# Patient Record
Sex: Male | Born: 1970 | Race: Black or African American | Hispanic: No | Marital: Married | Smoking: Former smoker
Health system: Southern US, Community
[De-identification: ages and names within clinical notes are randomized; demographics above are authoritative.]

## PROBLEM LIST (undated history)

## (undated) DIAGNOSIS — B182 Chronic viral hepatitis C: Secondary | ICD-10-CM

## (undated) DIAGNOSIS — I639 Cerebral infarction, unspecified: Secondary | ICD-10-CM

## (undated) DIAGNOSIS — K219 Gastro-esophageal reflux disease without esophagitis: Secondary | ICD-10-CM

## (undated) DIAGNOSIS — F1920 Other psychoactive substance dependence, uncomplicated: Secondary | ICD-10-CM

## (undated) DIAGNOSIS — E7800 Pure hypercholesterolemia, unspecified: Secondary | ICD-10-CM

## (undated) DIAGNOSIS — F319 Bipolar disorder, unspecified: Secondary | ICD-10-CM

## (undated) DIAGNOSIS — I10 Essential (primary) hypertension: Secondary | ICD-10-CM

## (undated) DIAGNOSIS — F419 Anxiety disorder, unspecified: Secondary | ICD-10-CM

## (undated) DIAGNOSIS — F1020 Alcohol dependence, uncomplicated: Secondary | ICD-10-CM

## (undated) HISTORY — DX: Bipolar disorder, unspecified: F31.9

## (undated) HISTORY — PX: ANEURYSM COILING: SHX5349

## (undated) HISTORY — DX: Alcohol dependence, uncomplicated: F10.20

## (undated) HISTORY — PX: APPENDECTOMY: SHX54

## (undated) HISTORY — DX: Other psychoactive substance dependence, uncomplicated: F19.20

## (undated) HISTORY — PX: LAPAROSCOPIC APPENDECTOMY: SUR753

## (undated) HISTORY — DX: Chronic viral hepatitis C: B18.2

## (undated) HISTORY — DX: Gastro-esophageal reflux disease without esophagitis: K21.9

## (undated) MED FILL — Fosaprepitant Dimeglumine For IV Infusion 150 MG (Base Eq): INTRAVENOUS | Qty: 5 | Status: AC

---

## 2002-11-23 ENCOUNTER — Emergency Department (HOSPITAL_COMMUNITY): Admission: EM | Admit: 2002-11-23 | Discharge: 2002-11-23 | Payer: Self-pay | Admitting: Emergency Medicine

## 2003-11-07 ENCOUNTER — Emergency Department (HOSPITAL_COMMUNITY): Admission: EM | Admit: 2003-11-07 | Discharge: 2003-11-07 | Payer: Self-pay | Admitting: Emergency Medicine

## 2006-03-24 ENCOUNTER — Inpatient Hospital Stay (HOSPITAL_COMMUNITY): Admission: AD | Admit: 2006-03-24 | Discharge: 2006-03-27 | Payer: Self-pay | Admitting: Neurosurgery

## 2006-03-29 ENCOUNTER — Inpatient Hospital Stay (HOSPITAL_COMMUNITY): Admission: EM | Admit: 2006-03-29 | Discharge: 2006-04-01 | Payer: Self-pay | Admitting: Emergency Medicine

## 2010-04-20 ENCOUNTER — Encounter: Payer: Self-pay | Admitting: Neurosurgery

## 2010-08-15 NOTE — Discharge Summary (Signed)
NAMETIMONTHY, HOVATER                ACCOUNT NO.:  [id number]   MEDICAL RECORD NO.:  [id number]          PATIENT TYPE:  INP   LOCATION:  3110                         FACILITY:  [facility]   PHYSICIAN:  Danae Orleans. Venetia Maxon, M.D.  DATE OF BIRTH:  November 25, 1970   DATE OF ADMISSION:  03/24/2006  DATE OF DISCHARGE:  03/27/2006                               DISCHARGE SUMMARY   REASON FOR ADMISSION:  Subarachnoid hemorrhage.   FINAL DIAGNOSIS:  Subarachnoid hemorrhage with angiographically negative  subarachnoid hemorrhage.   HISTORY OF ILLNESS:  [name] is a 40 year old man who has had  trouble with his blood pressure in the past.  He was seen at [facility] a couple of months ago secondary to increase in blood pressure  and headache.  He was seen by his medical doctor, Dr. Clelia Croft, and treated  with medications including clonidine and Diovan.  On the morning of  December 26, he awoke at around 11 a.m., was preparing to take his blood  pressure medicine when he had an acute onset of a headache with several  episodes of nausea and vomiting.  The patient called the emergency  services and was taken to [facility].  A CT scan demonstrated a  subarachnoid hemorrhage and a neurosurgical consultation was requested.  The patient was transferred to [facility] for  further neurosurgical management.  The CT scan revealed that he has  nontraumatic subarachnoid hemorrhage.  The CT scan demonstrated  subarachnoid blood mainly in the right sylvian fissure and some over the  convexity.  He underwent an angiogram, which was negative.  On December,  26, 2007 without evidence of aneurysm, AVM, dural AV fistula,  dissections or occlusions.  The patient was then observed in the neuro  ICU where he was doing well without significant headache complaints and  his blood pressure was well controlled.  He was discharged home on the  morning on March 27, 2006 after a head CT showed no  evidence of any  new hemorrhage and again, he was doing well.  He was on a Decadron taper  and was discharged with a prescription for Percocet #40 with  instructions to follow up for repeat angiogram on April 09, 2006.  He  is to be out of work for the next two weeks.      Danae Orleans. Venetia Maxon, M.D.  Electronically Signed     JDS/MEDQ  D:  03/27/2006  T:  03/28/2006  Job:  [id number]   cc:   Dr. Clelia Croft

## 2010-08-15 NOTE — H&P (Signed)
[name], COLL NO.:  [id number]   MEDICAL RECORD NO.:  [id number]          PATIENT TYPE:  INP   LOCATION:  3112                         FACILITY:  [facility]   PHYSICIAN:  Hewitt Shorts, M.D.DATE OF BIRTH:  06-19-1970   DATE OF ADMISSION:  03/29/2006  DATE OF DISCHARGE:                              HISTORY & PHYSICAL   HISTORY OF PRESENT ILLNESS:  The patient is a 40 year old black male who  returned to the [facility] emergency room by EMS this evening.  He had been admitted by Dr. Delma Officer in transfer from [facility] on March 24, 2006.  He is diagnosed with a  subretinal hemorrhage.  He presented with severe headache, and Dr.  Lovell Sheehan obtained an arteriogram that was performed by Dr. Corliss Skains.  Dr. Corliss Skains described that there is angiographically no evidence of  aneurysm, AVM, dural AV fistula dissection, or occlusions.  He felt the  venous outflow was within normal limits, and he felt there was mild  scattered atherosclerotic changes.  The patient remained neurologically  stable with only mild headache.  A repeat CT of the brain was performed,  and the patient was discharged on March 27, 2006, by Dr. Venetia Maxon with  plans for a repeat arteriogram on April 09, 2006.   However, earlier this evening, the patient was awakened from sleep.  He  felt a pop in his head and then the onset of pain, including  bifrontotemporal as well as vertex headache as well as nuchal pain.  This was associated with photophobia and occasional blurred vision;  however, the patient denies any focal weakness, diplopia, nausea, or  vomiting.   The patient was evaluated by Dr. Maryanna Shape, the emergency room  physician.  CT of the brain without contrast was obtained, and this  showed increased subretinal hemorrhage over the right frontal convexity,  essentially within the sulci of the right frontal lobe, without blood in  the area of the  basal cisterns.  The blood in the right frontal  convexity region is increased in appearance as compared to the CT done  on the 29th, indicating probable re-hemorrhage, although one could not  absolutely exclude differences in the CT technique.   The original CT scan from March 24, 2006, was performed at [facility].  It is unavailable to [place].   Neurosurgical consultation was requested by Dr. Weldon Inches.   PAST MEDICAL HISTORY:  Notable for history of hypertension for the past  10 to 15 years, treated by his primary physician, Dr. Sherryll Burger, in [place].  He  has been using Diovan/hydrochlorothiazide 160/12.5.  Dr. Venetia Maxon had given  him a new prescription for this, which the patient did fill.  He also  has a remote history of peptic ulcer disease 10 years ago.  It has not  been a recent problem.  He does not describe any history of myocardial  infarction, cancer, diabetes, or lung disease, and he does not report a  history of stroke prior to the difficulties this past week.   Previous surgeries include an appendectomy about  20 years ago.  He  denies allergies to medications.   MEDICATIONS:  At this time include:  1. Diovan/hydrochlorothiazide 160/12.5 one tablet q.a.m.  2. He was also given prescriptions for Percocet and Decadron by Dr.      Venetia Maxon at the time of discharge.   FAMILY HISTORY:  His father is aged 24 with Alzheimer's and has some  other medical conditions that he is unsure of.  Mother died at age 40  with lung cancer.  She also had diabetes and had a history of myocardial  infarction.   SOCIAL HISTORY:  The patient is married.  He has 6 children.  He lives  in [place].  He is employed in a Restaurant manager, [employer].  He smokes and  has smoked for 20 years; he has not smoked though, since his  hospitalization on the 26th.  He drinks beer about 3 times a week.  He  has not used any street drugs recently, but he used marijuana last about  4 months ago.   REVIEW  OF SYSTEMS:  Notable for symptoms described.   PAST MEDICAL HISTORY:  Otherwise unremarkable.   PHYSICAL EXAMINATION:  The patient is a well-developed, well-nourished  black male in no acute distress.  Temperature 97.5, pulse 56, blood pressure of 158/83.  LUNGS:  Clear to auscultation. Asymmetrical respiratory excursion.  HEART:  Sinus bradycardia but a normal S1 and S2.  There is no murmur.  ABDOMEN:  Soft, nondistended, bowel sounds present.  EXTREMITIES:  Show no clubbing, cyanosis, or edema.  NECK EXAMINATION:  1 to 2+ meningismus.  NEUROLOGICAL EXAMINATION:  Mental status:  The patient is awake, alert,  fully oriented.  His speech is fluent.  He has good comprehension.  Cranial nerves show pupils are equal, round, and reactive to light.  Extraocular movements are intact.  Facial movements symmetrical.  Hearing is present bilaterally.  Palate movement is symmetrical.  Shoulder shrug is symmetrical and tongue is midline.  Motor examination  shows 5/5 strength of the upper and lower extremities.  He has no drift  of the upper extremities.  Sensation is intact to pinprick throughout  the extremities.  Reflexes are trace to 1 in the biceps,  brachioradialis, triceps, quadriceps and gastrocnemiuou symmetrical.  Toes are downgoing bilaterally.  Gait and stance are not tested due to  his medical condition at this time.   IMPRESSION:  Subretinal hemorrhage, etiology uncertain.  Angio on  December 26 was negative.   PLAN:  The patient will be admitted to the neurosurgical intensive care  unit to the service of Dr. Lovell Sheehan.  Will continue neuro checks.  Laboratories have been requested, including a CBC with differential, PT,  PTT, CMET, urinalysis, urine drug screen, and typing screen.  Will  obtain MRI of the brain with and without contrast and an MRA of the  brain.  The patient will be restarted on Nimotop, which had been initiated by Dr. Lovell Sheehan at the time of admission on the 26th  but not  continued at the time of discharge on the 29th.  Decadron and Pepcid  will be reinitiated, and the patient will be continued on his  Diovan/hydrochlorothiazide.   I spoke with the patient and his wife about his condition and the  uncertainty of the cause of his hemorrhage and plans for ongoing workup  and evaluation.      Hewitt Shorts, M.D.  Electronically Signed     RWN/MEDQ  D:  03/29/2006  T:  03/29/2006  Job:  [id number]

## 2010-08-15 NOTE — Consult Note (Signed)
[name], [name]                ACCOUNT NO.:  [id number]   MEDICAL RECORD NO.:  [id number]          PATIENT TYPE:  INP   LOCATION:  3112                         FACILITY:  [facility]   PHYSICIAN:  Casimiro Needle L. Reynolds, M.D.DATE OF BIRTH:  1970-11-28   DATE OF CONSULTATION:  DATE OF DISCHARGE:                                 CONSULTATION   DATE OF EVALUATION:  March 29, 2006   REQUESTING PHYSICIAN:  Dr. Newell Coral   REASON FOR EVALUATION:  Possible vasculitis.   HISTORY OF PRESENT ILLNESS:  This is the initial inpatient consultation  evaluation of this 40 year old man with past medical history as outlined  below.  Patient was initially admitted to [facility] on  December 26 after presenting to [facility] with a headache and  blurry vision.  CT at that time demonstrated a subarachnoid hemorrhage  with blood in the right frontal area.  He was transferred to [facility] and underwent angiography which did not demonstrate aneurysm,  was basically otherwise negative.  He was observed for a couple of days,  remained stable and was discharged on December 29.  Unfortunately, I am  not able to recover any records from that hospitalization.  He was  readmitted in the early hours this morning.  He says that on the evening  of December 30, he awakened from a dream having a pop in his head and  having worsening of his headache and double vision just as he had a few  days prior.  In the emergency room, he underwent CT of the head which  showed an increase in the size of the blood over the right frontal  convexity as compared to the CT which was done on December 29.  Later  today, he had an MRI of the brain which showed the same finding and also  demonstrated some new subarachnoid blood over the occipital areas.  MRA  performed at that time was read out as showing some narrowings in the  MCA and PCA branches raising the possibility of atherosclerotic disease  versus  vasculitis.  There were no abnormalities in the brain parenchyma.  Neurologic consultation was requested for consideration of possible  vasculitis.  Patient says that his symptoms which were severe this  morning are gradually improving again, he still has a little bit of a  headache and double vision.  He says that he was generally weak, but  denied having any focal weakness at any point.  He knows that his speech  was slurred, but that seems to be getting better.  On specific  questioning, he does report that he has had intermittent rashes which he  had  described previously to working around chemicals, but over the past  couple of weeks he had a specific rash across the bridge of his nose.  He also endorses having other symptoms at various times including  swelling and redness of various joints including most recently the knees  a couple of weeks ago as well as bouts of hemoptysis and hematochezia.  He states that he has seen his primary care  doctor in [facility]  about this, but he has never been referred to a rheumatologist.   PAST MEDICAL HISTORY:  1. He denies chronic medical problems.  2. He says at one point there was concern he might have meningitis.  3. He was treated for pneumonia in the past as well.  4. He has known hypertension for which he is on medications.  5. He had an appendectomy 22 years ago.   FAMILY/SOCIAL/REVIEW OF SYSTEMS:  As outlined in the admission H&P of  March 29, 2006 by Dr. Newell Coral which is reviewed.   PHYSICAL EXAMINATION:  VITAL SIGNS:  Temperature 97.5, blood pressure  158/83, pulse 56, respirations 16.  GENERAL EXAMINATION:  This is a  healthy-appearing man seated in no evident distress.  HEAD:  Cranium is normocephalic, atraumatic.  OROPHARYNX:  Benign.  NECK:  Supple without carotid or supraclavicular bruits.  HEART:  Regular rate without murmurs.  EXTREMITIES:  No rashes are noted, no swelling or redness of the joints  are noted.   He does have a couple of small subcutaneous nodules over the  extensor surface of the right forearm.  NEUROLOGIC EXAM:  Mental status:  He is awake and alert, he is fully  oriented to time and place.  Recent and remote memory are intact per  history given attention span, concentration and fund of knowledge are  all adequate.  He has no defects to naming or repeating phrases.  Cranial nerves:  Funduscopic exam is benign.  Pupils are equal, brisk  and reactive.  Extraocular movements full without nystagmus.  Visual  fields full to confrontation.  Hearing is intact to conversational  speech.  Face, tongue and palate move normally and symmetrical.  Motor:  Normal bulk and tone.  Normal strength in all tested extremity muscles.  Sensation:  Intact to light touch throughout.  Coordination and rapid  movements performed adequately.  Finger-to-nose performed adequate.  Gait:  He failed to ambulate normally.  Reflexes 2+ and symmetric.  Toes  were downgoing bilaterally.   LABORATORY REVIEW:  MRI of the brain performed earlier today is  reviewed, study demonstrates normal brain parenchyma with some suspected  narrowing of branches of MCA and PCA territories.  I also briefly  reviewed the angiogram performed on March 24, 2006 which is negative  for aneurysms or major vessel occlusions and to my eyes not highly  suggestive of vasculitis.   IMPRESSION:  Multiple subarachnoid hemorrhages without aneurysmal  source.  He had a recent catheter angiogram which was not highly  suggestive of  vasculitis.  His MRI does not show multiple strokes or  ischemia which would be expected in a patient with vasculitis and the MR  angiogram which he had to take can overcall these findings.  On the  other hand, he does endorse several symptoms including intermittent  rashes, arthritis and hemoptysis which suggest that he might, in fact,  have a vasculitic disorder, particularly one affecting the adventitia of the  vessels could present with multiple subarachnoid hemorrhages of this  nature.   RECOMMENDATIONS:  Would suggest laboratory workup including  sedimentation rate, C-reactive protein, ANA, rheumatoid factor and C and  P-ANCA antibodies.  Would also suggest a rheumatology evaluation, as  given his multiple systemic symptoms it is likely that if he does have a  CNS vasculitis, it is secondary to a more diffuse vasculitic disorder.  Pending the outcome of the above, other considerations could be  undertaken including a repeat angiogram which if  it confirms the MRA  findings would be very suggestive of vasculitis, possibly LP or biopsy  of appropriate tissue if other considerations are undertaken.  I do not  think that a brain biopsy is indicated at this time and that would  likely be the only way to absolute confirm the diagnosis of CNS  vasculitis.  I will follow with you.   Thank you for the consultation.      Michael L. Thad Ranger, M.D.  Electronically Signed     MLR/MEDQ  D:  03/29/2006  T:  03/30/2006  Job:  [id number]

## 2010-08-15 NOTE — H&P (Signed)
[name], [name]                ACCOUNT NO.:  [id number]   MEDICAL RECORD NO.:  [id number]          PATIENT TYPE:  INP   LOCATION:  3110                         FACILITY:  [facility]   PHYSICIAN:  Cristi Loron, M.D.DATE OF BIRTH:  [birth year]   DATE OF ADMISSION:  03/24/2006  DATE OF DISCHARGE:                              HISTORY & PHYSICAL   CHIEF COMPLAINT:  Headache.   HISTORY OF PRESENT ILLNESS:  The patient is a 40 year old black male,  who had some trouble with his blood pressure in the past.  He was seen  at the [facility] Emergency Department a couple of months ago  secondary to a increase in blood pressure and headache.  He has been  seen by his medical doctor, Dr. Clelia Croft, and treated with medications  including clonidine and Diovan.   This morning, the patient woke up around 11:00.  He was preparing to  take his blood pressure medicine when he had an acute onset of a  headache with several episodes of nausea and vomiting.  The patient had  called EMS.  He was taken to [facility] where he was  evaluated by the emergency room staff there.  Evaluation included a  cranial CT scan which demonstrated a subarachnoid hemorrhage and a  neurosurgical was requested.  The patient was subsequently transported  to [facility] for further neurosurgical  management.   Presently, the patient complains of headache, some neck pain.  He denies  any recent trauma.   PAST MEDICAL HISTORY:  Positive for hypertension as above,  hypercholesterolemia, remote history of appendicitis.   PAST SURGICAL HISTORY:  Appendectomy.   MEDICATIONS PRIOR TO ADMISSION:  1. Diovan 160/12.5, 1 p.o. daily.  2. The patient states he was instructed to use clonidine 0.1 mg p.o.      b.i.d. if he ran out of his Diovan.  He does admit that he took the      clonidine just prior to developing this headache.   FAMILY HISTORY:  The patient's mother died in 39 with  lung cancer.  The patient's father is 51, he has Alzheimer's disease.   SOCIAL HISTORY:  The patient is married.  He has 6 children.  He lives  in [place].  He is employed [employer].  He admits to smoking  one half pack per day of cigarettes x20 years, I advised him to quit.  He occasionally drinks alcohol, denies drug use.   REVIEW OF SYSTEMS:  Negative except as above.   PHYSICAL EXAM:  GENERAL:  A pleasant 40 year old black male in no  apparent distress.  HEENT:  Normocephalic, atraumatic.  His pupils are equal, round, and  reactive to light.  Extraocular muscles intact.  Oropharynx:  Benign.  NECK:  Supple, there is no masses, deformities, trachea deviation.  He  has a mildly limited cervical range of motion.  Meningismus is present,  Spurling test is negative, Lhermitte sign was present.  THORAX:  Symmetric.  LUNGS:  Clear.  HEART:  Regular rate and rhythm.  ABDOMEN:  Soft, nontender.  EXTREMITIES:  No obvious deformities.  BACK EXAM:  Normal.  NEUROLOGIC EXAM:  The patient is alert and oriented x3.  Cranial nerves  II-XII are examined and bilaterally grossly normal.  Vision and hearing  are grossly normal bilaterally.  Motor strength is 5/5 and his bilateral  biceps, triceps, deltoids, hand grip, psoas, quadriceps, gastrocnemius,  expansion of his lungs, deep tendon reflexes are 2/4 as well as biceps,  triceps, quadriceps, and gastrocnemius.  There is no ankle clonus.  Cerebellar function is intact to rapid alternating movements in the  upper extremities bilaterally.  Sensory function is in to light touch  and sensation to all dermatomes bilaterally.   I reviewed the patient's head CT performed without contrast at [facility] on March 24, 2006, which demonstrates he has  subarachnoid blood mainly in the right sylvian fissure; however, some  overt convexities and even some on the left side.  He does not have much  blood in the nasal cistern's,  no hydrocephalus.   ASSESSMENT AND PLAN:  Nontraumatic subarachnoid hemorrhage.  I have  discussed these issues with the patient and his wife.  I have explained  that there has been some bleeding inside his skull and that we need to  figure out where this came from.  One of the most common causes, in the  absence of trauma, is an aneurysm.  I recommend he get an cerebral  arteriogram.  I have answered all the patient's questions.  He wants to  proceed with that study.   In the meantime, we will start him on Decadron, lamotrigine and control  his blood pressure.      Cristi Loron, M.D.  Electronically Signed     JDJ/MEDQ  D:  03/24/2006  T:  03/25/2006  Job:  [id number]

## 2010-08-15 NOTE — Discharge Summary (Signed)
NAMETIMOTH, SCHARA NO.:  [id number]   MEDICAL RECORD NO.:  [id number]          PATIENT TYPE:  INP   LOCATION:  3112                         FACILITY:  [facility]   PHYSICIAN:  Cristi Loron, M.D.DATE OF BIRTH:  06-Jul-1970   DATE OF ADMISSION:  03/29/2006  DATE OF DISCHARGE:  04/01/2006                               DISCHARGE SUMMARY   BRIEF HISTORY:  The patient is a 40 year old black male who had acute  onset of headache on March 24, 2006.  He presented to [facility]  Emergency Department where he was evaluated and a cranial CT scan  demonstrated a subarachnoid hemorrhage.  The patient was transferred to  [facility] for further neurosurgical management.   I admitted the patient on March 24, 2006.  At that time his diagnosis  was a non-traumatic subarachnoid hemorrhage.  I recommended that he have  a stat cerebral arteriogram.  This was done and was negative for another  source of bleeding, such and aneurysm, ABM, etc.   The patient was observed a couple of days and then subsequently  discharged with arrangements made to get a repeat cerebral arteriogram  on April 09, 2006.   While home on March 29, 2006, the patient awoke from sleep with a pop  in his head, had increasing headache and the patient came back to the  Emergency Department and was evaluated by the Emergency physician and a  CT demonstrated there may be some increasing subarachnoid hemorrhage and  the patient was readmitted by Dr. Newell Coral in my absence.   For further details of this admission, please refer to the typed history  and physical.   HOSPITAL COURSE:  The patient was observed in the ICU.  A brain MRI was  obtained.  It demonstrated he has a subarachnoid hemorrhage, as well as  some focal areas of stenoses seen on his MRI, possibly consistent with  vasculitis.  We had the neurologist see the patient and they ordered his  blood work, including SED rate, rheumatoid  factor, C-reactive protein,  ANA, all of which were normal.   During this hospitalization, the patient was neurologically stable.  He  was alert and oriented.  His strength was normal.  The patient and his  family requested a transfer to [facility]  yesterday, i.e. on March 31, 2005.  At that time, I told them that if  it is necessary, that we should continue to work up here.  By today  they were adamant about the transfer to [facility] and demanded  this transfer be done.  I spoke to Dr. Christell Constant.  I called the PAL line to  speak with Dr. Mikel Cella, but he was unavailable, so I spoke to Dr.  Vira Browns, who is the medical ICU attending who generally admits the  intracranial bleeds that do not need immediate surgery.  I explained the  situation to her and that the request for transfer was being  initiated/demanded by the patient and the family, and she has kindly  accepted the patient to transfer.   I have  answered all the patient's questions.  He understands the risks  of transfer and he will have further care and follow-up at [facility] as above.   FINAL DIAGNOSIS:  Subarachnoid hemorrhage.   PROCEDURE PERFORMED:  None.   DISCHARGE INSTRUCTIONS:  As above.  The patient was instructed to  receive his follow-up care at [facility] as per  their directions.      Cristi Loron, M.D.  Electronically Signed     JDJ/MEDQ  D:  04/01/2006  T:  04/01/2006  Job:  [id number]

## 2016-11-04 ENCOUNTER — Inpatient Hospital Stay (HOSPITAL_COMMUNITY)
Admission: RE | Admit: 2016-11-04 | Discharge: 2016-11-10 | DRG: 881 | Disposition: A | Discharge status: Home / Self Care | Payer: BLUE CROSS/BLUE SHIELD | Attending: Psychiatry | Admitting: Psychiatry

## 2016-11-04 DIAGNOSIS — F329 Major depressive disorder, single episode, unspecified: Principal | ICD-10-CM | POA: Diagnosis present

## 2016-11-04 DIAGNOSIS — F17210 Nicotine dependence, cigarettes, uncomplicated: Secondary | ICD-10-CM | POA: Diagnosis present

## 2016-11-04 DIAGNOSIS — I10 Essential (primary) hypertension: Secondary | ICD-10-CM | POA: Diagnosis present

## 2016-11-04 DIAGNOSIS — R45851 Suicidal ideations: Secondary | ICD-10-CM | POA: Diagnosis present

## 2016-11-04 DIAGNOSIS — F332 Major depressive disorder, recurrent severe without psychotic features: Secondary | ICD-10-CM | POA: Diagnosis not present

## 2016-11-04 DIAGNOSIS — F1490 Cocaine use, unspecified, uncomplicated: Secondary | ICD-10-CM | POA: Diagnosis present

## 2016-11-04 DIAGNOSIS — F1410 Cocaine abuse, uncomplicated: Secondary | ICD-10-CM | POA: Diagnosis not present

## 2016-11-04 DIAGNOSIS — F1024 Alcohol dependence with alcohol-induced mood disorder: Secondary | ICD-10-CM | POA: Diagnosis present

## 2016-11-04 DIAGNOSIS — F419 Anxiety disorder, unspecified: Secondary | ICD-10-CM | POA: Diagnosis present

## 2016-11-04 DIAGNOSIS — F1020 Alcohol dependence, uncomplicated: Secondary | ICD-10-CM

## 2016-11-04 DIAGNOSIS — G4700 Insomnia, unspecified: Secondary | ICD-10-CM | POA: Diagnosis present

## 2016-11-04 DIAGNOSIS — K219 Gastro-esophageal reflux disease without esophagitis: Secondary | ICD-10-CM | POA: Diagnosis present

## 2016-11-04 DIAGNOSIS — F1994 Other psychoactive substance use, unspecified with psychoactive substance-induced mood disorder: Secondary | ICD-10-CM | POA: Diagnosis not present

## 2016-11-05 ENCOUNTER — Encounter (HOSPITAL_COMMUNITY): Payer: Self-pay | Admitting: *Deleted

## 2016-11-05 DIAGNOSIS — F419 Anxiety disorder, unspecified: Secondary | ICD-10-CM

## 2016-11-05 DIAGNOSIS — F17210 Nicotine dependence, cigarettes, uncomplicated: Secondary | ICD-10-CM

## 2016-11-05 DIAGNOSIS — F1994 Other psychoactive substance use, unspecified with psychoactive substance-induced mood disorder: Secondary | ICD-10-CM | POA: Diagnosis present

## 2016-11-05 DIAGNOSIS — R45851 Suicidal ideations: Secondary | ICD-10-CM

## 2016-11-05 DIAGNOSIS — F1024 Alcohol dependence with alcohol-induced mood disorder: Secondary | ICD-10-CM

## 2016-11-05 DIAGNOSIS — F1410 Cocaine abuse, uncomplicated: Secondary | ICD-10-CM

## 2016-11-05 DIAGNOSIS — G4700 Insomnia, unspecified: Secondary | ICD-10-CM

## 2016-11-05 DIAGNOSIS — F1020 Alcohol dependence, uncomplicated: Secondary | ICD-10-CM

## 2016-11-05 HISTORY — DX: Other psychoactive substance use, unspecified with psychoactive substance-induced mood disorder: F19.94

## 2016-11-05 LAB — CBC WITH DIFFERENTIAL/PLATELET
BASOS ABS: 0 10*3/uL (ref 0.0–0.1)
Basophils Relative: 0 %
Eosinophils Absolute: 0.1 10*3/uL (ref 0.0–0.7)
Eosinophils Relative: 1 %
HEMATOCRIT: 41.7 % (ref 39.0–52.0)
HEMOGLOBIN: 14.3 g/dL (ref 13.0–17.0)
LYMPHS PCT: 37 %
Lymphs Abs: 2.5 10*3/uL (ref 0.7–4.0)
MCH: 30 pg (ref 26.0–34.0)
MCHC: 34.3 g/dL (ref 30.0–36.0)
MCV: 87.4 fL (ref 78.0–100.0)
MONO ABS: 0.9 10*3/uL (ref 0.1–1.0)
Monocytes Relative: 13 %
NEUTROS ABS: 3.2 10*3/uL (ref 1.7–7.7)
Neutrophils Relative %: 49 %
Platelets: 144 10*3/uL — ABNORMAL LOW (ref 150–400)
RBC: 4.77 MIL/uL (ref 4.22–5.81)
RDW: 13.3 % (ref 11.5–15.5)
WBC: 6.6 10*3/uL (ref 4.0–10.5)

## 2016-11-05 LAB — LIPID PANEL
Cholesterol: 123 mg/dL (ref 0–200)
HDL: 46 mg/dL (ref >40)
LDL Cholesterol: 37 mg/dL (ref 0–99)
Total CHOL/HDL Ratio: 2.7 RATIO
Triglycerides: 200 mg/dL — ABNORMAL HIGH (ref <150)
VLDL: 40 mg/dL (ref 0–40)

## 2016-11-05 LAB — COMPREHENSIVE METABOLIC PANEL
ALBUMIN: 4.1 g/dL (ref 3.5–5.0)
ALT: 144 U/L — ABNORMAL HIGH (ref 17–63)
AST: 120 U/L — AB (ref 15–41)
Alkaline Phosphatase: 69 U/L (ref 38–126)
Anion gap: 9 (ref 5–15)
BUN: 10 mg/dL (ref 6–20)
CHLORIDE: 99 mmol/L — AB (ref 101–111)
CO2: 28 mmol/L (ref 22–32)
Calcium: 9.1 mg/dL (ref 8.9–10.3)
Creatinine, Ser: 1 mg/dL (ref 0.61–1.24)
GFR calc Af Amer: >60 mL/min (ref >60)
GFR calc non Af Amer: >60 mL/min (ref >60)
GLUCOSE: 127 mg/dL — AB (ref 65–99)
Potassium: 3.7 mmol/L (ref 3.5–5.1)
Sodium: 136 mmol/L (ref 135–145)
Total Bilirubin: 1.3 mg/dL — ABNORMAL HIGH (ref 0.3–1.2)
Total Protein: 8.7 g/dL — ABNORMAL HIGH (ref 6.5–8.1)

## 2016-11-05 LAB — CBC
HCT: 40.4 % (ref 39.0–52.0)
Hemoglobin: 14 g/dL (ref 13.0–17.0)
MCH: 30 pg (ref 26.0–34.0)
MCHC: 34.7 g/dL (ref 30.0–36.0)
MCV: 86.7 fL (ref 78.0–100.0)
PLATELETS: 146 10*3/uL — AB (ref 150–400)
RBC: 4.66 MIL/uL (ref 4.22–5.81)
RDW: 13.4 % (ref 11.5–15.5)
WBC: 9.1 10*3/uL (ref 4.0–10.5)

## 2016-11-05 LAB — RAPID URINE DRUG SCREEN, HOSP PERFORMED
AMPHETAMINES: NOT DETECTED
BENZODIAZEPINES: NOT DETECTED
Barbiturates: NOT DETECTED
Cocaine: POSITIVE — AB
OPIATES: NOT DETECTED
Tetrahydrocannabinol: NOT DETECTED

## 2016-11-05 LAB — HEPATIC FUNCTION PANEL
ALBUMIN: 4.1 g/dL (ref 3.5–5.0)
ALT: 143 U/L — ABNORMAL HIGH (ref 17–63)
AST: 120 U/L — ABNORMAL HIGH (ref 15–41)
Alkaline Phosphatase: 67 U/L (ref 38–126)
BILIRUBIN TOTAL: 1.1 mg/dL (ref 0.3–1.2)
Bilirubin, Direct: 0.2 mg/dL (ref 0.1–0.5)
Indirect Bilirubin: 0.9 mg/dL (ref 0.3–0.9)
TOTAL PROTEIN: 8.7 g/dL — AB (ref 6.5–8.1)

## 2016-11-05 LAB — ETHANOL: Alcohol, Ethyl (B): <5 mg/dL (ref <5)

## 2016-11-05 LAB — TSH: TSH: 1.707 u[IU]/mL (ref 0.350–4.500)

## 2016-11-05 LAB — HEMOGLOBIN A1C
Hgb A1c MFr Bld: 5.6 % (ref 4.8–5.6)
MEAN PLASMA GLUCOSE: 114.02 mg/dL

## 2016-11-05 MED ORDER — LORAZEPAM 1 MG PO TABS
1.0000 mg | ORAL_TABLET | Freq: Two times a day (BID) | ORAL | Status: AC
  Administered 2016-11-07 (×2): 1 mg via ORAL
  Filled 2016-11-05 (×2): qty 1

## 2016-11-05 MED ORDER — LORAZEPAM 1 MG PO TABS
1.0000 mg | ORAL_TABLET | Freq: Every day | ORAL | Status: AC
  Administered 2016-11-08: 1 mg via ORAL
  Filled 2016-11-05: qty 1

## 2016-11-05 MED ORDER — ONDANSETRON 4 MG PO TBDP: 4.0000 mg | ORAL_TABLET | Freq: Four times a day (QID) | ORAL | Status: AC | PRN

## 2016-11-05 MED ORDER — MAGNESIUM HYDROXIDE 400 MG/5ML PO SUSP: 30.0000 mL | Freq: Every day | ORAL | Status: DC | PRN

## 2016-11-05 MED ORDER — TRAZODONE HCL 50 MG PO TABS
50.0000 mg | ORAL_TABLET | Freq: Every evening | ORAL | Status: DC | PRN
  Administered 2016-11-05 – 2016-11-08 (×4): 50 mg via ORAL
  Filled 2016-11-05 (×4): qty 1

## 2016-11-05 MED ORDER — HYDROXYZINE HCL 25 MG PO TABS
25.0000 mg | ORAL_TABLET | Freq: Four times a day (QID) | ORAL | Status: AC | PRN
  Administered 2016-11-05 – 2016-11-07 (×2): 25 mg via ORAL
  Filled 2016-11-05 (×2): qty 1

## 2016-11-05 MED ORDER — IBUPROFEN 600 MG PO TABS
600.0000 mg | ORAL_TABLET | Freq: Once | ORAL | Status: AC
  Administered 2016-11-05: 600 mg via ORAL
  Filled 2016-11-05 (×2): qty 1

## 2016-11-05 MED ORDER — NICOTINE POLACRILEX 2 MG MT GUM
2.0000 mg | CHEWING_GUM | OROMUCOSAL | Status: DC | PRN
  Administered 2016-11-06 (×2): 2 mg via ORAL
  Filled 2016-11-05: qty 1
  Filled 2016-11-05: qty 5

## 2016-11-05 MED ORDER — THIAMINE HCL 100 MG/ML IJ SOLN: 100.0000 mg | Freq: Once | INTRAMUSCULAR | Status: DC

## 2016-11-05 MED ORDER — LORAZEPAM 1 MG PO TABS: 1.0000 mg | ORAL_TABLET | Freq: Four times a day (QID) | ORAL | Status: AC | PRN

## 2016-11-05 MED ORDER — VITAMIN B-1 100 MG PO TABS
100.0000 mg | ORAL_TABLET | Freq: Every day | ORAL | Status: DC
  Administered 2016-11-06 – 2016-11-10 (×5): 100 mg via ORAL
  Filled 2016-11-05 (×7): qty 1

## 2016-11-05 MED ORDER — TRAZODONE HCL 50 MG PO TABS
50.0000 mg | ORAL_TABLET | Freq: Every evening | ORAL | Status: DC | PRN
  Filled 2016-11-05 (×4): qty 1

## 2016-11-05 MED ORDER — LORAZEPAM 1 MG PO TABS
1.0000 mg | ORAL_TABLET | Freq: Four times a day (QID) | ORAL | Status: AC
Start: 2016-11-05 — End: 2016-11-05
  Administered 2016-11-05 (×4): 1 mg via ORAL
  Filled 2016-11-05 (×3): qty 1

## 2016-11-05 MED ORDER — ALUM & MAG HYDROXIDE-SIMETH 200-200-20 MG/5ML PO SUSP: 30.0000 mL | ORAL | Status: DC | PRN

## 2016-11-05 MED ORDER — OLANZAPINE 5 MG PO TABS
5.0000 mg | ORAL_TABLET | Freq: Every day | ORAL | Status: DC
Start: 2016-11-05 — End: 2016-11-05
  Filled 2016-11-05 (×3): qty 1

## 2016-11-05 MED ORDER — ADULT MULTIVITAMIN W/MINERALS CH
1.0000 | ORAL_TABLET | Freq: Every day | ORAL | Status: DC
  Administered 2016-11-05 – 2016-11-10 (×6): 1 via ORAL
  Filled 2016-11-05 (×9): qty 1

## 2016-11-05 MED ORDER — LOPERAMIDE HCL 2 MG PO CAPS
2.0000 mg | ORAL_CAPSULE | ORAL | Status: AC | PRN
  Administered 2016-11-05: 2 mg via ORAL
  Administered 2016-11-05: 4 mg via ORAL
  Administered 2016-11-05: 2 mg via ORAL
  Administered 2016-11-06: 4 mg via ORAL
  Administered 2016-11-06 – 2016-11-07 (×2): 2 mg via ORAL
  Administered 2016-11-07: 4 mg via ORAL
  Filled 2016-11-05: qty 2
  Filled 2016-11-05 (×3): qty 1
  Filled 2016-11-05: qty 2
  Filled 2016-11-05: qty 1
  Filled 2016-11-05: qty 2

## 2016-11-05 MED ORDER — LORAZEPAM 1 MG PO TABS
1.0000 mg | ORAL_TABLET | Freq: Three times a day (TID) | ORAL | Status: AC
  Administered 2016-11-06 (×3): 1 mg via ORAL
  Filled 2016-11-05 (×3): qty 1

## 2016-11-05 NOTE — Plan of Care (Signed)
Problem: Safety: Goal: Periods of time without injury will increase Outcome: Progressing Pt remains a moderate fall risk, denies SI, Q 15 checks in effect.

## 2016-11-05 NOTE — BHH Suicide Risk Assessment (Addendum)
[facility] Admission Suicide Risk Assessment   Nursing information obtained from:  Patient Demographic factors:  Male, Access to firearms Current Mental Status:  Suicidal ideation indicated by patient, Suicide plan, Plan includes specific time, place, or method, Self-harm thoughts, Self-harm behaviors, Intention to act on suicide plan Loss Factors:  Loss of significant relationship, Decline in physical health Historical Factors:  Family history of mental illness or substance abuse, Impulsivity Risk Reduction Factors:  Employed, Living with another person, especially a relative, Positive social support  Total Time spent with patient: 45 minutes Principal Problem: Depression, Substance Abuse  Diagnosis:   Patient Active Problem List   Diagnosis Date Noted  . Substance induced mood disorder ([place]) [F19.94] 11/05/2016     Continued Clinical Symptoms:  Alcohol Use Disorder Identification Test Final Score (AUDIT): 22 The "Alcohol Use Disorders Identification Test", Guidelines for Use in Primary Care, Second Edition.  World Pharmacologist [facility]). Score between 0-7:  no or low risk or alcohol related problems. Score between 8-15:  moderate risk of alcohol related problems. Score between 16-19:  high risk of alcohol related problems. Score 20 or above:  warrants further diagnostic evaluation for alcohol dependence and treatment.   CLINICAL FACTORS:  46 year old married male, has 6 children, all adults. Lives with wife. Currently unemployed.   Presented with worsening depression, sadness, suicidal ideations with thoughts of shooting self . States " I have been feeling bad for months now". Reports neuro-vegetative symptoms of depression, including poor sleep, poor appetite, poor energy level, suicidal ideations,anhedonia. Denies hallucinations. He reports history of alcohol and drug abuse , and states he has been drinking daily, heavily, up to 18 per day. He has also been using crack cocaine  regularly.   Medical history is remarkable for HTN- no history of seizures .  Was not taking any psychiatric medications prior to admission  Dx- Alcohol Dependence, Cocaine Dependence, Alcohol Induced Mood Disorder   Plan- inpatient admission, started on Ativan detox protocol to minimize risk of alcohol WDL.  *Patient presents with low grade fever which may be related to ETOH withdrawal.  WBC 9.1 Will monitor and repeat CBC   Musculoskeletal: Strength & Muscle Tone: within normal limits no diaphoresis, mild distal tremors, no acute distress , has low grade fever  Gait & Station: normal Patient leans: N/A  Psychiatric Specialty Exam: Physical Exam  ROS reports headaches, no chest pain, no shortness of breath, no coughing , no vomiting, (+) diarrhea, reports diffuse aches   Blood pressure (!) 153/98, pulse 71, temperature 100.1 F (37.8 C), temperature source Oral, resp. rate 20, height 5\' 3"  (1.6 m), weight 57.2 kg (126 lb).Body mass index is 22.32 kg/m.  General Appearance: Fairly Groomed  Eye Contact:  Fair  Speech:  Normal Rate  Volume:  Decreased  Mood:  Depressed and Dysphoric  Affect:  Constricted  Thought Process:  Linear and Descriptions of Associations: Intact  Orientation:  Full (Time, Place, and Person)  Thought Content:  denies hallucinations,no delusions , not internally preoccupied   Suicidal Thoughts:  No denies current suicidal or self injurious ideations, contracts for safety on unit   Homicidal Thoughts:  No denies homicidal or violent ideations  Memory:  recent and remote grossly intact   Judgement:  Fair  Insight:  Fair  Psychomotor Activity:  Normal no psychomotor agitation or restlessness, mild distal tremors   Concentration:  Concentration: Fair and Attention Span: Fair  Recall:  [employer] of Knowledge:  Fair  LanguageKermit Balo  Akathisia:  Negative  Handed:  Right  AIMS (if indicated):     Assets:  Communication Skills Desire for  Improvement Resilience  ADL's:  Fair   Cognition:  WNL  Sleep:  Number of Hours: 3.5 (late night admission)      COGNITIVE FEATURES THAT CONTRIBUTE TO RISK:  Closed-mindedness and Loss of executive function    SUICIDE RISK:   Moderate:  Frequent suicidal ideation with limited intensity, and duration, some specificity in terms of plans, no associated intent, good self-control, limited dysphoria/symptomatology, some risk factors present, and identifiable protective factors, including available and accessible social support.  PLAN OF CARE: Patient will be admitted to inpatient psychiatric unit for stabilization and safety. Will provide and encourage milieu participation. Provide medication management and maked adjustments as needed.  Will also provide medication to minimize risk of alcohol withdrawal. Will follow daily.    I certify that inpatient services furnished can reasonably be expected to improve the patient's condition.   Jenne Campus, MD 11/05/2016, 3:51 PM

## 2016-11-05 NOTE — Tx Team (Signed)
Initial Treatment Plan 11/05/2016 1:55 AM [name] CHY:[id number]    PATIENT STRESSORS: Marital or family conflict Substance abuse   PATIENT STRENGTHS: Ability for insight Average or above average intelligence Communication skills Motivation for treatment/growth Supportive family/friends   PATIENT IDENTIFIED PROBLEMS: At risk for suicide  Substance Abuse  "I don't want to have this addiction anymore"  "I just want to live"               DISCHARGE CRITERIA:  Ability to meet basic life and health needs Improved stabilization in mood, thinking, and/or behavior Motivation to continue treatment in a less acute level of care Need for constant or close observation no longer present Verbal commitment to aftercare and medication compliance  PRELIMINARY DISCHARGE PLAN: Attend 12-step recovery group Outpatient therapy Return to previous living arrangement Return to previous work or school arrangements  PATIENT/FAMILY INVOLVEMENT: This treatment plan has been presented to and reviewed with the patient, [name].  The patient and family have been given the opportunity to ask questions and make suggestions.  Dustin Flock, RN 11/05/2016, 1:55 AM

## 2016-11-05 NOTE — BHH Counselor (Signed)
CSW made 2 attempts to meet with pt today in order to complete psychosocial assessment. Pt remains in bed; sleeping with covers over his head. Attempt will be made in the morning.  Maxie Better, MSW, LCSW Clinical Social Worker 11/05/2016 2:59 PM

## 2016-11-05 NOTE — BHH Suicide Risk Assessment (Signed)
[place] INPATIENT:  Family/Significant Other Suicide Prevention Education  Suicide Prevention Education:  Education Completed; Aldahir Litaker (pt's wife) [phone] has been identified by the patient as the family member/significant other with whom the patient will be residing, and identified as the person(s) who will aid the patient in the event of a mental health crisis (suicidal ideations/suicide attempt).  With written consent from the patient, the family member/significant other has been provided the following suicide prevention education, prior to the and/or following the discharge of the patient.  The suicide prevention education provided includes the following:  Suicide risk factors  Suicide prevention and interventions  National Suicide Hotline telephone number  [facility] assessment telephone number  [facility] Emergency Assistance [place] and/or Residential Mobile Crisis Unit telephone number  Request made of family/significant other to:  Remove weapons (e.g., guns, rifles, knives), all items previously/currently identified as safety concern.    Remove drugs/medications (over-the-counter, prescriptions, illicit drugs), all items previously/currently identified as a safety concern.  The family member/significant other verbalizes understanding of the suicide prevention education information provided.  The family member/significant other agrees to remove the items of safety concern listed above.  Takoda Siedlecki N Smart LCSW 11/05/2016, 11:05 AM

## 2016-11-05 NOTE — Progress Notes (Signed)
Admission Note:  46 year old male who presents with a walk-in, in no acute distress, for the treatment of SI and Substance abuse.  Patient reports "I had a gun in my mouth getting ready to end it all". Patient reports that his daughter walked in on patient and prevented him from pulling the trigger.  Patient reports "rock cocaine" use for 30 years and reports drinking "at least a case of beer" daily.  Patient appears flat, sad, depressed, and tearful on admission. Patient was calm and cooperative with admission process. Patient presents with passive SI and contracts for safety upon admission. Patient denies AVH. Patient identifies stressors as "my kids are not where we expect them to be" and multiple recent family deaths to include his 3 Siblings and his mother from September 2017-March 2018.  Patient currently lives with his wife and three children and identify them as his support system.  While at [facility], patient would like to work on "I don't want to have this addiction anymore" and "I just want to live".  Skin was assessed and found to be clear of any abnormal marks apart from a scar on right Calf. Patient searched and no contraband found, POC and unit policies explained and understanding verbalized. Consents obtained. Food and fluids offered and accepted. Patient had no additional questions or concerns.

## 2016-11-05 NOTE — Progress Notes (Signed)
D) Pt. Has c/o general malaise, body aches, and diarrhea today.  Affect and mood appear blunted and depressed.  HTN noted.  A) Continues on Ativan protocol and has been compliant with medication.  Pt. Appetite reported limited.  Pt. Encouraged to eat bland food and to avoid high fat which may irritate stomach.  Pt. Offered supportive measures and hesitated to request any additional medication for body aches. Encouraged to report detailed symptoms to staff in order for staff to address issues. Spoke with pt's wife this evening and offered her support and information while pt. Was present.  R) Pt. Receptive and cooperative with care. Remains safe at this time.

## 2016-11-05 NOTE — Progress Notes (Signed)
  DATA ACTION RESPONSE  Objective- Pt. is visible in the room, seen resting in bed with eyes open. Pt appears restless. Presents with a depressed/flat     affect and mood. C/o of anxiety, generalized pain, insomnia, and diarrhea this evening. Provider on call notified to obtain 1x order for Ibuprofen per Pt request.  Subjective- Denies having any SI/HI/AVH at this time. Rates pain 8/10; generalized. Pt. states "I am finally feeling it". Is cooperative and remain safe on the unit.  1:1 interaction in private to establish rapport. Encouragement, education, & support given from staff.  PRN Imodium,Vistaril, and Trazodone requested and will re-eval accordingly.   Safety maintained with Q 15 checks. Continue with POC.

## 2016-11-05 NOTE — BHH Group Notes (Signed)
[place] LCSW Group Therapy  11/05/2016 2:58 PM  Type of Therapy:  Group Therapy  Participation Level:  Did Not Attend-pt invited. Chose to remain in bed.   Summary of Progress/Problems:  Finding Balance in Life. Today's group focused on defining balance in one's own words, identifying things that can knock one off balance, and exploring healthy ways to maintain balance in life. Group members were asked to provide an example of a time when they felt off balance, describe how they handled that situation,and process healthier ways to regain balance in the future. Group members were asked to share the most important tool for maintaining balance that they learned while at [facility] and how they plan to apply this method after discharge.   [place] LCSW 11/05/2016, 2:58 PM

## 2016-11-05 NOTE — BH Assessment (Signed)
Tele Assessment Note   [name] is an 46 y.o. male presenting to [facility] with his wife, endorsing SI with intent and plan to shoot self.  Long history of substance use. Reports a 30 yr history of cocaine abuse, last binging episode was today, $500 dollars worth. Also, abusing alcohol. Today drank 3 24 oz beers. Denies HI and A/V. Denies criminal charges or probation. One prior inpatient psychiatric admission at age 39 yrs old. Reports family discord, health and job issues as current stressors. Patient had normal speech, depressed mood, fair eye contact, oriented x3, impaired judgement, and impaired insight.  Patriciaann Clan NP recommends psychiatric inpatient   Diagnosis: MDD, recurrent episode severe; Cocaine use disorder; Alcohol use disorder   Past Medical History: History reviewed. No pertinent past medical history.  History reviewed. No pertinent surgical history.  Family History: History reviewed. No pertinent family history.  Social History:  reports that he has been smoking.  He has never used smokeless tobacco. His alcohol and drug histories are not on file.  Additional Social History:  Alcohol / Drug Use Pain Medications: see MAR Prescriptions: see MAR Over the Counter: see MAR History of alcohol / drug use?: Yes Substance #1 Name of Substance 1: alcohol 1 - Age of First Use: UTA 1 - Amount (size/oz): UTA 1 - Frequency: frequent use 1 - Duration: years 1 - Last Use / Amount: 3 24 oz beers today Substance #2 Name of Substance 2: cocaine 2 - Age of First Use: UTA 2 - Amount (size/oz): $500 today 2 - Frequency: binging  2 - Duration: 30 yrs  2 - Last Use / Amount: today  CIWA: CIWA-Ar BP: (!) 152/92 Pulse Rate: 75 COWS:    PATIENT STRENGTHS: (choose at least two) Average or above average intelligence General fund of knowledge  Allergies: Not on File  Home Medications:  No prescriptions prior to admission.    OB/GYN Status:  No LMP for male  patient.  General Assessment Data Location of Assessment: [facility] Assessment Services TTS Assessment: In system Is this a Tele or Face-to-Face Assessment?: Face-to-Face Is this an Initial Assessment or a Re-assessment for this encounter?: Initial Assessment Marital status: Married Is patient pregnant?: No Pregnancy Status: No Living Arrangements: Spouse/significant other Can pt return to current living arrangement?: Yes Admission Status: Voluntary Is patient capable of signing voluntary admission?: Yes Referral Source: Self/Family/Friend Insurance type: Insurance risk surveyor Exam ([place]) Medical Exam completed: Yes  Crisis Care Plan Living Arrangements: Spouse/significant other Name of Psychiatrist: n/a Name of Therapist: n/a  Education Status Is patient currently in school?: No  Risk to self with the past 6 months Suicidal Ideation: Yes-Currently Present Has patient been a risk to self within the past 6 months prior to admission? : Yes Suicidal Intent: Yes-Currently Present Has patient had any suicidal intent within the past 6 months prior to admission? : Yes Is patient at risk for suicide?: Yes Suicidal Plan?: Yes-Currently Present Has patient had any suicidal plan within the past 6 months prior to admission? : Yes Specify Current Suicidal Plan: to shoot self Access to Means: No (unknown ) What has been your use of drugs/alcohol within the last 12 months?: cocaine, alcohol Intentional Self Injurious Behavior: None Family Suicide History: Unknown Recent stressful life event(s): Conflict (Comment) Persecutory voices/beliefs?: No Depression: Yes Depression Symptoms: Loss of interest in usual pleasures, Feeling worthless/self pity Substance abuse history and/or treatment for substance abuse?: Yes Suicide prevention information given to non-admitted patients: Not applicable  Risk to Others within the past 6 months Homicidal Ideation: No Does patient have any  lifetime risk of violence toward others beyond the six months prior to admission? : No Thoughts of Harm to Others: No Current Homicidal Intent: No Current Homicidal Plan: No Access to Homicidal Means: No History of harm to others?: No Assessment of Violence: None Noted Does patient have access to weapons?: No Criminal Charges Pending?: No Does patient have a court date: No Is patient on probation?: No  Psychosis Hallucinations: None noted Delusions: None noted  Mental Status Report Appearance/Hygiene: Unremarkable Eye Contact: Fair Motor Activity: Freedom of movement Speech: Logical/coherent Level of Consciousness: Alert Mood: Depressed Affect: Appropriate to circumstance Anxiety Level: None Thought Processes: Coherent, Relevant Judgement: Impaired Orientation: Person, Place, Time, Situation Obsessive Compulsive Thoughts/Behaviors: None  Cognitive Functioning Concentration: Fair Memory: Recent Intact, Remote Intact IQ: Average Insight: Poor Impulse Control: Good Appetite:  (UTA) Weight Loss: 0 Weight Gain: 0 Sleep:  (UTA) Vegetative Symptoms: None  ADLScreening [facility] Assessment Services) Patient's cognitive ability adequate to safely complete daily activities?: Yes Patient able to express need for assistance with ADLs?: Yes Independently performs ADLs?: Yes (appropriate for developmental age)  Prior Inpatient Therapy Prior Inpatient Therapy: Yes Prior Therapy Dates: at 46 yrs old Prior Therapy Facilty/Provider(s): Unknown Reason for Treatment: depression  Prior Outpatient Therapy Prior Outpatient Therapy: No Does patient have an ACCT team?: No Does patient have Intensive In-House Services?  : No Does patient have [facility] services? : No Does patient have P4CC services?: No  ADL Screening (condition at time of admission) Patient's cognitive ability adequate to safely complete daily activities?: Yes Is the patient deaf or have difficulty hearing?: No Does  the patient have difficulty seeing, even when wearing glasses/contacts?: No Does the patient have difficulty concentrating, remembering, or making decisions?: No Patient able to express need for assistance with ADLs?: Yes Does the patient have difficulty dressing or bathing?: No Independently performs ADLs?: Yes (appropriate for developmental age) Does the patient have difficulty walking or climbing stairs?: No Weakness of Legs: None Weakness of Arms/Hands: None  Home Assistive Devices/Equipment Home Assistive Devices/Equipment: None  Therapy Consults (therapy consults require a physician order) PT Evaluation Needed: No OT Evalulation Needed: No SLP Evaluation Needed: No Abuse/Neglect Assessment (Assessment to be complete while patient is alone) Physical Abuse: Denies Verbal Abuse: Denies Sexual Abuse: Denies Exploitation of patient/patient's resources: Denies Self-Neglect: Denies Values / Beliefs Cultural Requests During Hospitalization: None Spiritual Requests During Hospitalization: None Consults Spiritual Care Consult Needed: No Social Work Consult Needed: No Regulatory affairs officer (For Healthcare) Does Patient Have a Medical Advance Directive?: No Would patient like information on creating a medical advance directive?: No - Patient declined Nutrition Screen- [facility] Adult/[facility]/AP Patient's home diet: Regular Has the patient recently lost weight without trying?: No Has the patient been eating poorly because of a decreased appetite?: No Malnutrition Screening Tool Score: 0  Additional Information 1:1 In Past 12 Months?: No CIRT Risk: No Elopement Risk: No Does patient have medical clearance?: No     Disposition:  Disposition Initial Assessment Completed for this Encounter: Yes Disposition of Patient: Inpatient treatment program Type of inpatient treatment program: Adult  [place] 11/05/2016 2:04 AM

## 2016-11-05 NOTE — H&P (Signed)
Psychiatric Admission Assessment Adult  Patient Identification: [name] MRN:  [id number] Date of Evaluation:  11/05/2016 Chief Complaint:  Mdd cocaine use dep Principal Diagnosis: Alcohol dependence with alcohol-induced mood disorder ([place]) Diagnosis:   Patient Active Problem List   Diagnosis Date Noted  . Substance induced mood disorder ([place]) [F19.94] 11/05/2016  . Alcohol dependence with alcohol-induced mood disorder ([place]) [F10.24]    History of Present Illness:  46 y.o. male who lives with his wife and 3 kids. Patient presented  to [facility] for suicidal ideation with intent/plan substance abuse. Patient acknowledges his reason for admission. He admits to having SI with a plan and reports he had a gun in his mouth and was planning on committing SA until his daughter told him not to do it. He endorses a 30 year history  of cocaine abuse, last binging episode was yesterday. Per admission assessment, patient spent $500 dollars on cocaine. Patient also endorses  A history of alcohol abuse. He admits to using both alcohol and cocaine several times per week. He denies any legal consequences behind his substance use and denies any attendance of substance abuse programs in the past.   Patient reports one prior SA that occurred 5-6 years ago. He endorses daily SI and states, " I always thought it was normal to have these thoughts." He denies feeling depressed or history thereof. He states, " I just feel this way because of my substance use."  Denies any history of cutting behaviors.  Denies HI and visual  Hallucinations. Endorses at times, he does here a loud whistles.   Reports one prior inpatient psychiatric admission at age 64 yrs and states at that time, he was [facility]. Denies any current outpatient care for mental health illness. Reports he has never been on any psychotropic or behavioral  medications.   Endorses current withdrawal symptoms and describes them as generalized body  aches, tremors and sweats. Reports a family history of mental health illness as his brother and uncle yet reports diagnosis as unknown. Reports a medical history remarkable for HTN with no medication used for management.    As per admission note; Reports family discord, health and job issues as current stressors.   During thi evaluation, patient is alert and oriented x4, calm and cooperative.  He denies SI, HI or AVH. He presents with good eye contact. His insight is good. He endorses that he would like long term placement for his substance use once discharged.    Associated Signs/Symptoms: Depression Symptoms:  denies current depressive symtpoms  (Hypo) Manic Symptoms:  none  Anxiety Symptoms:  none  Psychotic Symptoms:  none  PTSD Symptoms: NA Total Time spent with patient: 1 hour  Past Psychiatric History: Substance abuse. One prior SA 5-6 years ago. age 45 yrs and states at that time, he was admitted to [facility]  Is the patient at risk to self? Yes.    Has the patient been a risk to self in the past 6 months? Yes.    Has the patient been a risk to self within the distant past? Yes.    Is the patient a risk to others? No.  Has the patient been a risk to others in the past 6 months? No.  Has the patient been a risk to others within the distant past? No.   Prior Inpatient Therapy: Prior Inpatient Therapy: Yes Prior Therapy Dates: at 46 yrs old Prior Therapy Facilty/Provider(s): Unknown Reason for Treatment: depression Prior Outpatient Therapy:  Prior Outpatient Therapy: No Does patient have an ACCT team?: No Does patient have Intensive In-House Services?  : No Does patient have [facility] services? : No Does patient have P4CC services?: No  Alcohol Screening: 1. How often do you have a drink containing alcohol?: 4 or more times a week 2. How many drinks containing alcohol do you have on a typical day when you are drinking?: 10 or more 3. How often do you have six or more drinks  on one occasion?: Daily or almost daily Preliminary Score: 8 4. How often during the last year have you found that you were not able to stop drinking once you had started?: Never 5. How often during the last year have you failed to do what was normally expected from you becasue of drinking?: Never 6. How often during the last year have you needed a first drink in the morning to get yourself going after a heavy drinking session?: Never 7. How often during the last year have you had a feeling of guilt of remorse after drinking?: Daily or almost daily 8. How often during the last year have you been unable to remember what happened the night before because you had been drinking?: Never 9. Have you or someone else been injured as a result of your drinking?: Yes, but not in the last year 10. Has a relative or friend or a doctor or another health worker been concerned about your drinking or suggested you cut down?: Yes, during the last year Alcohol Use Disorder Identification Test Final Score (AUDIT): 22 Brief Intervention: Yes Substance Abuse History in the last 12 months:  No. Consequences of Substance Abuse: Withdrawal Symptoms:   Diaphoresis Tremors body aches Previous Psychotropic Medications: No  Psychological Evaluations: No  Past Medical History: History reviewed. No pertinent past medical history. History reviewed. No pertinent surgical history. Family History: History reviewed. No pertinent family history. Family Psychiatric  History: brother and uncle yet reports diagnosis as unknown Tobacco Screening: Have you used any form of tobacco in the last 30 days? (Cigarettes, Smokeless Tobacco, Cigars, and/or Pipes): Yes Tobacco use, Select all that apply: 5 or more cigarettes per day Are you interested in Tobacco Cessation Medications?: Yes, will notify MD for an order Counseled patient on smoking cessation including recognizing danger situations, developing coping skills and basic information  about quitting provided: Yes Social History:  History  Alcohol use Not on file     History  Drug use: Unknown    Additional Social History: Marital status: Married    Pain Medications: see MAR Prescriptions: see MAR Over the Counter: see MAR History of alcohol / drug use?: Yes Name of Substance 1: alcohol 1 - Age of First Use: UTA 1 - Amount (size/oz): UTA 1 - Frequency: frequent use 1 - Duration: years 1 - Last Use / Amount: 3 24 oz beers today Name of Substance 2: cocaine 2 - Age of First Use: UTA 2 - Amount (size/oz): $500 today 2 - Frequency: binging  2 - Duration: 30 yrs  2 - Last Use / Amount: today                Allergies:  No Known Allergies Lab Results:  Results for orders placed or performed during the hospital encounter of 11/04/16 (from the past 48 hour(s))  Urine rapid drug screen (hosp performed)not at [facility]     Status: Abnormal   Collection Time: 11/05/16  6:14 AM  Result Value Ref Range   Opiates NONE  DETECTED NONE DETECTED   Cocaine POSITIVE (A) NONE DETECTED   Benzodiazepines NONE DETECTED NONE DETECTED   Amphetamines NONE DETECTED NONE DETECTED   Tetrahydrocannabinol NONE DETECTED NONE DETECTED   Barbiturates NONE DETECTED NONE DETECTED    Comment:        DRUG SCREEN FOR MEDICAL PURPOSES ONLY.  IF CONFIRMATION IS NEEDED FOR ANY PURPOSE, NOTIFY LAB WITHIN 5 DAYS.        LOWEST DETECTABLE LIMITS FOR URINE DRUG SCREEN Drug Class       Cutoff (ng/mL) Amphetamine      1000 Barbiturate      200 Benzodiazepine   102 Tricyclics       585 Opiates          300 Cocaine          300 THC              50 Performed at [facility], [place] [address] [place], [place] [address]   CBC     Status: Abnormal   Collection Time: 11/05/16  6:31 AM  Result Value Ref Range   WBC 9.1 4.0 - 10.5 K/uL   RBC 4.66 4.22 - 5.81 MIL/uL   Hemoglobin 14.0 13.0 - 17.0 g/dL   HCT 40.4 39.0 - 52.0 %   MCV 86.7 78.0 - 100.0 fL   MCH 30.0 26.0 -  34.0 pg   MCHC 34.7 30.0 - 36.0 g/dL   RDW 13.4 11.5 - 15.5 %   Platelets 146 (L) 150 - 400 K/uL    Comment: Performed at [facility], [place] [address] [place], [place] [address]  Comprehensive metabolic panel     Status: Abnormal   Collection Time: 11/05/16  6:31 AM  Result Value Ref Range   Sodium 136 135 - 145 mmol/L   Potassium 3.7 3.5 - 5.1 mmol/L   Chloride 99 (L) 101 - 111 mmol/L   CO2 28 22 - 32 mmol/L   Glucose, Bld 127 (H) 65 - 99 mg/dL   BUN 10 6 - 20 mg/dL   Creatinine, Ser 1.00 0.61 - 1.24 mg/dL   Calcium 9.1 8.9 - 10.3 mg/dL   Total Protein 8.7 (H) 6.5 - 8.1 g/dL   Albumin 4.1 3.5 - 5.0 g/dL   AST 120 (H) 15 - 41 U/L   ALT 144 (H) 17 - 63 U/L   Alkaline Phosphatase 69 38 - 126 U/L   Total Bilirubin 1.3 (H) 0.3 - 1.2 mg/dL   GFR calc non Af Amer >60 >60 mL/min   GFR calc Af Amer >60 >60 mL/min    Comment: (NOTE) The eGFR has been calculated using the CKD EPI equation. This calculation has not been validated in all clinical situations. eGFR's persistently <60 mL/min signify possible Chronic Kidney Disease.    Anion gap 9 5 - 15    Comment: Performed at [facility], [place] [address], [place], [place] [address]  Lipid panel     Status: Abnormal   Collection Time: 11/05/16  6:31 AM  Result Value Ref Range   Cholesterol 123 0 - 200 mg/dL   Triglycerides 200 (H) <150 mg/dL   HDL 46 >40 mg/dL   Total CHOL/HDL Ratio 2.7 RATIO   VLDL 40 0 - 40 mg/dL   LDL Cholesterol 37 0 - 99 mg/dL    Comment:        Total Cholesterol/HDL:CHD Risk Coronary Heart Disease Risk Table  Men   Women  1/2 Average Risk   3.4   3.3  Average Risk       5.0   4.4  2 X Average Risk   9.6   7.1  3 X Average Risk  23.4   11.0        Use the calculated Patient Ratio above and the CHD Risk Table to determine the patient's CHD Risk.        ATP III CLASSIFICATION (LDL):  <100     mg/dL   Optimal  100-129  mg/dL   Near or Above                     Optimal  130-159  mg/dL   Borderline  160-189  mg/dL   High  >190     mg/dL   Very High Performed at [place] [address] [place], [place] [address]   Hepatic function panel     Status: Abnormal   Collection Time: 11/05/16  6:31 AM  Result Value Ref Range   Total Protein 8.7 (H) 6.5 - 8.1 g/dL   Albumin 4.1 3.5 - 5.0 g/dL   AST 120 (H) 15 - 41 U/L   ALT 143 (H) 17 - 63 U/L   Alkaline Phosphatase 67 38 - 126 U/L   Total Bilirubin 1.1 0.3 - 1.2 mg/dL   Bilirubin, Direct 0.2 0.1 - 0.5 mg/dL   Indirect Bilirubin 0.9 0.3 - 0.9 mg/dL    Comment: Performed at [facility], [place] [address], [place], [place] [address]  TSH     Status: None   Collection Time: 11/05/16  6:31 AM  Result Value Ref Range   TSH 1.707 0.350 - 4.500 uIU/mL    Comment: Performed by a 3rd Generation assay with a functional sensitivity of <=0.01 uIU/mL. Performed at [facility], [place] [address], [place],  [address]     Blood Alcohol level:  No results found for: [facility]  Metabolic Disorder Labs:  No results found for: HGBA1C, MPG No results found for: PROLACTIN Lab Results  Component Value Date   CHOL 123 11/05/2016   TRIG 200 (H) 11/05/2016   HDL 46 11/05/2016   CHOLHDL 2.7 11/05/2016   VLDL 40 11/05/2016   LDLCALC 37 11/05/2016    Current Medications: Current Facility-Administered Medications  Medication Dose Route Frequency Provider Last Rate Last Dose  . alum & mag hydroxide-simeth (MAALOX/MYLANTA) 200-200-20 MG/5ML suspension 30 mL  30 mL Oral Q4H PRN Patriciaann Clan E, PA-C      . hydrOXYzine (ATARAX/VISTARIL) tablet 25 mg  25 mg Oral Q6H PRN Patriciaann Clan E, PA-C      . loperamide (IMODIUM) capsule 2-4 mg  2-4 mg Oral PRN Patriciaann Clan E, PA-C   4 mg at 11/05/16 0837  . LORazepam (ATIVAN) tablet 1 mg  1 mg Oral Q6H PRN Laverle Hobby, PA-C      . LORazepam (ATIVAN) tablet 1 mg  1 mg Oral QID Patriciaann Clan E, PA-C   1 mg at  11/05/16 1153   Followed by  . [START ON 11/06/2016] LORazepam (ATIVAN) tablet 1 mg  1 mg Oral TID Laverle Hobby, PA-C       Followed by  . [START ON 11/07/2016] LORazepam (ATIVAN) tablet 1 mg  1 mg Oral BID Patriciaann Clan E, PA-C       Followed by  . [START ON 11/08/2016] LORazepam (ATIVAN) tablet 1 mg  1 mg Oral Daily Laverle Hobby, PA-C      . magnesium hydroxide (MILK OF MAGNESIA) suspension 30 mL  30 mL Oral Daily PRN Laverle Hobby, PA-C      . multivitamin with minerals tablet 1 tablet  1 tablet Oral Daily Laverle Hobby, PA-C   1 tablet at 11/05/16 0834  . nicotine polacrilex (NICORETTE) gum 2 mg  2 mg Oral PRN Laverle Hobby, PA-C      . ondansetron (ZOFRAN-ODT) disintegrating tablet 4 mg  4 mg Oral Q6H PRN Patriciaann Clan E, PA-C      . thiamine (B-1) injection 100 mg  100 mg Intramuscular Once Laverle Hobby, PA-C   Stopped at 11/05/16 0030  . [START ON 11/06/2016] thiamine (VITAMIN B-1) tablet 100 mg  100 mg Oral Daily Simon, Spencer E, PA-C      . traZODone (DESYREL) tablet 50 mg  50 mg Oral QHS PRN Dayln Tugwell, Myer Peer, MD       PTA Medications: No prescriptions prior to admission.    Musculoskeletal: Strength & Muscle Tone: within normal limits Gait & Station: normal Patient leans: N/A  Psychiatric Specialty Exam: Physical Exam  Nursing note and vitals reviewed. Constitutional: He is oriented to person, place, and time.  Neurological: He is alert and oriented to person, place, and time.    Review of Systems  Psychiatric/Behavioral: Positive for depression, substance abuse and suicidal ideas. Negative for hallucinations and memory loss. The patient is nervous/anxious and has insomnia.   All other systems reviewed and are negative.   Blood pressure (!) 153/98, pulse 71, temperature 100.1 F (37.8 C), temperature source Oral, resp. rate 20, height '5\' 3"'$  (1.6 m), weight 126 lb (57.2 kg).Body mass index is 22.32 kg/m.  General Appearance: Well Groomed  Eye  Contact:  Good  Speech:  Clear and Coherent and Normal Rate  Volume:  Normal  Mood:  Depressed, Hopeless and Worthless  Affect:  Depressed  Thought Process:  Coherent, Linear and Descriptions of Associations: Intact  Orientation:  Full (Time, Place, and Person)  Thought Content:  Logical denies AVH. No preoccupations or ruminations    Suicidal Thoughts:  Yes.  with intent/plan  Homicidal Thoughts:  No  Memory:  Immediate;   Fair Recent;   Fair  Judgement:  Impaired  Insight:  Fair  Psychomotor Activity:  Normal  Concentration:  Concentration: Fair and Attention Span: Fair  Recall:  [employer] of Knowledge:  Fair  Language:  Good  Akathisia:  Negative  Handed:  Right  AIMS (if indicated):     Assets:  Communication Skills Desire for Improvement Resilience  ADL's:  Intact  Cognition:  WNL  Sleep:  Number of Hours: 3.5 (late night admission)    Treatment Plan Summary: Daily contact with patient to assess and evaluate symptoms and progress in treatment   Treatment Plan/Recommendations: 1. Admit for crisis management and stabilization, estimated length of stay 3-5 days.  2. Medication management to reduce current symptoms to base line and improve the patient's overall level of functioning: See Md's SRATreatment plan.? 3. Treat health problems as indicated.  4. Develop treatment plan to decrease risk of relapse upon discharge and the need for readmission.  5. Psycho-social education regarding relapse prevention and self care.  6. Health care follow up as needed for medical problems.  7. Review, reconcile, and reinstate any pertinent home medications for other health issues where appropriate. 8. Call for consults with hospitalist for any additional specialty  patient care services as needed. 9. Begin Clonidine detox protocol for withdrawal symptoms.   Observation Level/Precautions:  15 minute checks  Laboratory:  Per, UDS (+) for Cocaine. TSH, lipid panel normal. Ordered HgbA1c.  AST 120, ALT 144. Prolactin in process. Ordered Ethanol   Psychotherapy:  Group milieu   Medications:  See MAR  Consultations:  As needed.  Discharge Concerns:  Mood stability, maintaining sobriety & safety  Estimated LOS:2-4 days.  Other:  Admit to the 300-hall.     Physician Treatment Plan for Primary Diagnosis: Alcohol dependence with alcohol-induced mood disorder ([place]) Long Term Goal(s): Improvement in symptoms so as ready for discharge  Short Term Goals: Ability to identify changes in lifestyle to reduce recurrence of condition will improve, Ability to verbalize feelings will improve, Compliance with prescribed medications will improve and Ability to identify triggers associated with substance abuse/mental health issues will improve  Physician Treatment Plan for Secondary Diagnosis: Principal Problem:   Alcohol dependence with alcohol-induced mood disorder ([place]) Active Problems:   Substance induced mood disorder ([place])  Long Term Goal(s): Improvement in symptoms so as ready for discharge  Short Term Goals: Ability to disclose and discuss suicidal ideas, Ability to demonstrate self-control will improve and Ability to identify and develop effective coping behaviors will improve  I certify that inpatient services furnished can reasonably be expected to improve the patient's condition.    Mordecai Maes, NP 8/9/20184:12 PM  I have discussed case with NP and have met with patient Agree with NP assessment  46 year old married male, has 6 children, all adults. Lives with wife. Currently unemployed.   Presented with worsening depression, sadness, suicidal ideations with thoughts of shooting self . States " I have been feeling bad for months now". Reports neuro-vegetative symptoms of depression, including poor sleep, poor appetite, poor energy level, suicidal ideations,anhedonia. Denies hallucinations. He reports history of alcohol and drug abuse , and states he has been drinking daily,  heavily, up to 18 per day. He has also been using crack cocaine regularly.   Medical history is remarkable for HTN- no history of seizures .  Was not taking any psychiatric medications prior to admission  Dx- Alcohol Dependence, Cocaine Dependence, Alcohol Induced Mood Disorder   Plan- inpatient admission, started on Ativan detox protocol to minimize risk of alcohol WDL.  *Patient presents with low grade fever which may be related to ETOH withdrawal.  WBC 9.1 Will monitor and repeat CBC

## 2016-11-05 NOTE — H&P (Signed)
Behavioral Health Medical Screening Exam  [name] is an 46 y.o. male presenting with his wife, endorsing depression symptoms to include hopelessness, despair, worthlessness and guilt due to ongoing illicit drug use.He is endorsing SI with intent, plan and means to shoot himself today. The patient has been addicted to cocaine with off and on binges with cocaine for 30 years. Last use today of 500.00 dollars and alcohol intake of three 24 oz beers/daily. He is denying any pending legals issus, DUI or being on probation. Identified stressors include family discord, health and job issues. He has a hx of HTN, HLD and TIA's.  Total Time spent with patient: 20 minutes  Psychiatric Specialty Exam: Physical Exam  Constitutional: He is oriented to person, place, and time. He appears well-developed and well-nourished. No distress.  HENT:  Head: Normocephalic.  Eyes: Pupils are equal, round, and reactive to light.  Respiratory: Effort normal and breath sounds normal. No respiratory distress.  Neurological: He is alert and oriented to person, place, and time. No cranial nerve deficit.  Skin: Skin is warm and dry. He is not diaphoretic.  Psychiatric: His speech is normal. His mood appears anxious. His affect is angry. He is withdrawn. He expresses impulsivity. He exhibits a depressed mood. He expresses suicidal ideation. He expresses suicidal plans.    Review of Systems  Psychiatric/Behavioral: Positive for depression, substance abuse and suicidal ideas. The patient is nervous/anxious and has insomnia.     There were no vitals taken for this visit.There is no height or weight on file to calculate BMI.  General Appearance: Casual  Eye Contact:  Fair  Speech:  Clear and Coherent  Volume:  Normal  Mood:  Depressed  Affect:  Congruent  Thought Process:  Goal Directed  Orientation:  Full (Time, Place, and Person)  Thought Content:  Negative  Suicidal Thoughts:  Yes.  with intent/plan  Homicidal  Thoughts:  No  Memory:  Immediate;   Fair  Judgement:  Impaired  Insight:  Lacking  Psychomotor Activity:  Negative  Concentration: Concentration: Fair  Recall:  [employer] of Knowledge:Fair  Language: Negative  Akathisia:  Negative  Handed:  Right  AIMS (if indicated):     Assets:  Desire for Improvement  Sleep:       Musculoskeletal: Strength & Muscle Tone: within normal limits Gait & Station: normal Patient leans: N/A  There were no vitals taken for this visit.  Recommendations:  Based on my evaluation the patient does not appear to have an emergency medical condition.  Laverle Hobby, PA-C 11/05/2016, 12:24 AM

## 2016-11-05 NOTE — Tx Team (Signed)
Interdisciplinary Treatment and Diagnostic Plan Update  11/05/2016 Time of Session: 0830AM [name] MRN: [id number]  Principal Diagnosis: MDD Secondary Diagnoses: Active Problems:   Substance induced mood disorder (HCC)   Current Medications:  Current Facility-Administered Medications  Medication Dose Route Frequency Provider Last Rate Last Dose  . alum & mag hydroxide-simeth (MAALOX/MYLANTA) 200-200-20 MG/5ML suspension 30 mL  30 mL Oral Q4H PRN Patriciaann Clan E, PA-C      . hydrOXYzine (ATARAX/VISTARIL) tablet 25 mg  25 mg Oral Q6H PRN Patriciaann Clan E, PA-C      . loperamide (IMODIUM) capsule 2-4 mg  2-4 mg Oral PRN Patriciaann Clan E, PA-C   4 mg at 11/05/16 0837  . LORazepam (ATIVAN) tablet 1 mg  1 mg Oral Q6H PRN Laverle Hobby, PA-C      . LORazepam (ATIVAN) tablet 1 mg  1 mg Oral QID Patriciaann Clan E, PA-C   1 mg at 11/05/16 1153   Followed by  . [START ON 11/06/2016] LORazepam (ATIVAN) tablet 1 mg  1 mg Oral TID Laverle Hobby, PA-C       Followed by  . [START ON 11/07/2016] LORazepam (ATIVAN) tablet 1 mg  1 mg Oral BID Patriciaann Clan E, PA-C       Followed by  . [START ON 11/08/2016] LORazepam (ATIVAN) tablet 1 mg  1 mg Oral Daily Simon, Spencer E, PA-C      . magnesium hydroxide (MILK OF MAGNESIA) suspension 30 mL  30 mL Oral Daily PRN Laverle Hobby, PA-C      . multivitamin with minerals tablet 1 tablet  1 tablet Oral Daily Laverle Hobby, PA-C   1 tablet at 11/05/16 0834  . nicotine polacrilex (NICORETTE) gum 2 mg  2 mg Oral PRN Laverle Hobby, PA-C      . OLANZapine (ZYPREXA) tablet 5 mg  5 mg Oral QHS Laverle Hobby, PA-C   Stopped at 11/05/16 0030  . ondansetron (ZOFRAN-ODT) disintegrating tablet 4 mg  4 mg Oral Q6H PRN Patriciaann Clan E, PA-C      . thiamine (B-1) injection 100 mg  100 mg Intramuscular Once Laverle Hobby, PA-C   Stopped at 11/05/16 0030  . [START ON 11/06/2016] thiamine (VITAMIN B-1) tablet 100 mg  100 mg Oral Daily Simon, Spencer E, PA-C       . traZODone (DESYREL) tablet 50 mg  50 mg Oral QHS,MR X 1 Simon, Spencer E, PA-C       PTA Medications: No prescriptions prior to admission.    Patient Stressors: Marital or family conflict Substance abuse  Patient Strengths: Average or above average intelligence General fund of knowledge  Treatment Modalities: Medication Management, Group therapy, Case management,  1 to 1 session with clinician, Psychoeducation, Recreational therapy.   Physician Treatment Plan for Primary Diagnosis: MDD Long Term Goal(s): Improvement in symptoms so as ready for discharge Improvement in symptoms so as ready for discharge   Short Term Goals: Ability to identify changes in lifestyle to reduce recurrence of condition will improve Ability to verbalize feelings will improve Compliance with prescribed medications will improve Ability to identify triggers associated with substance abuse/mental health issues will improve Ability to disclose and discuss suicidal ideas Ability to demonstrate self-control will improve Ability to identify and develop effective coping behaviors will improve  Medication Management: Evaluate patient's response, side effects, and tolerance of medication regimen.  Therapeutic Interventions: 1 to 1 sessions, Unit Group sessions and Medication administration.  Evaluation of  Outcomes: Not Met  Physician Treatment Plan for Secondary Diagnosis: Active Problems:   Substance induced mood disorder ([place])  Long Term Goal(s): Improvement in symptoms so as ready for discharge Improvement in symptoms so as ready for discharge   Short Term Goals: Ability to identify changes in lifestyle to reduce recurrence of condition will improve Ability to verbalize feelings will improve Compliance with prescribed medications will improve Ability to identify triggers associated with substance abuse/mental health issues will improve Ability to disclose and discuss suicidal ideas Ability to  demonstrate self-control will improve Ability to identify and develop effective coping behaviors will improve     Medication Management: Evaluate patient's response, side effects, and tolerance of medication regimen.  Therapeutic Interventions: 1 to 1 sessions, Unit Group sessions and Medication administration.  Evaluation of Outcomes: Not Met   RN Treatment Plan for Primary Diagnosis: MDD Long Term Goal(s): Knowledge of disease and therapeutic regimen to maintain health will improve  Short Term Goals: Ability to remain free from injury will improve, Ability to participate in decision making will improve and Ability to verbalize feelings will improve  Medication Management: RN will administer medications as ordered by provider, will assess and evaluate patient's response and provide education to patient for prescribed medication. RN will report any adverse and/or side effects to prescribing provider.  Therapeutic Interventions: 1 on 1 counseling sessions, Psychoeducation, Medication administration, Evaluate responses to treatment, Monitor vital signs and CBGs as ordered, Perform/monitor CIWA, COWS, AIMS and Fall Risk screenings as ordered, Perform wound care treatments as ordered.  Evaluation of Outcomes: Not Met   LCSW Treatment Plan for Primary Diagnosis: MDD Long Term Goal(s): Safe transition to appropriate next level of care at discharge, Engage patient in therapeutic group addressing interpersonal concerns.  Short Term Goals: Engage patient in aftercare planning with referrals and resources, Facilitate patient progression through stages of change regarding substance use diagnoses and concerns and Identify triggers associated with mental health/substance abuse issues  Therapeutic Interventions: Assess for all discharge needs, 1 to 1 time with Social worker, Explore available resources and support systems, Assess for adequacy in community support network, Educate family and significant  other(s) on suicide prevention, Complete Psychosocial Assessment, Interpersonal group therapy.  Evaluation of Outcomes: Not Met   Progress in Treatment: Attending groups: No. New to unit. Continuing to assess.  Participating in groups: No. Taking medication as prescribed: Yes. Toleration medication: Yes. Family/Significant other contact made: No, will contact:  family member/wife if patient consents Patient understands diagnosis: Yes. Discussing patient identified problems/goals with staff: Yes. Medical problems stabilized or resolved: Yes. Denies suicidal/homicidal ideation: No. Passive SI/Able to contract for safety on the unit.  Issues/concerns per patient self-inventory: No. Other: n/a   New problem(s) identified: No, Describe:  n/a   Patient Goal: "to get help with detox and finding a long term place to go for residential treatment. I need help to stay sober."   New Short Term/Long Term Goal(s): alcohol detox/cocaine abuse, medication management for mood stabilization; elimination of SI thoughts; development of comprehensive mental wellness/sobriety plan.   Discharge Plan or Barriers: CSW assessing. Pt lives with his wife in [place], [place]. No current providers listed. Pt interested in long term treatment per NP.   Reason for Continuation of Hospitalization: Anxiety Depression Medication stabilization Suicidal ideation Withdrawal symptoms  Estimated Length of Stay: Monday, 11/09/16  Attendees: Patient: 11/05/2016 3:00 PM  Physician: Dr. Parke Poisson MD 11/05/2016 3:00 PM  Nursing: Lianne Cure RN 11/05/2016 3:00 PM  RN Care Manager: Lars Pinks  CM 11/05/2016 3:00 PM  Social Worker: [place], LCSW 11/05/2016 3:00 PM  Recreational Therapist: x 11/05/2016 3:00 PM  Other: Lindell Spar NP; Mordecai Maes NP; Mordecai Maes NP 11/05/2016 3:00 PM  Other:  11/05/2016 3:00 PM  Other: 11/05/2016 3:00 PM    Scribe for Treatment Team: Kimber Relic Smart, LCSW 11/05/2016 3:00 PM

## 2016-11-05 NOTE — Progress Notes (Signed)
Per pt and pt's wife's request, hospital note with admission date faxed to wife: [phone]. Pt's wife informed that MD could complete FMLA papework only; STD paperwork must be completed by pt's outpatient provider. Letter and fax confirmation provided to pt for his records.  Maxie Better, MSW, LCSW Clinical Social Worker 11/05/2016 11:10 AM

## 2016-11-06 LAB — PROLACTIN: Prolactin: 9.2 ng/mL (ref 4.0–15.2)

## 2016-11-06 MED ORDER — PANTOPRAZOLE SODIUM 40 MG PO TBEC
40.0000 mg | DELAYED_RELEASE_TABLET | Freq: Every day | ORAL | Status: DC
  Administered 2016-11-06 – 2016-11-10 (×5): 40 mg via ORAL
  Filled 2016-11-06 (×8): qty 1

## 2016-11-06 MED ORDER — LISINOPRIL 10 MG PO TABS
10.0000 mg | ORAL_TABLET | Freq: Every day | ORAL | Status: DC
  Administered 2016-11-06 – 2016-11-10 (×5): 10 mg via ORAL
  Filled 2016-11-06: qty 1
  Filled 2016-11-06: qty 7
  Filled 2016-11-06 (×5): qty 1

## 2016-11-06 MED ORDER — IBUPROFEN 400 MG PO TABS
400.0000 mg | ORAL_TABLET | Freq: Four times a day (QID) | ORAL | Status: DC | PRN
  Administered 2016-11-06 – 2016-11-08 (×3): 400 mg via ORAL
  Filled 2016-11-06 (×2): qty 1

## 2016-11-06 MED ORDER — IBUPROFEN 400 MG PO TABS
ORAL_TABLET | ORAL | Status: AC
  Filled 2016-11-06: qty 1

## 2016-11-06 NOTE — Progress Notes (Signed)
[facility] referral made.  Maxie Better, MSW, LCSW Clinical Social Worker 11/06/2016 11:28 AM

## 2016-11-06 NOTE — Progress Notes (Signed)
[facility] MD Progress Note  11/06/2016 3:24 PM [name]  MRN:  [id number]   Subjective:  Patient is stating he feels fine. He denies any SI/HI/AVH since admission. He reports feeling tired and wanting to sleep and apologizes for it. He reports that his withdrawal symptoms have been minimal  Objective: Patient is pleasant and cooperative. He appears depressed but has been sleeping a lot.    Principal Problem: Alcohol dependence with alcohol-induced mood disorder ([place]) Diagnosis:   Patient Active Problem List   Diagnosis Date Noted  . Substance induced mood disorder ([place]) [F19.94] 11/05/2016  . Alcohol dependence with alcohol-induced mood disorder ([place]) [F10.24]    Total Time spent with patient: 15 minutes  Past Psychiatric History: See H&P  Past Medical History: History reviewed. No pertinent past medical history. History reviewed. No pertinent surgical history. Family History: History reviewed. No pertinent family history. Family Psychiatric  History: See H&P Social History:  History  Alcohol use Not on file     History  Drug use: Unknown    Social History   Social History  . Marital status: Married    Spouse name: N/A  . Number of children: N/A  . Years of education: N/A   Social History Main Topics  . Smoking status: Current Every Day Smoker  . Smokeless tobacco: Never Used  . Alcohol use None  . Drug use: Unknown  . Sexual activity: Not Asked   Other Topics Concern  . None   Social History Narrative  . None   Additional Social History:    Pain Medications: see MAR Prescriptions: see MAR Over the Counter: see MAR History of alcohol / drug use?: Yes Name of Substance 1: alcohol 1 - Age of First Use: UTA 1 - Amount (size/oz): UTA 1 - Frequency: frequent use 1 - Duration: years 1 - Last Use / Amount: 3 24 oz beers today Name of Substance 2: cocaine 2 - Age of First Use: UTA 2 - Amount (size/oz): $500 today 2 - Frequency: binging  2 - Duration: 30 yrs   2 - Last Use / Amount: today                Sleep: Good  Appetite:  Good  Current Medications: Current Facility-Administered Medications  Medication Dose Route Frequency Provider Last Rate Last Dose  . alum & mag hydroxide-simeth (MAALOX/MYLANTA) 200-200-20 MG/5ML suspension 30 mL  30 mL Oral Q4H PRN Patriciaann Clan E, PA-C      . hydrOXYzine (ATARAX/VISTARIL) tablet 25 mg  25 mg Oral Q6H PRN Patriciaann Clan E, PA-C   25 mg at 11/05/16 2149  . lisinopril (PRINIVIL,ZESTRIL) tablet 10 mg  10 mg Oral Daily Money, Lowry Ram, FNP   10 mg at 11/06/16 1156  . loperamide (IMODIUM) capsule 2-4 mg  2-4 mg Oral PRN Patriciaann Clan E, PA-C   2 mg at 11/06/16 0846  . LORazepam (ATIVAN) tablet 1 mg  1 mg Oral Q6H PRN Laverle Hobby, PA-C      . LORazepam (ATIVAN) tablet 1 mg  1 mg Oral TID Patriciaann Clan E, PA-C   1 mg at 11/06/16 1156   Followed by  . [START ON 11/07/2016] LORazepam (ATIVAN) tablet 1 mg  1 mg Oral BID Patriciaann Clan E, PA-C       Followed by  . [START ON 11/08/2016] LORazepam (ATIVAN) tablet 1 mg  1 mg Oral Daily Simon, Spencer E, PA-C      . magnesium hydroxide (MILK OF MAGNESIA)  suspension 30 mL  30 mL Oral Daily PRN Laverle Hobby, PA-C      . multivitamin with minerals tablet 1 tablet  1 tablet Oral Daily Laverle Hobby, PA-C   1 tablet at 11/06/16 0844  . nicotine polacrilex (NICORETTE) gum 2 mg  2 mg Oral PRN Patriciaann Clan E, PA-C   2 mg at 11/06/16 0846  . ondansetron (ZOFRAN-ODT) disintegrating tablet 4 mg  4 mg Oral Q6H PRN Patriciaann Clan E, PA-C      . thiamine (B-1) injection 100 mg  100 mg Intramuscular Once Laverle Hobby, PA-C   Stopped at 11/05/16 0030  . thiamine (VITAMIN B-1) tablet 100 mg  100 mg Oral Daily Patriciaann Clan E, PA-C   100 mg at 11/06/16 0844  . traZODone (DESYREL) tablet 50 mg  50 mg Oral QHS PRN Al Bracewell, Myer Peer, MD   50 mg at 11/05/16 2149    Lab Results:  Results for orders placed or performed during the hospital encounter of 11/04/16  (from the past 48 hour(s))  Urine rapid drug screen (hosp performed)not at [facility]     Status: Abnormal   Collection Time: 11/05/16  6:14 AM  Result Value Ref Range   Opiates NONE DETECTED NONE DETECTED   Cocaine POSITIVE (A) NONE DETECTED   Benzodiazepines NONE DETECTED NONE DETECTED   Amphetamines NONE DETECTED NONE DETECTED   Tetrahydrocannabinol NONE DETECTED NONE DETECTED   Barbiturates NONE DETECTED NONE DETECTED    Comment:        DRUG SCREEN FOR MEDICAL PURPOSES ONLY.  IF CONFIRMATION IS NEEDED FOR ANY PURPOSE, NOTIFY LAB WITHIN 5 DAYS.        LOWEST DETECTABLE LIMITS FOR URINE DRUG SCREEN Drug Class       Cutoff (ng/mL) Amphetamine      1000 Barbiturate      200 Benzodiazepine   742 Tricyclics       595 Opiates          300 Cocaine          300 THC              50 Performed at [facility], [place] [address] [place], [place] [address]   CBC     Status: Abnormal   Collection Time: 11/05/16  6:31 AM  Result Value Ref Range   WBC 9.1 4.0 - 10.5 K/uL   RBC 4.66 4.22 - 5.81 MIL/uL   Hemoglobin 14.0 13.0 - 17.0 g/dL   HCT 40.4 39.0 - 52.0 %   MCV 86.7 78.0 - 100.0 fL   MCH 30.0 26.0 - 34.0 pg   MCHC 34.7 30.0 - 36.0 g/dL   RDW 13.4 11.5 - 15.5 %   Platelets 146 (L) 150 - 400 K/uL    Comment: Performed at [facility], [place] [address] [place], [place] [address]  Comprehensive metabolic panel     Status: Abnormal   Collection Time: 11/05/16  6:31 AM  Result Value Ref Range   Sodium 136 135 - 145 mmol/L   Potassium 3.7 3.5 - 5.1 mmol/L   Chloride 99 (L) 101 - 111 mmol/L   CO2 28 22 - 32 mmol/L   Glucose, Bld 127 (H) 65 - 99 mg/dL   BUN 10 6 - 20 mg/dL   Creatinine, Ser 1.00 0.61 - 1.24 mg/dL   Calcium 9.1 8.9 - 10.3 mg/dL   Total Protein 8.7 (H) 6.5 - 8.1 g/dL   Albumin 4.1 3.5 -  5.0 g/dL   AST 120 (H) 15 - 41 U/L   ALT 144 (H) 17 - 63 U/L   Alkaline Phosphatase 69 38 - 126 U/L   Total Bilirubin 1.3 (H) 0.3 - 1.2 mg/dL   GFR  calc non Af Amer >60 >60 mL/min   GFR calc Af Amer >60 >60 mL/min    Comment: (NOTE) The eGFR has been calculated using the CKD EPI equation. This calculation has not been validated in all clinical situations. eGFR's persistently <60 mL/min signify possible Chronic Kidney Disease.    Anion gap 9 5 - 15    Comment: Performed at [facility], [place] [address] [place], [place] [address]  Lipid panel     Status: Abnormal   Collection Time: 11/05/16  6:31 AM  Result Value Ref Range   Cholesterol 123 0 - 200 mg/dL   Triglycerides 200 (H) <150 mg/dL   HDL 46 >40 mg/dL   Total CHOL/HDL Ratio 2.7 RATIO   VLDL 40 0 - 40 mg/dL   LDL Cholesterol 37 0 - 99 mg/dL    Comment:        Total Cholesterol/HDL:CHD Risk Coronary Heart Disease Risk Table                     Men   Women  1/2 Average Risk   3.4   3.3  Average Risk       5.0   4.4  2 X Average Risk   9.6   7.1  3 X Average Risk  23.4   11.0        Use the calculated Patient Ratio above and the CHD Risk Table to determine the patient's CHD Risk.        ATP III CLASSIFICATION (LDL):  <100     mg/dL   Optimal  100-129  mg/dL   Near or Above                    Optimal  130-159  mg/dL   Borderline  160-189  mg/dL   High  >190     mg/dL   Very High Performed at [facility] [address] [place], [place] [address]   Hepatic function panel     Status: Abnormal   Collection Time: 11/05/16  6:31 AM  Result Value Ref Range   Total Protein 8.7 (H) 6.5 - 8.1 g/dL   Albumin 4.1 3.5 - 5.0 g/dL   AST 120 (H) 15 - 41 U/L   ALT 143 (H) 17 - 63 U/L   Alkaline Phosphatase 67 38 - 126 U/L   Total Bilirubin 1.1 0.3 - 1.2 mg/dL   Bilirubin, Direct 0.2 0.1 - 0.5 mg/dL   Indirect Bilirubin 0.9 0.3 - 0.9 mg/dL    Comment: Performed at [facility], [place] [address], [place], [place] [address]  TSH     Status: None   Collection Time: 11/05/16  6:31 AM  Result Value Ref Range   TSH 1.707 0.350 - 4.500  uIU/mL    Comment: Performed by a 3rd Generation assay with a functional sensitivity of <=0.01 uIU/mL. Performed at [facility], [place] [address] [place], [place] [address]   Prolactin     Status: None   Collection Time: 11/05/16  6:31 AM  Result Value Ref Range   Prolactin 9.2 4.0 - 15.2 ng/mL    Comment: (NOTE) Performed At: [facility] [address]  [address] [place], [place] [id number] Lindon Romp MD [id number] Performed at [facility] [address], [place], [place] [address]   Hemoglobin A1c     Status: None   Collection Time: 11/05/16  6:40 PM  Result Value Ref Range   Hgb A1c MFr Bld 5.6 4.8 - 5.6 %    Comment: (NOTE) Pre diabetes:          5.7%-6.4% Diabetes:              >6.4% Glycemic control for   <7.0% adults with diabetes    Mean Plasma Glucose 114.02 mg/dL    Comment: Performed at [facility] [address] [place], [place] [address]  CBC with Differential/Platelet     Status: Abnormal   Collection Time: 11/05/16  6:40 PM  Result Value Ref Range   WBC 6.6 4.0 - 10.5 K/uL   RBC 4.77 4.22 - 5.81 MIL/uL   Hemoglobin 14.3 13.0 - 17.0 g/dL   HCT 41.7 39.0 - 52.0 %   MCV 87.4 78.0 - 100.0 fL   MCH 30.0 26.0 - 34.0 pg   MCHC 34.3 30.0 - 36.0 g/dL   RDW 13.3 11.5 - 15.5 %   Platelets 144 (L) 150 - 400 K/uL   Neutrophils Relative % 49 %   Neutro Abs 3.2 1.7 - 7.7 K/uL   Lymphocytes Relative 37 %   Lymphs Abs 2.5 0.7 - 4.0 K/uL   Monocytes Relative 13 %   Monocytes Absolute 0.9 0.1 - 1.0 K/uL   Eosinophils Relative 1 %   Eosinophils Absolute 0.1 0.0 - 0.7 K/uL   Basophils Relative 0 %   Basophils Absolute 0.0 0.0 - 0.1 K/uL    Comment: Performed at [facility], [place] [address], [place], [place] [address]  Ethanol     Status: None   Collection Time: 11/05/16  6:40 PM  Result Value Ref Range   Alcohol, Ethyl (B) <5 <5 mg/dL    Comment:        LOWEST DETECTABLE LIMIT FOR SERUM  ALCOHOL IS 5 mg/dL FOR MEDICAL PURPOSES ONLY Performed at [facility] [address] [place], [place] [address]     Blood Alcohol level:  Lab Results  Component Value Date   ETH <5 16/38/4665    Metabolic Disorder Labs: Lab Results  Component Value Date   HGBA1C 5.6 11/05/2016   MPG 114.02 11/05/2016   Lab Results  Component Value Date   PROLACTIN 9.2 11/05/2016   Lab Results  Component Value Date   CHOL 123 11/05/2016   TRIG 200 (H) 11/05/2016   HDL 46 11/05/2016   CHOLHDL 2.7 11/05/2016   VLDL 40 11/05/2016   LDLCALC 37 11/05/2016    Physical Findings: AIMS: Facial and Oral Movements Muscles of Facial Expression: None, normal Lips and Perioral Area: None, normal Jaw: None, normal Tongue: None, normal,Extremity Movements Upper (arms, wrists, hands, fingers): None, normal Lower (legs, knees, ankles, toes): None, normal, Trunk Movements Neck, shoulders, hips: None, normal, Overall Severity Severity of abnormal movements (highest score from questions above): None, normal Incapacitation due to abnormal movements: None, normal Patient's awareness of abnormal movements (rate only patient's report): No Awareness, Dental Status Current problems with teeth and/or dentures?: No Does patient usually wear dentures?: No  CIWA:  CIWA-Ar Total: 2 COWS:     Musculoskeletal: Strength & Muscle Tone: within normal limits Gait & Station: normal Patient leans: N/A  Psychiatric Specialty Exam: Physical Exam  Nursing note and vitals reviewed. Constitutional: He is oriented to person, place, and time. He appears well-developed and well-nourished.  Cardiovascular: Normal rate.   Respiratory: Effort normal.  Musculoskeletal: Normal range of motion.  Neurological: He is alert and oriented to person, place, and time.    Review of Systems  Constitutional: Negative.   HENT: Negative.   Eyes: Negative.   Respiratory: Negative.   Gastrointestinal: Negative.    Genitourinary: Negative.   Musculoskeletal: Negative.   Skin: Negative.   Neurological: Negative.   Endo/Heme/Allergies: Negative.     Blood pressure (!) 147/101, pulse 96, temperature 98 F (36.7 C), temperature source Oral, resp. rate 18, height '5\' 3"'$  (1.6 m), weight 57.2 kg (126 lb).Body mass index is 22.32 kg/m.  General Appearance: Casual  Eye Contact:  Good  Speech:  Clear and Coherent and Normal Rate  Volume:  Normal  Mood:  Depressed  Affect:  Flat  Thought Process:  Coherent and Descriptions of Associations: Intact  Orientation:  Full (Time, Place, and Person)  Thought Content:  WDL  Suicidal Thoughts:  No  Homicidal Thoughts:  No  Memory:  Immediate;   Good Recent;   Good  Judgement:  Good  Insight:  Good  Psychomotor Activity:  Normal  Concentration:  Concentration: Good and Attention Span: Good  Recall:  Good  Fund of Knowledge:  Good  Language:  Good  Akathisia:  No  Handed:  Right  AIMS (if indicated):     Assets:  Social Support  ADL's:  Intact  Cognition:  WNL  Sleep:  Number of Hours: 6.75     Treatment Plan Summary: Daily contact with patient to assess and evaluate symptoms and progress in treatment, Medication management and Plan is to:  -Continue CIWA protocol -Encourage group therapy participation -Continue Trazodone 50 mg PO QHS PRN for insomnia  Lewis Shock, FNP 11/06/2016, 3:24 PM   Agree with NP Progress Note

## 2016-11-06 NOTE — Progress Notes (Signed)
Patient ID: [name], male   DOB: [birth year], 46 y.o.   MRN: [id number]  DAR: Pt. Denies SI/HI and A/V Hallucinations. He reports sleep is fair, appetite is poor, energy level is low, and concentration is poor. He rates depression 3/10, hopelessness 3/10, and anxiety 5/10. Patient does report headache, dizziness, and lightheadedness which is possibly related to his elevated BP. Gaynelle Cage NP was notified of patient's elevated BP. Patient reports withdrawal symptoms including tremors, diarrhea, cravings, and chilling. PRN Imodium administered to patient to provide some relief. Support and encouragement provided to the patient. Scheduled medications administered to patient per physician's orders. Patient is seen in the milieu intermittently but patient keeps to himself. He is cooperative but minimal with staff at this time. Q15 minute checks are maintained for safety.

## 2016-11-06 NOTE — Progress Notes (Signed)
Recreation Therapy Notes  Date: 11/06/2016 Time: 9:30am Location: [address] Dayroom  Group Topic: Stress Management  Goal Area(s) Addresses:  Patient will verbalize importance of using healthy stress management.  Patient will identify positive emotions associated with healthy stress management.   Intervention: Stress Management  Activity :  Guided Meditation. Recreation Therapy Intern introduced the stress management technique of guided meditation. Recreation Therapy Intern read a script that allowed patients to reach into their inner "chakra." Recreation Therapy Intern played calming meditation music. Patients were to follow along as script was read to engage in the activity.  Education: Stress Management, Discharge Planning.   Education Outcome: Needs additional education  Clinical Observations/Feedback: Pt did not attend group.  Deone Omahoney, Recreation Therapy Intern  

## 2016-11-06 NOTE — BHH Counselor (Signed)
Adult Comprehensive Assessment  Patient ID: [name], male   DOB: 1971-03-20, 46 y.o.   MRN: [id number]  Information Source: Information source: Patient  Current Stressors:  Educational / Learning stressors: 11th grade. 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) Family Relationships: close to wife and 3 children who are adults Museum/gallery curator / Lack of resources (include bankruptcy): income from employment; wife's employment; private insurance Housing / Lack of housing: lives in house in [place], [place] with wife and 3 children Physical health (include injuries & life threatening diseases): "I've lost 40lbs in the past few months from drug use."  Social relationships: fair-some work friends; friends in community; strong family support; wife is primary support  Substance abuse: alcohol abuse-ongoing use for over 20 years; 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 "I've been functioning this whole time but I'm tired of using drugs and drinking. I want help now."  Bereavement / Loss: none identified.   Living/Environment/Situation:  Living Arrangements: Spouse/significant other Living conditions (as described by patient or guardian): living with wife and 3 kids How long has patient lived in current situation?: 23 years  What is atmosphere in current home: Comfortable  Family History:  Marital status: Married Number of Years Married: 57 What types of issues is patient dealing with in the relationship?: "my drinking and drug use."  Additional relationship information: "she is a great support for me."  Are you sexually active?: Yes What is your sexual orientation?: heterosexual Has your sexual activity been affected by drugs, alcohol, medication, or emotional stress?: no  Does patient have children?: Yes How many children?: 3 How is patient's relationship with their children?: 22yo boys and 24yo daughter.   Childhood History:  By whom was/is the patient  raised?: Both parents Additional childhood history information: "I had the best childhood I could ever ask for." Mother and father were married and are still married today Description of patient's relationship with caregiver when they were a child: close to both parents Patient's description of current relationship with people who raised him/her: close to both parents How were you disciplined when you got in trouble as a child/adolescent?: n/a  Does patient have siblings?: Yes Number of Siblings: 68 Description of patient's current relationship with siblings: youngest of 71 siblings: 36 boys and 3 girls. "we are all very close." no substance abuse issues.  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 but my job performance is not the same." What is the longest time patient has a held a job?: see above  Where was the patient employed at that time?: see above.  Has patient ever been in the [employer]?: No Has patient ever served in combat?: No Did You Receive Any Psychiatric Treatment/Services While in the [place]?: No Are There Guns or Other Weapons in Your  Home?: Yes Types of Guns/Weapons: gun (pt had gun in mouth prior to admission).   Are These Weapons Safely Secured?: Yes (daughter and wife took gun which is now secured and pt does not have access. )  Financial Resources:   Financial resources: Income from employment, Income from spouse, Private insurance Does patient have a representative payee or guardian?: No  Alcohol/Substance Abuse:   What has been your use of drugs/alcohol within the last 12 months?: crack cocaine for past 30 years; alcohol use-increased recently over past several months; "I'm averaging a 12pk a day."  If attempted suicide, did drugs/alcohol play a role in this?: Yes (pt was intoxicated and high on crack cocaine when he put gun in mouth and contemplated suicide prior to admission.) Alcohol/Substance Abuse Treatment Hx: Denies past history, Past Tx, Inpatient If yes, describe treatment: "[facility] I was 40 for detox and substance abuse treatment."  Has alcohol/substance abuse ever caused legal problems?: No  Social Support System:   Pensions consultant Support System: Fair Astronomer System: some coworker that are friends; few friends in community. biggest support is wife and kids Type of faith/religion: christian How does patient's faith help to cope with current illness?: prayer;   Leisure/Recreation:   Leisure and Hobbies: hunting; fishing; doing anything outdoors  Strengths/Needs:   What things does the patient do well?: motivated to stop using drugs and drinking; goal oriented and future focused; motivated to attend residential treatment "I want to do this right."  In what areas does patient struggle / problems for patient: coping with depression and SI thoughts; chronic crack cocaine and alcohol abuse-cravings.   Discharge Plan:   Does patient have access to transportation?: Yes Will patient be returning to same living situation after discharge?: No Plan for living situation after discharge: Pt is requesting residential treatment. [facility].  Currently receiving community mental health services: No If no, would patient like referral for services when discharged?: Yes (What county?) [facility] likely) Does patient have financial barriers related to discharge medications?: No  Summary/Recommendations:   Summary and Recommendations (to be completed by the evaluator): Patient is 46 yo male living in [place], [place] ([place]). Patient lives with his wife and 3 children. He presents to the hospital seeking treatment for SI with plan to shoot himself, alcohol and cocaine abuse, and for medication stabilization. patient currently denies SI/HI/AVH. He would like referral for residential treatment and will be referred to [facility]. Patient is married, employed, and has 3 adult children as primary support network. He has a diagnosis of MDD, recurrent severe, cocaine use disorder, and alcohol use disorder. Recommendations for patient include: crisis stabilization, therapeutic milieu, encourage group attendance and participation, and medication management for mood stabilization/detox., and development of comprehensive mental wellness/sobriety plan.   Kimber Relic Smart LCSW 11/06/2016 10:37 AM

## 2016-11-06 NOTE — BHH Group Notes (Signed)
[facility] LCSW Group Therapy  11/06/2016 1:13 PM  Type of Therapy:  Group Therapy  Participation Level:  Active  Participation Quality:  Attentive  Affect:  Appropriate  Cognitive:  Alert and Oriented  Insight:  Improving  Engagement in Therapy:  Improving  Modes of Intervention:  Confrontation, Discussion, Education, Problem-solving, Socialization and Support  Summary of Progress/Problems: Feelings around Relapse. Group members discussed the meaning of relapse and shared personal stories of relapse, how it affected them and others, and how they perceived themselves during this time. Group members were encouraged to identify triggers, warning signs and coping skills used when facing the possibility of relapse. Social supports were discussed and explored in detail. Post Acute Withdrawal Syndrome (handout provided) was introduced and examined. Pt's were encouraged to ask questions, talk about key points associated with PAWS, and process this information in terms of relapse prevention.   [name] Sergio Smart LCSW 11/06/2016, 1:13 PM

## 2016-11-07 ENCOUNTER — Encounter (HOSPITAL_COMMUNITY): Payer: Self-pay | Admitting: Behavioral Health

## 2016-11-07 NOTE — Progress Notes (Signed)
NSG 7a-7p shift:   D:  Pt. Has been polite and cooperative this shift.  He continues to complain of diarrhea at times with improvement after immodium.  He talked about the many losses he's endured. "I lost my parents, two brothers, and a sister all  in less than 8 months".  He said that he had been using to cope with his grief.  Pt became tearful, but was also able to express hope regarding his recovery:  "I'm going to get through this, especially with the help of my family."    A: Support, education, and encouragement provided as needed.  Level 3 checks continued for safety.  R: Pt. very receptive to intervention/s.  Safety maintained.  Prudencio Pair, RN

## 2016-11-07 NOTE — BHH Group Notes (Signed)
[facility] LCSW Group Therapy Note  10/31/2016 10:15 to 11:15 AM  Type of Therapy and Topic:  Group Therapy: Avoiding Self-Sabotaging and Enabling Behaviors  Participation Level:  Active   Description of Group The main focus of today's process group to discuss what "self-sabotage" means and use motivational iInterviewing to discuss what benefits, negative or positive, were involved in a self-identified self-sabotaging behavior. We then talked about reasons the patient may want to change the behavior and their current desire to change.   Summary of Patient Progress: Patient was alert and attentive to others and shared his story. Patient processed difficulty he has in asking for help and the reasons behind that. Offered encouragement to others.    Therapeutic molalities: Cognitive Behavioral Therapy Person-Centered Therapy Motivational Interviewing  Therapeutic Goals: 1. Patients will demonstrate understanding of the concept of self sabotage 2. Patients will be able to identify pros and cons of their behaviors 3. Patients will be able to identify at least two motivating factors for l of their desire for change   Sheilah Pigeon, LCSW

## 2016-11-07 NOTE — Progress Notes (Signed)
Pt attend wrap up group. His day was a 8. He feel great. He have not slept this good in a very long time days.

## 2016-11-07 NOTE — Progress Notes (Signed)
[facility] MD Progress Note  11/07/2016 11:08 AM [name]  MRN:  [id number]   Subjective: " I am better."  Objective: Face to face evaluation completed and chart reviewed. During this evaluation patient is alert and oriented x4, calm and cooperative. He is very pleasant on approach. He continues to endorses some mild depression,. Anxiety and hopelessness. Denies SI, HI or AVH. There are no indicators of bizarre behaviors, delusions, paranoia or other psychotic process. Continues to endorse the desire for residential treatment for his substance abuse once discharge.Conitues to endorse sweats and tremors as withdrawal symptoms with little improvement. Remains complaint with therapeutic milieu including medications and at this time he reports medications are well tolerated and without side effects. At this time, he is able to contract for safety on the unit. .      Principal Problem: Alcohol dependence with alcohol-induced mood disorder ([place]) Diagnosis:   Patient Active Problem List   Diagnosis Date Noted  . Substance induced mood disorder ([place]) [F19.94] 11/05/2016  . Alcohol dependence with alcohol-induced mood disorder ([place]) [F10.24]    Total Time spent with patient: 15 minutes  Past Psychiatric History: See H&P  Past Medical History: History reviewed. No pertinent past medical history. History reviewed. No pertinent surgical history. Family History: History reviewed. No pertinent family history. Family Psychiatric  History: See H&P Social History:  History  Alcohol use Not on file     History  Drug use: Unknown    Social History   Social History  . Marital status: Married    Spouse name: N/A  . Number of children: N/A  . Years of education: N/A   Social History Main Topics  . Smoking status: Current Every Day Smoker  . Smokeless tobacco: Never Used  . Alcohol use None  . Drug use: Unknown  . Sexual activity: Not Asked   Other Topics Concern  . None   Social History  Narrative  . None   Additional Social History:    Pain Medications: see MAR Prescriptions: see MAR Over the Counter: see MAR History of alcohol / drug use?: Yes Name of Substance 1: alcohol 1 - Age of First Use: UTA 1 - Amount (size/oz): UTA 1 - Frequency: frequent use 1 - Duration: years 1 - Last Use / Amount: 3 24 oz beers today Name of Substance 2: cocaine 2 - Age of First Use: UTA 2 - Amount (size/oz): $500 today 2 - Frequency: binging  2 - Duration: 30 yrs  2 - Last Use / Amount: today                Sleep: Good  Appetite:  Good  Current Medications: Current Facility-Administered Medications  Medication Dose Route Frequency Provider Last Rate Last Dose  . alum & mag hydroxide-simeth (MAALOX/MYLANTA) 200-200-20 MG/5ML suspension 30 mL  30 mL Oral Q4H PRN Patriciaann Clan E, PA-C      . hydrOXYzine (ATARAX/VISTARIL) tablet 25 mg  25 mg Oral Q6H PRN Patriciaann Clan E, PA-C   25 mg at 11/05/16 2149  . ibuprofen (ADVIL,MOTRIN) tablet 400 mg  400 mg Oral Q6H PRN Lindon Romp A, NP   400 mg at 11/06/16 2151  . lisinopril (PRINIVIL,ZESTRIL) tablet 10 mg  10 mg Oral Daily Money, Lowry Ram, FNP   10 mg at 11/07/16 0854  . loperamide (IMODIUM) capsule 2-4 mg  2-4 mg Oral PRN Patriciaann Clan E, PA-C   4 mg at 11/07/16 0854  . LORazepam (ATIVAN) tablet 1 mg  1  mg Oral Q6H PRN Laverle Hobby, PA-C      . LORazepam (ATIVAN) tablet 1 mg  1 mg Oral BID Laverle Hobby, PA-C   1 mg at 11/07/16 7353   Followed by  . [START ON 11/08/2016] LORazepam (ATIVAN) tablet 1 mg  1 mg Oral Daily Simon, Spencer E, PA-C      . magnesium hydroxide (MILK OF MAGNESIA) suspension 30 mL  30 mL Oral Daily PRN Laverle Hobby, PA-C      . multivitamin with minerals tablet 1 tablet  1 tablet Oral Daily Laverle Hobby, PA-C   1 tablet at 11/07/16 0854  . nicotine polacrilex (NICORETTE) gum 2 mg  2 mg Oral PRN Patriciaann Clan E, PA-C   2 mg at 11/06/16 1819  . ondansetron (ZOFRAN-ODT) disintegrating tablet  4 mg  4 mg Oral Q6H PRN Patriciaann Clan E, PA-C      . pantoprazole (PROTONIX) EC tablet 40 mg  40 mg Oral Daily Lindon Romp A, NP   40 mg at 11/07/16 0854  . thiamine (B-1) injection 100 mg  100 mg Intramuscular Once Laverle Hobby, PA-C   Stopped at 11/05/16 0030  . thiamine (VITAMIN B-1) tablet 100 mg  100 mg Oral Daily Patriciaann Clan E, PA-C   100 mg at 11/07/16 0854  . traZODone (DESYREL) tablet 50 mg  50 mg Oral QHS PRN Cobos, Myer Peer, MD   50 mg at 11/06/16 2151    Lab Results:  Results for orders placed or performed during the hospital encounter of 11/04/16 (from the past 48 hour(s))  Hemoglobin A1c     Status: None   Collection Time: 11/05/16  6:40 PM  Result Value Ref Range   Hgb A1c MFr Bld 5.6 4.8 - 5.6 %    Comment: (NOTE) Pre diabetes:          5.7%-6.4% Diabetes:              >6.4% Glycemic control for   <7.0% adults with diabetes    Mean Plasma Glucose 114.02 mg/dL    Comment: Performed at [facility], [place] [address] [place], [place] [address]  CBC with Differential/Platelet     Status: Abnormal   Collection Time: 11/05/16  6:40 PM  Result Value Ref Range   WBC 6.6 4.0 - 10.5 K/uL   RBC 4.77 4.22 - 5.81 MIL/uL   Hemoglobin 14.3 13.0 - 17.0 g/dL   HCT 41.7 39.0 - 52.0 %   MCV 87.4 78.0 - 100.0 fL   MCH 30.0 26.0 - 34.0 pg   MCHC 34.3 30.0 - 36.0 g/dL   RDW 13.3 11.5 - 15.5 %   Platelets 144 (L) 150 - 400 K/uL   Neutrophils Relative % 49 %   Neutro Abs 3.2 1.7 - 7.7 K/uL   Lymphocytes Relative 37 %   Lymphs Abs 2.5 0.7 - 4.0 K/uL   Monocytes Relative 13 %   Monocytes Absolute 0.9 0.1 - 1.0 K/uL   Eosinophils Relative 1 %   Eosinophils Absolute 0.1 0.0 - 0.7 K/uL   Basophils Relative 0 %   Basophils Absolute 0.0 0.0 - 0.1 K/uL    Comment: Performed at [facility], [place] [address] [place], [place] [address]  Ethanol     Status: None   Collection Time: 11/05/16  6:40 PM  Result Value Ref Range   Alcohol, Ethyl (B) <5 <5 mg/dL     Comment:  LOWEST DETECTABLE LIMIT FOR SERUM ALCOHOL IS 5 mg/dL FOR MEDICAL PURPOSES ONLY Performed at [facility] [address] [place], [place] [address]     Blood Alcohol level:  Lab Results  Component Value Date   ETH <5 56/21/3086    Metabolic Disorder Labs: Lab Results  Component Value Date   HGBA1C 5.6 11/05/2016   MPG 114.02 11/05/2016   Lab Results  Component Value Date   PROLACTIN 9.2 11/05/2016   Lab Results  Component Value Date   CHOL 123 11/05/2016   TRIG 200 (H) 11/05/2016   HDL 46 11/05/2016   CHOLHDL 2.7 11/05/2016   VLDL 40 11/05/2016   LDLCALC 37 11/05/2016    Physical Findings: AIMS: Facial and Oral Movements Muscles of Facial Expression: None, normal Lips and Perioral Area: None, normal Jaw: None, normal Tongue: None, normal,Extremity Movements Upper (arms, wrists, hands, fingers): None, normal Lower (legs, knees, ankles, toes): None, normal, Trunk Movements Neck, shoulders, hips: None, normal, Overall Severity Severity of abnormal movements (highest score from questions above): None, normal Incapacitation due to abnormal movements: None, normal Patient's awareness of abnormal movements (rate only patient's report): No Awareness, Dental Status Current problems with teeth and/or dentures?: No Does patient usually wear dentures?: No  CIWA:  CIWA-Ar Total: 0 COWS:     Musculoskeletal: Strength & Muscle Tone: within normal limits Gait & Station: normal Patient leans: N/A  Psychiatric Specialty Exam: Physical Exam  Nursing note and vitals reviewed. Constitutional: He is oriented to person, place, and time. He appears well-developed and well-nourished.  Cardiovascular: Normal rate.   Respiratory: Effort normal.  Musculoskeletal: Normal range of motion.  Neurological: He is alert and oriented to person, place, and time.    Review of Systems  Constitutional: Negative.   HENT: Negative.   Eyes: Negative.    Respiratory: Negative.   Gastrointestinal: Negative.   Genitourinary: Negative.   Musculoskeletal: Negative.   Skin: Negative.   Neurological: Negative.   Endo/Heme/Allergies: Negative.   Psychiatric/Behavioral: Positive for depression and substance abuse. Negative for hallucinations, memory loss and suicidal ideas. The patient is nervous/anxious. The patient does not have insomnia.   All other systems reviewed and are negative.   Blood pressure (!) 129/91, pulse 85, temperature 98.5 F (36.9 C), temperature source Oral, resp. rate 16, height 5\' 3"  (1.6 m), weight 126 lb (57.2 kg).Body mass index is 22.32 kg/m.  General Appearance: Casual  Eye Contact:  Good  Speech:  Clear and Coherent and Normal Rate  Volume:  Normal  Mood:  Depressed  Affect:  Flat  Thought Process:  Coherent and Descriptions of Associations: Intact  Orientation:  Full (Time, Place, and Person)  Thought Content:  Logical denies AVH. No preoccupations or ruminations   Suicidal Thoughts:  No  Homicidal Thoughts:  No  Memory:  Immediate;   Good Recent;   Good  Judgement:  Good  Insight:  Good  Psychomotor Activity:  Normal  Concentration:  Concentration: Good and Attention Span: Good  Recall:  Good  Fund of Knowledge:  Good  Language:  Good  Akathisia:  No  Handed:  Right  AIMS (if indicated):     Assets:  Social Support  ADL's:  Intact  Cognition:  WNL  Sleep:  Number of Hours: 6.75     Treatment Plan Summary: Reviewed current treatment plan. Will continue the follow in without adjustments;    Daily contact with patient to assess and evaluate symptoms and progress in treatment, Medication management and Plan  is to:  -Continue CIWA and Ativan protocol for withdrawal symptoms -Continue lisinopril 10 mg po daily for HTN -Continue Nicorette gum 2mg  as needed for smoking cessation -Continue to encoruiage group therapy participation -Continue Trazodone 50 mg PO QHS PRN for insomnia -Continue Vistaril  25 mg po Q6hrs as needed for anxiety, agitation or CIWA< or =10  Mordecai Maes, NP 11/07/2016, 11:08 AM   Patient ID: [name], male   DOB: [birth year], 46 y.o.   MRN: [id number]

## 2016-11-07 NOTE — Progress Notes (Signed)
Writer spoke with patient 1:1 after he had visited with his wife and attended group. He reports that he has planned on going for treatment in [place]. He reports that he did not realize that he got out of control. He was informed of his medications and c/o having withdrawal symptoms which he received medications for. Support given and safety maintained on unit with 15 min checks.

## 2016-11-08 NOTE — Progress Notes (Signed)
Patient did not attend the evening speaker [place] meeting. Pt remained in bed during group time.

## 2016-11-08 NOTE — Progress Notes (Signed)
Writer spoke with patient 1:1 after his visitors left. He reports that he feels better and is hopeful that the facility in [place] will be a good fit for him being that it will be close for his family to visit. He reports that his daughter is leaving for the [employer] and she and his wife came to visit before going to the airport. He reports that he is determined to do this for himself and his family. Support given and safety maintained on unit with 15 min checks.

## 2016-11-08 NOTE — Progress Notes (Signed)
[facility] MD Progress Note  11/08/2016 11:46 AM [name]  MRN:  [id number]   Subjective: " I'm feeling better but I don't think I'm ready to go home."  Objective: Face to face evaluation completed and chart reviewed. During this evaluation patient is alert and oriented x4, calm and cooperative. He is very pleasant on approach. He continues to endorse some mild depression. He endorses thoughts in the past of harming himself. States "I thought everyone feels like that from time to time so I have just been dealing with it." He currently denies SI, HI or AVH. There are no indicators of bizarre behaviors, delusions, paranoia or other psychotic process. Continues to endorse the desire for residential treatment for his substance abuse once discharged.  Remains complaint with therapeutic milieu including medications and at this time he reports medications are well tolerated and without side effects. At this time, he is able to contract for safety on the unit. .    Principal Problem: Alcohol dependence with alcohol-induced mood disorder ([place]) Diagnosis:   Patient Active Problem List   Diagnosis Date Noted  . Substance induced mood disorder ([place]) [F19.94] 11/05/2016  . Alcohol dependence with alcohol-induced mood disorder ([place]) [F10.24]    Total Time spent with patient: 15 minutes  Past Psychiatric History: See H&P  Past Medical History: History reviewed. No pertinent past medical history. History reviewed. No pertinent surgical history. Family History: History reviewed. No pertinent family history. Family Psychiatric  History: See H&P Social History:  History  Alcohol use Not on file     History  Drug use: Unknown    Social History   Social History  . Marital status: Married    Spouse name: N/A  . Number of children: N/A  . Years of education: N/A   Social History Main Topics  . Smoking status: Current Every Day Smoker  . Smokeless tobacco: Never Used  . Alcohol use None  . Drug use:  Unknown  . Sexual activity: Not Asked   Other Topics Concern  . None   Social History Narrative  . None   Additional Social History:    Pain Medications: see MAR Prescriptions: see MAR Over the Counter: see MAR History of alcohol / drug use?: Yes Name of Substance 1: alcohol 1 - Age of First Use: UTA 1 - Amount (size/oz): UTA 1 - Frequency: frequent use 1 - Duration: years 1 - Last Use / Amount: 3 24 oz beers today Name of Substance 2: cocaine 2 - Age of First Use: UTA 2 - Amount (size/oz): $500 today 2 - Frequency: binging  2 - Duration: 30 yrs  2 - Last Use / Amount: today                Sleep: Good  Appetite:  Good  Current Medications: Current Facility-Administered Medications  Medication Dose Route Frequency Provider Last Rate Last Dose  . alum & mag hydroxide-simeth (MAALOX/MYLANTA) 200-200-20 MG/5ML suspension 30 mL  30 mL Oral Q4H PRN Patriciaann Clan E, PA-C      . ibuprofen (ADVIL,MOTRIN) tablet 400 mg  400 mg Oral Q6H PRN Lindon Romp A, NP   400 mg at 11/07/16 2054  . lisinopril (PRINIVIL,ZESTRIL) tablet 10 mg  10 mg Oral Daily Money, Lowry Ram, FNP   10 mg at 11/08/16 0826  . magnesium hydroxide (MILK OF MAGNESIA) suspension 30 mL  30 mL Oral Daily PRN Patriciaann Clan E, PA-C      . multivitamin with minerals tablet 1 tablet  1 tablet Oral Daily Laverle Hobby, PA-C   1 tablet at 11/08/16 0825  . nicotine polacrilex (NICORETTE) gum 2 mg  2 mg Oral PRN Laverle Hobby, PA-C   2 mg at 11/06/16 1819  . pantoprazole (PROTONIX) EC tablet 40 mg  40 mg Oral Daily Lindon Romp A, NP   40 mg at 11/08/16 0825  . thiamine (B-1) injection 100 mg  100 mg Intramuscular Once Laverle Hobby, PA-C   Stopped at 11/05/16 0030  . thiamine (VITAMIN B-1) tablet 100 mg  100 mg Oral Daily Laverle Hobby, PA-C   100 mg at 11/08/16 0347  . traZODone (DESYREL) tablet 50 mg  50 mg Oral QHS PRN Cobos, Myer Peer, MD   50 mg at 11/07/16 2053    Lab Results:  No results found  for this or any previous visit (from the past 48 hour(s)).  Blood Alcohol level:  Lab Results  Component Value Date   ETH <5 42/59/5638    Metabolic Disorder Labs: Lab Results  Component Value Date   HGBA1C 5.6 11/05/2016   MPG 114.02 11/05/2016   Lab Results  Component Value Date   PROLACTIN 9.2 11/05/2016   Lab Results  Component Value Date   CHOL 123 11/05/2016   TRIG 200 (H) 11/05/2016   HDL 46 11/05/2016   CHOLHDL 2.7 11/05/2016   VLDL 40 11/05/2016   LDLCALC 37 11/05/2016    Physical Findings: AIMS: Facial and Oral Movements Muscles of Facial Expression: None, normal Lips and Perioral Area: None, normal Jaw: None, normal Tongue: None, normal,Extremity Movements Upper (arms, wrists, hands, fingers): None, normal Lower (legs, knees, ankles, toes): None, normal, Trunk Movements Neck, shoulders, hips: None, normal, Overall Severity Severity of abnormal movements (highest score from questions above): None, normal Incapacitation due to abnormal movements: None, normal Patient's awareness of abnormal movements (rate only patient's report): No Awareness, Dental Status Current problems with teeth and/or dentures?: No Does patient usually wear dentures?: No  CIWA:  CIWA-Ar Total: 0 COWS:     Musculoskeletal: Strength & Muscle Tone: within normal limits Gait & Station: normal Patient leans: N/A  Psychiatric Specialty Exam: Physical Exam  Nursing note and vitals reviewed. Constitutional: He is oriented to person, place, and time. He appears well-developed and well-nourished.  Cardiovascular: Normal rate.   Respiratory: Effort normal.  Musculoskeletal: Normal range of motion.  Neurological: He is alert and oriented to person, place, and time.    Review of Systems  Constitutional: Negative.   HENT: Negative.   Eyes: Negative.   Respiratory: Negative.   Gastrointestinal: Negative.   Genitourinary: Negative.   Musculoskeletal: Negative.   Skin: Negative.    Neurological: Negative.   Endo/Heme/Allergies: Negative.   Psychiatric/Behavioral: Positive for depression and substance abuse. Negative for hallucinations, memory loss and suicidal ideas. The patient is not nervous/anxious and does not have insomnia.   All other systems reviewed and are negative.   Blood pressure 129/89, pulse 85, temperature 97.6 F (36.4 C), temperature source Oral, resp. rate 20, height 5\' 3"  (1.6 m), weight 57.2 kg (126 lb).Body mass index is 22.32 kg/m.  General Appearance: Casual  Eye Contact:  Good  Speech:  Clear and Coherent and Normal Rate  Volume:  Normal  Mood:  Depressed  Affect:  Flat  Thought Process:  Coherent and Descriptions of Associations: Intact  Orientation:  Full (Time, Place, and Person)  Thought Content:  Logical denies AVH. No preoccupations or ruminations   Suicidal Thoughts:  No  Homicidal Thoughts:  No  Memory:  Immediate;   Good Recent;   Good  Judgement:  Good  Insight:  Good  Psychomotor Activity:  Normal  Concentration:  Concentration: Good and Attention Span: Good  Recall:  Good  Fund of Knowledge:  Good  Language:  Good  Akathisia:  No  Handed:  Right  AIMS (if indicated):     Assets:  Social Support  ADL's:  Intact  Cognition:  WNL  Sleep:  Number of Hours: 6     Treatment Plan Summary: Reviewed current treatment plan. Will continue the follow in without adjustments;  Daily contact with patient to assess and evaluate symptoms and progress in treatment.  Medication management and Plan is to:  -Continue CIWA and Ativan protocol for withdrawal symptoms -Continue lisinopril 10 mg po daily for HTN -Continue Nicorette gum 2mg  as needed for smoking cessation -Continue to encourage group therapy participation -Continue Trazodone 50 mg PO QHS PRN for insomnia -Continue Vistaril 25 mg po Q6hrs as needed for anxiety, agitation or CIWA< or =10  Discharge Plan Patient is interested in discharging to residential treatment  facility [facility] in [place] for substance abuse. Social worker to assist with disposition.   Gildardo Pounds, NP 11/08/2016, 11:46 AM

## 2016-11-08 NOTE — Progress Notes (Signed)
  D: Pt has been attending the groups and interacting with his peers. Affect is flat and mood depressed. Has participated in the groups. States that he really wants to feel better and would like to go somewhere so that he could go to a 28 day program. Pt rates his depression at a 3, hopelessness at a 5 and his anxiety at a 0. A) Pt provided with a 1:1. Given support, reassurance and praise along with encouragement. R) Denies SI and HI.

## 2016-11-08 NOTE — B[facility] Group Notes (Signed)
[facility] LCSW Group Therapy Note   11/08/2016  At 10:15 to 11:10 AM   Type of Therapy and Topic: Group Therapy: Feelings Around Returning Home & Establishing a Supportive Framework and Developing a Healthier Routine  Participation Level:  Active   Description of Group:  Patients first processed thoughts and feelings about up coming discharge. These included fears of upcoming changes, lack of change, new living environments, judgements and expectations from others and overall stigma of MH issues. We then discussed what is a supportive framework? What does it look like feel like and how do I discern it from and unhealthy non-supportive network? Learn how to cope when supports are not helpful and don't support you. Discuss what to do when your family/friends are not supportive.   Therapeutic Goals Addressed in Processing Group:  1. Patient will identify one healthy supportive network that they can use at discharge. 2. Patient will identify one factor of a supportive framework and how to tell it from an unhealthy network. 3. Patient able to identify one coping skill to use when they do not have positive supports from others. 4. Patient will demonstrate ability to communicate their needs through discussion and/or role plays.  Summary of Patient Progress:  Pt engaged easily and was supportive and encouraging of others. Patient shared his process of telling his adult children about his admit prior to the event. Patient processed need for new supports.     Sheilah Pigeon, LCSW

## 2016-11-08 NOTE — Progress Notes (Signed)
Patient has been up in the dayroom watching  tv and interacting appropriately with peers. He attended group this evening and when writer met with him 1:1 he complained of body aches and feeling anxious. He reports that he feels worst now then when he came in. He was medicated according to his symptoms and writer will monitor the effectiveness of meds given.  Patient currently denies having pain, -si/hi/a/v hall. Support and encouragement offered, safety maintained on unit, will continue to monitor.

## 2016-11-09 DIAGNOSIS — I10 Essential (primary) hypertension: Secondary | ICD-10-CM

## 2016-11-09 MED ORDER — MIRTAZAPINE 7.5 MG PO TABS
7.5000 mg | ORAL_TABLET | Freq: Every day | ORAL | Status: DC
  Administered 2016-11-09: 7.5 mg via ORAL
  Filled 2016-11-09: qty 7
  Filled 2016-11-09 (×3): qty 1

## 2016-11-09 NOTE — Progress Notes (Signed)
[facility] MD Progress Note  11/09/2016 5:53 PM [name]  MRN:  [id number]   Subjective: patient reports he continues to feel depressed, although he does acknowledge some improvement compared to admission.  Denies medication side effects.  Objective:  I have discussed case with treatment team and have met with patient . As reviewed with staff, patient continues to present with a flat, depressed affect. Denies any suicidal ideations. No current significant alcohol withdrawal- no tremors, no diaphoresis, no psychomotor agitation, denies visual disturbances Limited group and milieu participation, tends to remain in room, little interaction with peers. Polite on approach. He is future oriented, and is interested in going to a rehab .    Principal Problem: Alcohol dependence with alcohol-induced mood disorder ([place]) Diagnosis:   Patient Active Problem List   Diagnosis Date Noted  . Substance induced mood disorder ([place]) [F19.94] 11/05/2016  . Alcohol dependence with alcohol-induced mood disorder ([place]) [F10.24]    Total Time spent with patient: 20 minutes  Past Psychiatric History: See H&P  Past Medical History: History reviewed. No pertinent past medical history. History reviewed. No pertinent surgical history. Family History: History reviewed. No pertinent family history. Family Psychiatric  History: See H&P Social History:  History  Alcohol use Not on file     History  Drug use: Unknown    Social History   Social History  . Marital status: Married    Spouse name: N/A  . Number of children: N/A  . Years of education: N/A   Social History Main Topics  . Smoking status: Current Every Day Smoker  . Smokeless tobacco: Never Used  . Alcohol use None  . Drug use: Unknown  . Sexual activity: Not Asked   Other Topics Concern  . None   Social History Narrative  . None   Additional Social History:    Pain Medications: see MAR Prescriptions: see MAR Over the Counter: see  MAR History of alcohol / drug use?: Yes Name of Substance 1: alcohol 1 - Age of First Use: UTA 1 - Amount (size/oz): UTA 1 - Frequency: frequent use 1 - Duration: years 1 - Last Use / Amount: 3 24 oz beers today Name of Substance 2: cocaine 2 - Age of First Use: UTA 2 - Amount (size/oz): $500 today 2 - Frequency: binging  2 - Duration: 30 yrs  2 - Last Use / Amount: today  Sleep: Fair  Appetite:  Fair  Current Medications: Current Facility-Administered Medications  Medication Dose Route Frequency Provider Last Rate Last Dose  . alum & mag hydroxide-simeth (MAALOX/MYLANTA) 200-200-20 MG/5ML suspension 30 mL  30 mL Oral Q4H PRN Patriciaann Clan E, PA-C      . ibuprofen (ADVIL,MOTRIN) tablet 400 mg  400 mg Oral Q6H PRN Lindon Romp A, NP   400 mg at 11/08/16 2102  . lisinopril (PRINIVIL,ZESTRIL) tablet 10 mg  10 mg Oral Daily Money, Lowry Ram, FNP   10 mg at 11/09/16 0939  . magnesium hydroxide (MILK OF MAGNESIA) suspension 30 mL  30 mL Oral Daily PRN Laverle Hobby, PA-C      . multivitamin with minerals tablet 1 tablet  1 tablet Oral Daily Laverle Hobby, PA-C   1 tablet at 11/09/16 7371  . nicotine polacrilex (NICORETTE) gum 2 mg  2 mg Oral PRN Patriciaann Clan E, PA-C   2 mg at 11/06/16 1819  . pantoprazole (PROTONIX) EC tablet 40 mg  40 mg Oral Daily Lindon Romp A, NP   40 mg  at 11/09/16 0939  . thiamine (B-1) injection 100 mg  100 mg Intramuscular Once Laverle Hobby, PA-C   Stopped at 11/05/16 0030  . thiamine (VITAMIN B-1) tablet 100 mg  100 mg Oral Daily Laverle Hobby, PA-C   100 mg at 11/09/16 0263  . traZODone (DESYREL) tablet 50 mg  50 mg Oral QHS PRN Cobos, Myer Peer, MD   50 mg at 11/08/16 2102    Lab Results:  No results found for this or any previous visit (from the past 3 hour(s)).  Blood Alcohol level:  Lab Results  Component Value Date   ETH <5 78/58/8502    Metabolic Disorder Labs: Lab Results  Component Value Date   HGBA1C 5.6 11/05/2016   MPG  114.02 11/05/2016   Lab Results  Component Value Date   PROLACTIN 9.2 11/05/2016   Lab Results  Component Value Date   CHOL 123 11/05/2016   TRIG 200 (H) 11/05/2016   HDL 46 11/05/2016   CHOLHDL 2.7 11/05/2016   VLDL 40 11/05/2016   LDLCALC 37 11/05/2016    Physical Findings: AIMS: Facial and Oral Movements Muscles of Facial Expression: None, normal Lips and Perioral Area: None, normal Jaw: None, normal Tongue: None, normal,Extremity Movements Upper (arms, wrists, hands, fingers): None, normal Lower (legs, knees, ankles, toes): None, normal, Trunk Movements Neck, shoulders, hips: None, normal, Overall Severity Severity of abnormal movements (highest score from questions above): None, normal Incapacitation due to abnormal movements: None, normal Patient's awareness of abnormal movements (rate only patient's report): No Awareness, Dental Status Current problems with teeth and/or dentures?: No Does patient usually wear dentures?: No  CIWA:  CIWA-Ar Total: 0 COWS:  COWS Total Score: 3  Musculoskeletal: Strength & Muscle Tone: within normal limits Gait & Station: normal Patient leans: N/A  Psychiatric Specialty Exam: Physical Exam  Nursing note and vitals reviewed. Constitutional: He is oriented to person, place, and time. He appears well-developed and well-nourished.  Cardiovascular: Normal rate.   Respiratory: Effort normal.  Musculoskeletal: Normal range of motion.  Neurological: He is alert and oriented to person, place, and time.    Review of Systems  Constitutional: Negative.   HENT: Negative.   Eyes: Negative.   Respiratory: Negative.   Gastrointestinal: Negative.   Genitourinary: Negative.   Musculoskeletal: Negative.   Skin: Negative.   Neurological: Negative.   Endo/Heme/Allergies: Negative.   Denies chest pain, no shortness of breath, no vomiting   Blood pressure 120/77, pulse 81, temperature 98.6 F (37 C), temperature source Oral, resp. rate 16,  height _0  (1.6 m), weight 57.2 kg (126 lb).Body mass index is 22.32 kg/m.  General Appearance: Fairly Groomed  Eye Contact:  Good  Speech:  Normal Rate  Volume:  Decreased  Mood:  remains depressed , vaguely constricted in affect  Affect:  constricted  Thought Process:  Linear, associations intact   Orientation:  Other:  fully alert and attentive   Thought Content:  no hallucinations, no delusions, not internally preoccupied   Suicidal Thoughts:  No denies suicidal or self injurious ideations, denies homicidal or violent ideations   Homicidal Thoughts:  No  Memory:  recent and remote grossly intact   Judgement:  Other:  fair - improving   Insight:  Fair- improving   Psychomotor Activity:  Decreased  Concentration:  Concentration: Good and Attention Span: Good  Recall:  Good  Fund of Knowledge:  Good  Language:  Good  Akathisia:  No  Handed:  Right  AIMS (if indicated):  Assets:  Social Support  ADL's:  Intact  Cognition:  WNL  Sleep:  Number of Hours: 4.75   Assessment - patient continues to present depressed, sad, constricted in affect , but denies suicidal ideations and is future oriented, interested in going to a rehab setting at discharge. No significant alcohol WDL symptoms at this time.  Sleep fair.   Treatment Plan Summary: Treatment Plan reviewed as below today 8/13  Encourage group and milieu participation to work on coping skills and symptom reduction Encourage efforts to work on sobriety and relapse prevention  Start Remeron 7.5 mgrs QHS for depression, insomnia  Continue Lisinopril for HTN D/C Trazodone, as starting Remeron Treatment plan working on disposition planning- patient wanting to go to a rehab at discharge  Jenne Campus, MD 11/09/2016, 5:53 PM   Patient ID: [name], male   DOB: [birth year], 46 y.o.   MRN: [id number]

## 2016-11-09 NOTE — Plan of Care (Signed)
Problem: Medication: Goal: Compliance with prescribed medication regimen will improve Outcome: Progressing Patient is compliant with medications

## 2016-11-09 NOTE — Progress Notes (Signed)
Pt did not attend wrap-up group   

## 2016-11-09 NOTE — Progress Notes (Addendum)
Patient has been accepted to [facility] for Tuesday at 3:00PM.  Maxie Better, MSW, LCSW Clinical Social Worker 11/09/2016 2:35 PM   Family session completed with pt, his wife, and Engineer, mining. Pt accepted bed at [facility] of [place] for tomorrow at 3pm and must discharge by 1pm. His wife will be transporting him to the facility.  Maxie Better, MSW, LCSW Clinical Social Worker 11/09/2016 3:34 PM

## 2016-11-09 NOTE — Plan of Care (Signed)
Problem: Safety: Goal: Periods of time without injury will increase Outcome: Completed/Met Date Met: 11/09/16 Patient has not engaged in self harm, denies thoughts to do so. Denies SI.  Problem: Medication: Goal: Compliance with prescribed medication regimen will improve Outcome: Progressing Patient has continued to be med compliant.

## 2016-11-09 NOTE — Progress Notes (Signed)
D: Patient observed to be isolative, rarely up in the milieu. Frequent contacts made 1:1 throughout the morning. Patient verbalizes to this Probation officer today is a "bad day. I'm just down." Patient's affect flat, sad with depressed mood. Per self inventory and discussions with writer, rates depression at a 6/10, hopelessness and anxiety at a 5/10. Rates sleep as fair, appetite as poor, energy as low and concentration as poor.  States goal for today is to . Denies pain, physical problems.   A: Medicated per orders, no prns requested or required. Level III obs in place for safety. Emotional support offered and self inventory reviewed. Encouraged completion of Suicide Safety Plan and programming participation. Discussed POC with MD, SW.    R: Patient verbalizes understanding of POC. Patient denies SI/HI/AVH and remains safe on level III obs. Will continue to monitor closely and make verbal contact frequently.

## 2016-11-10 DIAGNOSIS — F1490 Cocaine use, unspecified, uncomplicated: Secondary | ICD-10-CM

## 2016-11-10 DIAGNOSIS — F332 Major depressive disorder, recurrent severe without psychotic features: Secondary | ICD-10-CM

## 2016-11-10 MED ORDER — MIRTAZAPINE 7.5 MG PO TABS: 7.5000 mg | ORAL_TABLET | Freq: Every day | ORAL | 0 refills | Status: DC

## 2016-11-10 MED ORDER — PANTOPRAZOLE SODIUM 40 MG PO TBEC: 40.0000 mg | DELAYED_RELEASE_TABLET | Freq: Every day | ORAL | 0 refills | Status: DC

## 2016-11-10 MED ORDER — LISINOPRIL 10 MG PO TABS: 10.0000 mg | ORAL_TABLET | Freq: Every day | ORAL | 0 refills | Status: DC

## 2016-11-10 NOTE — [facility] Group Notes (Signed)
Pt attended spiritual care group on grief and loss facilitated by chaplain Constantin Hillery   Group opened with brief discussion and psycho-social ed around grief and loss in relationships and in relation to self - identifying life patterns, circumstances, changes that cause losses. Established group norm of speaking from own life experience. Group goal of establishing open and affirming space for members to share loss and experience with grief, normalize grief experience and provide psycho social education and grief support.     

## 2016-11-10 NOTE — Tx Team (Signed)
Interdisciplinary Treatment and Diagnostic Plan Update  11/10/2016 Time of Session: 0830AM [name] MRN: [id number]  Principal Diagnosis: MDD Secondary Diagnoses: Principal Problem:   Alcohol dependence with alcohol-induced mood disorder ([place]) Active Problems:   Substance induced mood disorder ([place])   Current Medications:  Current Facility-Administered Medications  Medication Dose Route Frequency Provider Last Rate Last Dose  . alum & mag hydroxide-simeth (MAALOX/MYLANTA) 200-200-20 MG/5ML suspension 30 mL  30 mL Oral Q4H PRN Patriciaann Clan E, PA-C      . ibuprofen (ADVIL,MOTRIN) tablet 400 mg  400 mg Oral Q6H PRN Lindon Romp A, NP   400 mg at 11/08/16 2102  . lisinopril (PRINIVIL,ZESTRIL) tablet 10 mg  10 mg Oral Daily Money, Lowry Ram, FNP   10 mg at 11/10/16 0820  . magnesium hydroxide (MILK OF MAGNESIA) suspension 30 mL  30 mL Oral Daily PRN Laverle Hobby, PA-C      . mirtazapine (REMERON) tablet 7.5 mg  7.5 mg Oral QHS Cobos, Myer Peer, MD   7.5 mg at 11/09/16 2129  . multivitamin with minerals tablet 1 tablet  1 tablet Oral Daily La[place]le Hobby, PA-C   1 tablet at 11/10/16 0820  . nicotine polacrilex (NICORETTE) gum 2 mg  2 mg Oral PRN Patriciaann Clan E, PA-C   2 mg at 11/06/16 1819  . pantoprazole (PROTONIX) EC tablet 40 mg  40 mg Oral Daily Lindon Romp A, NP   40 mg at 11/10/16 0820  . thiamine (B-1) injection 100 mg  100 mg Intramuscular Once La[place]le Hobby, PA-C   Stopped at 11/05/16 0030  . thiamine (VITAMIN B-1) tablet 100 mg  100 mg Oral Daily Laverle Hobby, PA-C   100 mg at 11/10/16 0820   PTA Medications: No prescriptions prior to admission.    Patient Stressors: Marital or family conflict Substance abuse  Patient Strengths: Average or above average intelligence General fund of knowledge  Treatment Modalities: Medication Management, Group therapy, Case management,  1 to 1 session with clinician, Psychoeducation, Recreational therapy.   Physician  Treatment Plan for Primary Diagnosis: MDD Long Term Goal(s): Improvement in symptoms so as ready for discharge Improvement in symptoms so as ready for discharge   Short Term Goals: Ability to identify changes in lifestyle to reduce recurrence of condition will improve Ability to verbalize feelings will improve Compliance with prescribed medications will improve Ability to identify triggers associated with substance abuse/mental health issues will improve Ability to disclose and discuss suicidal ideas Ability to demonstrate self-control will improve Ability to identify and develop effective coping behaviors will improve  Medication Management: Evaluate patient's response, side effects, and tolerance of medication regimen.  Therapeutic Interventions: 1 to 1 sessions, Unit Group sessions and Medication administration.  Evaluation of Outcomes: Met  Physician Treatment Plan for Secondary Diagnosis: Principal Problem:   Alcohol dependence with alcohol-induced mood disorder ([place]) Active Problems:   Substance induced mood disorder ([place])  Long Term Goal(s): Improvement in symptoms so as ready for discharge Improvement in symptoms so as ready for discharge   Short Term Goals: Ability to identify changes in lifestyle to reduce recurrence of condition will improve Ability to verbalize feelings will improve Compliance with prescribed medications will improve Ability to identify triggers associated with substance abuse/mental health issues will improve Ability to disclose and discuss suicidal ideas Ability to demonstrate self-control will improve Ability to identify and develop effective coping behaviors will improve     Medication Management: Evaluate patient's response, side effects, and tolerance  of medication regimen.  Therapeutic Interventions: 1 to 1 sessions, Unit Group sessions and Medication administration.  Evaluation of Outcomes: Met  RN Treatment Plan for Primary Diagnosis:  MDD Long Term Goal(s): Knowledge of disease and therapeutic regimen to maintain health will improve  Short Term Goals: Ability to remain free from injury will improve, Ability to participate in decision making will improve and Ability to verbalize feelings will improve  Medication Management: RN will administer medications as ordered by provider, will assess and evaluate patient's response and provide education to patient for prescribed medication. RN will report any adverse and/or side effects to prescribing provider.  Therapeutic Interventions: 1 on 1 counseling sessions, Psychoeducation, Medication administration, Evaluate responses to treatment, Monitor vital signs and CBGs as ordered, Perform/monitor CIWA, COWS, AIMS and Fall Risk screenings as ordered, Perform wound care treatments as ordered.  Evaluation of Outcomes: Met  LCSW Treatment Plan for Primary Diagnosis: MDD Long Term Goal(s): Safe transition to appropriate next level of care at discharge, Engage patient in therapeutic group addressing interpersonal concerns.  Short Term Goals: Engage patient in aftercare planning with referrals and resources, Facilitate patient progression through stages of change regarding substance use diagnoses and concerns and Identify triggers associated with mental health/substance abuse issues  Therapeutic Interventions: Assess for all discharge needs, 1 to 1 time with Social worker, Explore available resources and support systems, Assess for adequacy in community support network, Educate family and significant other(s) on suicide prevention, Complete Psychosocial Assessment, Interpersonal group therapy.  Evaluation of Outcomes: Met  Progress in Treatment: Attending groups: No. New to unit. Continuing to assess.  Participating in groups: No. Taking medication as prescribed: Yes. Toleration medication: Yes. Family/Significant other contact made: No, will contact:  family member/wife if patient  consents Patient understands diagnosis: Yes. Discussing patient identified problems/goals with staff: Yes. Medical problems stabilized or resolved: Yes. Denies suicidal/homicidal ideation: No. Passive SI/Able to contract for safety on the unit.  Issues/concerns per patient self-inventory: No. Other: n/a   New problem(s) identified: No, Describe:  n/a   Patient Goal: "to get help with detox and finding a long term place to go for residential treatment. I need help to stay sober."   New Short Term/Long Term Goal(s): alcohol detox/cocaine abuse, medication management for mood stabilization; elimination of SI thoughts; development of comprehensive mental wellness/sobriety plan.   Discharge Plan or Barriers: [facility] 3[facility] per [facility] in admissions. His wife will be transporting him.   Reason for Continuation of Hospitalization: none  Estimated Length of Stay: Tuesday, 11/11/16  Attendees: Patient: 11/10/2016 8:59 AM  Physician: Dr. Parke Poisson MD; Dr. Sanjuana Letters MD 11/10/2016 8:59 AM  Nursing: Tamela Oddi; Rise Paganini RN 11/10/2016 8:59 AM  RN Care Manager: Lars Pinks CM 11/10/2016 8:59 AM  Social Worker: Maxie Better, LCSW 11/10/2016 8:59 AM  Recreational Therapist: x 11/10/2016 8:59 AM  Other: Lindell Spar NP; Darnelle Maffucci Money NP 11/10/2016 8:59 AM  Other:  11/10/2016 8:59 AM  Other: 11/10/2016 8:59 AM    Scribe for Treatment Team: [place], LCSW 11/10/2016 8:59 AM

## 2016-11-10 NOTE — Progress Notes (Signed)
D: Patient denies SI/HI or AVH and has a depressed mood with flat affect.  Pt. States he has been in bed all day, "its just been one of those days".  Pt. Spoke of his discharge plan to [place] treatment center with some enthusiasm.  His demeanor overall is pleasant and cooperative.  His interaction on the unit was minimal and he did not attend the evening wrap up group.    A: Patient given emotional support from RN. Patient encouraged to come to staff with concerns and/or questions. Patient's medication routine continued. Patient's orders and plan of care reviewed.   R: Patient remains appropriate and cooperative. Will continue to monitor patient q15 minutes for safety.

## 2016-11-10 NOTE — Discharge Summary (Signed)
Physician Discharge Summary Note  Patient:  [name] is an 46 y.o., male MRN:  [id number] DOB:  04/19/1970 Patient phone:  [phone] (home)  Patient address:   [address] [place] [address],  Total Time spent with patient: Greater than 30 minutes  Date of Admission:  11/04/2016 Date of Discharge: 11-10-16  Reason for Admission: Suicidal ideations with plans to shoot himself after a drug binge.  Principal Problem: Alcohol dependence with alcohol-induced mood disorder [facility])  Discharge Diagnoses: Patient Active Problem List   Diagnosis Date Noted  . Substance induced mood disorder ([place]) [F19.94] 11/05/2016  . Alcohol dependence with alcohol-induced mood disorder ([place]) [F10.24]    Past Psychiatric History: Alcohol use disorder, dependence  Past Medical History: History reviewed. No pertinent past medical history. History reviewed. No pertinent surgical history.  Family History: History reviewed. No pertinent family history.  Family Psychiatric  History: See H&P  Social History:  History  Alcohol use Not on file     History  Drug use: Unknown    Social History   Social History  . Marital status: Married    Spouse name: N/A  . Number of children: N/A  . Years of education: N/A   Social History Main Topics  . Smoking status: Current Every Day Smoker  . Smokeless tobacco: Never Used  . Alcohol use None  . Drug use: Unknown  . Sexual activity: Not Asked   Other Topics Concern  . None   Social History Narrative  . None   Hospital Course: (Per Md's SRA): Yardley is 46 y.o.AAM, married, lives with is family, unemployed. Background history of SUD. Presented  voluntarily in the company of his wife. Expressed thoughts of shooting himself. Just had a drug binge during which he spent $500 worth of cocaine. Reports family discord, health and job issues as current stressors. No legal issues. Has a past history of psychiatry admission over two decades ago. UDS  was positive for cocaine. BAL was negative.   After evaluation of his presenting symptoms, the medication regimen for his presenting symptoms were discussed & initiated. His UDS was positive for Cocaine. He did not receive any detoxification treatment because cocaine as of date has no established detox protocols. Nazir however, received & was discharged on; Mirtazapine 7.5 mg for depression/insomnia. He was enrolled & participated in the group counseling sessions being offered & held on this unit. He learned coping skills. He presented other significant health issues that required treatment & or monitoring. Fritz Pickerel received treatment for acid reflux & Hypertension. He tolerated his treatment regimen without any adverse effects or reactions reported.  Fahed's symptoms responded well to his treatment regimen. This is evidenced by his reports of improved mood & absence of of substance withdrawal symptoms & or suicidal ideations. He is currently mentally & medically stable to continue to continue substance abuse treatment center at the [facility] in [place] care on an outpatient basis as noted below.  He is provided with all the necessary information needed to make this appointment without problems.  The attending psychiatrist met with patient for the first time today. When assessed, Abhi says he is glad he came in here to calm down. He is motivated to get into a treatment center. Says he cannot allow his addiction ruin to him. He has been staying away from people as he sometimes feel they are critical of his choices. No hallucination in any modality. No lingering suicidal thoughts. Says his  brother has taken away his weapons. His wife is picking him up and she is driving him straight to the treatment center in [place]. No thoughts of violence. No thoughts of homicide. No craving for substances. No escalation of his stressors as reported above. No overwhelming anxiety.    The nursing staff reports that patient has been appropriate on the unit. Patient has been interacting well with peers. No behavioral issues. Patient has not voiced any suicidal thoughts. Patient has not been observed to be internally stimulated. Patient has been adherent with treatment recommendations. Patient has been tolerating his medication well.   Patient was discussed at the treatment team meeting this morning. The team members feel that patient is back to his baseline level of function. The team agrees with plan to discharge patient today to continue further substance abuse treatment at the [facility] in [place]. Aarush left [facility] with all personal belongings in no apparent distress. Transportation per wife.    Physical Findings: AIMS: Facial and Oral Movements Muscles of Facial Expression: None, normal Lips and Perioral Area: None, normal Jaw: None, normal Tongue: None, normal,Extremity Movements Upper (arms, wrists, hands, fingers): None, normal Lower (legs, knees, ankles, toes): None, normal, Trunk Movements Neck, shoulders, hips: None, normal, Overall Severity Severity of abnormal movements (highest score from questions above): None, normal Incapacitation due to abnormal movements: None, normal Patient's awareness of abnormal movements (rate only patient's report): No Awareness, Dental Status Current problems with teeth and/or dentures?: No Does patient usually wear dentures?: No  CIWA:  CIWA-Ar Total: 0 COWS:  COWS Total Score: 3  Musculoskeletal: Strength & Muscle Tone: within normal limits Gait & Station: normal Patient leans: N/A  Psychiatric Specialty Exam: Physical Exam  Constitutional: He is oriented to person, place, and time. He appears well-developed and well-nourished.  HENT:  Head: Normocephalic.  Eyes: Pupils are equal, round, and reactive to light.  Neck: Normal range of motion.  Cardiovascular:  Hx. Cardiac issues.  Respiratory: Effort  normal.  GI: Soft.  Genitourinary:  Genitourinary Comments: Deferred  Musculoskeletal: Normal range of motion.  Neurological: He is alert and oriented to person, place, and time.  Skin: Skin is warm and dry.    Review of Systems  Constitutional: Negative.   HENT: Negative.   Eyes: Negative.   Respiratory: Negative.   Cardiovascular: Negative.   Gastrointestinal: Negative.   Genitourinary: Negative.   Musculoskeletal: Negative.   Skin: Negative.   Neurological: Negative.   Endo/Heme/Allergies: Negative.   Psychiatric/Behavioral: Positive for depression (Stable) and substance abuse (Hx. Cocaine use disorder, Alcohol use disorder, dependence). Negative for hallucinations, memory loss and suicidal ideas. The patient has insomnia (Stable). The patient is not nervous/anxious.     Blood pressure 134/89, pulse 66, temperature 98.8 F (37.1 C), resp. rate 16, height '5\' 3"'$  (1.6 m), weight 57.2 kg (126 lb).Body mass index is 22.32 kg/m.  See Md's SRA   Have you used any form of tobacco in the last 30 days? (Cigarettes, Smokeless Tobacco, Cigars, and/or Pipes): Yes  Has this patient used any form of tobacco in the last 30 days? (Cigarettes, Smokeless Tobacco, Cigars, and/or Pipes): Yes, A prescription for an FDA-approved tobacco cessation medication was offered at discharge and the patient refused  Blood Alcohol level:  Lab Results  Component Value Date   [facility] <5 33/82/5053   Metabolic Disorder Labs:  Lab Results  Component Value Date   HGBA1C 5.6 11/05/2016   MPG 114.02 11/05/2016   Lab Results  Component Value Date   PROLACTIN 9.2 11/05/2016   Lab Results  Component Value Date   CHOL 123 11/05/2016   TRIG 200 (H) 11/05/2016   HDL 46 11/05/2016   CHOLHDL 2.7 11/05/2016   VLDL 40 11/05/2016   LDLCALC 37 11/05/2016   See Psychiatric Specialty Exam and Suicide Risk Assessment completed by Attending Physician prior to discharge.  Discharge destination:  Other:  [place].  Is patient on multiple antipsychotic therapies at discharge:  No   Has Patient had three or more failed trials of antipsychotic monotherapy by history:  No  Recommended Plan for Multiple Antipsychotic Therapies: NA  Allergies as of 11/10/2016   No Known Allergies     Medication List    TAKE these medications     Indication  lisinopril 10 MG tablet Commonly known as:  PRINIVIL,ZESTRIL Take 1 tablet (10 mg total) by mouth daily. For high blood pressure  Indication:  High Blood Pressure Disorder   mirtazapine 7.5 MG tablet Commonly known as:  REMERON Take 1 tablet (7.5 mg total) by mouth at bedtime. For sleep  Indication:  Major Depressive Disorder, Insomnia   pantoprazole 40 MG tablet Commonly known as:  PROTONIX Take 1 tablet (40 mg total) by mouth daily. For acid reflux  Indication:  Gastroesophageal Reflux Disease      Follow-up [facility] Follow up on 11/10/2016.   Why:  You have been accepted for admission on Tuesday at 3:00PM. Your wife will pick you up from the hospital at 1:00PM and transport you to the facility. Please make sure to have 30 days supply of medications. Thank you.  Contact information: [place] [address] Phone: [phone] Fax: [phone]         Follow-up recommendations: Activity:  As tolerated Diet: As recommended by your primary care doctor. Keep all scheduled follow-up appointments as recommended.    Comments: Patient is instructed prior to discharge to: Take all medications as prescribed by his/her mental healthcare provider. Report any adverse effects and or reactions from the medicines to his/her outpatient provider promptly. Patient has been instructed & cautioned: To not engage in alcohol and or illegal drug use while on prescription medicines. In the event of worsening symptoms, patient is instructed to call the crisis hotline, 911 and or go to the nearest ED for appropriate  evaluation and treatment of symptoms. To follow-up with his/her primary care provider for your other medical issues, concerns and or health care needs.   Signed: Encarnacion Slates, NP, PMHNP, FNP-BC 11/10/2016, 10:29 AM

## 2016-11-10 NOTE — Progress Notes (Signed)
  [facility] Adult Case Management Discharge Plan :  Will you be returning to the same living situation after discharge:  No. Pt accepted to [facility] [place].  At discharge, do you have transportation home?: Yes,  wife will pick him up at 1pm Do you have the ability to pay for your medications: Yes,  private insurance/bcbs  Release of information consent forms completed and submitted to medical records.   Patient to Follow up at: [facility] Follow up on 11/10/2016.   Why:  You have been accepted for admission on Tuesday at 3:00PM. Your wife will pick you up from the hospital at 1:00PM and transport you to the facility. Please make sure to have 30 days supply of medications. Thank you.  Contact information: [place] [address] Phone: [phone] Fax: [phone]          Next level of care provider has access to [place] and Suicide Prevention discussed: Yes,  SPE completed with pt's wife; family session also completed.   Have you used any form of tobacco in the last 30 days? (Cigarettes, Smokeless Tobacco, Cigars, and/or Pipes): Yes  Has patient been referred to the Quitline?: Patient refused referral  Patient has been referred for addiction treatment: Yes  [employer], LCSW 11/10/2016, 8:49 AM

## 2016-11-10 NOTE — BHH Suicide Risk Assessment (Signed)
[facility] Discharge Suicide Risk Assessment   Principal Problem: Substance Induced Mood Disorder Discharge Diagnoses: Substance Induced Mood Disorder Patient Active Problem List   Diagnosis Date Noted  . Substance induced mood disorder ([place]) [F19.94] 11/05/2016  . Alcohol dependence with alcohol-induced mood disorder ([place]) [F10.24]     Total Time spent with patient: 45 minutes  Musculoskeletal: Strength & Muscle Tone: within normal limits Gait & Station: normal Patient leans: N/A  Psychiatric Specialty Exam: Review of Systems  Constitutional: Negative.   HENT: Negative.   Eyes: Negative.   Respiratory: Negative.   Cardiovascular: Negative.   Gastrointestinal: Negative.   Genitourinary: Negative.   Musculoskeletal: Negative.   Skin: Negative.   Neurological: Negative.   Endo/Heme/Allergies: Negative.   Psychiatric/Behavioral: Negative for depression, hallucinations, memory loss and suicidal ideas. The patient is not nervous/anxious and does not have insomnia.     Blood pressure 134/89, pulse 66, temperature 98.8 F (37.1 C), resp. rate 16, height _0  (1.6 m), weight 57.2 kg (126 lb).Body mass index is 22.32 kg/m.  General Appearance: Neatly dressed, pleasant, engaging well and cooperative. Appropriate behavior. Not in any distress. Good relatedness. Not internally stimulated  Eye Contact::  Good  Speech:  Spontaneous, normal prosody. Normal tone and rate.   Volume:  Normal  Mood:  Euthymic  Affect:  Restricted but appropriate   Thought Process:  Linear  Orientation:  Full (Time, Place, and Person)  Thought Content:  No delusional theme. No preoccupation with violent thoughts. No negative ruminations. No obsession.  No hallucination in any modality.   Suicidal Thoughts:  No  Homicidal Thoughts:  No  Memory:  Immediate;   Good Recent;   Good Remote;   Good  Judgement:  Good  Insight:  Good  Psychomotor Activity:  Normal  Concentration:  Good  Recall:  Good  Fund of  Knowledge:Good  Language: Good  Akathisia:  Negative  Handed:    AIMS (if indicated):     Assets:  Desire for Improvement Housing Intimacy  Sleep:  Number of Hours: 4.75  Cognition: WNL  ADL's:  Intact   Clinical  Assessment::   46 y.o. AAM, married, lives with is family, unemployed. Background history of SUD. Presented  voluntarily in company of his wife. Expressed thoughts of shooting himself. Just had a drug binge during which he spent $500 worth of cocaine. Reports family discord, health and job issues as current stressors. No legal issues. Has a past history of psychiatry admission over two decades ago. UDS was positive for cocaine. BAL was negative.   Chart reviewed today. Patient discussed at team today. I met with him for the first time today. Seen today. Says he is glad he came in here to calm down. He is motivated to get into a treatment center. Says he cannot allow his addiction ruin him. He has been staying away from people as he sometimes feel they are critical of his choices. No hallucination in any modality. No lingering suicidal thoughts. Says his brother has taken away his weapons. His wife is picking him up and she is driving him straight to the treatment center. No thoughts of violence. No thoughts of homicide. No craving for substances. No escalation of his stressors as reported above. No overwhelming anxiety.   Nursing staff reports that patient has been appropriate on the unit. Patient has been interacting well with peers. No behavioral issues. Patient has not voiced any suicidal thoughts. Patient has not been observed to be internally stimulated. Patient has been  adherent with treatment recommendations. Patient has been tolerating their medication well.   Patient was discussed at team. Team members feels that patient is back to his baseline level of function. Team agrees with plan to discharge patient today.   . Demographic Factors:  Male  Loss  Factors: NA  Historical Factors: Impulsivity  Risk Reduction Factors:   Responsible for children under 48 years of age, Sense of responsibility to family, Religious beliefs about death, Living with another person, especially a relative, Positive social support, Positive therapeutic relationship and Positive coping skills or problem solving skills  Continued Clinical Symptoms:  As above  Cognitive Features That Contribute To Risk:  None    Suicide Risk:  Minimal: No identifiable suicidal ideation.  Patient is not having any thoughts of suicide at this time. Modifiable risk factors targeted during this admission includes depression and substance use. Demographical and historical risk factors cannot be modified. Patient is now engaging well. Patient is reliable and is future oriented. We have buffered patient's support structures. At this point, patient is at low risk of suicide. Patient is aware of the effects of psychoactive substances on decision making process. Patient has been provided with emergency contacts. Patient acknowledges to use resources provided if unforseen circumstances changes their current risk stratification.    [facility] Follow up on 11/10/2016.   Why:  You have been accepted for admission on Tuesday at 3:00PM. Your wife will pick you up from the hospital at 1:00PM and transport you to the facility. Please make sure to have 30 days supply of medications. Thank you.  Contact information: [place] [address] Phone: [phone] Fax: [phone]          Plan Of Care/Follow-up recommendations:  1. Continue current psychotropic medications 2. Mental health and addiction follow up as arranged.  3. Discharge in care of his family 4. Provided limited quantity of prescriptions   [place], MD 11/10/2016, 11:58 AM

## 2016-12-10 ENCOUNTER — Encounter: Payer: Self-pay | Admitting: Gastroenterology

## 2016-12-10 ENCOUNTER — Ambulatory Visit (INDEPENDENT_AMBULATORY_CARE_PROVIDER_SITE_OTHER): Payer: BLUE CROSS/BLUE SHIELD | Admitting: Gastroenterology

## 2016-12-10 ENCOUNTER — Other Ambulatory Visit: Payer: Self-pay

## 2016-12-10 DIAGNOSIS — Z1211 Encounter for screening for malignant neoplasm of colon: Secondary | ICD-10-CM | POA: Diagnosis not present

## 2016-12-10 DIAGNOSIS — R740 Nonspecific elevation of levels of transaminase and lactic acid dehydrogenase [LDH]: Secondary | ICD-10-CM

## 2016-12-10 DIAGNOSIS — R7401 Elevation of levels of liver transaminase levels: Secondary | ICD-10-CM | POA: Insufficient documentation

## 2016-12-10 DIAGNOSIS — K219 Gastro-esophageal reflux disease without esophagitis: Secondary | ICD-10-CM | POA: Diagnosis not present

## 2016-12-10 DIAGNOSIS — B1920 Unspecified viral hepatitis C without hepatic coma: Secondary | ICD-10-CM

## 2016-12-10 DIAGNOSIS — Z5111 Encounter for antineoplastic chemotherapy: Secondary | ICD-10-CM | POA: Insufficient documentation

## 2016-12-10 NOTE — Assessment & Plan Note (Addendum)
HIGH RISK.  TCS WITH POSSIBLE EGD IN NEAR FUTURE WITH PROPOFOL DUE TO POLYSUBSTANCE ABUSE/ETOH USE/PROBLEMS WITH SEDATION IN THE PAST.

## 2016-12-10 NOTE — Progress Notes (Signed)
   Subjective:    Patient ID: [name], male    DOB: Jun 11, 1970, 46 y.o.   MRN: [id number]  Lucia Gaskins, MD   HPI MAY FEEL SOB IF HE WALKS. GAINING WEIGHT ON SEROQUEL. HAS HEART APPT NEXT WEEK. HAD HEARTBURN BUT RESOLVED WITH PROTONIX. HARD TO SEDATE-WOKE UP DURING APPENDECTOMY AND ANEURYSM COILING. LAST ETOH/COCAINE AUG 2018. HAS HISTORY OF ELEVATED LIVER ENZYMES.  PT DENIES FEVER, CHILLS, HEMATOCHEZIA, , nausea, vomiting, melena, diarrhea, CHEST PAIN, CHANGE IN BOWEL IN HABITS, constipation, abdominal pain, problems swallowing, OR  heartburn or indigestion.  Past Medical History:  Diagnosis Date  . EtOH dependence ([place])    QUIT AUG 8.  Marland Kitchen GERD without esophagitis   . Hepatitis C carrier ([place])   . Polysubstance dependence ([place])    COCAINE-LAST DOSE AUG 8   Past Surgical History:  Procedure Laterality Date  . ANEURYSM COILING     AGE 13 x2  . LAPAROSCOPIC APPENDECTOMY     No Known Allergies  Current Outpatient Prescriptions  Medication Sig Dispense Refill  . lisinopril (PRINIVIL,ZESTRIL) 10 MG tablet Take 1 tablet (10 mg total) by mouth daily. For high blood pressure 30 tablet 0  . mirtazapine (REMERON) 7.5 MG tablet Take 1 tablet (7.5 mg total) by mouth at bedtime. For sleep 30 tablet 0  . pantoprazole (PROTONIX) 40 MG tablet Take 1 tablet (40 mg total) by mouth daily. For acid reflux 30 tablet 0  . QUEtiapine (SEROQUEL) 100 MG tablet Take 100 mg by mouth at bedtime.           Review of Systems     Objective:   Physical Exam  Constitutional: He is oriented to person, place, and time. He appears well-developed and well-nourished. No distress.  HENT:  Head: Normocephalic and atraumatic.  Mouth/Throat: Oropharynx is clear and moist. No oropharyngeal exudate.  Eyes: Pupils are equal, round, and reactive to light. No scleral icterus.  Neck: Normal range of motion. Neck supple.  Cardiovascular: Normal rate, regular rhythm and normal heart sounds.     Pulmonary/Chest: Effort normal and breath sounds normal. No respiratory distress.  Abdominal: Soft. Bowel sounds are normal. He exhibits no distension. There is no tenderness.  Musculoskeletal: He exhibits no edema.  Lymphadenopathy:    He has no cervical adenopathy.  Neurological: He is alert and oriented to person, place, and time.  NO FOCAL DEFICITS  Psychiatric:  SLIGHTLY ANXIOUS MOOD, NL AFFECT  Vitals reviewed.         Assessment & Plan:

## 2016-12-10 NOTE — Assessment & Plan Note (Signed)
SYMPTOMS CONTROLLED/RESOLVED.  CONTINUE PROTONIX. TAKE 30 MINUTES PRIOR TO BREAKFAST. CONTINUE TO MONITOR SYMPTOMS. 

## 2016-12-10 NOTE — Assessment & Plan Note (Addendum)
Elevated in AUG 2018. NO EVIDENCE FOR CIRRHOSIS. Most likely DUE TO ETOH/COCAINE, HEP C, NASH, LESS LIKELY HEPATITIS B. OUTSIDE LABS SHOW NL HFP Dec 02 2016.  COMPLETE LABS AND ULTRASOUND TO INCLUDE FIBROSCAN. YOUR RESULTS WILL BE AVAILABLE IN 10-14 DAYS.  FOLLOW UP IN 4 MOS.

## 2016-12-10 NOTE — Patient Instructions (Addendum)
COMPLETE LABS AND ULTRASOUND.  YOUR RESULTS WILL BE AVAILABLE IN 10-14 DAYS.  YOU WILL NEED A COLONOSCOPY BUT WE WILL MANAGE HEPATITIS C FIRST.    AVOID REFLUX TRIGGERS. SEE INFO BELOW.  CONTINUE PROTONIX. TAKE 30 MINUTES PRIOR TO BREAKFAST.  FOLLOW UP IN 4 MOS.      Lifestyle and home remedies TO MANAGE REFLUX/CHEST PAIN  You may eliminate or reduce the frequency of heartburn by making the following lifestyle changes:  . Control your weight. Being overweight is a major risk factor for heartburn and GERD. Excess pounds put pressure on your abdomen, pushing up your stomach and causing acid to back up into your esophagus.   . Eat smaller meals. 4 TO 6 MEALS A DAY. This reduces pressure on the lower esophageal sphincter, helping to prevent the valve from opening and acid from washing back into your esophagus.   D[place]us Jenny your belt. Clothes that fit tightly around your waist put pressure on your abdomen and the lower esophageal sphincter.   . Eliminate heartburn triggers. Everyone has specific triggers. Common triggers such as fatty or fried foods, spicy food, tomato sauce, carbonated beverages, alcohol, chocolate, mint, garlic, onion, caffeine and nicotine may make heartburn worse.   Marland Kitchen Avoid stooping or bending. Tying your shoes is OK. Bending over for longer periods to weed your garden isn't, especially soon after eating.   . Don't lie down after a meal. Wait at least three to four hours after eating before going to bed, and don't lie down right after eating.   Marland Kitchen PUT THE HEAD OF YOUR BED ON 6 INCH BLOCKS.   Alternative medicine . Several home remedies exist for treating GERD, but they provide only temporary relief. They include drinking baking soda (sodium bicarbonate) added to water or drinking other fluids such as baking soda mixed with cream of tartar and water.  . Although these liquids create temporary relief by neutralizing, washing away or buffering acids, eventually they  aggravate the situation by adding gas and fluid to your stomach, increasing pressure and causing more acid reflux. Further, adding more sodium to your diet may increase your blood pressure and add stress to your heart, and excessive bicarbonate ingestion can alter the acid-base balance in your body.      Low-Fat Diet BREADS, CEREALS, PASTA, RICE, DRIED PEAS, AND BEANS These products are high in carbohydrates and most are low in fat. Therefore, they can be increased in the diet as substitutes for fatty foods. They too, however, contain calories and should not be eaten in excess. Cereals can be eaten for snacks as well as for breakfast.   FRUITS AND VEGETABLES It is good to eat fruits and vegetables. Besides being sources of fiber, both are rich in vitamins and some minerals. They help you get the daily allowances of these nutrients. Fruits and vegetables can be used for snacks and desserts.  MEATS Limit lean meat, chicken, [place], and fish to no more than 6 ounces per day. Beef, Pork, and Lamb Use lean cuts of beef, pork, and lamb. Lean cuts include:  Extra-lean ground beef.  Arm roast.  Sirloin tip.  Center-cut ham.  Round steak.  Loin chops.  Rump roast.  Tenderloin.  Trim all fat off the outside of meats before cooking. It is not necessary to severely decrease the intake of red meat, but lean choices should be made. Lean meat is rich in protein and contains a highly absorbable form of iron. Premenopausal women, in particular, should avoid  reducing lean red meat because this could increase the risk for low red blood cells (iron-deficiency anemia).  Chicken and [place] These are good sources of protein. The fat of poultry can be reduced by removing the skin and underlying fat layers before cooking. Chicken and [place] can be substituted for lean red meat in the diet. Poultry should not be fried or covered with high-fat sauces. Fish and Shellfish Fish is a good source of protein. Shellfish  contain cholesterol, but they usually are low in saturated fatty acids. The preparation of fish is important. Like chicken and [place], they should not be fried or covered with high-fat sauces. EGGS Egg whites contain no fat or cholesterol. They can be eaten often. Try 1 to 2 egg whites instead of whole eggs in recipes or use egg substitutes that do not contain yolk. MILK AND DAIRY PRODUCTS Use skim or 1% milk instead of 2% or whole milk. Decrease whole milk, natural, and processed cheeses. Use nonfat or low-fat (2%) cottage cheese or low-fat cheeses made from vegetable oils. Choose nonfat or low-fat (1 to 2%) yogurt. Experiment with evaporated skim milk in recipes that call for heavy cream. Substitute low-fat yogurt or low-fat cottage cheese for sour cream in dips and salad dressings. Have at least 2 servings of low-fat dairy products, such as 2 glasses of skim (or 1%) milk each day to help get your daily calcium intake. FATS AND OILS Reduce the total intake of fats, especially saturated fat. Butterfat, lard, and beef fats are high in saturated fat and cholesterol. These should be avoided as much as possible. Vegetable fats do not contain cholesterol, but certain vegetable fats, such as coconut oil, palm oil, and palm kernel oil are very high in saturated fats. These should be limited. These fats are often used in bakery goods, processed foods, popcorn, oils, and nondairy creamers. Vegetable shortenings and some peanut butters contain hydrogenated oils, which are also saturated fats. Read the labels on these foods and check for saturated vegetable oils. Unsaturated vegetable oils and fats do not raise blood cholesterol. However, they should be limited because they are fats and are high in calories. Total fat should still be limited to 30% of your daily caloric intake. Desirable liquid vegetable oils are corn oil, cottonseed oil, olive oil, canola oil, safflower oil, soybean oil, and sunflower oil. Peanut oil  is not as good, but small amounts are acceptable. Buy a heart-healthy tub margarine that has no partially hydrogenated oils in the ingredients. Mayonnaise and salad dressings often are made from unsaturated fats, but they should also be limited because of their high calorie and fat content. Seeds, nuts, peanut butter, olives, and avocados are high in fat, but the fat is mainly the unsaturated type. These foods should be limited mainly to avoid excess calories and fat. OTHER EATING TIPS Snacks  Most sweets should be limited as snacks. They tend to be rich in calories and fats, and their caloric content outweighs their nutritional value. Some good choices in snacks are graham crackers, melba toast, soda crackers, bagels (no egg), English muffins, fruits, and vegetables. These snacks are preferable to snack crackers, [place] fries, TORTILLA CHIPS, and POTATO chips. Popcorn should be air-popped or cooked in small amounts of liquid vegetable oil. Desserts Eat fruit, low-fat yogurt, and fruit ices instead of pastries, cake, and cookies. Sherbet, angel food cake, gelatin dessert, frozen low-fat yogurt, or other frozen products that do not contain saturated fat (pure fruit juice bars, frozen ice pops) are  also acceptable.  COOKING METHODS Choose those methods that use little or no fat. They include: Poaching.  Braising.  Steaming.  Grilling.  Baking.  Stir-frying.  Broiling.  Microwaving.  Foods can be cooked in a nonstick pan without added fat, or use a nonfat cooking spray in regular cookware. Limit fried foods and avoid frying in saturated fat. Add moisture to lean meats by using water, broth, cooking wines, and other nonfat or low-fat sauces along with the cooking methods mentioned above. Soups and stews should be chilled after cooking. The fat that forms on top after a few hours in the refrigerator should be skimmed off. When preparing meals, avoid using excess salt. Salt can contribute to raising  blood pressure in some people.  EATING AWAY FROM HOME Order entres, potatoes, and vegetables without sauces or butter. When meat exceeds the size of a deck of cards (3 to 4 ounces), the rest can be taken home for another meal. Choose vegetable or fruit salads and ask for low-calorie salad dressings to be served on the side. Use dressings sparingly. Limit high-fat toppings, such as bacon, crumbled eggs, cheese, sunflower seeds, and olives. Ask for heart-healthy tub margarine instead of butter.

## 2016-12-13 ENCOUNTER — Telehealth: Payer: Self-pay | Admitting: Gastroenterology

## 2016-12-13 NOTE — Telephone Encounter (Addendum)
PLEASE CALL PT. HIS LABS SHOW HE HAS NOT BEEN EXPOSED TO HEPATITIS A OR B. HIS LIVER ENZYMES SHOWS A MILDLY ELEVATED ALT, BUT THE OTHER VALUES ARE NORMAL. HE SHOULD GET THE HEP A/B VACCINE. HIS BLOOD COUNT IS SLIGHTLY LOW AND WE SHOULD SCHEDULE HIM FOR A COLONOSCOPY/POSSIBLE UPPER ENDOSCOPY FOR ANEMIA. PT NEEDS TCS/EGD W/ MAC DUE TO PRIOR ETOH AND COCAINE USE. THE BLOOD TEST TO INDICATE IF HE HAS CIRRHOSIS OR NOT IS STILL PENDING. COMPLETE THE ULTRASOUND.

## 2016-12-14 ENCOUNTER — Other Ambulatory Visit: Payer: Self-pay

## 2016-12-14 DIAGNOSIS — D649 Anemia, unspecified: Secondary | ICD-10-CM

## 2016-12-14 MED ORDER — PEG 3350-KCL-NA BICARB-NACL 420 G PO SOLR: 4000.0000 mL | ORAL | 0 refills | Status: DC

## 2016-12-14 NOTE — Telephone Encounter (Signed)
Left message for a return call

## 2016-12-14 NOTE — Telephone Encounter (Signed)
TCS/possible EGD w/Propofol scheduled for 01/05/17 at 10:00am. Rx for prep sent to pharmacy. Will mail instructions after pre-op is scheduled.

## 2016-12-14 NOTE — Telephone Encounter (Signed)
Pt is aware of results. He is scheduled for his [place] on Wednesday. He said to go ahead with the TCS/EGD/PAC.

## 2016-12-15 NOTE — Telephone Encounter (Signed)
Tried to call pt to inform of pre-op appt 12/31/16 at 8:00am, no answer, LMOVM. Letter mailed with procedure instructions.

## 2016-12-16 ENCOUNTER — Ambulatory Visit (HOSPITAL_COMMUNITY): Payer: BLUE CROSS/BLUE SHIELD

## 2016-12-16 ENCOUNTER — Ambulatory Visit (HOSPITAL_COMMUNITY)
Admission: RE | Admit: 2016-12-16 | Discharge: 2016-12-16 | Disposition: A | Discharge status: Home / Self Care | Payer: BLUE CROSS/BLUE SHIELD | Source: Ambulatory Visit | Attending: Gastroenterology | Admitting: Gastroenterology

## 2016-12-16 DIAGNOSIS — R740 Nonspecific elevation of levels of transaminase and lactic acid dehydrogenase [LDH]: Secondary | ICD-10-CM | POA: Diagnosis not present

## 2016-12-16 DIAGNOSIS — B1920 Unspecified viral hepatitis C without hepatic coma: Secondary | ICD-10-CM | POA: Insufficient documentation

## 2016-12-16 DIAGNOSIS — R7401 Elevation of levels of liver transaminase levels: Secondary | ICD-10-CM

## 2016-12-17 NOTE — Telephone Encounter (Signed)
L/M to call.

## 2016-12-17 NOTE — Telephone Encounter (Signed)
PLEASE CALL PT. HISULTRASOUND IS NORMAL. THE BLOOD TEST TO INDICATE IF HE HAS CIRRHOSIS OR NOT IS STILL PENDING.

## 2016-12-18 NOTE — Telephone Encounter (Signed)
PT is aware.

## 2016-12-21 ENCOUNTER — Encounter: Payer: Self-pay | Admitting: *Deleted

## 2016-12-22 ENCOUNTER — Encounter: Payer: Self-pay | Admitting: Cardiovascular Disease

## 2016-12-22 ENCOUNTER — Encounter: Payer: Self-pay | Admitting: *Deleted

## 2016-12-22 ENCOUNTER — Telehealth: Payer: Self-pay | Admitting: Cardiovascular Disease

## 2016-12-22 ENCOUNTER — Ambulatory Visit (INDEPENDENT_AMBULATORY_CARE_PROVIDER_SITE_OTHER): Payer: BLUE CROSS/BLUE SHIELD | Admitting: Cardiovascular Disease

## 2016-12-22 VITALS — BP 118/78 | HR 80 | Ht 64.5 in | Wt 158.0 lb

## 2016-12-22 DIAGNOSIS — Z23 Encounter for immunization: Secondary | ICD-10-CM

## 2016-12-22 DIAGNOSIS — F1910 Other psychoactive substance abuse, uncomplicated: Secondary | ICD-10-CM

## 2016-12-22 DIAGNOSIS — I10 Essential (primary) hypertension: Secondary | ICD-10-CM

## 2016-12-22 DIAGNOSIS — R0609 Other forms of dyspnea: Secondary | ICD-10-CM

## 2016-12-22 DIAGNOSIS — R0789 Other chest pain: Secondary | ICD-10-CM | POA: Diagnosis not present

## 2016-12-22 NOTE — Telephone Encounter (Signed)
ECHO in [facility] Sept 27, 2018  [facility] at [facility] Jan 01, 2017  Arrive at 8:45

## 2016-12-22 NOTE — Progress Notes (Signed)
CARDIOLOGY CONSULT NOTE  Patient ID: [name] MRN: [id number] DOB/AGE: May 02, 1970 46 y.o.  Admit date: (Not on file) Primary Physician: Lucia Gaskins, MD Referring Physician: Cindie Laroche  Reason for Consultation: chest pain  HPI: [name] is a 46 y.o. male who is being seen today for the evaluation of chest pain at the request of Dr. Cindie Laroche.  He has a history of hypertension and tobacco use.  He was recently hospitalized for suicidal ideations with plans tissue himself after a drug binge as per records reviewed on 11/10/16. He apparently has an alcohol abuse disorder. He underwent brain aneurysm coiling several years ago at [facility]  He had reportedly been Jenny Reichmann $500 of cocaine prior to this episode.  He has apparently been experiencing some exertional dyspnea.  He said for the past year and a half, he has been experiencing progressive exertional dyspnea. He said he was rushed to the ED at [facility] about a year and a half ago and was given nitroglycerin with relief. He has shortness of breath and chest tightness when walking 20 yards. If he stops and takes some rest, he feels better. 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 He has chest tightness and diaphoresis when doing so.  He has smoked a half pack of cigarettes daily for at least 30 years.  He has had a cough but denies hemoptysis.  His mother died of lung cancer at the age of 28.  ECG performed on 12/01/16 she personally interpreted demonstrated normal sinus rhythm, heart rate 96 bpm, with very mild nonspecific T wave abnormalities.   No Known Allergies  Current Outpatient Prescriptions  Medication Sig Dispense Refill  . aspirin EC 81 MG tablet Take by mouth.    Marland Kitchen lisinopril (PRINIVIL,ZESTRIL) 10 MG tablet Take 1 tablet (10 mg total) by mouth daily. For high blood pressure 30 tablet 0  . mirtazapine (REMERON) 7.5 MG tablet Take 1 tablet (7.5 mg  total) by mouth at bedtime. For sleep 30 tablet 0  . pantoprazole (PROTONIX) 40 MG tablet Take 1 tablet (40 mg total) by mouth daily. For acid reflux 30 tablet 0  . polyethylene glycol-electrolytes (TRILYTE) 420 g solution Take 4,000 mLs by mouth as directed. 4000 mL 0  . QUEtiapine (SEROQUEL) 100 MG tablet Take 100 mg by mouth at bedtime.     No current facility-administered medications for this visit.     Past Medical History:  Diagnosis Date  . EtOH dependence ([place])    QUIT AUG 8.  Marland Kitchen GERD without esophagitis   . Hepatitis C carrier ([place])   . Polysubstance dependence ([place])    COCAINE-LAST DOSE AUG 8    Past Surgical History:  Procedure Laterality Date  . ANEURYSM COILING     AGE 35 x2  . LAPAROSCOPIC APPENDECTOMY      Social History   Social History  . Marital status: Married    Spouse name: N/A  . Number of children: N/A  . Years of education: N/A   Occupational History  . Not on file.   Social History Main Topics  . Smoking status: Current Every Day Smoker    Packs/day: 0.50    Types: Cigarettes  . Smokeless tobacco: Never Used  . Alcohol use No     Comment: quitting-none since 11/04/16  . Drug use: No  . Sexual activity: Not on file   Other Topics Concern  . Not on file  Social History Narrative   NOW ON SHORT TERM DISABILITY. 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   MARRIED-3 WITH WIFE AND 3 PREVIOUS. YOUNGEST 17, STILL AT HOME, OLDEST IS 31.     No family history of premature CAD in 1st degree relatives.  Current Meds  Medication Sig  . aspirin EC 81 MG tablet Take by mouth.  Marland Kitchen lisinopril (PRINIVIL,ZESTRIL) 10 MG tablet Take 1 tablet (10 mg total) by mouth daily. For high blood pressure  . mirtazapine (REMERON) 7.5 MG tablet Take 1 tablet (7.5 mg total) by mouth at bedtime. For sleep  . pantoprazole (PROTONIX) 40 MG tablet Take 1 tablet (40 mg total) by mouth daily. For acid reflux  . polyethylene glycol-electrolytes (TRILYTE) 420 g  solution Take 4,000 mLs by mouth as directed.  Marland Kitchen QUEtiapine (SEROQUEL) 100 MG tablet Take 100 mg by mouth at bedtime.      Review of systems complete and found to be negative unless listed above in HPI    Physical exam Blood pressure 118/78, pulse 80, height 5' 4.5" (1.638 m), weight 158 lb (71.7 kg), SpO2 97 %. General: NAD Neck: No JVD, no thyromegaly or thyroid nodule.  Lungs: Clear to auscultation bilaterally with normal respiratory effort. CV: Nondisplaced PMI. Regular rate and rhythm, normal S1/S2, no S3/S4, no murmur.  No peripheral edema.  No carotid bruit.    Abdomen: Soft, nontender, no distention.  Skin: Intact without lesions or rashes.  Neurologic: Alert and oriented x 3.  Psych: Normal affect. Extremities: No clubbing or cyanosis.  HEENT: Normal.   ECG: Most recent ECG reviewed.   Labs: Lab Results  Component Value Date/Time   K 4.1 12/10/2016 01:36 PM   BUN 14 12/10/2016 01:36 PM   CREATININE 0.94 12/10/2016 01:36 PM   ALT 54 (H) 12/10/2016 01:36 PM   TSH 1.707 11/05/2016 06:31 AM   HGB 12.7 (L) 12/10/2016 01:36 PM     Lipids: Lab Results  Component Value Date/Time   LDLCALC 37 11/05/2016 06:31 AM   CHOL 123 11/05/2016 06:31 AM   TRIG 200 (H) 11/05/2016 06:31 AM   HDL 46 11/05/2016 06:31 AM        ASSESSMENT AND PLAN:  1. Chest tightness and progressive exertional dyspnea: Given his many years of tobacco and cocaine abuse, he is at risk for ischemic heart disease. He also has hypertension. I will proceed with a nuclear myocardial perfusion imaging study to evaluate for ischemic heart disease (Lexiscan Myoview). I will order a 2-D echocardiogram with Doppler to evaluate cardiac structure, function, and regional wall motion.  2. Hypertension: Controlled on present therapy which includes lisinopril 10 mg daily. No changes to therapy.     Disposition: Follow up in 1 month  Signed: Kate Sable, M.D., F.A.C.C.  12/22/2016, 8:42 AM

## 2016-12-22 NOTE — Patient Instructions (Addendum)
Medication Instructions:  Continue all current medications.  Labwork: none  Testing/Procedures:  Your physician has requested that you have a lexiscan myoview. For further information please visit [email or web address]. Please follow instruction sheet, as given. Your physician has requested that you have an echocardiogram. Echocardiography is a painless test that uses sound waves to create images of your heart. It provides your doctor with information about the size and shape of your heart and how well your heart's chambers and valves are working. This procedure takes approximately one hour. There are no restrictions for this procedure.  Office will contact with results via phone or letter.    Follow-Up: 3 weeks   Any Other Special Instructions Will Be Listed Below (If Applicable).  If you need a refill on your cardiac medications before your next appointment, please call your pharmacy.

## 2016-12-24 ENCOUNTER — Ambulatory Visit (INDEPENDENT_AMBULATORY_CARE_PROVIDER_SITE_OTHER): Payer: BLUE CROSS/BLUE SHIELD

## 2016-12-24 ENCOUNTER — Other Ambulatory Visit: Payer: Self-pay

## 2016-12-24 DIAGNOSIS — R0789 Other chest pain: Secondary | ICD-10-CM

## 2016-12-24 DIAGNOSIS — R0609 Other forms of dyspnea: Secondary | ICD-10-CM

## 2016-12-28 ENCOUNTER — Telehealth: Payer: Self-pay | Admitting: Gastroenterology

## 2016-12-28 ENCOUNTER — Encounter: Payer: Self-pay | Admitting: Gastroenterology

## 2016-12-28 ENCOUNTER — Telehealth: Payer: Self-pay | Admitting: *Deleted

## 2016-12-28 DIAGNOSIS — B182 Chronic viral hepatitis C: Secondary | ICD-10-CM | POA: Insufficient documentation

## 2016-12-28 NOTE — Telephone Encounter (Addendum)
PT HAS HEP C G1A W/O CIRRHOSIS. DISCUSSED WITH PT IN ENDO. WILL SUBMIT [place] 10.

## 2016-12-28 NOTE — Telephone Encounter (Signed)
Notes recorded by Laurine Blazer, LPN on 17/06/9447 at 6:75 PM EDT Patient notified. Copy to pmd. Follow up scheduled for 01-13-2017 with Dr. Bronson Ing. ------  Notes recorded by Herminio Commons, MD on 12/24/2016 at 8:30 PM EDT Normal cardiac function.

## 2016-12-29 ENCOUNTER — Ambulatory Visit (INDEPENDENT_AMBULATORY_CARE_PROVIDER_SITE_OTHER): Payer: BLUE CROSS/BLUE SHIELD | Admitting: General Surgery

## 2016-12-29 ENCOUNTER — Other Ambulatory Visit: Payer: Self-pay | Admitting: General Surgery

## 2016-12-29 ENCOUNTER — Encounter: Payer: Self-pay | Admitting: General Surgery

## 2016-12-29 DIAGNOSIS — D1721 Benign lipomatous neoplasm of skin and subcutaneous tissue of right arm: Secondary | ICD-10-CM

## 2016-12-29 LAB — CBC WITH DIFFERENTIAL/PLATELET
BASOS ABS: 23 {cells}/uL (ref 0–200)
Basophils Relative: 0.3 %
EOS ABS: 289 {cells}/uL (ref 15–500)
Eosinophils Relative: 3.8 %
HEMATOCRIT: 37.8 % — AB (ref 38.5–50.0)
HEMOGLOBIN: 12.7 g/dL — AB (ref 13.2–17.1)
Lymphs Abs: 3648 cells/uL (ref 850–3900)
MCH: 28.9 pg (ref 27.0–33.0)
MCHC: 33.6 g/dL (ref 32.0–36.0)
MCV: 86.1 fL (ref 80.0–100.0)
MPV: 10.3 fL (ref 7.5–12.5)
Monocytes Relative: 12.4 %
NEUTROS ABS: 2698 {cells}/uL (ref 1500–7800)
Neutrophils Relative %: 35.5 %
Platelets: 197 10*3/uL (ref 140–400)
RBC: 4.39 10*6/uL (ref 4.20–5.80)
RDW: 13.6 % (ref 11.0–15.0)
Total Lymphocyte: 48 %
WBC: 7.6 10*3/uL (ref 3.8–10.8)
WBCMIX: 942 {cells}/uL (ref 200–950)

## 2016-12-29 LAB — COMPREHENSIVE METABOLIC PANEL
AG RATIO: 1.1 (calc) (ref 1.0–2.5)
ALBUMIN MSPROF: 4 g/dL (ref 3.6–5.1)
ALT: 54 U/L — AB (ref 9–46)
AST: 39 U/L (ref 10–40)
Alkaline phosphatase (APISO): 44 U/L (ref 40–115)
BILIRUBIN TOTAL: 0.5 mg/dL (ref 0.2–1.2)
BUN: 14 mg/dL (ref 7–25)
CALCIUM: 8.9 mg/dL (ref 8.6–10.3)
CHLORIDE: 106 mmol/L (ref 98–110)
CO2: 27 mmol/L (ref 20–32)
Creat: 0.94 mg/dL (ref 0.60–1.35)
GLOBULIN: 3.5 g/dL (ref 1.9–3.7)
Glucose, Bld: 92 mg/dL (ref 65–139)
POTASSIUM: 4.1 mmol/L (ref 3.5–5.3)
SODIUM: 140 mmol/L (ref 135–146)
TOTAL PROTEIN: 7.5 g/dL (ref 6.1–8.1)

## 2016-12-29 LAB — HCV RNA NS5A DRUG RESISTANCE

## 2016-12-29 LAB — HEPATITIS B SURFACE ANTIBODY,QUALITATIVE: HEP B S AB: NONREACTIVE

## 2016-12-29 LAB — HCV RNA,LIPA RFLX NS5A DRUG RESIST

## 2016-12-29 LAB — HEPATITIS C RNA QUANTITATIVE
HCV Quantitative Log: 5.26 Log IU/mL — ABNORMAL HIGH
HCV RNA, PCR, QN: 181000 [IU]/mL — AB

## 2016-12-29 LAB — HEPATITIS A ANTIBODY, TOTAL: HEPATITIS A AB,TOTAL: NONREACTIVE

## 2016-12-29 LAB — PROTIME-INR
INR: 1
PROTHROMBIN TIME: 10.9 s (ref 9.0–11.5)

## 2016-12-29 LAB — HEPATITIS B CORE ANTIBODY, IGM: HEP B C IGM: NONREACTIVE

## 2016-12-29 LAB — HEPATITIS B SURFACE ANTIGEN: Hepatitis B Surface Ag: NONREACTIVE

## 2016-12-29 NOTE — Patient Instructions (Signed)
Lipoma A lipoma is a noncancerous (benign) tumor that is made up of fat cells. This is a very common type of soft-tissue growth. Lipomas are usually found under the skin (subcutaneous). They may occur in any tissue of the body that contains fat. Common areas for lipomas to appear include the back, shoulders, buttocks, and thighs. Lipomas grow slowly, and they are usually painless. Most lipomas do not cause problems and do not require treatment. What are the causes? The cause of this condition is not known. What increases the risk? This condition is more likely to develop in:  People who are 40-60 years old.  People who have a family history of lipomas.  What are the signs or symptoms? A lipoma usually appears as a small, round bump under the skin. It may feel soft or rubbery, but the firmness can vary. Most lipomas are not painful. However, a lipoma may become painful if it is located in an area where it pushes on nerves. How is this diagnosed? A lipoma can usually be diagnosed with a physical exam. You may also have tests to confirm the diagnosis and to rule out other conditions. Tests may include:  Imaging tests, such as a CT scan or MRI.  Removal of a tissue sample to be looked at under a microscope (biopsy).  How is this treated? Treatment is not needed for small lipomas that are not causing problems. If a lipoma continues to get bigger or it causes problems, removal is often the best option. Lipomas can also be removed to improve appearance. Removal of a lipoma is usually done with a surgery in which the fatty cells and the surrounding capsule are removed. Most often, a medicine that numbs the area (local anesthetic) is used for this procedure. Follow these instructions at home:  Keep all follow-up visits as directed by your health care provider. This is important. Contact a health care provider if:  Your lipoma becomes larger or hard.  Your lipoma becomes painful, red, or  increasingly swollen. These could be signs of infection or a more serious condition. This information is not intended to replace advice given to you by your health care provider. Make sure you discuss any questions you have with your health care provider. Document Released: 03/06/2002 Document Revised: 08/22/2015 Document Reviewed: 03/12/2014 Elsevier Interactive Patient Education  2018 Elsevier Inc.  

## 2016-12-29 NOTE — Progress Notes (Addendum)
[facility] History and Physical  Reason for Referral: Right arm lipoma Referring Physician:  Dr. Frances Furbish, MD   Chief Complaint    Lipoma      [name] is a 46 y.o. male.  HPI: [name] is a very pleasant gentleman with history of Hep C, substance abuse, and GERD who comes in today for evaluation of a right arm mass that he reports has been there for 10-12 years.  Over that time it has grown from about < 1cm to 4cm+ and does not cause him any pain and does not limit movement. The mass does get in the way when he is working, and he occasionally hits it when working.  He denies any other personal history of cancer or skin lesion in himself. His daughter does have a cyst/ mass on her neck.    He is schedule for a colonoscopy with Dr. Oneida Alar Thursday per his report. He is doing this to potentially get treatment for the Hep C.   Past Medical History:  Diagnosis Date  . EtOH dependence ([place])    QUIT AUG 8.  Marland Kitchen GERD without esophagitis   . Hepatitis C carrier ([place])   . Polysubstance dependence ([place])    COCAINE-LAST DOSE AUG 8    Past Surgical History:  Procedure Laterality Date  . ANEURYSM COILING     AGE 70 x2  . LAPAROSCOPIC APPENDECTOMY      Family History  Problem Relation Age of Onset  . Stomach cancer Mother   . Bone cancer Sister 70  . Colon cancer Brother 41  . Colon polyps Neg Hx     Social History  Substance Use Topics  . Smoking status: Current Every Day Smoker    Packs/day: 0.50    Types: Cigarettes  . Smokeless tobacco: Never Used  . Alcohol use No     Comment: quitting-none since 11/04/16    Medications: I have reviewed the patient's current medications. Allergies as of 12/29/2016   No Known Allergies     Medication List       Accurate as of 12/29/16 10:38 AM. Always use your most recent med list.          amLODipine 10 MG tablet Commonly known as:  NORVASC Take 10 mg by mouth daily.   aspirin EC 81 MG tablet Take 81 mg  by mouth daily.   lisinopril 10 MG tablet Commonly known as:  PRINIVIL,ZESTRIL Take 1 tablet (10 mg total) by mouth daily. For high blood pressure   metoprolol tartrate 25 MG tablet Commonly known as:  LOPRESSOR Take 12.5 mg by mouth 2 (two) times daily.   mirtazapine 7.5 MG tablet Commonly known as:  REMERON Take 1 tablet (7.5 mg total) by mouth at bedtime. For sleep   naproxen sodium 220 MG tablet Commonly known as:  ANAPROX Take 220 mg by mouth 2 (two) times daily as needed (for pain.).   pantoprazole 40 MG tablet Commonly known as:  PROTONIX Take 1 tablet (40 mg total) by mouth daily. For acid reflux   polyethylene glycol-electrolytes 420 g solution Commonly known as:  TRILYTE Take 4,000 mLs by mouth as directed.   QUEtiapine 300 MG tablet Commonly known as:  SEROQUEL Take 300 mg by mouth daily.        ROS:  A comprehensive review of systems was negative except for: right arm lesion  Blood pressure (!) 142/92, pulse 90, temperature 98.6 F (37 C), resp. rate 18, height 5\' 4"  (  1.626 m), weight 158 lb (71.7 kg). Physical Exam  Constitutional: He is oriented to person, place, and time and well-developed, well-nourished, and in no distress.  Cardiovascular: Normal rate and regular rhythm.   Pulmonary/Chest: Effort normal and breath sounds normal.  Abdominal: Soft. Bowel sounds are normal.  Musculoskeletal: Normal range of motion.  Neurological: He is alert and oriented to person, place, and time. GCS score is 15.  Skin: Skin is warm and dry.  Right dorsal forearm 4cm superficial lesion, does not move with flexion or extension of wrist, nontender, mobile  Psychiatric: Mood, memory, affect and judgment normal.    Results: None    Assessment & Plan:  IRISH PIECH is a 46 y.o. 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  Given the location I am concerned that if this goes deep that it could involve  tendons or muscles and might need referral to a hand specialist.  Based on the exam, the lesion feels superficial and has no signs of attachment to tendons/ nerves, etc.  To confirm will get an [place] to assess depth.  -[place] of lipoma on right forearm  -Plan for excision of the lipoma pending the [place] results  -[place] scheduled for 10/8 with preop evaluation, and surgery 10/12  -Preop orders completed    The risk and benefits of the procedure were discussed with the patient including but not limited to the risk of bleeding, the risk of infection, and the risk of this being deep and requiring a more extensive excision.  Also discussed reason for the [place] to assess depth.  We discussed that this is likely a lipoma based on the description and location, but that it could be a lesion like a liposarcoma, and that this could require further testing/ surgery.    All questions were answered to the satisfaction of the patient. After careful consideration, [name] has decided to proceed with excision after the [place]. I will call him with the [place] results.    Virl Cagey 12/29/2016, 10:38 AM

## 2016-12-29 NOTE — H&P (Signed)
[facility] History and Physical  Reason for Referral: Right arm lipoma Referring Physician:  Dr. Frances Furbish, MD   Chief Complaint    Lipoma      [name] is a 46 y.o. male.  HPI: [name] is a very pleasant gentleman with history of Hep C, substance abuse, and GERD who comes in today for evaluation of a right arm mass that he reports has been there for 10-12 years.  Over that time it has grown from about < 1cm to 4cm+ and does not cause him any pain and does not limit movement. The mass does get in the way when he is working, and he occasionally hits it when working.  He denies any other personal history of cancer or skin lesion in himself. His daughter does have a cyst/ mass on her neck.    He is schedule for a colonoscopy with Dr. Oneida Alar Thursday per his report. He is doing this to potentially get treatment for the Hep C.   Past Medical History:  Diagnosis Date  . EtOH dependence ([place])    QUIT AUG 8.  Marland Kitchen GERD without esophagitis   . Hepatitis C carrier ([place])   . Polysubstance dependence ([place])    COCAINE-LAST DOSE AUG 8    Past Surgical History:  Procedure Laterality Date  . ANEURYSM COILING     AGE 9 x2  . LAPAROSCOPIC APPENDECTOMY      Family History  Problem Relation Age of Onset  . Stomach cancer Mother   . Bone cancer Sister 1  . Colon cancer Brother 70  . Colon polyps Neg Hx     Social History  Substance Use Topics  . Smoking status: Current Every Day Smoker    Packs/day: 0.50    Types: Cigarettes  . Smokeless tobacco: Never Used  . Alcohol use No     Comment: quitting-none since 11/04/16    Medications: I have reviewed the patient's current medications. Allergies as of 12/29/2016   No Known Allergies     Medication List       Accurate as of 12/29/16 10:38 AM. Always use your most recent med list.          amLODipine 10 MG tablet Commonly known as:  NORVASC Take 10 mg by mouth daily.   aspirin EC 81 MG tablet Take 81 mg  by mouth daily.   lisinopril 10 MG tablet Commonly known as:  PRINIVIL,ZESTRIL Take 1 tablet (10 mg total) by mouth daily. For high blood pressure   metoprolol tartrate 25 MG tablet Commonly known as:  LOPRESSOR Take 12.5 mg by mouth 2 (two) times daily.   mirtazapine 7.5 MG tablet Commonly known as:  REMERON Take 1 tablet (7.5 mg total) by mouth at bedtime. For sleep   naproxen sodium 220 MG tablet Commonly known as:  ANAPROX Take 220 mg by mouth 2 (two) times daily as needed (for pain.).   pantoprazole 40 MG tablet Commonly known as:  PROTONIX Take 1 tablet (40 mg total) by mouth daily. For acid reflux   polyethylene glycol-electrolytes 420 g solution Commonly known as:  TRILYTE Take 4,000 mLs by mouth as directed.   QUEtiapine 300 MG tablet Commonly known as:  SEROQUEL Take 300 mg by mouth daily.        ROS:  A comprehensive review of systems was negative except for: right arm lesion  Blood pressure (!) 142/92, pulse 90, temperature 98.6 F (37 C), resp. rate 18, height 5\' 4"  (  1.626 m), weight 158 lb (71.7 kg). Physical Exam  Constitutional: He is oriented to person, place, and time and well-developed, well-nourished, and in no distress.  Cardiovascular: Normal rate and regular rhythm.   Pulmonary/Chest: Effort normal and breath sounds normal.  Abdominal: Soft. Bowel sounds are normal.  Musculoskeletal: Normal range of motion.  Neurological: He is alert and oriented to person, place, and time. GCS score is 15.  Skin: Skin is warm and dry.  Right dorsal forearm 4cm superficial lesion, does not move with flexion or extension of wrist, nontender, mobile  Psychiatric: Mood, memory, affect and judgment normal.    Results: None    Assessment & Plan:  [name] is a 47 y.o. 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  Given the location I am concerned that if this goes deep that it could involve  tendons or muscles and might need referral to a hand specialist.  Based on the exam, the lesion feels superficial and has no signs of attachment to tendons/ nerves, etc.  To confirm will get an [place] to assess depth.  -[place] of lipoma on right forearm  -Plan for excision of the lipoma pending the [place] results  -[place] scheduled for 10/8 with preop evaluation, and surgery 10/12    The risk and benefits of the procedure were discussed with the patient including but not limited to the risk of bleeding, the risk of infection, and the risk of this being deep and requiring a more extensive excision.  Also discussed reason for the [place] to assess depth.  We discussed that this is likely a lipoma based on the description and location, but that it could be a lesion like a liposarcoma, and that this could require further testing/ surgery.    All questions were answered to the satisfaction of the patient. After careful consideration, [name] has decided to proceed with excision after the [place]. I will call him with the [place] results.    Virl Cagey 12/29/2016, 10:38 AM

## 2016-12-29 NOTE — Patient Instructions (Signed)
[name]  12/29/2016     @PREFPERIOPPHARMACY @   Your procedure is scheduled on  01/05/2017   Report to [facility] at  2  A.M.  Call this number if you have problems the morning of surgery:  [phone]   Remember:  Do not eat food or drink liquids after midnight.  Take these medicines the morning of surgery with A SIP OF WATER  Amlodipine, metoprolol, protonix, seroquel.   Do not wear jewelry, make-up or nail polish.  Do not wear lotions, powders, or perfumes, or deoderant.  Do not shave 48 hours prior to surgery.  Men may shave face and neck.  Do not bring valuables to the hospital.  [facility] is not responsible for any belongings or valuables.  Contacts, dentures or bridgework may not be worn into surgery.  Leave your suitcase in the car.  After surgery it may be brought to your room.  For patients admitted to the hospital, discharge time will be determined by your treatment team.  Patients discharged the day of surgery will not be allowed to drive home.   Name and phone number of your driver:   family Special instructions:  Follow the diet and prep instructions given to you by Dr Nona Dell office.  Please read over the following fact sheets that you were given. Anesthesia Post-op Instructions and Care and Recovery After Surgery       Esophagogastroduodenoscopy Esophagogastroduodenoscopy (EGD) is a procedure to examine the lining of the esophagus, stomach, and first part of the small intestine (duodenum). This procedure is done to check for problems such as inflammation, bleeding, ulcers, or growths. During this procedure, a long, flexible, lighted tube with a camera attached (endoscope) is inserted down the throat. Tell a health care provider about:  Any allergies you have.  All medicines you are taking, including vitamins, herbs, eye drops, creams, and over-the-counter medicines.  Any problems you or family members have had with  anesthetic medicines.  Any blood disorders you have.  Any surgeries you have had.  Any medical conditions you have.  Whether you are pregnant or may be pregnant. What are the risks? Generally, this is a safe procedure. However, problems may occur, including:  Infection.  Bleeding.  A tear (perforation) in the esophagus, stomach, or duodenum.  Trouble breathing.  Excessive sweating.  Spasms of the larynx.  A slowed heartbeat.  Low blood pressure.  What happens before the procedure?  Follow instructions from your health care provider about eating or drinking restrictions.  Ask your health care provider about: ? Changing or stopping your regular medicines. This is especially important if you are taking diabetes medicines or blood thinners. ? Taking medicines such as aspirin and ibuprofen. These medicines can thin your blood. Do not take these medicines before your procedure if your health care provider instructs you not to.  Plan to have someone take you home after the procedure.  If you wear dentures, be ready to remove them before the procedure. What happens during the procedure?  To reduce your risk of infection, your health care team will wash or sanitize their hands.  An IV tube will be put in a vein in your hand or arm. You will get medicines and fluids through this tube.  You will be given one or more of the following: ? A medicine to help you relax (sedative). ? A medicine to numb the area (local  anesthetic). This medicine may be sprayed into your throat. It will make you feel more comfortable and keep you from gagging or coughing during the procedure. ? A medicine for pain.  A mouth guard may be placed in your mouth to protect your teeth and to keep you from biting on the endoscope.  You will be asked to lie on your left side.  The endoscope will be lowered down your throat into your esophagus, stomach, and duodenum.  Air will be put into the endoscope.  This will help your health care provider see better.  The lining of your esophagus, stomach, and duodenum will be examined.  Your health care provider may: ? Take a tissue sample so it can be looked at in a lab (biopsy). ? Remove growths. ? Remove objects (foreign bodies) that are stuck. ? Treat any bleeding with medicines or other devices that stop tissue from bleeding. ? Widen (dilate) or stretch narrowed areas of your esophagus and stomach.  The endoscope will be taken out. The procedure may vary among health care providers and hospitals. What happens after the procedure?  Your blood pressure, heart rate, breathing rate, and blood oxygen level will be monitored often until the medicines you were given have worn off.  Do not eat or drink anything until the numbing medicine has worn off and your gag reflex has returned. This information is not intended to replace advice given to you by your health care provider. Make sure you discuss any questions you have with your health care provider. Document Released: 07/17/2004 Document Revised: 08/22/2015 Document Reviewed: 02/07/2015 Elsevier Interactive Patient Education  2018 [employer]. Esophagogastroduodenoscopy, Care After Refer to this sheet in the next few weeks. These instructions provide you with information about caring for yourself after your procedure. Your health care provider may also give you more specific instructions. Your treatment has been planned according to current medical practices, but problems sometimes occur. Call your health care provider if you have any problems or questions after your procedure. What can I expect after the procedure? After the procedure, it is common to have:  A sore throat.  Nausea.  Bloating.  Dizziness.  Fatigue.  Follow these instructions at home:  Do not eat or drink anything until the numbing medicine (local anesthetic) has worn off and your gag reflex has returned. You will know  that the local anesthetic has worn off when you can swallow comfortably.  Do not drive for 24 hours if you received a medicine to help you relax (sedative).  If your health care provider took a tissue sample for testing during the procedure, make sure to get your test results. This is your responsibility. Ask your health care provider or the department performing the test when your results will be ready.  Keep all follow-up visits as told by your health care provider. This is important. Contact a health care provider if:  You cannot stop coughing.  You are not urinating.  You are urinating less than usual. Get help right away if:  You have trouble swallowing.  You cannot eat or drink.  You have throat or chest pain that gets worse.  You are dizzy or light-headed.  You faint.  You have nausea or vomiting.  You have chills.  You have a fever.  You have severe abdominal pain.  You have black, tarry, or bloody stools. This information is not intended to replace advice given to you by your health care provider. Make sure you discuss any  questions you have with your health care provider. Document Released: 03/02/2012 Document Revised: 08/22/2015 Document Reviewed: 02/07/2015 Elsevier Interactive Patient Education  2018 [employer].  Colonoscopy, Adult A colonoscopy is an exam to look at the entire large intestine. During the exam, a lubricated, bendable tube is inserted into the anus and then passed into the rectum, colon, and other parts of the large intestine. A colonoscopy is often done as a part of normal colorectal screening or in response to certain symptoms, such as anemia, persistent diarrhea, abdominal pain, and blood in the stool. The exam can help screen for and diagnose medical problems, including:  Tumors.  Polyps.  Inflammation.  Areas of bleeding.  Tell a health care provider about:  Any allergies you have.  All medicines you are taking, including  vitamins, herbs, eye drops, creams, and over-the-counter medicines.  Any problems you or family members have had with anesthetic medicines.  Any blood disorders you have.  Any surgeries you have had.  Any medical conditions you have.  Any problems you have had passing stool. What are the risks? Generally, this is a safe procedure. However, problems may occur, including:  Bleeding.  A tear in the intestine.  A reaction to medicines given during the exam.  Infection (rare).  What happens before the procedure? Eating and drinking restrictions Follow instructions from your health care provider about eating and drinking, which may include:  A few days before the procedure - follow a low-fiber diet. Avoid nuts, seeds, dried fruit, raw fruits, and vegetables.  1-3 days before the procedure - follow a clear liquid diet. Drink only clear liquids, such as clear broth or bouillon, black coffee or tea, clear juice, clear soft drinks or sports drinks, gelatin dessert, and popsicles. Avoid any liquids that contain red or purple dye.  On the day of the procedure - do not eat or drink anything during the 2 hours before the procedure, or within the time period that your health care provider recommends.  Bowel prep If you were prescribed an oral bowel prep to clean out your colon:  Take it as told by your health care provider. Starting the day before your procedure, you will need to drink a large amount of medicated liquid. The liquid will cause you to have multiple loose stools until your stool is almost clear or light green.  If your skin or anus gets irritated from diarrhea, you may use these to relieve the irritation: ? Medicated wipes, such as adult wet wipes with aloe and vitamin E. ? A skin soothing-product like petroleum jelly.  If you vomit while drinking the bowel prep, take a break for up to 60 minutes and then begin the bowel prep again. If vomiting continues and you cannot take  the bowel prep without vomiting, call your health care provider.  General instructions  Ask your health care provider about changing or stopping your regular medicines. This is especially important if you are taking diabetes medicines or blood thinners.  Plan to have someone take you home from the hospital or clinic. What happens during the procedure?  An IV tube may be inserted into one of your veins.  You will be given medicine to help you relax (sedative).  To reduce your risk of infection: ? Your health care team will wash or sanitize their hands. ? Your anal area will be washed with soap.  You will be asked to lie on your side with your knees bent.  Your health care provider  will lubricate a long, thin, flexible tube. The tube will have a camera and a light on the end.  The tube will be inserted into your anus.  The tube will be gently eased through your rectum and colon.  Air will be delivered into your colon to keep it open. You may feel some pressure or cramping.  The camera will be used to take images during the procedure.  A small tissue sample may be removed from your body to be examined under a microscope (biopsy). If any potential problems are found, the tissue will be sent to a lab for testing.  If small polyps are found, your health care provider may remove them and have them checked for cancer cells.  The tube that was inserted into your anus will be slowly removed. The procedure may vary among health care providers and hospitals. What happens after the procedure?  Your blood pressure, heart rate, breathing rate, and blood oxygen level will be monitored until the medicines you were given have worn off.  Do not drive for 24 hours after the exam.  You may have a small amount of blood in your stool.  You may pass gas and have mild abdominal cramping or bloating due to the air that was used to inflate your colon during the exam.  It is up to you to get the  results of your procedure. Ask your health care provider, or the department performing the procedure, when your results will be ready. This information is not intended to replace advice given to you by your health care provider. Make sure you discuss any questions you have with your health care provider. Document Released: 03/13/2000 Document Revised: 01/15/2016 Document Reviewed: 05/28/2015 Elsevier Interactive Patient Education  2018 [employer].  Colonoscopy, Adult, Care After This sheet gives you information about how to care for yourself after your procedure. Your health care provider may also give you more specific instructions. If you have problems or questions, contact your health care provider. What can I expect after the procedure? After the procedure, it is common to have:  A small amount of blood in your stool for 24 hours after the procedure.  Some gas.  Mild abdominal cramping or bloating.  Follow these instructions at home: General instructions   For the first 24 hours after the procedure: ? Do not drive or use machinery. ? Do not sign important documents. ? Do not drink alcohol. ? Do your regular daily activities at a slower pace than normal. ? Eat soft, easy-to-digest foods. ? Rest often.  Take over-the-counter or prescription medicines only as told by your health care provider.  It is up to you to get the results of your procedure. Ask your health care provider, or the department performing the procedure, when your results will be ready. Relieving cramping and bloating  Try walking around when you have cramps or feel bloated.  Apply heat to your abdomen as told by your health care provider. Use a heat source that your health care provider recommends, such as a moist heat pack or a heating pad. ? Place a towel between your skin and the heat source. ? Leave the heat on for 20-30 minutes. ? Remove the heat if your skin turns bright red. This is especially  important if you are unable to feel pain, heat, or cold. You may have a greater risk of getting burned. Eating and drinking  Drink enough fluid to keep your urine clear or pale yellow.  Resume  your normal diet as instructed by your health care provider. Avoid heavy or fried foods that are hard to digest.  Avoid drinking alcohol for as long as instructed by your health care provider. Contact a health care provider if:  You have blood in your stool 2-3 days after the procedure. Get help right away if:  You have more than a small spotting of blood in your stool.  You pass large blood clots in your stool.  Your abdomen is swollen.  You have nausea or vomiting.  You have a fever.  You have increasing abdominal pain that is not relieved with medicine. This information is not intended to replace advice given to you by your health care provider. Make sure you discuss any questions you have with your health care provider. Document Released: 10/29/2003 Document Revised: 12/09/2015 Document Reviewed: 05/28/2015 Elsevier Interactive Patient Education  2018 [place] Anesthesia is a term that refers to techniques, procedures, and medicines that help a person stay safe and comfortable during a medical procedure. Monitored anesthesia care, or sedation, is one type of anesthesia. Your anesthesia specialist may recommend sedation if you will be having a procedure that does not require you to be unconscious, such as:  Cataract surgery.  A dental procedure.  A biopsy.  A colonoscopy.  During the procedure, you may receive a medicine to help you relax (sedative). There are three levels of sedation:  Mild sedation. At this level, you may feel awake and relaxed. You will be able to follow directions.  Moderate sedation. At this level, you will be sleepy. You may not remember the procedure.  Deep sedation. At this level, you will be asleep. You will not remember  the procedure.  The more medicine you are given, the deeper your level of sedation will be. Depending on how you respond to the procedure, the anesthesia specialist may change your level of sedation or the type of anesthesia to fit your needs. An anesthesia specialist will monitor you closely during the procedure. Let your health care provider know about:  Any allergies you have.  All medicines you are taking, including vitamins, herbs, eye drops, creams, and over-the-counter medicines.  Any use of steroids (by mouth or as a cream).  Any problems you or family members have had with sedatives and anesthetic medicines.  Any blood disorders you have.  Any surgeries you have had.  Any medical conditions you have, such as sleep apnea.  Whether you are pregnant or may be pregnant.  Any use of cigarettes, alcohol, or street drugs. What are the risks? Generally, this is a safe procedure. However, problems may occur, including:  Getting too much medicine (oversedation).  Nausea.  Allergic reaction to medicines.  Trouble breathing. If this happens, a breathing tube may be used to help with breathing. It will be removed when you are awake and breathing on your own.  Heart trouble.  Lung trouble.  Before the procedure Staying hydrated Follow instructions from your health care provider about hydration, which may include:  Up to 2 hours before the procedure - you may continue to drink clear liquids, such as water, clear fruit juice, black coffee, and plain tea.  Eating and drinking restrictions Follow instructions from your health care provider about eating and drinking, which may include:  8 hours before the procedure - stop eating heavy meals or foods such as meat, fried foods, or fatty foods.  6 hours before the procedure - stop eating light meals  or foods, such as toast or cereal.  6 hours before the procedure - stop drinking milk or drinks that contain milk.  2 hours before  the procedure - stop drinking clear liquids.  Medicines Ask your health care provider about:  Changing or stopping your regular medicines. This is especially important if you are taking diabetes medicines or blood thinners.  Taking medicines such as aspirin and ibuprofen. These medicines can thin your blood. Do not take these medicines before your procedure if your health care provider instructs you not to.  Tests and exams  You will have a physical exam.  You may have blood tests done to show: ? How well your kidneys and liver are working. ? How well your blood can clot.  General instructions  Plan to have someone take you home from the hospital or clinic.  If you will be going home right after the procedure, plan to have someone with you for 24 hours.  What happens during the procedure?  Your blood pressure, heart rate, breathing, level of pain and overall condition will be monitored.  An IV tube will be inserted into one of your veins.  Your anesthesia specialist will give you medicines as needed to keep you comfortable during the procedure. This may mean changing the level of sedation.  The procedure will be performed. After the procedure  Your blood pressure, heart rate, breathing rate, and blood oxygen level will be monitored until the medicines you were given have worn off.  Do not drive for 24 hours if you received a sedative.  You may: ? Feel sleepy, clumsy, or nauseous. ? Feel forgetful about what happened after the procedure. ? Have a sore throat if you had a breathing tube during the procedure. ? Vomit. This information is not intended to replace advice given to you by your health care provider. Make sure you discuss any questions you have with your health care provider. Document Released: 12/10/2004 Document Revised: 08/23/2015 Document Reviewed: 07/07/2015 Elsevier Interactive Patient Education  2018 [place], Care  After These instructions provide you with information about caring for yourself after your procedure. Your health care provider may also give you more specific instructions. Your treatment has been planned according to current medical practices, but problems sometimes occur. Call your health care provider if you have any problems or questions after your procedure. What can I expect after the procedure? After your procedure, it is common to:  Feel sleepy for several hours.  Feel clumsy and have poor balance for several hours.  Feel forgetful about what happened after the procedure.  Have poor judgment for several hours.  Feel nauseous or vomit.  Have a sore throat if you had a breathing tube during the procedure.  Follow these instructions at home: For at least 24 hours after the procedure:   Do not: ? Participate in activities in which you could fall or become injured. ? Drive. ? Use heavy machinery. ? Drink alcohol. ? Take sleeping pills or medicines that cause drowsiness. ? Make important decisions or sign legal documents. ? Take care of children on your own.  Rest. Eating and drinking  Follow the diet that is recommended by your health care provider.  If you vomit, drink water, juice, or soup when you can drink without vomiting.  Make sure you have little or no nausea before eating solid foods. General instructions  Have a responsible adult stay with you until you are awake and alert.  Take  over-the-counter and prescription medicines only as told by your health care provider.  If you smoke, do not smoke without supervision.  Keep all follow-up visits as told by your health care provider. This is important. Contact a health care provider if:  You keep feeling nauseous or you keep vomiting.  You feel light-headed.  You develop a rash.  You have a fever. Get help right away if:  You have trouble breathing. This information is not intended to replace advice  given to you by your health care provider. Make sure you discuss any questions you have with your health care provider. Document Released: 07/07/2015 Document Revised: 11/06/2015 Document Reviewed: 07/07/2015 Elsevier Interactive Patient Education  [employer].

## 2016-12-30 NOTE — Patient Instructions (Signed)
[name]  12/30/2016     @PREFPERIOPPHARMACY @   Your procedure is scheduled on 01/08/2017.  Report to [facility] at 7:30 A.M.  Call this number if you have problems the morning of surgery:  [phone]   Remember:  Do not eat food or drink liquids after midnight.  Take these medicines the morning of surgery with A SIP OF WATER Amlodipine, Lisonopril, Metoprolol, Remeron, Protonix   Do not wear jewelry, make-up or nail polish.  Do not wear lotions, powders, or perfumes, or deoderant.  Do not shave 48 hours prior to surgery.  Men may shave face and neck.  Do not bring valuables to the hospital.  [facility] is not responsible for any belongings or valuables.  Contacts, dentures or bridgework may not be worn into surgery.  Leave your suitcase in the car.  After surgery it may be brought to your room.  For patients admitted to the hospital, discharge time will be determined by your treatment team.  Patients discharged the day of surgery will not be allowed to drive home.    Please read over the following fact sheets that you were given. Surgical Site Infection Prevention and Anesthesia Post-op Instructions     PATIENT INSTRUCTIONS POST-ANESTHESIA  IMMEDIATELY FOLLOWING SURGERY:  Do not drive or operate machinery for the first twenty four hours after surgery.  Do not make any important decisions for twenty four hours after surgery or while taking narcotic pain medications or sedatives.  If you develop intractable nausea and vomiting or a severe headache please notify your doctor immediately.  FOLLOW-UP:  Please make an appointment with your surgeon as instructed. You do not need to follow up with anesthesia unless specifically instructed to do so.  WOUND CARE INSTRUCTIONS (if applicable):  Keep a dry clean dressing on the anesthesia/puncture wound site if there is drainage.  Once the wound has quit draining you may leave it open to air.  Generally you should leave the  bandage intact for twenty four hours unless there is drainage.  If the epidural site drains for more than 36-48 hours please call the anesthesia department.  QUESTIONS?:  Please feel free to call your physician or the hospital operator if you have any questions, and they will be happy to assist you.      Lipoma Removal Lipoma removal is a surgical procedure to remove a noncancerous (benign) tumor that is made up of fat cells (lipoma). Most lipomas are small and painless and do not require treatment. They can form in many areas of the body but are most common under the skin of the back, shoulders, arms, and thighs. You may need lipoma removal if you have a lipoma that is large, growing, or causing discomfort. Lipoma removal may also be done for cosmetic reasons. Tell a health care provider about:  Any allergies you have.  All medicines you are taking, including vitamins, herbs, eye drops, creams, and over-the-counter medicines.  Any problems you or family members have had with anesthetic medicines.  Any blood disorders you have.  Any surgeries you have had.  Any medical conditions you have.  Whether you are pregnant or may be pregnant. What are the risks? Generally, this is a safe procedure. However, problems may occur, including:  Infection.  Bleeding.  Allergic reactions to medicines.  Damage to nerves or blood vessels near the lipoma.  Scarring.  What happens before the procedure? Staying hydrated Follow instructions from your health care provider about hydration, which  may include:  Up to 2 hours before the procedure - you may continue to drink clear liquids, such as water, clear fruit juice, black coffee, and plain tea.  Eating and drinking restrictions Follow instructions from your health care provider about eating and drinking, which may include:  8 hours before the procedure - stop eating heavy meals or foods such as meat, fried foods, or fatty foods.  6 hours  before the procedure - stop eating light meals or foods, such as toast or cereal.  6 hours before the procedure - stop drinking milk or drinks that contain milk.  2 hours before the procedure - stop drinking clear liquids.  Medicines  Ask your health care provider about: ? Changing or stopping your regular medicines. This is especially important if you are taking diabetes medicines or blood thinners. ? Taking medicines such as aspirin and ibuprofen. These medicines can thin your blood. Do not take these medicines before your procedure if your health care provider instructs you not to.  You may be given antibiotic medicine to help prevent infection. General instructions  Ask your health care provider how your surgical site will be marked or identified.  You will have a physical exam. Your health care provider will check the size of the lipoma and whether it can be moved easily.  You may have imaging tests, such as: ? X-rays. ? CT scan. ? MRI.  Plan to have someone take you home from the hospital or clinic. What happens during the procedure?  To reduce your risk of infection: ? Your health care team will wash or sanitize their hands. ? Your skin will be washed with soap.  You will be given one or more of the following: ? A medicine to help you relax (sedative). ? A medicine to numb the area (local anesthetic). ? A medicine to make you fall asleep (general anesthetic). ? A medicine that is injected into an area of your body to numb everything below the injection site (regional anesthetic).  An incision will be made over the lipoma or very near the lipoma. The incision may be made in a natural skin line or crease.  Tissues, nerves, and blood vessels near the lipoma will be moved out of the way.  The lipoma and the capsule that surrounds it will be separated from the surrounding tissues.  The lipoma will be removed.  The incision may be closed with stitches (sutures).  A  bandage (dressing) will be placed over the incision. What happens after the procedure?  Do not drive for 24 hours if you received a sedative.  Your blood pressure, heart rate, breathing rate, and blood oxygen level will be monitored until the medicines you were given have worn off. This information is not intended to replace advice given to you by your health care provider. Make sure you discuss any questions you have with your health care provider. Document Released: 05/30/2015 Document Revised: 08/22/2015 Document Reviewed: 05/30/2015 Elsevier Interactive Patient Education  [employer].

## 2016-12-31 ENCOUNTER — Other Ambulatory Visit: Payer: Self-pay

## 2016-12-31 ENCOUNTER — Encounter (HOSPITAL_COMMUNITY): Payer: Self-pay

## 2016-12-31 ENCOUNTER — Encounter (HOSPITAL_COMMUNITY)
Admission: RE | Admit: 2016-12-31 | Discharge: 2016-12-31 | Disposition: A | Discharge status: Home / Self Care | Payer: BLUE CROSS/BLUE SHIELD | Source: Ambulatory Visit | Attending: Gastroenterology | Admitting: Gastroenterology

## 2016-12-31 DIAGNOSIS — R9431 Abnormal electrocardiogram [ECG] [EKG]: Secondary | ICD-10-CM | POA: Insufficient documentation

## 2016-12-31 DIAGNOSIS — Z01818 Encounter for other preprocedural examination: Secondary | ICD-10-CM | POA: Insufficient documentation

## 2016-12-31 HISTORY — DX: Pure hypercholesterolemia, unspecified: E78.00

## 2016-12-31 HISTORY — DX: Cerebral infarction, unspecified: I63.9

## 2016-12-31 HISTORY — DX: Anxiety disorder, unspecified: F41.9

## 2016-12-31 HISTORY — DX: Essential (primary) hypertension: I10

## 2016-12-31 NOTE — Progress Notes (Signed)
Pt is aware.  

## 2017-01-01 ENCOUNTER — Encounter (HOSPITAL_BASED_OUTPATIENT_CLINIC_OR_DEPARTMENT_OTHER)
Admission: RE | Admit: 2017-01-01 | Discharge: 2017-01-01 | Disposition: A | Discharge status: Home / Self Care | Payer: BLUE CROSS/BLUE SHIELD | Source: Ambulatory Visit | Attending: Cardiovascular Disease | Admitting: Cardiovascular Disease

## 2017-01-01 ENCOUNTER — Encounter (HOSPITAL_COMMUNITY)
Admission: RE | Admit: 2017-01-01 | Discharge: 2017-01-01 | Disposition: A | Discharge status: Home / Self Care | Payer: BLUE CROSS/BLUE SHIELD | Source: Ambulatory Visit | Attending: Cardiovascular Disease | Admitting: Cardiovascular Disease

## 2017-01-01 ENCOUNTER — Encounter (HOSPITAL_COMMUNITY): Payer: Self-pay

## 2017-01-01 DIAGNOSIS — R0609 Other forms of dyspnea: Secondary | ICD-10-CM | POA: Diagnosis present

## 2017-01-01 DIAGNOSIS — R0789 Other chest pain: Secondary | ICD-10-CM

## 2017-01-01 MED ORDER — TECHNETIUM TC 99M TETROFOSMIN IV KIT
30.0000 | PACK | Freq: Once | INTRAVENOUS | Status: AC | PRN
  Administered 2017-01-01: 30 via INTRAVENOUS

## 2017-01-01 MED ORDER — REGADENOSON 0.4 MG/5ML IV SOLN
INTRAVENOUS | Status: AC
  Administered 2017-01-01: 0.4 mg via INTRAVENOUS
  Filled 2017-01-01: qty 5

## 2017-01-01 MED ORDER — TECHNETIUM TC 99M TETROFOSMIN IV KIT
10.0000 | PACK | Freq: Once | INTRAVENOUS | Status: AC | PRN
  Administered 2017-01-01: 11 via INTRAVENOUS

## 2017-01-01 MED ORDER — SODIUM CHLORIDE 0.9% FLUSH
INTRAVENOUS | Status: AC
  Administered 2017-01-01: 10 mL via INTRAVENOUS
  Filled 2017-01-01: qty 10

## 2017-01-04 ENCOUNTER — Encounter (HOSPITAL_COMMUNITY)
Admission: RE | Admit: 2017-01-04 | Discharge: 2017-01-04 | Disposition: A | Discharge status: Home / Self Care | Payer: BLUE CROSS/BLUE SHIELD | Source: Ambulatory Visit | Attending: General Surgery | Admitting: General Surgery

## 2017-01-04 ENCOUNTER — Ambulatory Visit (HOSPITAL_COMMUNITY)
Admission: RE | Admit: 2017-01-04 | Discharge: 2017-01-04 | Disposition: A | Discharge status: Home / Self Care | Payer: BLUE CROSS/BLUE SHIELD | Source: Ambulatory Visit | Attending: General Surgery | Admitting: General Surgery

## 2017-01-04 DIAGNOSIS — D1721 Benign lipomatous neoplasm of skin and subcutaneous tissue of right arm: Secondary | ICD-10-CM | POA: Diagnosis present

## 2017-01-04 LAB — NM MYOCAR MULTI W/SPECT W/WALL MOTION / EF
CHL CUP NUCLEAR SDS: 0
CHL CUP NUCLEAR SRS: 0
CHL CUP NUCLEAR SSS: 0
CHL CUP RESTING HR STRESS: 65 {beats}/min
LHR: 0.5
LV dias vol: 101 mL (ref 62–150)
LV sys vol: 35 mL
NUC STRESS TID: 1.11
Peak HR: 111 {beats}/min

## 2017-01-05 ENCOUNTER — Ambulatory Visit (HOSPITAL_COMMUNITY)
Admission: RE | Admit: 2017-01-05 | Discharge: 2017-01-05 | Disposition: A | Discharge status: Home / Self Care | Payer: BLUE CROSS/BLUE SHIELD | Source: Ambulatory Visit | Attending: Gastroenterology | Admitting: Gastroenterology

## 2017-01-05 ENCOUNTER — Ambulatory Visit (HOSPITAL_COMMUNITY): Payer: BLUE CROSS/BLUE SHIELD | Admitting: Anesthesiology

## 2017-01-05 ENCOUNTER — Telehealth: Payer: Self-pay | Admitting: *Deleted

## 2017-01-05 ENCOUNTER — Ambulatory Visit (HOSPITAL_COMMUNITY)
Admission: RE | Admit: 2017-01-05 | Payer: BLUE CROSS/BLUE SHIELD | Source: Ambulatory Visit | Admitting: Gastroenterology

## 2017-01-05 ENCOUNTER — Encounter (HOSPITAL_COMMUNITY): Payer: Self-pay | Admitting: *Deleted

## 2017-01-05 ENCOUNTER — Encounter (HOSPITAL_COMMUNITY)
Admission: RE | Disposition: A | Discharge status: Home / Self Care | Payer: Self-pay | Source: Ambulatory Visit | Attending: Gastroenterology

## 2017-01-05 DIAGNOSIS — K5730 Diverticulosis of large intestine without perforation or abscess without bleeding: Secondary | ICD-10-CM | POA: Diagnosis not present

## 2017-01-05 DIAGNOSIS — Z7982 Long term (current) use of aspirin: Secondary | ICD-10-CM | POA: Insufficient documentation

## 2017-01-05 DIAGNOSIS — K648 Other hemorrhoids: Secondary | ICD-10-CM | POA: Insufficient documentation

## 2017-01-05 DIAGNOSIS — F419 Anxiety disorder, unspecified: Secondary | ICD-10-CM | POA: Diagnosis not present

## 2017-01-05 DIAGNOSIS — Z79899 Other long term (current) drug therapy: Secondary | ICD-10-CM | POA: Insufficient documentation

## 2017-01-05 DIAGNOSIS — F1021 Alcohol dependence, in remission: Secondary | ICD-10-CM | POA: Insufficient documentation

## 2017-01-05 DIAGNOSIS — K2980 Duodenitis without bleeding: Secondary | ICD-10-CM | POA: Insufficient documentation

## 2017-01-05 DIAGNOSIS — Z8673 Personal history of transient ischemic attack (TIA), and cerebral infarction without residual deficits: Secondary | ICD-10-CM | POA: Diagnosis not present

## 2017-01-05 DIAGNOSIS — F17210 Nicotine dependence, cigarettes, uncomplicated: Secondary | ICD-10-CM | POA: Diagnosis not present

## 2017-01-05 DIAGNOSIS — K2970 Gastritis, unspecified, without bleeding: Secondary | ICD-10-CM | POA: Diagnosis not present

## 2017-01-05 DIAGNOSIS — K219 Gastro-esophageal reflux disease without esophagitis: Secondary | ICD-10-CM | POA: Diagnosis not present

## 2017-01-05 DIAGNOSIS — Q438 Other specified congenital malformations of intestine: Secondary | ICD-10-CM | POA: Diagnosis not present

## 2017-01-05 DIAGNOSIS — K644 Residual hemorrhoidal skin tags: Secondary | ICD-10-CM | POA: Insufficient documentation

## 2017-01-05 DIAGNOSIS — I10 Essential (primary) hypertension: Secondary | ICD-10-CM | POA: Diagnosis not present

## 2017-01-05 DIAGNOSIS — D649 Anemia, unspecified: Secondary | ICD-10-CM

## 2017-01-05 DIAGNOSIS — D1721 Benign lipomatous neoplasm of skin and subcutaneous tissue of right arm: Secondary | ICD-10-CM | POA: Diagnosis present

## 2017-01-05 DIAGNOSIS — K2950 Unspecified chronic gastritis without bleeding: Secondary | ICD-10-CM | POA: Insufficient documentation

## 2017-01-05 HISTORY — PX: ESOPHAGOGASTRODUODENOSCOPY (EGD) WITH PROPOFOL: SHX5813

## 2017-01-05 HISTORY — PX: COLONOSCOPY WITH PROPOFOL: SHX5780

## 2017-01-05 HISTORY — PX: BIOPSY: SHX5522

## 2017-01-05 LAB — RAPID URINE DRUG SCREEN, HOSP PERFORMED
Amphetamines: NOT DETECTED
BARBITURATES: NOT DETECTED
BENZODIAZEPINES: NOT DETECTED
COCAINE: NOT DETECTED
Opiates: NOT DETECTED
Tetrahydrocannabinol: NOT DETECTED

## 2017-01-05 LAB — FERRITIN: Ferritin: 385 ng/mL — ABNORMAL HIGH (ref 24–336)

## 2017-01-05 LAB — VITAMIN B12: Vitamin B-12: 298 pg/mL (ref 180–914)

## 2017-01-05 SURGERY — COLONOSCOPY WITH PROPOFOL
Anesthesia: Monitor Anesthesia Care

## 2017-01-05 MED ORDER — PROPOFOL 10 MG/ML IV BOLUS
INTRAVENOUS | Status: AC
Start: 2017-01-05 — End: 2017-01-05
  Filled 2017-01-05: qty 20

## 2017-01-05 MED ORDER — LACTATED RINGERS IV SOLN
INTRAVENOUS | Status: DC
  Administered 2017-01-05: 09:00:00 via INTRAVENOUS

## 2017-01-05 MED ORDER — MIDAZOLAM HCL 2 MG/2ML IJ SOLN
INTRAMUSCULAR | Status: AC
  Filled 2017-01-05: qty 2

## 2017-01-05 MED ORDER — MIDAZOLAM HCL 5 MG/5ML IJ SOLN
INTRAMUSCULAR | Status: DC | PRN
  Administered 2017-01-05: 2 mg via INTRAVENOUS

## 2017-01-05 MED ORDER — PROPOFOL 500 MG/50ML IV EMUL
INTRAVENOUS | Status: DC | PRN
  Administered 2017-01-05: 75 ug/kg/min via INTRAVENOUS
  Administered 2017-01-05: 10:00:00 via INTRAVENOUS

## 2017-01-05 MED ORDER — MIDAZOLAM HCL 2 MG/2ML IJ SOLN
1.0000 mg | INTRAMUSCULAR | Status: AC
  Administered 2017-01-05: 2 mg via INTRAVENOUS

## 2017-01-05 MED ORDER — LIDOCAINE VISCOUS 2 % MT SOLN
OROMUCOSAL | Status: AC
Start: 2017-01-05 — End: 2017-01-05
  Filled 2017-01-05: qty 15

## 2017-01-05 MED ORDER — FENTANYL CITRATE (PF) 100 MCG/2ML IJ SOLN
25.0000 ug | Freq: Once | INTRAMUSCULAR | Status: AC
  Administered 2017-01-05: 25 ug via INTRAVENOUS

## 2017-01-05 MED ORDER — FENTANYL CITRATE (PF) 100 MCG/2ML IJ SOLN
INTRAMUSCULAR | Status: AC
  Filled 2017-01-05: qty 2

## 2017-01-05 MED ORDER — LIDOCAINE VISCOUS 2 % MT SOLN
5.0000 mL | Freq: Once | OROMUCOSAL | Status: AC
  Administered 2017-01-05: 5 mL via OROMUCOSAL

## 2017-01-05 NOTE — Op Note (Signed)
[facility] Patient Name: [name] Procedure Date: 01/05/2017 9:42 AM MRN: [id number] Date of Birth: 1970/08/22 Attending MD: Barney Drain MD, MD CSN: [id number] Age: 46 Admit Type: Outpatient Procedure:                Colonoscopy, DIAGNOSTIC Indications:              Anemia-Hb 12.7 MCV 86.1 Providers:                Barney Drain MD, MD, Lurline Del, RN, Selena Lesser, Aram Candela Referring MD:             Ralene Bathe. Dondiego, MD Medicines:                Propofol per Anesthesia Complications:            No immediate complications. Estimated Blood Loss:     Estimated blood loss: none. Procedure:                Pre-Anesthesia Assessment:                           - Prior to the procedure, a History and Physical                            was performed, and patient medications and                            allergies were reviewed. The patient's tolerance of                            previous anesthesia was also reviewed. The risks                            and benefits of the procedure and the sedation                            options and risks were discussed with the patient.                            All questions were answered, and informed consent                            was obtained. Prior Anticoagulants: The patient has                            taken aspirin, last dose was day of procedure. ASA                            Grade Assessment: II - A patient with mild systemic                            disease. After reviewing the risks and benefits,  the patient was deemed in satisfactory condition to                            undergo the procedure. After obtaining informed                            consent, the colonoscope was passed under direct                            vision. Throughout the procedure, the patient's                            blood pressure, pulse, and oxygen saturations were                    monitored continuously. The EC-[id number] ([id number])                            scope was introduced through the anus and advanced                            to the the cecum, identified by appendiceal orifice                            and ileocecal valve. The ileocecal valve,                            appendiceal orifice, and rectum were photographed.                            The colonoscopy was somewhat difficult due to a                            tortuous colon. Successful completion of the                            procedure was aided by COLOWRAP. The patient                            tolerated the procedure fairly well. The quality of                            the bowel preparation was excellent. Scope In: 10:17:10 AM Scope Out: 10:32:04 AM Scope Withdrawal Time: 0 hours 12 minutes 22 seconds  Total Procedure Duration: 0 hours 14 minutes 54 seconds  Findings:      A few small-mouthed diverticula were found in the transverse colon,       ascending colon and cecum.      The recto-sigmoid colon and sigmoid colon were moderately redundant.      External and internal hemorrhoids were found during retroflexion. The       hemorrhoids were moderate. Impression:               - MILD Diverticulosis in the transverse colon, in  the ascending colon and in the cecum.                           - Redundant LEFT colon.                           - External and internal hemorrhoids.                           - NO SOURCE FOR ANEMIA IDENTIFIED Moderate Sedation:      Per Anesthesia Care Recommendation:           - Repeat colonoscopy in 10 years for surveillance.                           - High fiber diet.                           - Continue present medications.                           - Return to my office in 3 months.                           - Patient has a contact number available for                            emergencies. The signs and symptoms  of potential                            delayed complications were discussed with the                            patient. Return to normal activities tomorrow.                            Written discharge instructions were provided to the                            patient. Procedure Code(s):        --- Professional ---                           [phone], Colonoscopy, flexible; diagnostic, including                            collection of specimen(s) by brushing or washing,                            when performed (separate procedure) Diagnosis Code(s):        --- Professional ---                           A21.3, Other hemorrhoids                           D64.9, Anemia, unspecified  K57.30, Diverticulosis of large intestine without                            perforation or abscess without bleeding                           Q43.8, Other specified congenital malformations of                            intestine CPT copyright 2016 [employer]. All rights reserved. The codes documented in this report are preliminary and upon coder review may  be revised to meet current compliance requirements. Barney Drain, MD Barney Drain MD, MD 01/05/2017 10:50:48 AM This report has been signed electronically. Number of Addenda: 0

## 2017-01-05 NOTE — Discharge Instructions (Signed)
You have gastritis and duodenitis which is the most LIKELY CAUSE FOR YOUR LOW BLOOD COUNT.  You have internal hemorrhoids.  I BIOPSIED YOUR STOMACH, AND SMALL BOWEL.     DRINK WATER TO KEEP YOUR URINE LIGHT YELLOW.  FOLLOW A HIGH FIBER/LOW FAT DIET. AVOID ITEMS THAT CAUSE BLOATING. SEE INFO BELOW.  CONTINUE PROTONIX. TAKE 30 MINUTES PRIOR TO BREAKFAST.  YOUR BIOPSY RESULTS WILL BE AVAILABLE IN [facility] CHART AFTER  Oct 12 AND MY OFFICE WILL CONTACT YOU IN 10-14 DAYS WITH YOUR RESULTS.   FOLLOW UP IN 3 MOS.   Next colonoscopy in 10 years.   ENDOSCOPY Care After Read the instructions outlined below and refer to this sheet in the next week. These discharge instructions provide you with general information on caring for yourself after you leave the hospital. While your treatment has been planned according to the most current medical practices available, unavoidable complications occasionally occur. If you have any problems or questions after discharge, call DR. Velvet Moomaw, [phone].  ACTIVITY  You may resume your regular activity, but move at a slower pace for the next 24 hours.   Take frequent rest periods for the next 24 hours.   Walking will help get rid of the air and reduce the bloated feeling in your belly (abdomen).   No driving for 24 hours (because of the medicine (anesthesia) used during the test).   You may shower.   Do not sign any important legal documents or operate any machinery for 24 hours (because of the anesthesia used during the test).    NUTRITION  Drink plenty of fluids.   You may resume your normal diet as instructed by your doctor.   Begin with a light meal and progress to your normal diet. Heavy or fried foods are harder to digest and may make you feel sick to your stomach (nauseated).   Avoid alcoholic beverages for 24 hours or as instructed.    MEDICATIONS  You may resume your normal medications.   WHAT YOU CAN EXPECT TODAY  Some feelings of  bloating in the abdomen.   Passage of more gas than usual.   Spotting of blood in your stool or on the toilet paper  .  IF YOU HAD POLYPS REMOVED DURING THE ENDOSCOPY:  Eat a soft diet IF YOU HAVE NAUSEA, BLOATING, ABDOMINAL PAIN, OR VOMITING.    FINDING OUT THE RESULTS OF YOUR TEST Not all test results are available during your visit. DR. Oneida Alar WILL CALL YOU WITHIN 14 DAYS OF YOUR PROCEDUE WITH YOUR RESULTS. Do not assume everything is normal if you have not heard from DR. Ziyon Soltau, CALL HER OFFICE AT [phone].  SEEK IMMEDIATE MEDICAL ATTENTION AND CALL THE OFFICE: [phone] IF:  You have more than a spotting of blood in your stool.   Your belly is swollen (abdominal distention).   You are nauseated or vomiting.   You have a temperature over 101F.   You have abdominal pain or discomfort that is severe or gets worse throughout the day.   Gastritis/DUODENITIS  Gastritis is an inflammation (the body's way of reacting to injury and/or infection) of the stomach. DUODENITIS is an inflammation (the body's way of reacting to injury and/or infection) of the FIRST PART OF THE SMALL INTESTINES. It is often caused by bacterial (germ) infections. It can also be caused BY ASPIRIN, BC/GOODY POWDER'S, (IBUPROFEN) MOTRIN, OR ALEVE (NAPROXEN), chemicals (including alcohol), SPICY FOODS, and medications. This illness may be associated with generalized malaise (feeling tired, not  well), UPPER ABDOMINAL STOMACH cramps, and fever. One common bacterial cause of gastritis is an organism known as H. Pylori. This can be treated with antibiotics.     High-Fiber Diet A high-fiber diet changes your normal diet to include more whole grains, legumes, fruits, and vegetables. Changes in the diet involve replacing refined carbohydrates with unrefined foods. The calorie level of the diet is essentially unchanged. The Dietary Reference Intake (recommended amount) for adult males is 38 grams per day. For adult  females, it is 25 grams per day. Pregnant and lactating women should consume 28 grams of fiber per day. Fiber is the intact part of a plant that is not broken down during digestion. Functional fiber is fiber that has been isolated from the plant to provide a beneficial effect in the body. PURPOSE  Increase stool bulk.   Ease and regulate bowel movements.   Lower cholesterol.   REDUCE RISK OF COLON CANCER  INDICATIONS THAT YOU NEED MORE FIBER  Constipation and hemorrhoids.   Uncomplicated diverticulosis (intestine condition) and irritable bowel syndrome.   Weight management.   As a protective measure against hardening of the arteries (atherosclerosis), diabetes, and cancer.   GUIDELINES FOR INCREASING FIBER IN THE DIET  Start adding fiber to the diet slowly. A gradual increase of about 5 more grams (2 slices of whole-wheat bread, 2 servings of most fruits or vegetables, or 1 bowl of high-fiber cereal) per day is best. Too rapid an increase in fiber may result in constipation, flatulence, and bloating.   Drink enough water and fluids to keep your urine clear or pale yellow. Water, juice, or caffeine-free drinks are recommended. Not drinking enough fluid may cause constipation.   Eat a variety of high-fiber foods rather than one type of fiber.   Try to increase your intake of fiber through using high-fiber foods rather than fiber pills or supplements that contain small amounts of fiber.   The goal is to change the types of food eaten. Do not supplement your present diet with high-fiber foods, but replace foods in your present diet.   INCLUDE A VARIETY OF FIBER SOURCES  Replace refined and processed grains with whole grains, canned fruits with fresh fruits, and incorporate other fiber sources. White rice, white breads, and most bakery goods contain little or no fiber.   Brown whole-grain rice, buckwheat oats, and many fruits and vegetables are all good sources of fiber. These  include: broccoli, Brussels sprouts, cabbage, cauliflower, beets, sweet potatoes, white potatoes (skin on), carrots, tomatoes, eggplant, squash, berries, fresh fruits, and dried fruits.   Cereals appear to be the richest source of fiber. Cereal fiber is found in whole grains and bran. Bran is the fiber-rich outer coat of cereal grain, which is largely removed in refining. In whole-grain cereals, the bran remains. In breakfast cereals, the largest amount of fiber is found in those with "bran" in their names. The fiber content is sometimes indicated on the label.   You may need to include additional fruits and vegetables each day.   In baking, for 1 cup white flour, you may use the following substitutions:   1 cup whole-wheat flour minus 2 tablespoons.   1/2 cup white flour plus 1/2 cup whole-wheat flour.   Low-Fat Diet BREADS, CEREALS, PASTA, RICE, DRIED PEAS, AND BEANS These products are high in carbohydrates and most are low in fat. Therefore, they can be increased in the diet as substitutes for fatty foods. They too, however, contain calories and should not  be eaten in excess. Cereals can be eaten for snacks as well as for breakfast.  Include foods that contain fiber (fruits, vegetables, whole grains, and legumes). Research shows that fiber may lower blood cholesterol levels, especially the water-soluble fiber found in fruits, vegetables, oat products, and legumes. FRUITS AND VEGETABLES It is good to eat fruits and vegetables. Besides being sources of fiber, both are rich in vitamins and some minerals. They help you get the daily allowances of these nutrients. Fruits and vegetables can be used for snacks and desserts. MEATS Limit lean meat, chicken, [place], and fish to no more than 6 ounces per day. Beef, Pork, and Lamb Use lean cuts of beef, pork, and lamb. Lean cuts include:  Extra-lean ground beef.  Arm roast.  Sirloin tip.  Center-cut ham.  Round steak.  Loin chops.  Rump roast.    Tenderloin.  Trim all fat off the outside of meats before cooking. It is not necessary to severely decrease the intake of red meat, but lean choices should be made. Lean meat is rich in protein and contains a highly absorbable form of iron. Premenopausal women, in particular, should avoid reducing lean red meat because this could increase the risk for low red blood cells (iron-deficiency anemia). The organ meats, such as liver, sweetbreads, kidneys, and brain are very rich in cholesterol. They should be limited. Chicken and [place] These are good sources of protein. The fat of poultry can be reduced by removing the skin and underlying fat layers before cooking. Chicken and [place] can be substituted for lean red meat in the diet. Poultry should not be fried or covered with high-fat sauces. Fish and Shellfish Fish is a good source of protein. Shellfish contain cholesterol, but they usually are low in saturated fatty acids. The preparation of fish is important. Like chicken and [place], they should not be fried or covered with high-fat sauces. EGGS Egg whites contain no fat or cholesterol. They can be eaten often. Try 1 to 2 egg whites instead of whole eggs in recipes or use egg substitutes that do not contain yolk. MILK AND DAIRY PRODUCTS Use skim or 1% milk instead of 2% or whole milk. Decrease whole milk, natural, and processed cheeses. Use nonfat or low-fat (2%) cottage cheese or low-fat cheeses made from vegetable oils. Choose nonfat or low-fat (1 to 2%) yogurt. Experiment with evaporated skim milk in recipes that call for heavy cream. Substitute low-fat yogurt or low-fat cottage cheese for sour cream in dips and salad dressings. Have at least 2 servings of low-fat dairy products, such as 2 glasses of skim (or 1%) milk each day to help get your daily calcium intake.  FATS AND OILS Reduce the total intake of fats, especially saturated fat. Butterfat, lard, and beef fats are high in saturated fat and  cholesterol. These should be avoided as much as possible. Vegetable fats do not contain cholesterol, but certain vegetable fats, such as coconut oil, palm oil, and palm kernel oil are very high in saturated fats. These should be limited. These fats are often used in bakery goods, processed foods, popcorn, oils, and nondairy creamers. Vegetable shortenings and some peanut butters contain hydrogenated oils, which are also saturated fats. Read the labels on these foods and check for saturated vegetable oils. Unsaturated vegetable oils and fats do not raise blood cholesterol. However, they should be limited because they are fats and are high in calories. Total fat should still be limited to 30% of your daily caloric intake. Desirable  liquid vegetable oils are corn oil, cottonseed oil, olive oil, canola oil, safflower oil, soybean oil, and sunflower oil. Peanut oil is not as good, but small amounts are acceptable. Buy a heart-healthy tub margarine that has no partially hydrogenated oils in the ingredients. Mayonnaise and salad dressings often are made from unsaturated fats, but they should also be limited because of their high calorie and fat content. Seeds, nuts, peanut butter, olives, and avocados are high in fat, but the fat is mainly the unsaturated type. These foods should be limited mainly to avoid excess calories and fat. OTHER EATING TIPS Snacks  Most sweets should be limited as snacks. They tend to be rich in calories and fats, and their caloric content outweighs their nutritional value. Some good choices in snacks are graham crackers, melba toast, soda crackers, bagels (no egg), English muffins, fruits, and vegetables. These snacks are preferable to snack crackers, [place] fries, and chips. Popcorn should be air-popped or cooked in small amounts of liquid vegetable oil. Desserts Eat fruit, low-fat yogurt, and fruit ices. AVOID pastries, cake, and cookies. Sherbet, angel food cake, gelatin dessert, frozen  low-fat yogurt, or other frozen products that do not contain saturated fat (pure fruit juice bars, frozen ice pops) are also acceptable.  COOKING METHODS Choose those methods that use little or no fat. They include: Poaching.  Braising.  Steaming.  Grilling.  Baking.  Stir-frying.  Broiling.  Microwaving.  Foods can be cooked in a nonstick pan without added fat, or use a nonfat cooking spray in regular cookware. Limit fried foods and avoid frying in saturated fat. Add moisture to lean meats by using water, broth, cooking wines, and other nonfat or low-fat sauces along with the cooking methods mentioned above. Soups and stews should be chilled after cooking. The fat that forms on top after a few hours in the refrigerator should be skimmed off. When preparing meals, avoid using excess salt. Salt can contribute to raising blood pressure in some people. EATING AWAY FROM HOME Order entres, potatoes, and vegetables without sauces or butter. When meat exceeds the size of a deck of cards (3 to 4 ounces), the rest can be taken home for another meal. Choose vegetable or fruit salads and ask for low-calorie salad dressings to be served on the side. Use dressings sparingly. Limit high-fat toppings, such as bacon, crumbled eggs, cheese, sunflower seeds, and olives. Ask for heart-healthy tub margarine instead of butter.

## 2017-01-05 NOTE — Anesthesia Postprocedure Evaluation (Signed)
Anesthesia Post Note  Patient: [name]  Procedure(s) Performed: COLONOSCOPY WITH PROPOFOL (N/A ) ESOPHAGOGASTRODUODENOSCOPY (EGD) WITH PROPOFOL (N/A ) BIOPSY  Patient location during evaluation: PACU Anesthesia Type: MAC Level of consciousness: sedated Pain management: pain level controlled Vital Signs Assessment: post-procedure vital signs reviewed and stable Respiratory status: spontaneous breathing, nonlabored ventilation and respiratory function stable Cardiovascular status: blood pressure returned to baseline Postop Assessment: no apparent nausea or vomiting Anesthetic complications: no     Last Vitals:  Vitals:   01/05/17 0901  BP: 129/87  Pulse: 66  Temp: 36.5 C  SpO2: 95%    Last Pain:  Vitals:   01/05/17 0901  TempSrc: Oral                 Noeli Lavery J

## 2017-01-05 NOTE — Telephone Encounter (Signed)
Notes recorded by Laurine Blazer, LPN on 90/05/4095 at 3:53 AM EDT Patient notified via VM. Copy to pmd. Follow up already scheduled for 01/13/2017. ------  Notes recorded by Herminio Commons, MD on 01/04/2017 at 1:30 PM EDT Normal.

## 2017-01-05 NOTE — H&P (View-Only) (Signed)
   Subjective:    Patient ID: [name], male    DOB: 11/10/1970, 46 y.o.   MRN: [id number]  Lucia Gaskins, MD   HPI MAY FEEL SOB IF HE WALKS. GAINING WEIGHT ON SEROQUEL. HAS HEART APPT NEXT WEEK. HAD HEARTBURN BUT RESOLVED WITH PROTONIX. HARD TO SEDATE-WOKE UP DURING APPENDECTOMY AND ANEURYSM COILING. LAST ETOH/COCAINE AUG 2018. HAS HISTORY OF ELEVATED LIVER ENZYMES.  PT DENIES FEVER, CHILLS, HEMATOCHEZIA, , nausea, vomiting, melena, diarrhea, CHEST PAIN, CHANGE IN BOWEL IN HABITS, constipation, abdominal pain, problems swallowing, OR  heartburn or indigestion.  Past Medical History:  Diagnosis Date  . EtOH dependence ([place])    QUIT AUG 8.  Marland Kitchen GERD without esophagitis   . Hepatitis C carrier ([place])   . Polysubstance dependence ([place])    COCAINE-LAST DOSE AUG 8   Past Surgical History:  Procedure Laterality Date  . ANEURYSM COILING     AGE 78 x2  . LAPAROSCOPIC APPENDECTOMY     No Known Allergies  Current Outpatient Prescriptions  Medication Sig Dispense Refill  . lisinopril (PRINIVIL,ZESTRIL) 10 MG tablet Take 1 tablet (10 mg total) by mouth daily. For high blood pressure 30 tablet 0  . mirtazapine (REMERON) 7.5 MG tablet Take 1 tablet (7.5 mg total) by mouth at bedtime. For sleep 30 tablet 0  . pantoprazole (PROTONIX) 40 MG tablet Take 1 tablet (40 mg total) by mouth daily. For acid reflux 30 tablet 0  . QUEtiapine (SEROQUEL) 100 MG tablet Take 100 mg by mouth at bedtime.           Review of Systems     Objective:   Physical Exam  Constitutional: He is oriented to person, place, and time. He appears well-developed and well-nourished. No distress.  HENT:  Head: Normocephalic and atraumatic.  Mouth/Throat: Oropharynx is clear and moist. No oropharyngeal exudate.  Eyes: Pupils are equal, round, and reactive to light. No scleral icterus.  Neck: Normal range of motion. Neck supple.  Cardiovascular: Normal rate, regular rhythm and normal heart sounds.     Pulmonary/Chest: Effort normal and breath sounds normal. No respiratory distress.  Abdominal: Soft. Bowel sounds are normal. He exhibits no distension. There is no tenderness.  Musculoskeletal: He exhibits no edema.  Lymphadenopathy:    He has no cervical adenopathy.  Neurological: He is alert and oriented to person, place, and time.  NO FOCAL DEFICITS  Psychiatric:  SLIGHTLY ANXIOUS MOOD, NL AFFECT  Vitals reviewed.         Assessment & Plan:

## 2017-01-05 NOTE — Anesthesia Preprocedure Evaluation (Signed)
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  Reproductive/Obstetrics                             Anesthesia Physical Anesthesia Plan  ASA: III  Anesthesia Plan: MAC   Post-op Pain Management:    Induction: Intravenous  PONV Risk Score and Plan:   Airway Management Planned: Simple Face Mask  Additional Equipment:   Intra-op Plan:   Post-operative Plan:   Informed Consent: I have reviewed the patients History and Physical, chart, labs and discussed the procedure including the risks, benefits and alternatives for the proposed anesthesia with the patient or authorized representative who has indicated his/her understanding and acceptance.     Plan Discussed with:   Anesthesia Plan Comments:         Anesthesia Quick Evaluation

## 2017-01-05 NOTE — Interval H&P Note (Signed)
History and Physical Interval Note:  01/05/2017 9:54 AM  [name]  has presented today for surgery, with the diagnosis of anemia  The various methods of treatment have been discussed with the patient and family. After consideration of risks, benefits and other options for treatment, the patient has consented to  Procedure(s) with comments: COLONOSCOPY WITH PROPOFOL (N/A) - 10:00am ESOPHAGOGASTRODUODENOSCOPY (EGD) WITH PROPOFOL (N/A) as a surgical intervention .  The patient's history has been reviewed, patient examined, no change in status, stable for surgery.  I have reviewed the patient's chart and labs.  Questions were answered to the patient's satisfaction.     [employer]

## 2017-01-05 NOTE — Op Note (Addendum)
[facility] Patient Name: [name] Procedure Date: 01/05/2017 9:49 AM MRN: [id number] Date of Birth: 1970-09-11 Attending MD: Barney Drain MD, MD CSN: [id number] Age: 46 Admit Type: Outpatient Procedure:                Upper GI endoscopy WITH COLD FORCEPS BIOPSY Indications:              Anemia-USES ASA AND NAPROXEN. PROTONIX DAILY. Providers:                Barney Drain MD, MD, Selena Lesser, Lurline Del,                            RN, Aram Candela Referring MD:             Ralene Bathe. Dondiego, MD Medicines:                Propofol per Anesthesia Complications:            No immediate complications. Estimated Blood Loss:     Estimated blood loss was minimal. Procedure:                Pre-Anesthesia Assessment:                           - Prior to the procedure, a History and Physical                            was performed, and patient medications and                            allergies were reviewed. The patient's tolerance of                            previous anesthesia was also reviewed. The risks                            and benefits of the procedure and the sedation                            options and risks were discussed with the patient.                            All questions were answered, and informed consent                            was obtained. Prior Anticoagulants: The patient has                            taken aspirin, last dose was day of procedure. ASA                            Grade Assessment: II - A patient with mild systemic                            disease. After reviewing the risks and benefits,  the patient was deemed in satisfactory condition to                            undergo the procedure. After obtaining informed                            consent, the endoscope was passed under direct                            vision. Throughout the procedure, the patient's                            blood pressure,  pulse, and oxygen saturations were                            monitored continuously. The [id number] ([id number])                            scope was introduced through the mouth, and                            advanced to the second part of duodenum. The upper                            GI endoscopy was accomplished without difficulty.                            The patient tolerated the procedure well. Scope In: 10:37:44 AM Scope Out: 10:46:02 AM Total Procedure Duration: 0 hours 8 minutes 18 seconds  Findings:      The examined esophagus was normal.      Diffuse moderate inflammation characterized by congestion (edema),       erosions and erythema was found in the entire examined stomach. Biopsies       were taken with a cold forceps for Helicobacter pylori testing.      Patchy mild inflammation characterized by congestion (edema) and       erythema was found in the duodenal bulb. Biopsies for histology were       taken with a cold forceps for evaluation of celiac disease. Impression:               - Normal esophagus.                           - NORMOCYTIC ANEMIA MOST LIKELY DUE TO MODERATE                            Gastritis AND MILD Duodenitis. Moderate Sedation:      Per Anesthesia Care Recommendation:           - Await pathology results.                           - High fiber diet. CHECK B12, RBC FOLATE, AND  FERRITIN TODAY.                           - Continue present medications.                           - Return to my office in 3 months.                           - Patient has a contact number available for                            emergencies. The signs and symptoms of potential                            delayed complications were discussed with the                            patient. Return to normal activities tomorrow.                            Written discharge instructions were provided to the                            patient. Procedure  Code(s):        --- Professional ---                           [phone], Esophagogastroduodenoscopy, flexible,                            transoral; with biopsy, single or multiple Diagnosis Code(s):        --- Professional ---                           K29.70, Gastritis, unspecified, without bleeding                           K29.80, Duodenitis without bleeding                           D64.9, Anemia, unspecified CPT copyright 2016 American Medical Association. All rights reserved. The codes documented in this report are preliminary and upon coder review may  be revised to meet current compliance requirements. Barney Drain, MD Barney Drain MD, MD 01/05/2017 10:58:35 AM This report has been signed electronically. Number of Addenda: 0

## 2017-01-05 NOTE — Transfer of Care (Signed)
Immediate Anesthesia Transfer of Care Note  Patient: [name]  Procedure(s) Performed: COLONOSCOPY WITH PROPOFOL (N/A ) ESOPHAGOGASTRODUODENOSCOPY (EGD) WITH PROPOFOL (N/A ) BIOPSY  Patient Location: PACU  Anesthesia Type:MAC  Level of Consciousness: drowsy  Airway & Oxygen Therapy: Patient Spontanous Breathing and Patient connected to face mask oxygen  Post-op Assessment: Report given to RN and Post -op Vital signs reviewed and stable  Post vital signs: Reviewed and stable  Last Vitals:  Vitals:   01/05/17 0901  BP: 129/87  Pulse: 66  Temp: 36.5 C  SpO2: 95%    Last Pain:  Vitals:   01/05/17 0901  TempSrc: Oral      Patients Stated Pain Goal: 6 (67/54/49 2010)  Complications: No apparent anesthesia complications

## 2017-01-06 LAB — FOLATE RBC
FOLATE, HEMOLYSATE: 588.7 ng/mL
FOLATE, RBC: 1529 ng/mL (ref >498)
HEMATOCRIT: 38.5 % (ref 37.5–51.0)

## 2017-01-07 ENCOUNTER — Encounter (HOSPITAL_COMMUNITY): Payer: Self-pay | Admitting: Gastroenterology

## 2017-01-13 ENCOUNTER — Ambulatory Visit: Payer: Self-pay | Admitting: Cardiovascular Disease

## 2017-01-13 ENCOUNTER — Ambulatory Visit (HOSPITAL_COMMUNITY): Payer: BLUE CROSS/BLUE SHIELD | Admitting: Anesthesiology

## 2017-01-13 ENCOUNTER — Encounter (HOSPITAL_COMMUNITY): Payer: Self-pay | Admitting: *Deleted

## 2017-01-13 ENCOUNTER — Ambulatory Visit (HOSPITAL_COMMUNITY)
Admission: RE | Admit: 2017-01-13 | Discharge: 2017-01-13 | Disposition: A | Discharge status: Home / Self Care | Payer: BLUE CROSS/BLUE SHIELD | Source: Ambulatory Visit | Attending: General Surgery | Admitting: General Surgery

## 2017-01-13 ENCOUNTER — Encounter (HOSPITAL_COMMUNITY)
Admission: RE | Disposition: A | Discharge status: Home / Self Care | Payer: Self-pay | Source: Ambulatory Visit | Attending: General Surgery

## 2017-01-13 DIAGNOSIS — F1420 Cocaine dependence, uncomplicated: Secondary | ICD-10-CM | POA: Diagnosis not present

## 2017-01-13 DIAGNOSIS — K219 Gastro-esophageal reflux disease without esophagitis: Secondary | ICD-10-CM | POA: Diagnosis not present

## 2017-01-13 DIAGNOSIS — I69311 Memory deficit following cerebral infarction: Secondary | ICD-10-CM | POA: Diagnosis not present

## 2017-01-13 DIAGNOSIS — F419 Anxiety disorder, unspecified: Secondary | ICD-10-CM | POA: Diagnosis not present

## 2017-01-13 DIAGNOSIS — Z7982 Long term (current) use of aspirin: Secondary | ICD-10-CM | POA: Diagnosis not present

## 2017-01-13 DIAGNOSIS — Z79899 Other long term (current) drug therapy: Secondary | ICD-10-CM | POA: Insufficient documentation

## 2017-01-13 DIAGNOSIS — D1721 Benign lipomatous neoplasm of skin and subcutaneous tissue of right arm: Secondary | ICD-10-CM | POA: Diagnosis not present

## 2017-01-13 DIAGNOSIS — F17210 Nicotine dependence, cigarettes, uncomplicated: Secondary | ICD-10-CM | POA: Insufficient documentation

## 2017-01-13 DIAGNOSIS — B182 Chronic viral hepatitis C: Secondary | ICD-10-CM | POA: Diagnosis not present

## 2017-01-13 HISTORY — PX: MASS EXCISION: SHX2000

## 2017-01-13 LAB — RAPID URINE DRUG SCREEN, HOSP PERFORMED
AMPHETAMINES: NOT DETECTED
BARBITURATES: NOT DETECTED
Benzodiazepines: NOT DETECTED
Cocaine: NOT DETECTED
OPIATES: NOT DETECTED
TETRAHYDROCANNABINOL: NOT DETECTED

## 2017-01-13 SURGERY — EXCISION MASS
Anesthesia: General | Site: Arm Lower | Laterality: Right

## 2017-01-13 MED ORDER — BUPIVACAINE HCL (PF) 0.5 % IJ SOLN
INTRAMUSCULAR | Status: AC
  Filled 2017-01-13: qty 30

## 2017-01-13 MED ORDER — LIDOCAINE HCL (PF) 1 % IJ SOLN
INTRAMUSCULAR | Status: AC
  Filled 2017-01-13: qty 5

## 2017-01-13 MED ORDER — HEMOSTATIC AGENTS (NO CHARGE) OPTIME
TOPICAL | Status: DC | PRN
  Administered 2017-01-13: 1 via TOPICAL

## 2017-01-13 MED ORDER — OXYCODONE HCL 5 MG PO TABS: 5.0000 mg | ORAL_TABLET | ORAL | 0 refills | Status: DC | PRN

## 2017-01-13 MED ORDER — PROPOFOL 10 MG/ML IV BOLUS
INTRAVENOUS | Status: AC
  Filled 2017-01-13: qty 20

## 2017-01-13 MED ORDER — CHLORHEXIDINE GLUCONATE CLOTH 2 % EX PADS: 6.0000 | MEDICATED_PAD | Freq: Once | CUTANEOUS | Status: DC

## 2017-01-13 MED ORDER — MIDAZOLAM HCL 2 MG/2ML IJ SOLN
INTRAMUSCULAR | Status: AC
  Filled 2017-01-13: qty 2

## 2017-01-13 MED ORDER — MIDAZOLAM HCL 5 MG/5ML IJ SOLN
INTRAMUSCULAR | Status: DC | PRN
  Administered 2017-01-13: 2 mg via INTRAVENOUS

## 2017-01-13 MED ORDER — DOCUSATE SODIUM 100 MG PO CAPS: 100.0000 mg | ORAL_CAPSULE | Freq: Two times a day (BID) | ORAL | 2 refills | Status: DC

## 2017-01-13 MED ORDER — MIDAZOLAM HCL 2 MG/2ML IJ SOLN
1.0000 mg | INTRAMUSCULAR | Status: AC
  Administered 2017-01-13: 2 mg via INTRAVENOUS

## 2017-01-13 MED ORDER — CEFAZOLIN SODIUM-DEXTROSE 2-4 GM/100ML-% IV SOLN
2.0000 g | INTRAVENOUS | Status: AC
  Administered 2017-01-13: 2 g via INTRAVENOUS
  Filled 2017-01-13: qty 100

## 2017-01-13 MED ORDER — BUPIVACAINE HCL (PF) 0.5 % IJ SOLN
INTRAMUSCULAR | Status: DC | PRN
  Administered 2017-01-13: 7 mL

## 2017-01-13 MED ORDER — PROPOFOL 10 MG/ML IV BOLUS
INTRAVENOUS | Status: DC | PRN
  Administered 2017-01-13: 40 mg via INTRAVENOUS
  Administered 2017-01-13: 160 mg via INTRAVENOUS

## 2017-01-13 MED ORDER — FENTANYL CITRATE (PF) 100 MCG/2ML IJ SOLN: 25.0000 ug | INTRAMUSCULAR | Status: DC | PRN

## 2017-01-13 MED ORDER — POVIDONE-IODINE 10 % EX OINT
TOPICAL_OINTMENT | CUTANEOUS | Status: AC
  Filled 2017-01-13: qty 1

## 2017-01-13 MED ORDER — SUCCINYLCHOLINE CHLORIDE 20 MG/ML IJ SOLN: INTRAMUSCULAR | Status: DC | PRN

## 2017-01-13 MED ORDER — ONDANSETRON HCL 4 MG/2ML IJ SOLN
INTRAMUSCULAR | Status: AC
  Filled 2017-01-13: qty 2

## 2017-01-13 MED ORDER — 0.9 % SODIUM CHLORIDE (POUR BTL) OPTIME
TOPICAL | Status: DC | PRN
  Administered 2017-01-13: 1000 mL

## 2017-01-13 MED ORDER — ACETAMINOPHEN 500 MG PO TABS
1000.0000 mg | ORAL_TABLET | ORAL | Status: AC
  Administered 2017-01-13: 1000 mg via ORAL
  Filled 2017-01-13: qty 2

## 2017-01-13 MED ORDER — ONDANSETRON HCL 4 MG/2ML IJ SOLN
4.0000 mg | Freq: Once | INTRAMUSCULAR | Status: AC
  Administered 2017-01-13: 4 mg via INTRAVENOUS

## 2017-01-13 MED ORDER — FENTANYL CITRATE (PF) 100 MCG/2ML IJ SOLN
INTRAMUSCULAR | Status: DC | PRN
  Administered 2017-01-13 (×2): 50 ug via INTRAVENOUS

## 2017-01-13 MED ORDER — LIDOCAINE HCL 1 % IJ SOLN
INTRAMUSCULAR | Status: DC | PRN
  Administered 2017-01-13: 25 mg via INTRADERMAL

## 2017-01-13 MED ORDER — LACTATED RINGERS IV SOLN
INTRAVENOUS | Status: DC
  Administered 2017-01-13: 07:00:00 via INTRAVENOUS

## 2017-01-13 MED ORDER — FENTANYL CITRATE (PF) 100 MCG/2ML IJ SOLN
INTRAMUSCULAR | Status: AC
  Filled 2017-01-13: qty 4

## 2017-01-13 SURGICAL SUPPLY — 40 items
BAG HAMPER (MISCELLANEOUS) ×3 IMPLANT
BLADE SURG SZ11 CARB STEEL (BLADE) ×3 IMPLANT
CHLORAPREP W/TINT 10.5 ML (MISCELLANEOUS) ×3 IMPLANT
CLOTH BEACON ORANGE TIMEOUT ST (SAFETY) ×3 IMPLANT
COVER LIGHT HANDLE STERIS (MISCELLANEOUS) ×6 IMPLANT
DECANTER SPIKE VIAL GLASS SM (MISCELLANEOUS) ×3 IMPLANT
DERMABOND ADVANCED (GAUZE/BANDAGES/DRESSINGS) ×2
DERMABOND ADVANCED .7 DNX12 (GAUZE/BANDAGES/DRESSINGS) ×1 IMPLANT
DRAPE EENT ADH APERT 31X51 STR (DRAPES) ×3 IMPLANT
ELECT NEEDLE TIP 2.8 STRL (NEEDLE) ×3 IMPLANT
ELECT REM PT RETURN 9FT ADLT (ELECTROSURGICAL) ×3
ELECTRODE REM PT RTRN 9FT ADLT (ELECTROSURGICAL) ×1 IMPLANT
FORMALIN 10 PREFIL 120ML (MISCELLANEOUS) ×3 IMPLANT
GAUZE SPONGE 4X4 12PLY STRL (GAUZE/BANDAGES/DRESSINGS) ×6 IMPLANT
GLOVE BIO SURGEON STRL SZ 6.5 (GLOVE) ×2 IMPLANT
GLOVE BIO SURGEONS STRL SZ 6.5 (GLOVE) ×1
GLOVE BIOGEL PI IND STRL 6.5 (GLOVE) ×2 IMPLANT
GLOVE BIOGEL PI IND STRL 7.0 (GLOVE) ×1 IMPLANT
GLOVE BIOGEL PI INDICATOR 6.5 (GLOVE) ×4
GLOVE BIOGEL PI INDICATOR 7.0 (GLOVE) ×2
GOWN STRL REUS W/ TWL XL LVL3 (GOWN DISPOSABLE) ×1 IMPLANT
GOWN STRL REUS W/TWL LRG LVL3 (GOWN DISPOSABLE) ×3 IMPLANT
GOWN STRL REUS W/TWL XL LVL3 (GOWN DISPOSABLE) ×2
HEMOSTAT ARISTA ABSORB 1G (MISCELLANEOUS) ×3 IMPLANT
KIT ROOM TURNOVER APOR (KITS) ×3 IMPLANT
MANIFOLD NEPTUNE II (INSTRUMENTS) ×3 IMPLANT
NEEDLE 22X1 1/2 OR ONLY (MISCELLANEOUS)
NEEDLE 22X1.5 STRL (OR ONLY) (MISCELLANEOUS) IMPLANT
NEEDLE HYPO 25X1 1.5 SAFETY (NEEDLE) ×3 IMPLANT
NS IRRIG 1000ML POUR BTL (IV SOLUTION) ×3 IMPLANT
PACK MINOR (CUSTOM PROCEDURE TRAY) ×3 IMPLANT
PAD ARMBOARD 7.5X6 YLW CONV (MISCELLANEOUS) ×3 IMPLANT
SET BASIN LINEN APH (SET/KITS/TRAYS/PACK) ×3 IMPLANT
SUT ETHILON 3 0 FSL (SUTURE) IMPLANT
SUT MNCRL AB 4-0 PS2 18 (SUTURE) ×3 IMPLANT
SUT PROLENE 4 0 PS 2 18 (SUTURE) IMPLANT
SUT VIC AB 3-0 SH 27 (SUTURE) ×2
SUT VIC AB 3-0 SH 27X BRD (SUTURE) ×1 IMPLANT
SUT VIC AB 4-0 PS2 27 (SUTURE) IMPLANT
SYR CONTROL 10ML LL (SYRINGE) ×3 IMPLANT

## 2017-01-13 NOTE — Progress Notes (Signed)
  January 13, 2017  Patient: [name]  Date of Birth: Mar 16, 1971  Date of Visit: 01/05/2017    To Whom It May Concern:  Jayel Inks was seen and treated in our operating room on 01/05/2017. EMBER GOTTWALD  may return to work on Monday October 22nd, 2018.  Sincerely,   Per Dr. Blake Divine  Jacelyn Cuen Reynold Bowen, RN

## 2017-01-13 NOTE — Transfer of Care (Signed)
Immediate Anesthesia Transfer of Care Note  Patient: [name]  Procedure(s) Performed: EXCISION LIPOMA RIGHT ARM (Right Arm Lower)  Patient Location: PACU  Anesthesia Type:General  Level of Consciousness: awake and patient cooperative  Airway & Oxygen Therapy: Patient Spontanous Breathing and Patient connected to face mask oxygen  Post-op Assessment: Report given to RN, Post -op Vital signs reviewed and stable and Patient moving all extremities  Post vital signs: Reviewed and stable  Last Vitals:  Vitals:   01/13/17 0700 01/13/17 0715  BP: (!) 146/91 (!) 144/81  Pulse:    Resp: (!) 34 (!) 42  Temp:    SpO2: 98% 96%    Last Pain: There were no vitals filed for this visit.    Patients Stated Pain Goal: 6 (74/93/55 2174)  Complications: No apparent anesthesia complications

## 2017-01-13 NOTE — Interval H&P Note (Signed)
History and Physical Interval Note:  01/13/2017 7:23 AM  [name]  has presented today for surgery, with the diagnosis of lipoma right arm  The various methods of treatment have been discussed with the patient and family. After consideration of risks, benefits and other options for treatment, the patient has consented to  Procedure(s): EXCISION LIPOMA RIGHT ARM (Right) as a surgical intervention .  The patient's history has been reviewed, patient examined, no change in status, stable for surgery.  I have reviewed the patient's chart and labs.  Questions were answered to the patient's satisfaction.     Nuclear stress test normal 01/01/2017.   Virl Cagey

## 2017-01-13 NOTE — Anesthesia Postprocedure Evaluation (Signed)
Anesthesia Post Note  Patient: [name]  Procedure(s) Performed: EXCISION LIPOMA RIGHT ARM (Right Arm Lower)  Patient location during evaluation: PACU Anesthesia Type: General Level of consciousness: awake and alert Pain management: satisfactory to patient Vital Signs Assessment: post-procedure vital signs reviewed and stable Respiratory status: spontaneous breathing Cardiovascular status: stable Postop Assessment: no apparent nausea or vomiting Anesthetic complications: no     Last Vitals:  Vitals:   01/13/17 0915 01/13/17 0925  BP:  134/86  Pulse:  71  Resp:  18  Temp:  36.5 C  SpO2: 94% 94%    Last Pain:  Vitals:   01/13/17 1017  TempSrc:   PainSc: 5                  Shanique Aslinger

## 2017-01-13 NOTE — Op Note (Signed)
[facility] Operative Note  01/13/17  Preoperative Diagnosis: Right arm lipoma 4.5X2X4cm    Postoperative Diagnosis: Same   Procedure(s) Performed: Excision of soft tissue neoplasm of right dorsal arm (4.5X2X4cm)    Surgeon: Lanell Matar. Constance Haw, MD   Assistants: No qualified resident was available   Anesthesia: General anesthesia    Anesthesiologist: Lerry Liner, MD    Specimens: Lipoma    Estimated Blood Loss: Minimal   Blood Replacement: None    Complications: None   Wound Class: Clean    Operative Indications: [name] is a 46 yo with a right arm lipoma that has been there for several years and has been growing. An ultrasound was performed to verify that it was superficial to the muscles.  Given that it was causing him discomfort and was growing, he opted to proceed with removal.   Findings: Benign appearing lipoma, sent to pathology    Procedure: The patient was taken to the operating room and placed supine. General endotracheal anesthesia was induced. Intravenous antibiotics were administered per protocol.  The right arm was prepared and draped in the usual sterile fashion.   A longitudinal incision was made over the dorsum of the right arm.  This was carried down to the lipoma with a blade, and then a hemostat was utilized to encircle the lipoma.  Attachments were ligated with cautery.  The lipoma extended down to the fascia but not into the muscle. It was removed intact.  Sensicaine was placed in the wound bed.  Hemostasis was achieved with cautery and Arista.  The deep layer was closed with 3-0 Vicryl interrupted sutures, and the skin was closed with a subcutaneous running 4-0 Monocryl and dermabond.     All counts were correct at the end of the case. The patient was awakened from anesthesia and extubate without complication.  The patient went to the PACU in stable condition.   Curlene Labrum, MD [facility] [address] [place], [place] [address] [phone] (office)

## 2017-01-13 NOTE — Discharge Instructions (Signed)
Discharge Instructions: Shower per your regular routine starting tomorrow.  Take tylenol and ibuprofen as needed for pain control, alternating every 4-6 hours.  Take Roxicodone for breakthrough pain. Take colace for constipation related to narcotic pain medication. Do not pick at the dermabond glue on your incision sites.  Do not return to work until you are scheduled to work on Monday, and minimize heavy lifting and large movements with your right arm for the next week.        Lipoma Removal, Care After Refer to this sheet in the next few weeks. These instructions provide you with information about caring for yourself after your procedure. Your health care provider may also give you more specific instructions. Your treatment has been planned according to current medical practices, but problems sometimes occur. Call your health care provider if you have any problems or questions after your procedure. What can I expect after the procedure? After the procedure, it is common to have:  Mild pain.  Swelling.  Bruising.  Follow these instructions at home:  Bathing  Do not take baths, swim, or use a hot tub until your health care provider approves. Ask your health care provider if you can take showers. You may only be allowed to take sponge baths for bathing.  Keep your bandage (dressing) dry until your health care provider says it can be removed. Incision care   Follow instructions from your health care provider about how to take care of your incision. Make sure you: ? Wash your hands with soap and water before you change your bandage (dressing). If soap and water are not available, use hand sanitizer. ? Change your dressing as told by your health care provider. ? Leave stitches (sutures), skin glue, or adhesive strips in place. These skin closures may need to stay in place for 2 weeks or longer. If adhesive strip edges start to loosen and curl up, you may trim the loose edges. Do not  remove adhesive strips completely unless your health care provider tells you to do that.  Check your incision area every day for signs of infection. Check for: ? More redness, swelling, or pain. ? Fluid or blood. ? Warmth. ? Pus or a bad smell. Driving  Do not drive or operate heavy machinery while taking prescription pain medicine.  Do not drive for 24 hours if you received a medicine to help you relax (sedative) during your procedure.  Ask your health care provider when it is safe for you to drive. General instructions  Take over-the-counter and prescription medicines only as told by your health care provider.  Do not use any tobacco products, such as cigarettes, chewing tobacco, and e-cigarettes. These can delay healing. If you need help quitting, ask your health care provider.  Return to your normal activities as told by your health care provider. Ask your health care provider what activities are safe for you.  Keep all follow-up visits as told by your health care provider. This is important. Contact a health care provider if:  You have more redness, swelling, or pain around your incision.  You have fluid or blood coming from your incision.  Your incision feels warm to the touch.  You have pus or a bad smell coming from your incision.  You have pain that does not get better with medicine. Get help right away if:  You have chills or a fever.  You have severe pain. This information is not intended to replace advice given to you by your  health care provider. Make sure you discuss any questions you have with your health care provider. Document Released: 05/30/2015 Document Revised: 08/27/2015 Document Reviewed: 05/30/2015 Elsevier Interactive Patient Education  2018 [place] POST-ANESTHESIA  IMMEDIATELY FOLLOWING SURGERY:  Do not drive or operate machinery for the first twenty four hours after surgery.  Do not make any important decisions  for twenty four hours after surgery or while taking narcotic pain medications or sedatives.  If you develop intractable nausea and vomiting or a severe headache please notify your doctor immediately.  FOLLOW-UP:  Please make an appointment with your surgeon as instructed. You do not need to follow up with anesthesia unless specifically instructed to do so.  WOUND CARE INSTRUCTIONS (if applicable):  Keep a dry clean dressing on the anesthesia/puncture wound site if there is drainage.  Once the wound has quit draining you may leave it open to air.  Generally you should leave the bandage intact for twenty four hours unless there is drainage.  If the epidural site drains for more than 36-48 hours please call the anesthesia department.  QUESTIONS?:  Please feel free to call your physician or the hospital operator if you have any questions, and they will be happy to assist you.

## 2017-01-13 NOTE — Anesthesia Preprocedure Evaluation (Signed)
Anesthesia Evaluation  Patient identified by MRN, date of birth, ID band Patient awake    Reviewed: Allergy & Precautions, NPO status , Patient's Chart, lab work & pertinent test results  Airway Mallampati: II  TM Distance: >3 FB Neck ROM: Full    Dental  (+) Partial Upper   Pulmonary Current Smoker,    breath sounds clear to auscultation       Cardiovascular hypertension, Pt. 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  Reproductive/Obstetrics                             Anesthesia Physical Anesthesia Plan  ASA: III  Anesthesia Plan: General   Post-op Pain Management:    Induction: Intravenous  PONV Risk Score and Plan:   Airway Management Planned: LMA  Additional Equipment:   Intra-op Plan:   Post-operative Plan: Extubation in OR  Informed Consent: I have reviewed the patients History and Physical, chart, labs and discussed the procedure including the risks, benefits and alternatives for the proposed anesthesia with the patient or authorized representative who has indicated his/her understanding and acceptance.     Plan Discussed with:   Anesthesia Plan Comments:         Anesthesia Quick Evaluation

## 2017-01-13 NOTE — H&P (View-Only) (Signed)
[facility] History and Physical  Reason for Referral: Right arm lipoma Referring Physician:  Dr. Frances Furbish, MD   Chief Complaint    Lipoma      [name] is a 46 y.o. male.  HPI: Mr. Denio is a very pleasant gentleman with history of Hep C, substance abuse, and GERD who comes in today for evaluation of a right arm mass that he reports has been there for 10-12 years.  Over that time it has grown from about < 1cm to 4cm+ and does not cause him any pain and does not limit movement. The mass does get in the way when he is working, and he occasionally hits it when working.  He denies any other personal history of cancer or skin lesion in himself. His daughter does have a cyst/ mass on her neck.    He is schedule for a colonoscopy with Dr. Oneida Alar Thursday per his report. He is doing this to potentially get treatment for the Hep C.   Past Medical History:  Diagnosis Date  . EtOH dependence ([place])    QUIT AUG 8.  Marland Kitchen GERD without esophagitis   . Hepatitis C carrier ([place])   . Polysubstance dependence ([place])    COCAINE-LAST DOSE AUG 8    Past Surgical History:  Procedure Laterality Date  . ANEURYSM COILING     AGE 20 x2  . LAPAROSCOPIC APPENDECTOMY      Family History  Problem Relation Age of Onset  . Stomach cancer Mother   . Bone cancer Sister 13  . Colon cancer Brother 23  . Colon polyps Neg Hx     Social History  Substance Use Topics  . Smoking status: Current Every Day Smoker    Packs/day: 0.50    Types: Cigarettes  . Smokeless tobacco: Never Used  . Alcohol use No     Comment: quitting-none since 11/04/16    Medications: I have reviewed the patient's current medications. Allergies as of 12/29/2016   No Known Allergies     Medication List       Accurate as of 12/29/16 10:38 AM. Always use your most recent med list.          amLODipine 10 MG tablet Commonly known as:  NORVASC Take 10 mg by mouth daily.   aspirin EC 81 MG tablet Take 81 mg  by mouth daily.   lisinopril 10 MG tablet Commonly known as:  PRINIVIL,ZESTRIL Take 1 tablet (10 mg total) by mouth daily. For high blood pressure   metoprolol tartrate 25 MG tablet Commonly known as:  LOPRESSOR Take 12.5 mg by mouth 2 (two) times daily.   mirtazapine 7.5 MG tablet Commonly known as:  REMERON Take 1 tablet (7.5 mg total) by mouth at bedtime. For sleep   naproxen sodium 220 MG tablet Commonly known as:  ANAPROX Take 220 mg by mouth 2 (two) times daily as needed (for pain.).   pantoprazole 40 MG tablet Commonly known as:  PROTONIX Take 1 tablet (40 mg total) by mouth daily. For acid reflux   polyethylene glycol-electrolytes 420 g solution Commonly known as:  TRILYTE Take 4,000 mLs by mouth as directed.   QUEtiapine 300 MG tablet Commonly known as:  SEROQUEL Take 300 mg by mouth daily.        ROS:  A comprehensive review of systems was negative except for: right arm lesion  Blood pressure (!) 142/92, pulse 90, temperature 98.6 F (37 C), resp. rate 18, height 5\' 4"  (  1.626 m), weight 158 lb (71.7 kg). Physical Exam  Constitutional: He is oriented to person, place, and time and well-developed, well-nourished, and in no distress.  Cardiovascular: Normal rate and regular rhythm.   Pulmonary/Chest: Effort normal and breath sounds normal.  Abdominal: Soft. Bowel sounds are normal.  Musculoskeletal: Normal range of motion.  Neurological: He is alert and oriented to person, place, and time. GCS score is 15.  Skin: Skin is warm and dry.  Right dorsal forearm 4cm superficial lesion, does not move with flexion or extension of wrist, nontender, mobile  Psychiatric: Mood, memory, affect and judgment normal.    Results: None    Assessment & Plan:  PETRO TALENT is a 46 y.o. 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  Given the location I am concerned that if this goes deep that it could involve  tendons or muscles and might need referral to a hand specialist.  Based on the exam, the lesion feels superficial and has no signs of attachment to tendons/ nerves, etc.  To confirm will get an [place] to assess depth.  -[place] of lipoma on right forearm  -Plan for excision of the lipoma pending the [place] results  -[place] scheduled for 10/8 with preop evaluation, and surgery 10/12    The risk and benefits of the procedure were discussed with the patient including but not limited to the risk of bleeding, the risk of infection, and the risk of this being deep and requiring a more extensive excision.  Also discussed reason for the [place] to assess depth.  We discussed that this is likely a lipoma based on the description and location, but that it could be a lesion like a liposarcoma, and that this could require further testing/ surgery.    All questions were answered to the satisfaction of the patient. After careful consideration, TRAVARUS TRUDO has decided to proceed with excision after the [place]. I will call him with the [place] results.    Virl Cagey 12/29/2016, 10:38 AM

## 2017-01-13 NOTE — Anesthesia Procedure Notes (Signed)
Procedure Name: LMA Insertion Date/Time: 01/13/2017 7:42 AM Performed by: Charmaine Downs Pre-anesthesia Checklist: Patient identified, Patient being monitored, Emergency Drugs available, Timeout performed and Suction available Patient Re-evaluated:Patient Re-evaluated prior to induction Oxygen Delivery Method: Circle System Utilized Preoxygenation: Pre-oxygenation with 100% oxygen Induction Type: IV induction Ventilation: Mask ventilation without difficulty LMA: LMA inserted LMA Size: 4.0 Number of attempts: 1 Placement Confirmation: positive ETCO2 and breath sounds checked- equal and bilateral Tube secured with: Tape Dental Injury: Teeth and Oropharynx as per pre-operative assessment

## 2017-01-14 ENCOUNTER — Encounter (HOSPITAL_COMMUNITY): Payer: Self-pay | Admitting: General Surgery

## 2017-01-21 ENCOUNTER — Ambulatory Visit: Payer: BLUE CROSS/BLUE SHIELD | Admitting: General Surgery

## 2017-01-21 ENCOUNTER — Telehealth: Payer: Self-pay | Admitting: Gastroenterology

## 2017-01-21 DIAGNOSIS — B182 Chronic viral hepatitis C: Secondary | ICD-10-CM

## 2017-01-21 NOTE — Telephone Encounter (Signed)
PLEASE CALL PT. SUBMISSION OF HEP C PAPERWORK WAS DELAYED, BUT BECAUSE HE IS AN AFRICAN AMERICAN MALE WITH HEPATITIS C. IT IS RECOMMEND 12 WEEKS OF THERAPY IF YOUR INSURANCE WILL ALLOW IT. OTHERWISE WE WILL TREAT FOR 8 WEEKS. HE WILL NEED HEP C RNA, CBC, CMP 4 WEEKS AFTER STARTING MEDICATION.

## 2017-01-22 ENCOUNTER — Telehealth: Payer: Self-pay | Admitting: Gastroenterology

## 2017-01-22 NOTE — Telephone Encounter (Signed)
Pt's wife, Randell Patient, called to follow up on patient getting Hep C treatment. Please call her at [phone] or either at [phone] (Dr DonDiego's office)

## 2017-01-22 NOTE — Telephone Encounter (Signed)
Routing to [employer]

## 2017-01-22 NOTE — Telephone Encounter (Signed)
Spoke with pts spouse and she is aware that we are working on pts paperwork. Our office will notify pt when its complete.  Routing message.

## 2017-01-22 NOTE — Telephone Encounter (Signed)
See other phone note (pt's wife is aware that we will be completing paperwork).

## 2017-01-25 NOTE — Telephone Encounter (Signed)
HepC info faxed to [facility].

## 2017-01-25 NOTE — Telephone Encounter (Signed)
Received fax from [place]. [facility] is unable to provide the medication. [employer] will be in touch.

## 2017-01-25 NOTE — Telephone Encounter (Signed)
Hep C info faxed to [facility].

## 2017-01-26 ENCOUNTER — Ambulatory Visit: Payer: BLUE CROSS/BLUE SHIELD | Admitting: General Surgery

## 2017-01-27 NOTE — Telephone Encounter (Signed)
Received fax from [facility]. Harvoni was approved. Reference number: [id number]. Effective dates of the authorization 01/25/17-04/19/17.

## 2017-02-01 ENCOUNTER — Ambulatory Visit: Payer: Self-pay | Admitting: Cardiovascular Disease

## 2017-02-04 ENCOUNTER — Encounter: Payer: Self-pay | Admitting: Cardiovascular Disease

## 2017-02-09 NOTE — Telephone Encounter (Signed)
[place]. Call was transferred to their [facility] Pharm[facility] (phone [phone]). Spoke to [place]. He didn't have info for pt's Harvoni. He will have someone contact [facility]. Debbra Riding phone number to [facility].

## 2017-02-09 NOTE — Telephone Encounter (Signed)
Called [facility] to f/u on Harvoni. Harvoni info was sent to [employer]. Phone [phone], fax [phone]. [facility] will fax form to our office.

## 2017-02-09 NOTE — Telephone Encounter (Signed)
Received fax from [place] dated 01/25/17. Fax states: pt's coverage requires him to [facility]. [facility] submitted the PA to [facility]. The prescription has been triaged to the dispensing pharmacy. Estimated co-pay will be determined by dispensing pharmacy.

## 2017-02-10 MED ORDER — LEDIPASVIR-SOFOSBUVIR 90-400 MG PO TABS
1.0000 | ORAL_TABLET | Freq: Every day | ORAL | 2 refills | Status: DC
Start: 2017-02-10 — End: 2017-06-28

## 2017-02-10 NOTE — Telephone Encounter (Signed)
Rx sent as requested. Please call the patient and tell him. Also, tell him he will need to stop taking his acid blocker while he is taking Harvoni. Check if he has signed the copy of his med list/agreement not to take any new medications unless checking with [place] first (see JL if you're not sure what I mean).  If his GERD symptoms become intolerable, he can call us and we can discuss options.

## 2017-02-10 NOTE — Addendum Note (Signed)
Addended by: Gordy Levan, Sonyia Muro A on: 02/10/2017 01:38 PM   Modules accepted: Orders

## 2017-02-10 NOTE — Telephone Encounter (Signed)
Called and informed pt. He is aware to not start the [place] if it is sent to his home. He will come to our office to sign med list and receive instructions.

## 2017-02-10 NOTE — Telephone Encounter (Signed)
See other phone note

## 2017-02-10 NOTE — Telephone Encounter (Signed)
T/C from [employer] in reference to the pt's prescription for [place]. Gerald Stabs can be reached at [phone].  Routing to [place].

## 2017-02-10 NOTE — Telephone Encounter (Signed)
Called and spoke to Frannie at [employer]. He said he called [facility] yesterday and was unable to get any information re: pt's Harvoni. Informed him that I received fax from [place] yesterday that said the prescription was triaged to [place] but he said they never received the prescription. He requested for Harvoni rx to be e-prescribed to [place]. Routing to [facility] to send in prescription in [facility]'s absence. The prior authorization is effective 01/25/17-04/19/17.

## 2017-02-10 NOTE — Telephone Encounter (Signed)
Gerald Stabs at [employer] called office. Pt's employer requires him to get the [place] from [facility]. [place] triaged the prescription to [facility]. He said [facility] would be in touch with the pat. Advised him that the Harvoni would need to be delivered to our office and not sent to the pt as the pt will need to sign med list and be given instructions. Gerald Stabs said he will call [facility] and inform them to send med to our office.  Pt will sign medication list when Harvoni is picked up at office.

## 2017-02-15 NOTE — Telephone Encounter (Signed)
Tried to call pt to see if he has heard anything from the pharmacy about his Harvoni, LMOVM.

## 2017-02-17 NOTE — Telephone Encounter (Signed)
[facility] [phone]) and spoke to Dominica. They had Harvoni rx and was waiting for pt to call them to give consent for it to be delivered. Advised her that medication needs to be delivered to our office. She said date of delivery would be 02/23/17 d/t holiday coming up. Called and informed pt.

## 2017-02-17 NOTE — Telephone Encounter (Signed)
Called pt. Has hasn't received a call from the pharmacy.

## 2017-02-24 NOTE — Telephone Encounter (Signed)
Harvoni arrived at office. Dr. Oneida Alar, please advise instructions for how pt should take the [place].

## 2017-03-01 NOTE — Telephone Encounter (Signed)
Patient's wife called and made aware that we are waiting on instructions from Dr. Eden Lathe on how to take his Harvoni.  Please call the wife at [phone] to schedule a time for them to come by the office to receive instructions.  Routing to [employer]

## 2017-03-02 ENCOUNTER — Other Ambulatory Visit: Payer: Self-pay

## 2017-03-02 DIAGNOSIS — B182 Chronic viral hepatitis C: Secondary | ICD-10-CM

## 2017-03-02 NOTE — Telephone Encounter (Signed)
Called pt's wife. She said he will probably come by office tomorrow afternoon to pick up Harvoni since he will be off work.

## 2017-03-03 ENCOUNTER — Telehealth: Payer: Self-pay | Admitting: Gastroenterology

## 2017-03-03 NOTE — Telephone Encounter (Signed)
Tried to call pt's wife, no answer, LMOVM for return call.

## 2017-03-03 NOTE — Telephone Encounter (Signed)
PT SHOULD TAKE HARVONI DAILY FOR 12 WEEKS.Marland Kitchen HE SHOULD NOT TAKE PROTONIX 4 HRS BEFORE OR AFTER TAKING THE HARVONI.

## 2017-03-03 NOTE — Telephone Encounter (Signed)
Forwarding to [place] who is handling pt's Harvoni.

## 2017-03-03 NOTE — Telephone Encounter (Signed)
JL advised that Dr. Oneida Alar is ok with pt starting Harvoni after the first of next year, but he needs to let our office know when he starts med. Called and informed his wife. He will pick up Harvoni tomorrow morning.

## 2017-03-03 NOTE — Telephone Encounter (Signed)
[phone] please call patient, she has questions about her husbands harvoni treatment

## 2017-03-03 NOTE — Telephone Encounter (Signed)
See other phone note for today. Pt wants to wait until after first of the year to start Harvoni d/t the upcoming holidays and his anniversary. He wants to drink alcohol. Dr. Oneida Alar is aware. He needs to let our office know when he starts med. His wife said he will come by office tomorrow morning to pick up med and instructions.

## 2017-03-03 NOTE — Telephone Encounter (Signed)
Pt's wife called. He wants to know if he can wait till after first of next year to start the Harvoni d/t the holidays and his anniversary coming up. He wants to be able to celebrate. He drinks beer and is aware that he can't have any alcohol while taking Harvoni. Advised her I would talk with Dr. Oneida Alar and call her back.

## 2017-03-03 NOTE — Telephone Encounter (Signed)
Spoke with SLF- she said it was ok to leave the instructions letter like it is. If the pt can tolerate not taking the protonix while on treatment, that was fine. If he should call and say his GERD was intolerable, he can take the protonix, either 4 hours before or 4 hours after taking harvoni. Routing to [place] for [employer]

## 2017-03-04 NOTE — Telephone Encounter (Signed)
Pt came by office today, picked up [place]. He is aware to contact office when he starts Harvoni, have labs done 4 weeks after starting med. Instructions letter given to pt, he verbalized understanding. He is aware to call [facility] for refills and have med sent to our office. Also aware that he needs to get the 2 refills prior to 04/19/17 as PA is effective until 04/19/17.

## 2017-03-17 ENCOUNTER — Telehealth: Payer: Self-pay | Admitting: Gastroenterology

## 2017-03-17 NOTE — Telephone Encounter (Signed)
PATIENT WIFE CALLED AND STATED THAT HIS MEDICATION SHOULD BE DELIVERED HERE TOMORROW 03/18/17

## 2017-03-17 NOTE — Telephone Encounter (Signed)
Noted  

## 2017-03-17 NOTE — Telephone Encounter (Signed)
Forwarding FYI to [place].

## 2017-03-18 NOTE — Telephone Encounter (Signed)
Tried to call pt's wife, LMOVM to inform her that his Harvoni is at the office for him to pick up and sign.

## 2017-03-18 NOTE — Telephone Encounter (Signed)
Tried to call pt's wife, LMOVM. Informed her that Bayard Males is at the office for him to pick up and sign.

## 2017-03-29 NOTE — Telephone Encounter (Signed)
Called pt. Informed his that his Harvoni is at the office for him to pick up.

## 2017-03-29 NOTE — Telephone Encounter (Signed)
Called and informed pt that his Harvoni is at the office for him to pick up.

## 2017-03-31 NOTE — Telephone Encounter (Signed)
Pt's wife picked up Harvoni. She stated he started the Harvoni yesterday (03/30/17).  Routing to SLF as FYI.

## 2017-04-07 ENCOUNTER — Ambulatory Visit: Payer: BLUE CROSS/BLUE SHIELD | Admitting: Gastroenterology

## 2017-04-12 NOTE — Telephone Encounter (Signed)
PLEASE CALL PT @ JAN 31 AND REMIND HIM TO GET HIS LABS DRAWN.

## 2017-04-12 NOTE — Telephone Encounter (Signed)
Routing to [place] as she has been working on this.

## 2017-04-14 NOTE — Telephone Encounter (Signed)
Called and informed pts wife

## 2017-04-14 NOTE — Telephone Encounter (Signed)
Pt's wife called office to see if Harvoni refill has been delivered. They ordered refill last week. Our office hasn't yet received Harvoni. Reminded his wife that he needs to have labs done 4 weeks after starting the Harvoni.  Dr. Oneida Alar, he wants to try to stop smoking. His wife wants to know if it would be ok for him to take Chantix while taking Harvoni?

## 2017-04-14 NOTE — Telephone Encounter (Signed)
PLEASE CALL PT. He should complete Harvoni and then see PCP FOR CHANTIX.

## 2017-04-19 NOTE — Telephone Encounter (Signed)
Called pt and his wife. Our office hasn't received Harvoni refill. His wife will contact the pharmacy.

## 2017-04-22 NOTE — Telephone Encounter (Signed)
[facility] LMOVM that pt's Harvoni needed PA. [facility]. His last refill was called in 04/21/17 and his PA was valid until 04/19/17. Rep advised I would need to go through his plan to get PA [facility] [phone]).  Tried to call pt's wife, no answer, LMOVM for her to call office. She then called office. She states she called in refill 04/09/17, call was disconnected. She spoke to someone else when she called back. She called [facility] yesterday after I told her 04/19/17 that we hadn't received Harvoni. The lady she spoke to yesterday told her she seen where refill was called in 04/09/17 and apologized that it wasn't processed. The lady advised she would expedite the refill and our office would receive med today. His wife then received a call last night that our office would need to get PA for medication. His wife states she will call [facility]. Advised her to let me know what she finds out.

## 2017-04-22 NOTE — Telephone Encounter (Signed)
[place]. They do not handle PA for this pt. Advised me to call the pt's plan [facility] [phone]). I called [employer] [place]. Informed rep of situation. She advised that PA would need to be completed. She will fax form to our office to complete.

## 2017-04-22 NOTE — Telephone Encounter (Signed)
Pt's wife called office. She spoke to someone in the insurance dept at [employer]. They said a PA needed to be done because the insurance dept didn't receive refill request until 04/21/17. I advised her I would try to do PA. She states she will call [facility] back later today to see if she can speak to a supervisor since [facility] didn't process refill request 04/09/17 when she called.

## 2017-04-23 NOTE — Telephone Encounter (Signed)
Submitted PA request for Harvoni via covermymeds website.

## 2017-04-26 NOTE — Telephone Encounter (Signed)
Received authorization notification fax from [facility]. Harvoni approved for 4 weeks of therapy (28 tablets). Reference number: [id number]. Effective dates of authorization: 04/23/17-05/21/17. [facility], spoke to [facility]. Informed her of authorization for last refill. She sent email to insurance dept for update. She states benefits will be processed and account updated. Requested medication be sent to our office for pt to pickup. She advised me to call back to check status.

## 2017-04-29 NOTE — Telephone Encounter (Signed)
Pt's wife called office. She spoke to someone at [place] and was told the Harvoni would be delivered to our office tomorrow. I called [facility] and spoke to [place]. She stated claim was rejected, but she sees PA was received. She stated the insurance dept sent an email to someone and was awaiting response. She was unsure what the issue is. Called and informed pt's wife. She will be taking pt today to have blood work done.

## 2017-05-03 ENCOUNTER — Telehealth: Payer: Self-pay

## 2017-05-03 DIAGNOSIS — B182 Chronic viral hepatitis C: Secondary | ICD-10-CM

## 2017-05-03 NOTE — Telephone Encounter (Signed)
Received a Voicemail from [employer], pts Harvoni was suppose to be shipped 04/29/17 and was cancelled due to needed a new PA for pt. PA was resolved and a shipment has been R/s to 05/05/17. Our office will call pt when medication arrives.  LMOM, spouse notified.

## 2017-05-05 LAB — COMPREHENSIVE METABOLIC PANEL
AG RATIO: 1 (calc) (ref 1.0–2.5)
ALT: 9 U/L (ref 9–46)
AST: 15 U/L (ref 10–40)
Albumin: 3.9 g/dL (ref 3.6–5.1)
Alkaline phosphatase (APISO): 75 U/L (ref 40–115)
BILIRUBIN TOTAL: 0.7 mg/dL (ref 0.2–1.2)
BUN: 8 mg/dL (ref 7–25)
CALCIUM: 9.2 mg/dL (ref 8.6–10.3)
CO2: 26 mmol/L (ref 20–32)
Chloride: 108 mmol/L (ref 98–110)
Creat: 0.96 mg/dL (ref 0.60–1.35)
GLUCOSE: 82 mg/dL (ref 65–139)
Globulin: 4 g/dL (calc) — ABNORMAL HIGH (ref 1.9–3.7)
Potassium: 3.9 mmol/L (ref 3.5–5.3)
Sodium: 138 mmol/L (ref 135–146)
Total Protein: 7.9 g/dL (ref 6.1–8.1)

## 2017-05-05 LAB — CBC WITH DIFFERENTIAL/PLATELET
BASOS ABS: 31 {cells}/uL (ref 0–200)
Basophils Relative: 0.5 %
EOS ABS: 360 {cells}/uL (ref 15–500)
Eosinophils Relative: 5.8 %
HEMATOCRIT: 41.1 % (ref 38.5–50.0)
HEMOGLOBIN: 14.4 g/dL (ref 13.2–17.1)
Lymphs Abs: 3088 cells/uL (ref 850–3900)
MCH: 29 pg (ref 27.0–33.0)
MCHC: 35 g/dL (ref 32.0–36.0)
MCV: 82.9 fL (ref 80.0–100.0)
MONOS PCT: 12.2 %
MPV: 10.5 fL (ref 7.5–12.5)
NEUTROS ABS: 1965 {cells}/uL (ref 1500–7800)
NEUTROS PCT: 31.7 %
Platelets: 235 10*3/uL (ref 140–400)
RBC: 4.96 10*6/uL (ref 4.20–5.80)
RDW: 12.6 % (ref 11.0–15.0)
Total Lymphocyte: 49.8 %
WBC mixed population: 756 cells/uL (ref 200–950)
WBC: 6.2 10*3/uL (ref 3.8–10.8)

## 2017-05-05 LAB — HEPATITIS C RNA QUANTITATIVE
HCV QUANT LOG: DETECTED {Log_IU}/mL — AB
HCV RNA, PCR, QN: DETECTED [IU]/mL — AB

## 2017-05-06 NOTE — Addendum Note (Signed)
Addended by: Danie Binder on: 05/06/2017 07:59 AM   Modules accepted: Orders

## 2017-05-06 NOTE — Telephone Encounter (Signed)
HEPATITIS C LEVEL HAS IS BARELY DETECTABLE. CONTINUE THERAPY AND REPEAT HEPATITIS C VIRAL LOAD IN 2 WEEKS.

## 2017-05-06 NOTE — Telephone Encounter (Signed)
Pt's wife called office. She received message.

## 2017-05-06 NOTE — Telephone Encounter (Signed)
Last refill of Harvoni was delivered to office. Tried to call pt's wife, no answer, LMOVM that Harvoni can be picked up.

## 2017-05-07 ENCOUNTER — Other Ambulatory Visit: Payer: Self-pay

## 2017-05-07 DIAGNOSIS — B182 Chronic viral hepatitis C: Secondary | ICD-10-CM

## 2017-05-07 NOTE — Telephone Encounter (Signed)
Pt's wife Randell Patient is aware for pt to do labs in 2 weeks.

## 2017-05-20 NOTE — Telephone Encounter (Signed)
Pt's wife picked up Harvoni.

## 2017-05-25 ENCOUNTER — Ambulatory Visit: Payer: BLUE CROSS/BLUE SHIELD | Admitting: Gastroenterology

## 2017-06-22 ENCOUNTER — Emergency Department (HOSPITAL_COMMUNITY)
Admission: EM | Admit: 2017-06-22 | Discharge: 2017-06-23 | Disposition: A | Discharge status: Other Acute Inpatient Hospital | Payer: BLUE CROSS/BLUE SHIELD | Attending: Emergency Medicine | Admitting: Emergency Medicine

## 2017-06-22 ENCOUNTER — Other Ambulatory Visit: Payer: Self-pay

## 2017-06-22 ENCOUNTER — Ambulatory Visit (HOSPITAL_COMMUNITY)
Admission: RE | Admit: 2017-06-22 | Discharge: 2017-06-22 | Disposition: A | Discharge status: Home / Self Care | Payer: BLUE CROSS/BLUE SHIELD | Source: Home / Self Care | Attending: Psychiatry | Admitting: Psychiatry

## 2017-06-22 ENCOUNTER — Encounter (HOSPITAL_COMMUNITY): Payer: Self-pay

## 2017-06-22 DIAGNOSIS — F1910 Other psychoactive substance abuse, uncomplicated: Secondary | ICD-10-CM

## 2017-06-22 DIAGNOSIS — I10 Essential (primary) hypertension: Secondary | ICD-10-CM | POA: Diagnosis not present

## 2017-06-22 DIAGNOSIS — F17210 Nicotine dependence, cigarettes, uncomplicated: Secondary | ICD-10-CM | POA: Diagnosis not present

## 2017-06-22 DIAGNOSIS — Z79899 Other long term (current) drug therapy: Secondary | ICD-10-CM | POA: Diagnosis not present

## 2017-06-22 DIAGNOSIS — F1994 Other psychoactive substance use, unspecified with psychoactive substance-induced mood disorder: Secondary | ICD-10-CM | POA: Diagnosis not present

## 2017-06-22 DIAGNOSIS — F329 Major depressive disorder, single episode, unspecified: Secondary | ICD-10-CM

## 2017-06-22 LAB — CBC
HCT: 44.8 % (ref 39.0–52.0)
HEMOGLOBIN: 15.3 g/dL (ref 13.0–17.0)
MCH: 30.1 pg (ref 26.0–34.0)
MCHC: 34.2 g/dL (ref 30.0–36.0)
MCV: 88 fL (ref 78.0–100.0)
PLATELETS: 240 10*3/uL (ref 150–400)
RBC: 5.09 MIL/uL (ref 4.22–5.81)
RDW: 14.6 % (ref 11.5–15.5)
WBC: 8.5 10*3/uL (ref 4.0–10.5)

## 2017-06-22 LAB — COMPREHENSIVE METABOLIC PANEL
ALBUMIN: 4.4 g/dL (ref 3.5–5.0)
ALT: 17 U/L (ref 17–63)
ANION GAP: 13 (ref 5–15)
AST: 23 U/L (ref 15–41)
Alkaline Phosphatase: 94 U/L (ref 38–126)
BILIRUBIN TOTAL: 1 mg/dL (ref 0.3–1.2)
BUN: 7 mg/dL (ref 6–20)
CO2: 27 mmol/L (ref 22–32)
Calcium: 9.4 mg/dL (ref 8.9–10.3)
Chloride: 100 mmol/L — ABNORMAL LOW (ref 101–111)
Creatinine, Ser: 0.89 mg/dL (ref 0.61–1.24)
GFR calc non Af Amer: >60 mL/min (ref >60)
GLUCOSE: 102 mg/dL — AB (ref 65–99)
POTASSIUM: 3.9 mmol/L (ref 3.5–5.1)
SODIUM: 140 mmol/L (ref 135–145)
Total Protein: 9.3 g/dL — ABNORMAL HIGH (ref 6.5–8.1)

## 2017-06-22 LAB — ETHANOL: Alcohol, Ethyl (B): 80 mg/dL — ABNORMAL HIGH (ref <10)

## 2017-06-22 NOTE — BH Assessment (Addendum)
Assessment Note  [name] is an 47 y.o. male.  The pt came in with his wife after using about 40 dollars worth of crack cocaine and drinking about 6 beers.  The pt stated he used the crack 30 minutes prior to arrival and last had a drink around 3pm.  He typically drinks about 12 beers daily.  His crack use depends on how much money he has and he uses about twice a week.  His longest period of sobriety was for 4 years while in jail.  He was in jail due to drug possession.  He described his primary stressor as being separated from his wife.  He is currently living alone.  In August 2018 the pt was admitted to [facility] due to substance use and depression.  He later went to [facility] for 3 weeks.  He reports he went to a counselor shortly after, but doesn't remember where he went and stated he did not go for long.  He is currently taking mental health medication prescribed by his family doctor.  The pt reports he has passing thoughts of suicide, but has never made a plan and is not having suicidal thoughts now.  The pt is getting 3-4 hours of sleep and is not eating well.  The pt denies current SI, HI, and psychosis.  Diagnosis: F33.1 Major depressive disorder, Recurrent episode, Moderate F10.20 Alcohol use disorder, Severe F14.20 Cocaine use disorder, Severe   Past Medical History:  Past Medical History:  Diagnosis Date  . Anxiety   . EtOH dependence ([place])    QUIT AUG 8.  [place] GERD without esophagitis   . Hepatitis C carrier ([place])   . Hypercholesteremia   . Hypertension   . Polysubstance dependence ([facility])    COCAINE-LAST DOSE AUG 8  . Stroke [facility])    has some ST memory deficits from stroke.    Past Surgical History:  Procedure Laterality Date  . ANEURYSM COILING     AGE 48 x2  . APPENDECTOMY    . BIOPSY  01/05/2017   Procedure: BIOPSY;  Surgeon: Danie Binder, MD;  Location[facility];  Service: Endoscopy;;  Duodenal and Gastric  . COLONOSCOPY WITH PROPOFOL N/A 01/05/2017    Procedure: COLONOSCOPY WITH PROPOFOL;  Surgeon: Danie Binder, MD;  [facility];  Service: Endoscopy;  Laterality: N/A;  10:00am  . ESOPHAGOGASTRODUODENOSCOPY (EGD) WITH PROPOFOL N/A 01/05/2017   Procedure: ESOPHAGOGASTRODUODENOSCOPY (EGD) WITH PROPOFOL;  Surgeon: Danie Binder, MD;  Location[facility];  Service: Endoscopy;  Laterality: N/A;  . LAPAROSCOPIC APPENDECTOMY    . MASS EXCISION Right 01/13/2017   Procedure: EXCISION LIPOMA RIGHT ARM;  Surgeon: Virl Cagey, MD;  Location: [facility] ORS;  Service: General;  Laterality: Right;    Family History:  Family History  Problem Relation Age of Onset  . Stomach cancer Mother   . Bone cancer Sister 84  . Colon cancer Brother 39  . Colon polyps Neg Hx     Social History:  reports that he has been smoking cigarettes.  He has a 6.25 pack-year smoking history. He has never used smokeless tobacco. He reports that he has current or past drug history. Drug: "Crack" cocaine. He reports that he does not drink alcohol.  Additional Social History:  Alcohol / Drug Use Pain Medications: See MAR Prescriptions: See MAR Over the Counter: See MAR History of alcohol / drug use?: Yes Longest period of sobriety (when/how long): 4 years while in  jail Negative Consequences of Use: Legal Substance #1 Name of Substance 1: alcohol 1 - Age of First Use: 14 1 - Amount (size/oz): 12 pack of beer a day 1 - Frequency: daily 1 - Duration: 30 years 1 - Last Use / Amount: 06/22/2017 Substance #2 Name of Substance 2: Crack cocaine 2 - Age of First Use: early 84s 2 - Amount (size/oz): unknown 2 - Frequency: twice a week 2 - Duration: 25 years 2 - Last Use / Amount: 06/22/2017  CIWA:   COWS:    Allergies: No Known Allergies  Home Medications:  (Not in a hospital admission)  OB/GYN Status:  No LMP for male patient.  General Assessment Data Location of Assessment: [facility] Assessment Services TTS Assessment: In system Is this a Tele or  Face-to-Face Assessment?: Face-to-Face Is this an Initial Assessment or a Re-assessment for this encounter?: Initial Assessment Marital status: Separated Maiden name: NA Is patient pregnant?: Other (Comment) Living Arrangements: Alone Can pt return to current living arrangement?: Yes Admission Status: Voluntary Is patient capable of signing voluntary admission?: Yes Referral Source: Self/Family/Friend Insurance type: [place] Screening Exam ([place]) Medical Exam completed: Yes  Crisis Care Plan Living Arrangements: Alone Legal Guardian: Other:(self) Name of Psychiatrist: none Name of Therapist: none  Education Status Is patient currently in school?: No  Risk to self with the past 6 months Suicidal Ideation: No Has patient been a risk to self within the past 6 months prior to admission? : No Suicidal Intent: No Has patient had any suicidal intent within the past 6 months prior to admission? : No Is patient at risk for suicide?: No Suicidal Plan?: No Has patient had any suicidal plan within the past 6 months prior to admission? : No Access to Means: No What has been your use of drugs/alcohol within the last 12 months?: crack and alcohol use today Previous Attempts/Gestures: No How many times?: 0 Other Self Harm Risks: none Triggers for Past Attempts: None known Intentional Self Injurious Behavior: None Family Suicide History: No Recent stressful life event(s): Divorce(seperation from wife) Persecutory voices/beliefs?: No Depression: Yes Depression Symptoms: Insomnia, Feeling worthless/self pity Substance abuse history and/or treatment for substance abuse?: Yes Suicide prevention information given to non-admitted patients: Not applicable  Risk to Others within the past 6 months Homicidal Ideation: No Does patient have any lifetime risk of violence toward others beyond the six months prior to admission? : No Thoughts of Harm to Others: No Current  Homicidal Intent: No Current Homicidal Plan: No Access to Homicidal Means: No Identified Victim: none History of harm to others?: No Assessment of Violence: None Noted Violent Behavior Description: none Does patient have access to weapons?: No Criminal Charges Pending?: No Does patient have a court date: No Is patient on probation?: No  Psychosis Hallucinations: None noted Delusions: None noted  Mental Status Report Appearance/Hygiene: Unremarkable Eye Contact: Good Motor Activity: Unremarkable, Freedom of movement Speech: Logical/coherent Level of Consciousness: Alert Mood: Depressed Affect: Depressed Anxiety Level: None Thought Processes: Coherent, Relevant Judgement: Impaired Orientation: Person, Place, Time, Situation, Appropriate for developmental age Obsessive Compulsive Thoughts/Behaviors: None  Cognitive Functioning Concentration: Normal Memory: Recent Intact, Remote Intact Is patient IDD: No Is patient DD?: No Insight: Fair Impulse Control: Fair Appetite: Poor Have you had any weight changes? : No Change Sleep: Decreased Total Hours of Sleep: 4  ADLScreening [facility] Assessment Services) Patient's cognitive ability adequate to safely complete daily activities?: Yes Patient able to express need for assistance with ADLs?: Yes Independently  performs ADLs?: Yes (appropriate for developmental age)  Prior Inpatient Therapy Prior Inpatient Therapy: Yes Prior Therapy Dates: 10/2016 Prior Therapy Facilty/Provider(s): [facility], [place] Reason for Treatment: depression and SA  Prior Outpatient Therapy Prior Outpatient Therapy: No Does patient have an ACCT team?: No Does patient have Intensive In-House Services?  : No Does patient have [facility] services? : No Does patient have P4CC services?: No  ADL Screening (condition at time of admission) Patient's cognitive ability adequate to safely complete daily activities?: Yes Patient able to express need for  assistance with ADLs?: Yes Independently performs ADLs?: Yes (appropriate for developmental age)       Abuse/Neglect Assessment (Assessment to be complete while patient is alone) Abuse/Neglect Assessment Can Be Completed: Yes Physical Abuse: Denies Verbal Abuse: Denies Sexual Abuse: Denies Exploitation of patient/patient's resources: Denies Self-Neglect: Denies Values / Beliefs Cultural Requests During Hospitalization: None Spiritual Requests During Hospitalization: None Consults Spiritual Care Consult Needed: No Social Work Consult Needed: No      Additional Information 1:1 In Past 12 Months?: No CIRT Risk: No Elopement Risk: No Does patient have medical clearance?: No     Disposition:  Disposition Initial Assessment Completed for this Encounter: Yes Disposition of Patient: Admit, Movement to [facility] or [facility] ED Type of inpatient treatment program: Adult Patient refused recommended treatment: No Mode of transportation if patient is discharged?: N/A   PA Patriciaann Clan recommends inpatient treatment.  On Site Evaluation by:   Reviewed with Physician:    Enzo Montgomery 06/22/2017 10:04 PM

## 2017-06-22 NOTE — ED Notes (Signed)
Bed: [facility] Expected date:  Expected time:  Means of arrival:  Comments: 

## 2017-06-22 NOTE — H&P (Signed)
Behavioral Health Medical Screening Exam  [name] is an 47 y.o. male presents with wife requesting psych evaluation due to continued polysubstance abuse and depression without SI/SA or HI. He denies hx of Dt's or seizure d/o. He last drank alcohol this am and did crack cocaine prior to presenting for treatment. He is denying any CP, SOB, AMS changes, N/V or abd panis  Total Time spent with patient: 20 minutes  Psychiatric Specialty Exam: Physical Exam  Constitutional: He is oriented to person, place, and time. He appears well-developed and well-nourished. No distress.  HENT:  Head: Normocephalic.  Eyes: Pupils are equal, round, and reactive to light.  Respiratory: Effort normal and breath sounds normal. No respiratory distress.  Neurological: He is alert and oriented to person, place, and time. No cranial nerve deficit.  Skin: Skin is warm and dry. He is not diaphoretic.  Psychiatric: His speech is delayed. He is withdrawn. Cognition and memory are impaired. He expresses impulsivity and inappropriate judgment. He exhibits a depressed mood. He expresses no homicidal and no suicidal ideation. He expresses no suicidal plans and no homicidal plans.    Review of Systems  Constitutional: Negative for chills, diaphoresis, fever and malaise/fatigue.  HENT: Negative for tinnitus.   Eyes: Negative for blurred vision and double vision.  Respiratory: Negative for shortness of breath.   Cardiovascular: Negative for chest pain and palpitations.  Gastrointestinal: Negative for heartburn, nausea and vomiting.  Neurological: Negative for dizziness.  Psychiatric/Behavioral: Positive for depression and substance abuse.    There were no vitals taken for this visit.There is no height or weight on file to calculate BMI.  General Appearance: Disheveled  Eye Contact:  Poor  Speech:  Slow  Volume:  Decreased  Mood:  Depressed  Affect:  Congruent  Thought Process:  Goal Directed  Orientation:  Full  (Time, Place, and Person)  Thought Content:  Logical  Suicidal Thoughts:  No  Homicidal Thoughts:  No  Memory:  Immediate;   Fair  Judgement:  Poor  Insight:  Lacking  Psychomotor Activity:  Normal  Concentration: Concentration: Poor  Recall:  Poor  Fund of Knowledge:Poor  Language: Fair  Akathisia:  Negative  Handed:  Right  AIMS (if indicated):     Assets:  Desire for Improvement  Sleep:       Musculoskeletal: Strength & Muscle Tone: within normal limits Gait & Station: normal Patient leans: N/A  There were no vitals taken for this visit.  Recommendations:  Based on my evaluation the patient appears to have an emergency medical condition for which I recommend the patient be transferred to the emergency department for further evaluation.  Laverle Hobby, PA-C 06/22/2017, 9:29 PM

## 2017-06-22 NOTE — ED Triage Notes (Signed)
Pt reports that he wants help with cocaine and alcohol abuse. He reports that he recently used both around 330p. He denies SI/HI. He was accompanied over here by a [place] from [facility]. A&Ox4. Ambulatory.

## 2017-06-23 ENCOUNTER — Encounter (HOSPITAL_COMMUNITY): Payer: Self-pay | Admitting: *Deleted

## 2017-06-23 ENCOUNTER — Inpatient Hospital Stay (HOSPITAL_COMMUNITY)
Admission: AD | Admit: 2017-06-23 | Discharge: 2017-06-28 | DRG: 885 | Disposition: A | Discharge status: Home / Self Care | Payer: BLUE CROSS/BLUE SHIELD | Source: Intra-hospital | Attending: Psychiatry | Admitting: Psychiatry

## 2017-06-23 ENCOUNTER — Other Ambulatory Visit: Payer: Self-pay

## 2017-06-23 DIAGNOSIS — I69311 Memory deficit following cerebral infarction: Secondary | ICD-10-CM | POA: Diagnosis not present

## 2017-06-23 DIAGNOSIS — Z590 Homelessness: Secondary | ICD-10-CM | POA: Diagnosis not present

## 2017-06-23 DIAGNOSIS — K219 Gastro-esophageal reflux disease without esophagitis: Secondary | ICD-10-CM | POA: Diagnosis present

## 2017-06-23 DIAGNOSIS — B182 Chronic viral hepatitis C: Secondary | ICD-10-CM | POA: Diagnosis present

## 2017-06-23 DIAGNOSIS — Z630 Problems in relationship with spouse or partner: Secondary | ICD-10-CM | POA: Diagnosis not present

## 2017-06-23 DIAGNOSIS — I10 Essential (primary) hypertension: Secondary | ICD-10-CM | POA: Diagnosis present

## 2017-06-23 DIAGNOSIS — R45851 Suicidal ideations: Secondary | ICD-10-CM | POA: Diagnosis present

## 2017-06-23 DIAGNOSIS — F1020 Alcohol dependence, uncomplicated: Secondary | ICD-10-CM

## 2017-06-23 DIAGNOSIS — F1410 Cocaine abuse, uncomplicated: Secondary | ICD-10-CM | POA: Diagnosis not present

## 2017-06-23 DIAGNOSIS — Z79899 Other long term (current) drug therapy: Secondary | ICD-10-CM

## 2017-06-23 DIAGNOSIS — R4587 Impulsiveness: Secondary | ICD-10-CM | POA: Diagnosis not present

## 2017-06-23 DIAGNOSIS — F419 Anxiety disorder, unspecified: Secondary | ICD-10-CM | POA: Diagnosis present

## 2017-06-23 DIAGNOSIS — F333 Major depressive disorder, recurrent, severe with psychotic symptoms: Secondary | ICD-10-CM | POA: Diagnosis present

## 2017-06-23 DIAGNOSIS — F332 Major depressive disorder, recurrent severe without psychotic features: Secondary | ICD-10-CM | POA: Diagnosis present

## 2017-06-23 DIAGNOSIS — F17210 Nicotine dependence, cigarettes, uncomplicated: Secondary | ICD-10-CM | POA: Diagnosis not present

## 2017-06-23 DIAGNOSIS — R450 Nervousness: Secondary | ICD-10-CM | POA: Diagnosis not present

## 2017-06-23 DIAGNOSIS — E7800 Pure hypercholesterolemia, unspecified: Secondary | ICD-10-CM | POA: Diagnosis present

## 2017-06-23 DIAGNOSIS — F1994 Other psychoactive substance use, unspecified with psychoactive substance-induced mood disorder: Secondary | ICD-10-CM | POA: Diagnosis not present

## 2017-06-23 DIAGNOSIS — F1421 Cocaine dependence, in remission: Secondary | ICD-10-CM

## 2017-06-23 LAB — URINALYSIS, ROUTINE W REFLEX MICROSCOPIC
Bacteria, UA: NONE SEEN
Bilirubin Urine: NEGATIVE
Glucose, UA: NEGATIVE mg/dL
Hgb urine dipstick: NEGATIVE
Ketones, ur: NEGATIVE mg/dL
Leukocytes, UA: NEGATIVE
Nitrite: NEGATIVE
PH: 5 (ref 5.0–8.0)
Protein, ur: NEGATIVE mg/dL
SPECIFIC GRAVITY, URINE: 1.014 (ref 1.005–1.030)

## 2017-06-23 LAB — RAPID URINE DRUG SCREEN, HOSP PERFORMED
Amphetamines: NOT DETECTED
BARBITURATES: NOT DETECTED
Benzodiazepines: NOT DETECTED
Cocaine: POSITIVE — AB
Opiates: NOT DETECTED
TETRAHYDROCANNABINOL: NOT DETECTED

## 2017-06-23 LAB — I-STAT TROPONIN, ED: Troponin i, poc: 0 ng/mL (ref 0.00–0.08)

## 2017-06-23 MED ORDER — LORAZEPAM 2 MG/ML IJ SOLN: 0.0000 mg | Freq: Two times a day (BID) | INTRAMUSCULAR | Status: DC

## 2017-06-23 MED ORDER — BENZTROPINE MESYLATE 1 MG PO TABS: 1.0000 mg | ORAL_TABLET | Freq: Four times a day (QID) | ORAL | Status: DC | PRN

## 2017-06-23 MED ORDER — THIAMINE HCL 100 MG/ML IJ SOLN: 100.0000 mg | Freq: Every day | INTRAMUSCULAR | Status: DC

## 2017-06-23 MED ORDER — LORAZEPAM 1 MG PO TABS
0.0000 mg | ORAL_TABLET | Freq: Four times a day (QID) | ORAL | Status: DC
  Administered 2017-06-23: 2 mg via ORAL
  Filled 2017-06-23: qty 2

## 2017-06-23 MED ORDER — METOPROLOL TARTRATE 25 MG PO TABS
12.5000 mg | ORAL_TABLET | Freq: Two times a day (BID) | ORAL | Status: DC
  Administered 2017-06-24 – 2017-06-28 (×8): 12.5 mg via ORAL
  Filled 2017-06-23: qty 30
  Filled 2017-06-23: qty 1
  Filled 2017-06-23: qty 0.5
  Filled 2017-06-23: qty 30
  Filled 2017-06-23 (×8): qty 0.5
  Filled 2017-06-23 (×3): qty 1
  Filled 2017-06-23: qty 0.5

## 2017-06-23 MED ORDER — ALUM & MAG HYDROXIDE-SIMETH 200-200-20 MG/5ML PO SUSP: 30.0000 mL | ORAL | Status: DC | PRN

## 2017-06-23 MED ORDER — AMLODIPINE BESYLATE 5 MG PO TABS
5.0000 mg | ORAL_TABLET | Freq: Every day | ORAL | Status: DC
  Administered 2017-06-23: 5 mg via ORAL
  Filled 2017-06-23: qty 1

## 2017-06-23 MED ORDER — HYDROXYZINE HCL 25 MG PO TABS
25.0000 mg | ORAL_TABLET | Freq: Three times a day (TID) | ORAL | Status: DC | PRN
  Filled 2017-06-23: qty 1
  Filled 2017-06-23: qty 30

## 2017-06-23 MED ORDER — LORAZEPAM 2 MG/ML IJ SOLN: 0.0000 mg | Freq: Four times a day (QID) | INTRAMUSCULAR | Status: DC

## 2017-06-23 MED ORDER — LORAZEPAM 1 MG PO TABS
0.0000 mg | ORAL_TABLET | Freq: Four times a day (QID) | ORAL | Status: DC
  Administered 2017-06-23 – 2017-06-24 (×4): 1 mg via ORAL
  Filled 2017-06-23 (×5): qty 1

## 2017-06-23 MED ORDER — TRAZODONE HCL 50 MG PO TABS
50.0000 mg | ORAL_TABLET | Freq: Every evening | ORAL | Status: DC | PRN
  Administered 2017-06-23: 50 mg via ORAL
  Filled 2017-06-23: qty 30
  Filled 2017-06-23 (×2): qty 1

## 2017-06-23 MED ORDER — MAGNESIUM HYDROXIDE 400 MG/5ML PO SUSP: 30.0000 mL | Freq: Every day | ORAL | Status: DC | PRN

## 2017-06-23 MED ORDER — QUETIAPINE FUMARATE 300 MG PO TABS
300.0000 mg | ORAL_TABLET | Freq: Every day | ORAL | Status: DC
  Administered 2017-06-23 – 2017-06-28 (×6): 300 mg via ORAL
  Filled 2017-06-23 (×9): qty 1

## 2017-06-23 MED ORDER — LORAZEPAM 1 MG PO TABS: 0.0000 mg | ORAL_TABLET | Freq: Two times a day (BID) | ORAL | Status: DC

## 2017-06-23 MED ORDER — METOPROLOL TARTRATE 25 MG PO TABS
12.5000 mg | ORAL_TABLET | Freq: Two times a day (BID) | ORAL | Status: DC
  Administered 2017-06-23: 12.5 mg via ORAL
  Filled 2017-06-23: qty 1

## 2017-06-23 MED ORDER — HALOPERIDOL 5 MG PO TABS: 5.0000 mg | ORAL_TABLET | Freq: Four times a day (QID) | ORAL | Status: DC | PRN

## 2017-06-23 MED ORDER — NICOTINE POLACRILEX 2 MG MT GUM: 2.0000 mg | CHEWING_GUM | OROMUCOSAL | Status: DC | PRN

## 2017-06-23 MED ORDER — VITAMIN B-1 100 MG PO TABS
100.0000 mg | ORAL_TABLET | Freq: Every day | ORAL | Status: DC
  Administered 2017-06-23 – 2017-06-28 (×6): 100 mg via ORAL
  Filled 2017-06-23 (×9): qty 1

## 2017-06-23 MED ORDER — AMLODIPINE BESYLATE 5 MG PO TABS
5.0000 mg | ORAL_TABLET | Freq: Every day | ORAL | Status: DC
  Administered 2017-06-24 – 2017-06-25 (×2): 5 mg via ORAL
  Filled 2017-06-23 (×4): qty 1

## 2017-06-23 MED ORDER — ENSURE ENLIVE PO LIQD: 237.0000 mL | Freq: Two times a day (BID) | ORAL | Status: DC

## 2017-06-23 MED ORDER — VITAMIN B-1 100 MG PO TABS
100.0000 mg | ORAL_TABLET | Freq: Every day | ORAL | Status: DC
  Administered 2017-06-23: 100 mg via ORAL
  Filled 2017-06-23: qty 1

## 2017-06-23 MED ORDER — ACETAMINOPHEN 325 MG PO TABS: 650.0000 mg | ORAL_TABLET | Freq: Four times a day (QID) | ORAL | Status: DC | PRN

## 2017-06-23 NOTE — ED Notes (Signed)
[facility] will call back for report 

## 2017-06-23 NOTE — [facility] Counselor (Signed)
Pt has been accepted to [facility]. Check with day-shift AC on bed assignment and time of arrival.    Vertell Novak, MS, [facility], [facility] Triage Specialist [phone]

## 2017-06-23 NOTE — ED Notes (Signed)
Pt can transport approx 1000 per [facility]

## 2017-06-23 NOTE — ED Notes (Addendum)
Pt ambulatory w/o difficulty to [facility] with [place], belongings given to driver.

## 2017-06-23 NOTE — ED Provider Notes (Signed)
[facility] DEPT Provider Note   CSN: [id number] Arrival date & time: 06/22/17  2159     History   Chief Complaint Chief Complaint  Patient presents with  . Medical Clearance  . Detox    HPI [name] is a 47 y.o. male with past medical history of substance abuse, anxiety, hypertension, CVA, hep C presenting with request for lab medical clearance to return to behavioral health for treatment of cocaine and alcohol abuse.  She was across the street at behavioral health and sent over here for medical clearance.  Plan to return after normal labs.  Patient denies any symptoms of when asked about chest pain he reported a mild chest tightness that he attributes to hard ache and has that frequently no new symptoms.  Last substance use was around 3:30 PM patient used cocaine and he reports having 12 beers today as well.  Ports that he is otherwise well and denies SI, HI, hallucinations or any other symptoms.  HPI  Past Medical History:  Diagnosis Date  . Anxiety   . EtOH dependence ([place])    QUIT AUG 8.  Marland Kitchen GERD without esophagitis   . Hepatitis C carrier ([place])   . Hypercholesteremia   . Hypertension   . Polysubstance dependence ([facility])    COCAINE-LAST DOSE AUG 8  . Stroke [facility])    has some ST memory deficits from stroke.    Patient Active Problem List   Diagnosis Date Noted  . Normocytic anemia   . Lipoma of right forearm 12/29/2016  . Chronic hepatitis C virus infection ([place]) G1a 12/28/2016  . GERD (gastroesophageal reflux disease) 12/10/2016  . Transaminitis 12/10/2016  . Colon cancer screening 12/10/2016  . Substance induced mood disorder ([place]) 11/05/2016  . Alcohol dependence with alcohol-induced mood disorder [facility])     Past Surgical History:  Procedure Laterality Date  . ANEURYSM COILING     AGE 56 x2  . APPENDECTOMY    . BIOPSY  01/05/2017   Procedure: BIOPSY;  Surgeon: Danie Binder, MD;  Location: [facility] ENDO SUITE;  Service:  Endoscopy;;  Duodenal and Gastric  . COLONOSCOPY WITH PROPOFOL N/A 01/05/2017   Procedure: COLONOSCOPY WITH PROPOFOL;  Surgeon: Danie Binder, MD;  Location: [facility] ENDO SUITE;  Service: Endoscopy;  Laterality: N/A;  10:00am  . ESOPHAGOGASTRODUODENOSCOPY (EGD) WITH PROPOFOL N/A 01/05/2017   Procedure: ESOPHAGOGASTRODUODENOSCOPY (EGD) WITH PROPOFOL;  Surgeon: Danie Binder, MD;  Location: [facility] ENDO SUITE;  Service: Endoscopy;  Laterality: N/A;  . LAPAROSCOPIC APPENDECTOMY    . MASS EXCISION Right 01/13/2017   Procedure: EXCISION LIPOMA RIGHT ARM;  Surgeon: Virl Cagey, MD;  Location: [facility] ORS;  Service: General;  Laterality: Right;        Home Medications    Prior to Admission medications   Medication Sig Start Date End Date Taking? Authorizing Provider  amLODipine (NORVASC) 5 MG tablet Take 5 mg by mouth daily.   Yes [provider]  metoprolol tartrate (LOPRESSOR) 25 MG tablet Take 12.5 mg by mouth 2 (two) times daily.   Yes [provider]  QUEtiapine (SEROQUEL) 300 MG tablet Take 300 mg by mouth daily.   Yes [provider]  docusate sodium (COLACE) 100 MG capsule Take 1 capsule (100 mg total) by mouth 2 (two) times daily. Patient not taking: Reported on 06/23/2017 01/13/17 01/13/18  Virl Cagey, MD  Ledipasvir-Sofosbuvir (HARVONI) 90-400 MG TABS Take 1 tablet daily by mouth. At the same time every day  Patient not taking: Reported on 06/23/2017 02/10/17   Carlis Stable, NP  lisinopril (PRINIVIL,ZESTRIL) 10 MG tablet Take 1 tablet (10 mg total) by mouth daily. For high blood pressure Patient not taking: Reported on 06/23/2017 11/11/16   Lindell Spar I, NP  mirtazapine (REMERON) 7.5 MG tablet Take 1 tablet (7.5 mg total) by mouth at bedtime. For sleep Patient not taking: Reported on 06/23/2017 11/10/16   Lindell Spar I, NP  oxyCODONE (ROXICODONE) 5 MG immediate release tablet Take 1 tablet (5 mg total) by mouth every 4 (four) hours as needed for severe  pain. Patient not taking: Reported on 06/23/2017 01/13/17   Virl Cagey, MD  pantoprazole (PROTONIX) 40 MG tablet Take 1 tablet (40 mg total) by mouth daily. For acid reflux Patient not taking: Reported on 06/23/2017 11/11/16   Encarnacion Slates, NP    Family History Family History  Problem Relation Age of Onset  . Stomach cancer Mother   . Bone cancer Sister 33  . Colon cancer Brother 50  . Colon polyps Neg Hx     Social History Social History   Tobacco Use  . Smoking status: Current Every Day Smoker    Packs/day: 0.25    Years: 25.00    Pack years: 6.25    Types: Cigarettes  . Smokeless tobacco: Never Used  Substance Use Topics  . Alcohol use: No    Comment: quitting-none since 11/04/16  . Drug use: Yes    Types: "Crack" cocaine    Comment: last positive 10/2016. ssta used last 2 months ago.     Allergies   Patient has no known allergies.   Review of Systems Review of Systems  Constitutional: Negative for chills, diaphoresis, fatigue, fever and unexpected weight change.  HENT: Negative for congestion.   Eyes: Negative for photophobia, redness and visual disturbance.  Respiratory: Positive for chest tightness. Negative for cough, choking, shortness of breath, wheezing and stridor.   Cardiovascular: Negative for chest pain, palpitations and leg swelling.  Gastrointestinal: Negative for abdominal distention, abdominal pain, diarrhea, nausea and vomiting.  Genitourinary: Negative for difficulty urinating, dysuria, flank pain and hematuria.  Musculoskeletal: Negative for myalgias, neck pain and neck stiffness.  Skin: Negative for color change, pallor, rash and wound.  Neurological: Negative for dizziness, tremors, syncope, facial asymmetry, speech difficulty, weakness, light-headedness, numbness and headaches.     Physical Exam Updated Vital Signs BP (!) 147/102 (BP Location: Left Arm)   Pulse 79   Temp 98.3 F (36.8 C) (Oral)   Resp 18   Ht 5\' 4"  (1.626 m)    SpO2 99%   BMI 27.29 kg/m   Physical Exam  Constitutional: He is oriented to person, place, and time. He appears well-developed and well-nourished. No distress.  Patient is afebrile, nontoxic appearing sitting comfortably in chair in no acute distress.  He is alert and oriented and responding appropriately.  He does not appear clinically intoxicated.  HENT:  Head: Normocephalic and atraumatic.  Eyes: Conjunctivae and EOM are normal. Right eye exhibits no discharge. Left eye exhibits no discharge.  Neck: Normal range of motion. Neck supple.  Cardiovascular: Normal rate, regular rhythm, normal heart sounds and intact distal pulses.  No murmur heard. Pulmonary/Chest: Effort normal and breath sounds normal. No stridor. No respiratory distress. He has no wheezes. He has no rales. He exhibits no tenderness.  Abdominal: He exhibits no distension.  Musculoskeletal: Normal range of motion. He exhibits no edema, tenderness or deformity.  Neurological: He is alert  and oriented to person, place, and time. He exhibits normal muscle tone.  Skin: Skin is warm and dry. No rash noted. He is not diaphoretic. No erythema. No pallor.  Psychiatric: He has a normal mood and affect.  Nursing note and vitals reviewed.    ED Treatments / Results  Labs (all labs ordered are listed, but only abnormal results are displayed) Labs Reviewed  COMPREHENSIVE METABOLIC PANEL - Abnormal; Notable for the following components:      Result Value   Chloride 100 (*)    Glucose, Bld 102 (*)    Total Protein 9.3 (*)    All other components within normal limits  ETHANOL - Abnormal; Notable for the following components:   Alcohol, Ethyl (B) 80 (*)    All other components within normal limits  CBC  RAPID URINE DRUG SCREEN, HOSP PERFORMED  URINALYSIS, ROUTINE W REFLEX MICROSCOPIC  I-STAT TROPONIN, ED    EKG EKG Interpretation  Date/Time:  Wednesday June 23 2017 02:14:33 EDT Ventricular Rate:  81 PR Interval:     QRS Duration: 73 QT Interval:  364 QTC Calculation: 423 R Axis:   9 Text Interpretation:  Sinus rhythm ST elevation, consider inferior injury Baseline wander No significant change since last tracing 31 Dec 2016 Confirmed by Rolland Porter [phone]) on 06/23/2017 2:59:26 AM   Radiology No results found.  Procedures Procedures (including critical care time)  Medications Ordered in ED Medications - No data to display   Initial Impression / Assessment and Plan / ED Course  I have reviewed the triage vital signs and the nursing notes.  Pertinent labs & imaging results that were available during my care of the patient were reviewed by me and considered in my medical decision making (see chart for details).    Patient reports history of hypertension normally compliant with medications but missed his dose since yesterday.  Patient had no physical complaints initially and was here from behavioral health with request of medical clearance in order to obtain outpatient treatment for substance abuse.  On review of system he reported a chest tightness that was mild and nothing new for him.  Ordered EKG and troponin given cocaine use. ekg unchanged from prior and negative troponin.  Labs unremarkable and exam reassuring. Positive for cocaine and ETOH 80 Patient denies any SI, HI, hallucinations.  He states that he otherwise is feeling well.  Spoke to behavioral health who recommend observation and reevaluation in the morning for inpatient treatment.  No beds are available at behavioral health but anticipated discharge is in the morning will allow patient to receive inpatient treatment tomorrow. Transferred patient care at end of shift to Domenic Moras pending [facility] re-eval in the am and disposition.  Final Clinical Impressions(s) / ED Diagnoses   Final diagnoses:  Polysubstance abuse [facility])    ED Discharge Orders    None       Dossie Der 06/23/17 0710    Rolland Porter,  MD 06/23/17 [phone]

## 2017-06-23 NOTE — BH Assessment (Signed)
Pt to be admitted to [facility] pending morning discharges.  AM shift will provide room number.

## 2017-06-23 NOTE — Progress Notes (Signed)
[name] is a 47 year old male pt admitted on voluntary basis. On admission, he spoke about how he has been drinking on a daily basis and also using cocaine a couple times a week. He spoke about how his wife left him a couple months ago and his drinking became progressively worse and reports that he does not know why she left him. He denies any SI on admission and is able to contract for safety on the unit. He reports that he has not been taking his medications like he is supposed to. He reports that last alcohol drink was yesterday and displays minimal withdrawal symptoms on admission and reports some anxiety and diaphoresis. Rutherford reports that he is unsure of where he will go once he is discharged. Tao was oriented to the unit and safety maintained.

## 2017-06-23 NOTE — ED Notes (Signed)
Eating breakfast, Bed will not be available until 1200 per Tom CSW

## 2017-06-23 NOTE — BH Assessment (Signed)
[facility] Assessment Progress Note  Per Patriciaann Clan PA, this pt requires psychiatric hospitalization at this time.  Leonia Reader, RN, [facility] has assigned pt to [facility] Rm 306-1; [facility] will be ready to receive pt at 12:00.  Pt has signed Voluntary Admission and Consent for Treatment, as well as Consent to Release Information to pt's wife, and signed forms have been faxed to [facility].  Pt's nurse, Narda Rutherford, has been notified, and agrees to send original paperwork along with pt via [facility], and to call report to [phone].  Jalene Mullet, [facility] Coordinator [phone]

## 2017-06-23 NOTE — ED Notes (Signed)
NAD, eatting lunch, is aware that he will transfer shortly.  Wife will bring his clothes to [facility] later.  Pt denies si/hi/avh at this time.

## 2017-06-23 NOTE — Patient Outreach (Signed)
ED Peer Support Specialist Patient Intake (Complete at intake & 30-60 Day Follow-up)  Name: [name]  MRN: [id number]  Age: 47 y.o.   Date of Admission: 06/23/2017  Intake: Initial Comments:      Primary Reason Admitted: The pt came in with his wife after using about 40 dollars worth of crack cocaine and drinking about 6 beers.  The pt stated he used the crack 30 minutes prior to arrival and last had a drink around 3pm.  He typically drinks about 12 beers daily.  His crack use depends on how much money he has and he uses about twice a week.  His longest period of sobriety was for 4 years while in jail.  He was in jail due to drug possession.  He described his primary stressor as being separated from his wife.  He is currently living alone.     Lab values: Alcohol/ETOH: Positive Positive UDS? Yes Amphetamines: No Barbiturates: No Benzodiazepines: No Cocaine: Yes Opiates: No Cannabinoids: No  Demographic information: Gender: Male Ethnicity: African American Marital Status: Married Insurance Status: Private Insurance(BCBS) Ecologist (Work Neurosurgeon, Physicist, medical, etc.: No Lives with: Partner/Spouse Living situation: House/Apartment  Reported Patient History: Patient reported health conditions: None Patient aware of HIV and hepatitis status: No  In past year, has patient visited ED for any reason? Yes  Number of ED visits:    Reason(s) for visit: Same situation  In past year, has patient been hospitalized for any reason? No  Number of hospitalizations:    Reason(s) for hospitalization:    In past year, has patient been arrested? No  Number of arrests:    Reason(s) for arrest:    In past year, has patient been incarcerated? No  Number of incarcerations:    Reason(s) for incarceration:    In past year, has patient received medication-assisted treatment? No  In past year, patient received the following treatments: Residential  treatment (non-hospital)  In past year, has patient received any harm reduction services? No  Did this include any of the following?    In past year, has patient received care from a mental health provider for diagnosis other than SUD? No  In past year, is this first time patient has overdosed? No  Number of past overdoses:    In past year, is this first time patient has been hospitalized for an overdose? No  Number of hospitalizations for overdose(s):    Is patient currently receiving treatment for a mental health diagnosis? No  Patient reports experiencing difficulty participating in SUD treatment: No    Most important reason(s) for this difficulty?    Has patient received prior services for treatment? No  In past, patient has received services from following agencies:    Plan of Care:  Suggested follow up at these agencies/treatment centers: [facility]), [facility]  Other information:  CPSS met with Pt and talked with Pt about how he is doing and to monitor services. CPSS used Motivational interviewing. CPSS addressed the concerns and issues that Pt stated he was having. CPSS addressed the options that Pt maybe able to use. CPSS discussed what Path[facility] to Recovery resources that will be beneficial for Pt. CPSS discussed the importance of Pt allowing the services to help in better the quality of his life. CPSS left contact information so that Pt can communicate after his [facility] visit.    Aaron Edelman Giselle Brutus, CPSS  06/23/2017 11:10 AM

## 2017-06-23 NOTE — ED Notes (Signed)
[facility] contacted for tansport, pt up to the bathroom

## 2017-06-23 NOTE — ED Notes (Signed)
Patient denies SI/HI/AVH. Patient reports that he needs help with alcohol and drug addiction. Plan of care discussed. Encouragement and support provided and safety maintain. Q 15 min safety check in place.

## 2017-06-23 NOTE — ED Notes (Signed)
Report called to Eulogio Bear RN pt can transport at 1330.

## 2017-06-23 NOTE — Tx Team (Signed)
Initial Treatment Plan 06/23/2017 3:46 PM [name] HRC:[id number]    PATIENT STRESSORS: Marital or family conflict Medication change or noncompliance Substance abuse   PATIENT STRENGTHS: Ability for insight Average or above average intelligence Capable of independent living General fund of knowledge Motivation for treatment/growth   PATIENT IDENTIFIED PROBLEMS: Depression Substance Abuse Suicidal thoughts "Help with substance abuse and deal with the loss of the relationship with my wife"                     DISCHARGE CRITERIA:  Ability to meet basic life and health needs Improved stabilization in mood, thinking, and/or behavior Verbal commitment to aftercare and medication compliance Withdrawal symptoms are absent or subacute and managed without 24-hour nursing intervention  PRELIMINARY DISCHARGE PLAN: Attend aftercare/continuing care group  PATIENT/FAMILY INVOLVEMENT: This treatment plan has been presented to and reviewed with the patient, [name], and/or family member, .  The patient and family have been given the opportunity to ask questions and make suggestions.  [place], [place], [place] 06/23/2017, 3:46 PM

## 2017-06-23 NOTE — BHH Counselor (Addendum)
Clinician discussed pt's disposition (inpatient treatment) with Janett Billow, [place]. Another TTS consult is not needed.   Vertell Novak, MS, [facility], [facility] Triage Specialist [phone]

## 2017-06-23 NOTE — ED Notes (Signed)
[facility] is here to transporgt

## 2017-06-24 DIAGNOSIS — F17210 Nicotine dependence, cigarettes, uncomplicated: Secondary | ICD-10-CM

## 2017-06-24 DIAGNOSIS — F419 Anxiety disorder, unspecified: Secondary | ICD-10-CM

## 2017-06-24 DIAGNOSIS — F332 Major depressive disorder, recurrent severe without psychotic features: Principal | ICD-10-CM

## 2017-06-24 DIAGNOSIS — F1421 Cocaine dependence, in remission: Secondary | ICD-10-CM

## 2017-06-24 DIAGNOSIS — R4587 Impulsiveness: Secondary | ICD-10-CM

## 2017-06-24 DIAGNOSIS — F1410 Cocaine abuse, uncomplicated: Secondary | ICD-10-CM

## 2017-06-24 DIAGNOSIS — R45851 Suicidal ideations: Secondary | ICD-10-CM

## 2017-06-24 DIAGNOSIS — F1020 Alcohol dependence, uncomplicated: Secondary | ICD-10-CM

## 2017-06-24 DIAGNOSIS — Z630 Problems in relationship with spouse or partner: Secondary | ICD-10-CM

## 2017-06-24 MED ORDER — BOOST / RESOURCE BREEZE PO LIQD CUSTOM
1.0000 | Freq: Two times a day (BID) | ORAL | Status: DC
  Administered 2017-06-24 – 2017-06-27 (×5): 1 via ORAL
  Filled 2017-06-24 (×13): qty 1

## 2017-06-24 MED ORDER — ADULT MULTIVITAMIN W/MINERALS CH
1.0000 | ORAL_TABLET | Freq: Every day | ORAL | Status: DC
  Administered 2017-06-24 – 2017-06-28 (×5): 1 via ORAL
  Filled 2017-06-24 (×7): qty 1

## 2017-06-24 MED ORDER — FLUOXETINE HCL 10 MG PO CAPS
10.0000 mg | ORAL_CAPSULE | Freq: Every day | ORAL | Status: DC
  Administered 2017-06-24 – 2017-06-25 (×2): 10 mg via ORAL
  Filled 2017-06-24 (×4): qty 1

## 2017-06-24 NOTE — H&P (Signed)
Psychiatric Admission Assessment Adult  Patient Identification: [name] MRN:  [id number] Date of Evaluation:  06/24/2017 Chief Complaint:  mdd Principal Diagnosis: <principal problem not specified> Diagnosis:   Patient Active Problem List   Diagnosis Date Noted  . MDD (major depressive disorder), recurrent severe, without psychosis ([place]) [F33.2] 06/23/2017  . Normocytic anemia [D64.9]   . Lipoma of right forearm [D17.21] 12/29/2016  . Chronic hepatitis C virus infection ([place]) G1a [B18.2] 12/28/2016  . GERD (gastroesophageal reflux disease) [K21.9] 12/10/2016  . Transaminitis [R74.0] 12/10/2016  . Colon cancer screening [Z12.11] 12/10/2016  . Substance induced mood disorder ([place]) [F19.94] 11/05/2016  . Alcohol dependence with alcohol-induced mood disorder ([place]) [F10.24]    History of Present Illness: Patient is seen and examined.  Patient is a 47 year old male with a past psychiatric history significant for major depression, alcohol use disorder, cocaine use disorder, and chronic hepatitis C which is been treated with Harvoni.  The patient stated he had increased recent psychosocial stressors.  He stated he talked to his wife yesterday, and she told him that she was leaving him.  This was unexpected.  The patient stated had some suggestion of problems.  He works at nights, and had not been intimate with her in some time.  After she told him that she was leaving him, he decompensated, and started drinking alcohol and using cocaine.  He stated he had been relatively sober until the last week.  He stated he had had 3-4 months of sobriety.  He stated that the alcohol started approximately 1 week ago.  He admitted to cocaine use.  He admitted to helplessness, hopelessness and worthlessness.  He stated he felt lost.  He was admitted to the hospital for evaluation and stabilization. Associated Signs/Symptoms: Depression Symptoms:  depressed mood, anhedonia, psychomotor  retardation, fatigue, feelings of worthlessness/guilt, (Hypo) Manic Symptoms:  Impulsivity, Anxiety Symptoms:  Excessive Worry, Psychotic Symptoms:  None PTSD Symptoms: Negative Total Time spent with patient: 45 minutes  Past Psychiatric History: Patient admitted that he had had a previous hospitalization at [facility] in August 2018 for substance use as well as depression.  He later went to a substance abuse facility.  He has seen a counselor in the past, but it been a long time.  He is not currently seeing a psychiatrist.  His medications are being prescribed by his family doctor.  Is the patient at risk to self? Yes.    Has the patient been a risk to self in the past 6 months? No.  Has the patient been a risk to self within the distant past? Yes.    Is the patient a risk to others? No.  Has the patient been a risk to others in the past 6 months? No.  Has the patient been a risk to others within the distant past? No.   Prior Inpatient Therapy:   Prior Outpatient Therapy:    Alcohol Screening: 1. How often do you have a drink containing alcohol?: 4 or more times a week 2. How many drinks containing alcohol do you have on a typical day when you are drinking?: 10 or more 3. How often do you have six or more drinks on one occasion?: Daily or almost daily AUDIT-C Score: 12 4. How often during the last year have you found that you were not able to stop drinking once you had started?: Monthly 5. How often during the last year have you failed to do what was normally expected from you becasue of  drinking?: Monthly 6. How often during the last year have you needed a first drink in the morning to get yourself going after a heavy drinking session?: Daily or almost daily 7. How often during the last year have you had a feeling of guilt of remorse after drinking?: Weekly 8. How often during the last year have you been unable to remember what happened the night before because you had been drinking?:  Daily or almost daily 9. Have you or someone else been injured as a result of your drinking?: Yes, but not in the last year 10. Has a relative or friend or a doctor or another health worker been concerned about your drinking or suggested you cut down?: Yes, during the last year Alcohol Use Disorder Identification Test Final Score (AUDIT): 33 Intervention/Follow-up: Alcohol Education Substance Abuse History in the last 12 months:  Yes.   Consequences of Substance Abuse: Medical Consequences:  Hepatitis C Family Consequences:  His wife is decided to leave him Previous Psychotropic Medications: Yes  Psychological Evaluations: Yes  Past Medical History:  Past Medical History:  Diagnosis Date  . Anxiety   . EtOH dependence ([place])    QUIT AUG 8.  Marland Kitchen GERD without esophagitis   . Hepatitis C carrier ([place])   . Hypercholesteremia   . Hypertension   . Polysubstance dependence ([facility])    COCAINE-LAST DOSE AUG 8  . Stroke [facility])    has some ST memory deficits from stroke.    Past Surgical History:  Procedure Laterality Date  . ANEURYSM COILING     AGE 45 x2  . APPENDECTOMY    . BIOPSY  01/05/2017   Procedure: BIOPSY;  Surgeon: Danie Binder, MD;  Location: [facility];  Service: Endoscopy;;  Duodenal and Gastric  . COLONOSCOPY WITH PROPOFOL N/A 01/05/2017   Procedure: COLONOSCOPY WITH PROPOFOL;  Surgeon: Danie Binder, MD;  Location: [facility];  Service: Endoscopy;  Laterality: N/A;  10:00am  . ESOPHAGOGASTRODUODENOSCOPY (EGD) WITH PROPOFOL N/A 01/05/2017   Procedure: ESOPHAGOGASTRODUODENOSCOPY (EGD) WITH PROPOFOL;  Surgeon: Danie Binder, MD;  Location: [facility];  Service: Endoscopy;  Laterality: N/A;  . LAPAROSCOPIC APPENDECTOMY    . MASS EXCISION Right 01/13/2017   Procedure: EXCISION LIPOMA RIGHT ARM;  Surgeon: Virl Cagey, MD;  Location: [facility] ORS;  Service: General;  Laterality: Right;   Family History:  Family History  Problem Relation Age of Onset  . Stomach cancer  Mother   . Bone cancer Sister 85  . Colon cancer Brother 28  . Colon polyps Neg Hx    Family Psychiatric  History: Uncle has mental health problems, brother has substance abuse issues Tobacco Screening: Have you used any form of tobacco in the last 30 days? (Cigarettes, Smokeless Tobacco, Cigars, and/or Pipes): Yes Tobacco use, Select all that apply: 5 or more cigarettes per day Are you interested in Tobacco Cessation Medications?: Yes, will notify MD for an order Counseled patient on smoking cessation including recognizing danger situations, developing coping skills and basic information about quitting provided: Refused/Declined practical counseling Social History:  Social History   Substance and Sexual Activity  Alcohol Use Yes   Comment: daily usage past 2 months     Social History   Substance and Sexual Activity  Drug Use Yes  . Types: "Crack" cocaine   Comment: last positive 10/2016. ssta used last 2 months ago.    Additional Social History:  Allergies:  No Known Allergies Lab Results:  Results for orders placed or performed during the hospital encounter of 06/22/17 (from the past 48 hour(s))  Comprehensive metabolic panel     Status: Abnormal   Collection Time: 06/22/17 11:01 PM  Result Value Ref Range   Sodium 140 135 - 145 mmol/L   Potassium 3.9 3.5 - 5.1 mmol/L   Chloride 100 (L) 101 - 111 mmol/L   CO2 27 22 - 32 mmol/L   Glucose, Bld 102 (H) 65 - 99 mg/dL   BUN 7 6 - 20 mg/dL   Creatinine, Ser 0.89 0.61 - 1.24 mg/dL   Calcium 9.4 8.9 - 10.3 mg/dL   Total Protein 9.3 (H) 6.5 - 8.1 g/dL   Albumin 4.4 3.5 - 5.0 g/dL   AST 23 15 - 41 U/L   ALT 17 17 - 63 U/L   Alkaline Phosphatase 94 38 - 126 U/L   Total Bilirubin 1.0 0.3 - 1.2 mg/dL   GFR calc non Af Amer >60 >60 mL/min   GFR calc Af Amer >60 >60 mL/min    Comment: (NOTE) The eGFR has been calculated using the CKD EPI equation. This calculation has not been validated in all  clinical situations. eGFR's persistently <60 mL/min signify possible Chronic Kidney Disease.    Anion gap 13 5 - 15    Comment: Performed at [facility], [place] [address] [place], [place] [address]  Ethanol     Status: Abnormal   Collection Time: 06/22/17 11:01 PM  Result Value Ref Range   Alcohol, Ethyl (B) 80 (H) <10 mg/dL    Comment:        LOWEST DETECTABLE LIMIT FOR SERUM ALCOHOL IS 10 mg/dL FOR MEDICAL PURPOSES ONLY Performed at [place] [address], [place], [place] [address]   cbc     Status: None   Collection Time: 06/22/17 11:01 PM  Result Value Ref Range   WBC 8.5 4.0 - 10.5 K/uL   RBC 5.09 4.22 - 5.81 MIL/uL   Hemoglobin 15.3 13.0 - 17.0 g/dL   HCT 44.8 39.0 - 52.0 %   MCV 88.0 78.0 - 100.0 fL   MCH 30.1 26.0 - 34.0 pg   MCHC 34.2 30.0 - 36.0 g/dL   RDW 14.6 11.5 - 15.5 %   Platelets 240 150 - 400 K/uL    Comment: Performed at [facility], [place] [address] [place], [place] [address]  Urinalysis, Routine w reflex microscopic     Status: Abnormal   Collection Time: 06/23/17 12:41 AM  Result Value Ref Range   Color, Urine YELLOW YELLOW   APPearance CLEAR CLEAR   Specific Gravity, Urine 1.014 1.005 - 1.030   pH 5.0 5.0 - 8.0   Glucose, UA NEGATIVE NEGATIVE mg/dL   Hgb urine dipstick NEGATIVE NEGATIVE   Bilirubin Urine NEGATIVE NEGATIVE   Ketones, ur NEGATIVE NEGATIVE mg/dL   Protein, ur NEGATIVE NEGATIVE mg/dL   Nitrite NEGATIVE NEGATIVE   Leukocytes, UA NEGATIVE NEGATIVE   RBC / HPF 0-5 0 - 5 RBC/hpf   WBC, UA 0-5 0 - 5 WBC/hpf   Bacteria, UA NONE SEEN NONE SEEN   Squamous Epithelial / LPF 0-5 (A) NONE SEEN   Mucus PRESENT    Hyaline Casts, UA PRESENT     Comment: Performed at [facility], [place] [address], [place], [place] [address]  I-Stat Troponin, ED (not at [facility])     Status: None  Collection Time: 06/23/17  2:22 AM  Result Value Ref Range   Troponin i, poc 0.00 0.00 -  0.08 ng/mL   Comment 3            Comment: Due to the release kinetics of cTnI, a negative result within the first hours of the onset of symptoms does not rule out myocardial infarction with certainty. If myocardial infarction is still suspected, repeat the test at appropriate intervals.     Blood Alcohol level:  Lab Results  Component Value Date   ETH 80 (H) 06/22/2017   ETH <5 43/32/9518    Metabolic Disorder Labs:  Lab Results  Component Value Date   HGBA1C 5.6 11/05/2016   MPG 114.02 11/05/2016   Lab Results  Component Value Date   PROLACTIN 9.2 11/05/2016   Lab Results  Component Value Date   CHOL 123 11/05/2016   TRIG 200 (H) 11/05/2016   HDL 46 11/05/2016   CHOLHDL 2.7 11/05/2016   VLDL 40 11/05/2016   LDLCALC 37 11/05/2016    Current Medications: Current Facility-Administered Medications  Medication Dose Route Frequency Provider Last Rate Last Dose  . acetaminophen (TYLENOL) tablet 650 mg  650 mg Oral Q6H PRN Ethelene Hal, NP      . alum & mag hydroxide-simeth (MAALOX/MYLANTA) 200-200-20 MG/5ML suspension 30 mL  30 mL Oral Q4H PRN Ethelene Hal, NP      . amLODipine (NORVASC) tablet 5 mg  5 mg Oral Daily Ethelene Hal, NP   5 mg at 06/24/17 8416  . haloperidol (HALDOL) tablet 5 mg  5 mg Oral Q6H PRN Ethelene Hal, NP       And  . benztropine (COGENTIN) tablet 1 mg  1 mg Oral Q6H PRN Ethelene Hal, NP      . feeding supplement (BOOST / RESOURCE BREEZE) liquid 1 Container  1 Container Oral BID BM Cobos, Fernando A, MD      . hydrOXYzine (ATARAX/VISTARIL) tablet 25 mg  25 mg Oral TID PRN Ethelene Hal, NP      . LORazepam (ATIVAN) injection 0-4 mg  0-4 mg Intravenous Q6H Ethelene Hal, NP       Or  . LORazepam (ATIVAN) tablet 0-4 mg  0-4 mg Oral Q6H Ethelene Hal, NP   1 mg at 06/23/17 2153  . [START ON 06/25/2017] LORazepam (ATIVAN) injection 0-4 mg  0-4 mg Intravenous Q12H Ethelene Hal,  NP       Or  . Derrill Memo ON 06/25/2017] LORazepam (ATIVAN) tablet 0-4 mg  0-4 mg Oral Q12H Ethelene Hal, NP      . magnesium hydroxide (MILK OF MAGNESIA) suspension 30 mL  30 mL Oral Daily PRN Ethelene Hal, NP      . metoprolol tartrate (LOPRESSOR) tablet 12.5 mg  12.5 mg Oral BID Cobos, Myer Peer, MD   12.5 mg at 06/24/17 0811  . multivitamin with minerals tablet 1 tablet  1 tablet Oral Daily Cobos, Fernando A, MD      . nicotine polacrilex (NICORETTE) gum 2 mg  2 mg Oral PRN Sharma Covert, MD      . QUEtiapine (SEROQUEL) tablet 300 mg  300 mg Oral Daily Ethelene Hal, NP   300 mg at 06/24/17 6063  . thiamine (VITAMIN B-1) tablet 100 mg  100 mg Oral Daily Ethelene Hal, NP   100 mg at 06/24/17 0160   Or  . thiamine (B-1) injection 100 mg  100 mg Intravenous Daily Ethelene Hal, NP      . traZODone (DESYREL) tablet 50 mg  50 mg Oral QHS PRN Ethelene Hal, NP   50 mg at 06/23/17 2153   PTA Medications: Medications Prior to Admission  Medication Sig Dispense Refill Last Dose  . amLODipine (NORVASC) 5 MG tablet Take 5 mg by mouth daily.   06/21/2017 at Unknown time  . docusate sodium (COLACE) 100 MG capsule Take 1 capsule (100 mg total) by mouth 2 (two) times daily. (Patient not taking: Reported on 06/23/2017) 60 capsule 2 Not Taking at Unknown time  . Ledipasvir-Sofosbuvir (HARVONI) 90-400 MG TABS Take 1 tablet daily by mouth. At the same time every day (Patient not taking: Reported on 06/23/2017) 30 tablet 2 Not Taking at Unknown time  . lisinopril (PRINIVIL,ZESTRIL) 10 MG tablet Take 1 tablet (10 mg total) by mouth daily. For high blood pressure (Patient not taking: Reported on 06/23/2017) 30 tablet 0 Not Taking at Unknown time  . metoprolol tartrate (LOPRESSOR) 25 MG tablet Take 12.5 mg by mouth 2 (two) times daily.   06/21/2017 at Unknown time  . mirtazapine (REMERON) 7.5 MG tablet Take 1 tablet (7.5 mg total) by mouth at bedtime. For sleep  (Patient not taking: Reported on 06/23/2017) 30 tablet 0 Not Taking at Unknown time  . oxyCODONE (ROXICODONE) 5 MG immediate release tablet Take 1 tablet (5 mg total) by mouth every 4 (four) hours as needed for severe pain. (Patient not taking: Reported on 06/23/2017) 20 tablet 0 Not Taking at Unknown time  . pantoprazole (PROTONIX) 40 MG tablet Take 1 tablet (40 mg total) by mouth daily. For acid reflux (Patient not taking: Reported on 06/23/2017) 30 tablet 0 Not Taking at Unknown time  . QUEtiapine (SEROQUEL) 300 MG tablet Take 300 mg by mouth daily.   06/21/2017 at Unknown time    Musculoskeletal: Strength & Muscle Tone: within normal limits Gait & Station: normal Patient leans: N/A  Psychiatric Specialty Exam: Physical Exam  Nursing note and vitals reviewed. Constitutional: He is oriented to person, place, and time. He appears well-developed and well-nourished.  HENT:  Head: Normocephalic and atraumatic.  Respiratory: Effort normal.  Musculoskeletal: Normal range of motion.  Neurological: He is alert and oriented to person, place, and time.    ROS  Blood pressure 126/86, pulse 82, temperature 97.7 F (36.5 C), temperature source Oral, resp. rate 16, height '5\' 3"'$  (1.6 m), weight 60.8 kg (134 lb).Body mass index is 23.74 kg/m.  General Appearance: Disheveled  Eye Contact:  Minimal  Speech:  Slow  Volume:  Decreased  Mood:  Depressed  Affect:  Congruent  Thought Process:  Coherent  Orientation:  Full (Time, Place, and Person)  Thought Content:  Negative  Suicidal Thoughts:  Yes.  without intent/plan  Homicidal Thoughts:  No  Memory:  Immediate;   Fair  Judgement:  Impaired  Insight:  Lacking  Psychomotor Activity:  Decreased  Concentration:  Concentration: Fair  Recall:  [employer] of Knowledge:  Fair  Language:  Good  Akathisia:  No  Handed:  Right  AIMS (if indicated):     Assets:  Communication Skills Desire for Improvement Physical Health Resilience Talents/Skills   ADL's:  Intact  Cognition:  WNL  Sleep:  Number of Hours: 6    Treatment Plan Summary: Daily contact with patient to assess and evaluate symptoms and progress in treatment, Medication management and Plan Patient is seen and examined.  Patient is a 47 year old  male with a past psychiatric history significant for major depression, alcohol use disorder, cocaine use disorder.  He was admitted because of suicidal ideation.  He will be placed on alcohol withdrawal protocol.  He will be monitored for any withdrawal symptoms.  He will be continued on his metoprolol.  He will also receive folate acid and thiamine.  He will have available for him trazodone at at bedtime.  He is significantly depressed, and I am going to start him on fluoxetine 10 mg p.o. daily.  We will titrate this up during the course of the hospitalization.  He has expressed suicidal ideation but contracts for safety.  We will continue to monitor this.  He does have a history of hepatitis C and was treated hopefully successfully with Harvoni.  Hopefully the above interventions will assist him in recovery as well as sobriety.  Observation Level/Precautions:  15 minute checks  Laboratory:  CBC Chemistry Profile UDS UA  Psychotherapy:    Medications:    Consultations:    Discharge Concerns:    Estimated LOS:  Other:     Physician Treatment Plan for Primary Diagnosis: <principal problem not specified> Long Term Goal(s): Improvement in symptoms so as ready for discharge  Short Term Goals: Ability to identify changes in lifestyle to reduce recurrence of condition will improve, Ability to verbalize feelings will improve, Ability to disclose and discuss suicidal ideas, Ability to demonstrate self-control will improve, Ability to identify and develop effective coping behaviors will improve, Ability to maintain clinical measurements within normal limits will improve and Compliance with prescribed medications will improve  Physician  Treatment Plan for Secondary Diagnosis: Active Problems:   MDD (major depressive disorder), recurrent severe, without psychosis ([place])  Long Term Goal(s): Improvement in symptoms so as ready for discharge  Short Term Goals: Ability to identify changes in lifestyle to reduce recurrence of condition will improve, Ability to verbalize feelings will improve, Ability to disclose and discuss suicidal ideas, Ability to demonstrate self-control will improve, Ability to identify and develop effective coping behaviors will improve, Ability to maintain clinical measurements within normal limits will improve, Compliance with prescribed medications will improve and Ability to identify triggers associated with substance abuse/mental health issues will improve  I certify that inpatient services furnished can reasonably be expected to improve the patient's condition.    Sharma Covert, MD 3/28/201911:34 AM

## 2017-06-24 NOTE — BHH Counselor (Signed)
Adult Comprehensive Assessment  Patient ID: [name], male   DOB: Jul 15, 1970, 47 y.o.   MRN: [id number] Information Source: Information source: Patient  Current Stressors:  Educational / Learning stressors: 11th grade. 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 Reports they are no longer together.  Financial / Lack of resources (include bankruptcy): income from employment; private insurance Housing / Lack of housing: Lives alone in a house in [place], [place].  Physical health (include injuries & life threatening diseases): Patient denies  Social relationships: Patient denies; reports he does not have any friends or social relationships currently Substance abuse: Patient reports he relapsed this month, after being clean since December 2018. Patient reports smoking $700 worth of cocaine and drinking a case of alcohol daily.  Bereavement / Loss: Patient denies   Living/Environment/Situation:  Living Arrangements: Alone Living conditions (as described by patient or guardian): "Okay"; Currently living alone, reports his wife and children left a month ago.  How long has patient lived in current situation?: 24 years  What is atmosphere in current home: Comfortable  Family History:  Marital status: Separated How long: 1 month Number of Years Married: 44 What types of issues is patient dealing with in the relationship?: "My drinking and drug use."  Additional relationship information: Patient's wife left him 1 month ago.  Are you sexually active?: No What is your sexual orientation?: heterosexual Has your sexual activity been affected by drugs, alcohol, medication, or emotional stress?: No  Does patient have children?: Yes How many children?: 3 How is patient's relationship with their children?: 24yo twin boys  and 62yo daughter. Patient reports one son is in college, one son moved away  and his daughter currently lives with his wife. Reports having a good relationship with his children.   Childhood History:  By whom was/is the patient raised?: Both parents Additional childhood history information: "I had the best childhood I could ever ask for." Mother and father were married and are still married today Description of patient's relationship with caregiver when they were a child: close to both parents Patient's description of current relationship with people who raised him/her: close to both parents How were you disciplined when you got in trouble as a child/adolescent?: n/a  Does patient have siblings?: Yes Number of Siblings: 35 Description of patient's current relationship with siblings: youngest of 31 siblings: 8 boys and 3 girls. "we are all very close." no substance abuse issues.  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 but my job performance is not the same." What is the longest time patient has a held a job?: see above  Where was the patient employed at that time?: see above.  Has patient ever been in the [employer]?: No  Has patient ever served in combat?: No Did You Receive Any Psychiatric  Treatment/Services While in the [place]?: No Are There Guns or Other Weapons in [place]?: Yes Types of Guns/Weapons: gun (pt had gun in mouth prior to admission).  Are These Weapons Safely Secured?: Yes (daughter and wife took gun which is now secured and pt does not have access. )  Financial Resources:   Financial resources: Income from [employer] Does patient have a representative payee or guardian?: No  Alcohol/Substance Abuse:   What has been your use of drugs/alcohol within the last 12 months?: Patient reports smoking $700 worth of cocaine daily. He also reports drinking a case of alcohol a day.  If attempted suicide, did drugs/alcohol play a role in this?: No. Patient denies any suicidal ideations prior to this hospitalization.  Alcohol/Substance Abuse Treatment Hx: Denies past history, Past Tx, Inpatient If yes, describe treatment: "I [facility] I was 1 for detox and substance abuse treatment."  Has alcohol/substance abuse ever caused legal problems?: No  Social Support System:   Patient's Community Support System: [employer] System: some coworker that are friends; few friends in community. biggest support is wife and kids Type of faith/religion: christian How does patient's faith help to cope with current illness?: prayer;   Leisure/Recreation:   Leisure and Hobbies: hunting; fishing; doing anything outdoors  Strengths/Needs:   What things does the patient do well?: motivated to stop using drugs and drinking; goal oriented and future focused; motivated to attend residential treatment "I want to do this right."  In what areas does patient struggle / problems for patient: coping with depression and SI thoughts; chronic crack cocaine and alcohol abuse-cravings.   Discharge Plan:   Does patient have access to transportation?: Yes Will patient be returning to same living situation after discharge?: No Plan for living  situation after discharge: Pt is requesting residential treatment. [place] If no, would patient like referral for services when discharged?: Yes (What county?) [employer], likely) Does patient have financial barriers related to discharge medications?: No  Summary/Recommendations:   Summary and Recommendations (to be completed by the evaluator): Amjad is a 47 year old male who is diagnosed with Major Depressive disorder, recurrent episode, moderate, Alcohol use disorder, severe and Cocaine use disorder, severe. He presented to the hospital seeking treatment for depressive symtpoms and substance abuse issues. During the assessment, Kavari was very pleasant and cooperative with providing information. Thedford reports his wife of 23 years, decided to leave him due to his drug use. Garek reports after he and his wife seperated a month ago, he relapsed after being clean since December 2018. Shalon reports he uses over $700 worth of cocaine and a case a alcohol on a daily basis and is motivated for residential treatment at discharge. Mat can benefit from crisis stabilization, medication management, therapuetic milieu, and referral services.   Marylee Floras. 06/24/2017

## 2017-06-24 NOTE — Progress Notes (Signed)
  DATA ACTION RESPONSE  Objective- Pt. is visible in the room, seen sleeping for majority of shift.Presents with a depressed affect and mood. Pt appears to be isolative to room; only going out for meals. Did not attend wrap-up group.   Subjective- Denies having any SI/HI/AVH/Pain at this time. Is cooperative and remains safe on the unit.  1:1 interaction in private to establish rapport. Encouragement, education, & support given from staff.  PRN trazodone requested and will re-eval accordingly.   Safety maintained with Q 15 checks. Continue with POC.

## 2017-06-24 NOTE — BHH Suicide Risk Assessment (Signed)
[place] INPATIENT:  Family/Significant Other Suicide Prevention Education  Suicide Prevention Education:  Education Completed; Jonovan Boedecker, wife [phone]) has been identified by the patient as the family member/significant other with whom the patient will be residing, and identified as the person(s) who will aid the patient in the event of a mental health crisis (suicidal ideations/suicide attempt).  With written consent from the patient, the family member/significant other has been provided the following suicide prevention education, prior to the and/or following the discharge of the patient.  The suicide prevention education provided includes the following:  Suicide risk factors  Suicide prevention and interventions  National Suicide Hotline telephone number  [facility] assessment telephone number  [facility] Emergency Assistance [place] and/or Residential Mobile Crisis Unit telephone number  Request made of family/significant other to:  Remove weapons (e.g., guns, rifles, knives), all items previously/currently identified as safety concern.    Remove drugs/medications (over-the-counter, prescriptions, illicit drugs), all items previously/currently identified as a safety concern.  The family member/significant other verbalizes understanding of the suicide prevention education information provided.  The family member/significant other agrees to remove the items of safety concern listed above.  Marylee Floras 06/24/2017, 2:15 PM

## 2017-06-24 NOTE — BHH Group Notes (Signed)

## 2017-06-24 NOTE — Progress Notes (Signed)
Patient was invited but did not attend the 4:00 PM Crisis Management Psychoeducational Group led by RNs.

## 2017-06-24 NOTE — BHH Suicide Risk Assessment (Signed)
[facility] Admission Suicide Risk Assessment   Nursing information obtained from:    Demographic factors:    Current Mental Status:    Loss Factors:    Historical Factors:    Risk Reduction Factors:     Total Time spent with patient: 45 minutes Principal Problem: <principal problem not specified> Diagnosis:   Patient Active Problem List   Diagnosis Date Noted  . MDD (major depressive disorder), recurrent severe, without psychosis ([place]) [F33.2] 06/23/2017  . Normocytic anemia [D64.9]   . Lipoma of right forearm [D17.21] 12/29/2016  . Chronic hepatitis C virus infection ([place]) G1a [B18.2] 12/28/2016  . GERD (gastroesophageal reflux disease) [K21.9] 12/10/2016  . Transaminitis [R74.0] 12/10/2016  . Colon cancer screening [Z12.11] 12/10/2016  . Substance induced mood disorder ([place]) [F19.94] 11/05/2016  . Alcohol dependence with alcohol-induced mood disorder ([place]) [F10.24]    Subjective Data: Patient is seen and examined.  Patient is a 47 year old male with suicidal ideation.  He has a history of depression as well as alcohol and cocaine use disorders.  His wife informed him that she was leaving him, and he relapsed on alcohol and cocaine.  He developed suicidal ideation.  He was admitted for evaluation and stabilization.  Continued Clinical Symptoms:  Alcohol Use Disorder Identification Test Final Score (AUDIT): 33 The "Alcohol Use Disorders Identification Test", Guidelines for Use in Primary Care, Second Edition.  World Pharmacologist [facility]). Score between 0-7:  no or low risk or alcohol related problems. Score between 8-15:  moderate risk of alcohol related problems. Score between 16-19:  high risk of alcohol related problems. Score 20 or above:  warrants further diagnostic evaluation for alcohol dependence and treatment.   CLINICAL FACTORS:   Depression:   Anhedonia Hopelessness Impulsivity Insomnia   Musculoskeletal: Strength & Muscle Tone: within normal limits Gait &  Station: normal Patient leans: N/A  Psychiatric Specialty Exam: Physical Exam  Nursing note and vitals reviewed. Constitutional: He is oriented to person, place, and time. He appears well-developed and well-nourished.  HENT:  Head: Normocephalic and atraumatic.  Respiratory: Effort normal.  Musculoskeletal: Normal range of motion.  Neurological: He is alert and oriented to person, place, and time.    ROS  Blood pressure 126/86, pulse 82, temperature 97.7 F (36.5 C), temperature source Oral, resp. rate 16, height 5\' 3"  (1.6 m), weight 60.8 kg (134 lb).Body mass index is 23.74 kg/m.  General Appearance: Disheveled  Eye Contact:  Minimal  Speech:  Slow  Volume:  Decreased  Mood:  Depressed  Affect:  Congruent  Thought Process:  Coherent  Orientation:  Full (Time, Place, and Person)  Thought Content:  Logical  Suicidal Thoughts:  Yes.  without intent/plan  Homicidal Thoughts:  No  Memory:  Immediate;   Fair  Judgement:  Intact  Insight:  Lacking  Psychomotor Activity:  Decreased  Concentration:  Concentration: Fair  Recall:  [place] of Knowledge:  Good  Language:  Good  Akathisia:  No  Handed:  Right  AIMS (if indicated):     Assets:  Communication Skills Desire for Improvement Resilience Transportation  ADL's:  Intact  Cognition:  WNL  Sleep:  Number of Hours: 6      COGNITIVE FEATURES THAT CONTRIBUTE TO RISK:  None    SUICIDE RISK:   Mild:  Suicidal ideation of limited frequency, intensity, duration, and specificity.  There are no identifiable plans, no associated intent, mild dysphoria and related symptoms, good self-control (both objective and subjective assessment), few other risk factors,  and identifiable protective factors, including available and accessible social support.  PLAN OF CARE: Patient is seen and examined.  Patient's 47 year old male with the above-stated past psychiatric history who was admitted for depression, substance use disorders as  well as suicidal ideation.  He will be placed on alcohol withdrawal protocol.  He will be monitored for suicidal ideation.  He will go under 15-minute checks.  He will be integrated into the milieu of the unit.  He will receive psychosocial interventions.  He will be continued on his medications, and fluoxetine will be added for depression.  I certify that inpatient services furnished can reasonably be expected to improve the patient's condition.   Sharma Covert, MD 06/24/2017, 11:49 AM

## 2017-06-24 NOTE — Progress Notes (Signed)
DAR NOTE: Pt present with flat affect and depressed mood in the unit. Pt has been isolating himself and has been in bed most of the time. Pt denies physical pain, took all his meds as scheduled. As per self inventory, pt had a fair night sleep, poor appetite, low energy, and poor concentration. Pt rate depression at 6, hopeless ness at 5, and anxiety at 7. Pt's safety ensured with 15 minute and environmental checks. Pt currently denies SI/HI and A/V hallucinations. Pt verbally agrees to seek staff if SI/HI or A/VH occurs and to consult with staff before acting on these thoughts. Will continue POC.

## 2017-06-24 NOTE — Progress Notes (Signed)
NUTRITION ASSESSMENT  Pt identified as at risk on the Malnutrition Screen Tool  INTERVENTION: 1. Educated patient on the importance of nutrition and encouraged intake of food and beverages. 2. Discussed weight goals. 3. Supplements:  - will d/c Ensure Enlive per pt preference. - will order Boost Breeze BID, each supplement provides 250 kcal and 9 grams of protein - will order daily multivitamin with minerals.   NUTRITION DIAGNOSIS: Unintentional weight loss related to sub-optimal intake as evidenced by pt report.   Goal: Pt to meet >/= 90% of their estimated nutrition needs.  Monitor:  PO intake  Assessment:  Pt admitted for polysubstance abuse and depression. He reports drinking alcohol daily and using cocaine several days a week. He reports that these increased when his wife left him a few months ago. Per chart review, he has lost 16 lbs (10% body weight) in the past 5 months. This is significant for time frame.   Will adjust supplement order as outlined above. Continue to encourage PO intakes of meals, supplements, and snacks.    47 y.o. male  Height: Ht Readings from Last 1 Encounters:  06/23/17 5\' 3"  (1.6 m)    Weight: Wt Readings from Last 1 Encounters:  06/23/17 134 lb (60.8 kg)    Weight Hx: Wt Readings from Last 10 Encounters:  06/23/17 134 lb (60.8 kg)  01/05/17 159 lb (72.1 kg)  01/04/17 159 lb (72.1 kg)  12/31/16 159 lb 6.4 oz (72.3 kg)  12/29/16 158 lb (71.7 kg)  12/22/16 158 lb (71.7 kg)  12/10/16 148 lb (67.1 kg)  11/04/16 126 lb (57.2 kg)    BMI:  Body mass index is 23.74 kg/m. Pt meets criteria for normal weight based on current BMI.  Estimated Nutritional Needs: Kcal: 25-30 kcal/kg Protein: > 1 gram protein/kg Fluid: 1 ml/kcal  Diet Order: Diet regular Room service appropriate? No; Fluid consistency: Thin Pt is also offered choice of unit snacks mid-morning and mid-afternoon.  Pt is eating as desired.   Lab results and medications  reviewed.      Jarome Matin, MS, RD, LDN, [facility] Inpatient Clinical Dietitian Pager # [phone] After hours/weekend pager # [phone]

## 2017-06-25 MED ORDER — LORAZEPAM 1 MG PO TABS: 1.0000 mg | ORAL_TABLET | Freq: Four times a day (QID) | ORAL | Status: DC | PRN

## 2017-06-25 MED ORDER — LOPERAMIDE HCL 2 MG PO CAPS: 2.0000 mg | ORAL_CAPSULE | ORAL | Status: DC | PRN

## 2017-06-25 MED ORDER — AMLODIPINE BESYLATE 10 MG PO TABS
10.0000 mg | ORAL_TABLET | Freq: Every day | ORAL | Status: DC
  Administered 2017-06-26 – 2017-06-28 (×3): 10 mg via ORAL
  Filled 2017-06-25 (×2): qty 1
  Filled 2017-06-25: qty 30
  Filled 2017-06-25 (×2): qty 1

## 2017-06-25 MED ORDER — ONDANSETRON 4 MG PO TBDP: 4.0000 mg | ORAL_TABLET | Freq: Four times a day (QID) | ORAL | Status: DC | PRN

## 2017-06-25 MED ORDER — FLUOXETINE HCL 20 MG PO CAPS
20.0000 mg | ORAL_CAPSULE | Freq: Every day | ORAL | Status: DC
  Administered 2017-06-26 – 2017-06-28 (×3): 20 mg via ORAL
  Filled 2017-06-25 (×2): qty 1
  Filled 2017-06-25: qty 30
  Filled 2017-06-25 (×2): qty 1

## 2017-06-25 NOTE — Progress Notes (Signed)
Patient ID: [name], male   DOB: 1970/06/24, 47 y.o.   MRN: [id number]  Pt currently presents with an appropriate affect and depressed behavior. Pt remains in bed tonight, chooses nto to attend night group. Pt states "I am just trying to get my head right." Pt reports good sleep without any current sleep aid. Presents with signs and symptoms of withdrawal including fatigue and malaise.    Pt provided with withdrawal medications per providers orders. Pt's labs and vitals were monitored throughout the night. Pt given a 1:1 about emotional and mental status. Pt supported and encouraged to express concerns and questions. Pt educated on medications. Encouraged pt to attend group tonight and be present in the milieu.   Pt's safety ensured with 15 minute and environmental checks. Pt currently denies SI/HI and A/V hallucinations. Pt verbally agrees to seek staff if SI/HI or A/VH occurs and to consult with staff before acting on any harmful thoughts. Will continue POC.

## 2017-06-25 NOTE — Progress Notes (Signed)
DAR NOTE: Pt present with calm  affect and pleasant mood in the unit. Pt has been isolating himself and has been bed most of the time. Pt has been encourage to stay up more today and be able to participate in groups and activities going on in the unit. Pt stated he is feel much better today and looking forward to going to groups. Pt denies physical pain, took all his meds as scheduled. As per self inventory, pt had a fair night sleep, poor appetite, low energy, and poor concentration. Pt rate depression at 3, hopeless ness at 2, and anxiety at 2. Pt's safety ensured with 15 minute and environmental checks. Pt currently denies SI/HI and A/V hallucinations. Pt verbally agrees to seek staff if SI/HI or A/VH occurs and to consult with staff before acting on these thoughts. Will continue POC.

## 2017-06-25 NOTE — Progress Notes (Signed)
[facility] MD Progress Note  06/25/2017 1:23 PM [name]  MRN:  [id number] Subjective: Patient seen and examined.  Patient is a 47 year old male with the below stated past psychiatric history seen in follow-up.  He continues to slowly improve.  He is having mild withdrawal symptoms, but overall is improved on the benzodiazepine taper.  He said he felt like his mood was improved to a degree.  He has communicated with his wife, but there have not been any additional issues with regard to any kind of reconciliation.  He is okay with the fact that they are still breaking up.  He is interested in getting into a 30-day rehabilitation program, and social work is working with him on that.  He denied any side effects to his current medications.  His blood pressure continues to be mildly elevated.  His liver function enzymes are normal.  He continues to slowly progress. Principal Problem: Major depression, recurrent, alcohol use disorder, cocaine use disorder, hypertension, gastroesophageal reflux disease, history of chronic hepatitis C treated with [facility] Diagnosis:   Patient Active Problem List   Diagnosis Date Noted  . Cocaine use disorder ([place]) [F14.10]   . MDD (major depressive disorder), recurrent severe, without psychosis ([place]) [F33.2] 06/23/2017  . Normocytic anemia [D64.9]   . Lipoma of right forearm [D17.21] 12/29/2016  . Chronic hepatitis C virus infection ([place]) G1a [B18.2] 12/28/2016  . GERD (gastroesophageal reflux disease) [K21.9] 12/10/2016  . Transaminitis [R74.0] 12/10/2016  . Colon cancer screening [Z12.11] 12/10/2016  . Substance induced mood disorder ([place]) [F19.94] 11/05/2016  . Alcohol use disorder, severe, dependence ([place]) [F10.20]    Total Time spent with patient: 20 minutes  Past Psychiatric History: See admission H&P  Past Medical History:  Past Medical History:  Diagnosis Date  . Anxiety   . EtOH dependence ([facility])    QUIT AUG 8.  Marland Kitchen GERD without esophagitis   . Hepatitis C  carrier ([place])   . Hypercholesteremia   . Hypertension   . Polysubstance dependence (H[facility])    COCAINE-LAST DOSE AUG 8  . Stroke [facility])    has some ST memory deficits from stroke.    Past Surgical History:  Procedure Laterality Date  . ANEURYSM COILING     AGE 65 x2  . APPENDECTOMY    . BIOPSY  01/05/2017   Procedure: BIOPSY;  Surgeon: Danie Binder, MD;  Location: [facility] ENDO SUITE;  Service: Endoscopy;;  Duodenal and Gastric  . COLONOSCOPY WITH PROPOFOL N/A 01/05/2017   Procedure: COLONOSCOPY WITH PROPOFOL;  Surgeon: Danie Binder, MD;  Location: [facility] END[facility] SU[facility];  Service: Endoscopy;  Laterality: N/A;  10:00am  . ESOPHAGOGASTRODUODENOSCOPY (EGD) WITH PROPOFOL N/A 01/05/2017   Procedure: ESOPHAGOGASTRODUODENOSCOPY (EGD) WITH PROPOFOL;  Surgeon: Danie Binder, MD;  Location: [facility] END[facility] SUITE;  Service: Endoscopy;  Laterality: N/A;  . LAPAROSCOPIC APPENDECTOMY    . MASS EXCISION Right 01/13/2017   Procedure: EXCISION LIPOMA RIGHT ARM;  Surgeon: Virl Cagey, MD;  Location: [facility] ORS;  Service: General;  Laterality: Right;   Family History:  Family History  Problem Relation Age of Onset  . Stomach cancer Mother   . Bone cancer Sister 35  . Colon cancer Brother 75  . Colon polyps Neg Hx    Family Psychiatric  History: See admission H&P Social History:  Social History   Substance and Sexual Activity  Alcohol Use Yes   Comment: daily usage past 2 months     Social History   Substance and Sexual  Activity  Drug Use Yes  . Types: "Crack" cocaine   Comment: last positive 10/2016. ssta used last 2 months ago.    Social History   Socioeconomic History  . Marital status: Married    Spouse name: Not on file  . Number of children: Not on file  . Years of education: Not on file  . Highest education level: Not on file  Occupational History  . Not on file  Social Needs  . Financial resource strain: Not on file  . Food insecurity:    Worry: Not on file    Inability: Not on file  .  Transportation needs:    Medical: Not on file    Non-medical: Not on file  Tobacco Use  . Smoking status: Current Every Day Smoker    Packs/day: 0.25    Years: 25.00    Pack years: 6.25    Types: Cigarettes  . Smokeless tobacco: Never Used  Substance and Sexual Activity  . Alcohol use: Yes    Comment: daily usage past 2 months  . Drug use: Yes    Types: "Crack" cocaine    Comment: last positive 10/2016. ssta used last 2 months ago.  Marland Kitchen Sexual activity: Yes    Birth control/protection: Condom  Lifestyle  . Physical activity:    Days per week: Not on file    Minutes per session: Not on file  . Stress: Not on file  Relationships  . Social connections:    Talks on phone: Not on file    Gets together: Not on file    Attends religious service: Not on file    Active member of club or organization: Not on file    Attends meetings of clubs or organizations: Not on file    Relationship status: Not on file  Other Topics Concern  . Not on file  Social History Narrative   NOW ON SHORT TERM DISABILITY. 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   MARRIED-3 WITH WIFE AND 3 PREVIOUS. YOUNGEST 17, STILL AT HOME, OLDEST IS 31.   Additional Social History:                         Sleep: Fair  Appetite:  Fair  Current Medications: Current Facility-Administered Medications  Medication Dose Route Frequency Provider Last Rate Last Dose  . acetaminophen (TYLENOL) tablet 650 mg  650 mg Oral Q6H PRN Ethelene Hal, NP      . alum & mag hydroxide-simeth (MAALOX/MYLANTA) 200-200-20 MG/5ML suspension 30 mL  30 mL Oral Q4H PRN Ethelene Hal, NP      . Derrill Memo ON 06/26/2017] amLODipine (NORVASC) tablet 10 mg  10 mg Oral Daily Sharma Covert, MD      . haloperidol (HALDOL) tablet 5 mg  5 mg Oral Q6H PRN Ethelene Hal, NP       And  . benztropine (COGENTIN) tablet 1 mg  1 mg Oral Q6H PRN Ethelene Hal, NP      . feeding supplement (BOOST / RESOURCE  BREEZE) liquid 1 Container  1 Container Oral BID BM Cobos, Myer Peer, MD   1 Container at 06/25/17 1000  . FLUoxetine (PROZAC) capsule 10 mg  10 mg Oral Daily Sharma Covert, MD   10 mg at 06/25/17 3664  . hydrOXYzine (ATARAX/VISTARIL) tablet 25 mg  25 mg Oral TID PRN Ethelene Hal, NP      . LORazepam (ATIVAN) injection 0-4  mg  0-4 mg Intravenous Q12H Ethelene Hal, NP       Or  . LORazepam (ATIVAN) tablet 0-4 mg  0-4 mg Oral Q12H Ethelene Hal, NP      . magnesium hydroxide (MILK OF MAGNESIA) suspension 30 mL  30 mL Oral Daily PRN Ethelene Hal, NP      . metoprolol tartrate (LOPRESSOR) tablet 12.5 mg  12.5 mg Oral BID Cobos, Myer Peer, MD   12.5 mg at 06/25/17 0818  . multivitamin with minerals tablet 1 tablet  1 tablet Oral Daily Cobos, Myer Peer, MD   1 tablet at 06/25/17 2353  . nicotine polacrilex (NICORETTE) gum 2 mg  2 mg Oral PRN Sharma Covert, MD      . QUEtiapine (SEROQUEL) tablet 300 mg  300 mg Oral Daily Ethelene Hal, NP   300 mg at 06/25/17 0819  . thiamine (VITAMIN B-1) tablet 100 mg  100 mg Oral Daily Ethelene Hal, NP   100 mg at 06/25/17 6144   Or  . thiamine (B-1) injection 100 mg  100 mg Intravenous Daily Ethelene Hal, NP      . traZODone (DESYREL) tablet 50 mg  50 mg Oral QHS PRN Ethelene Hal, NP   50 mg at 06/23/17 2153    Lab Results: No results found for this or any previous visit (from the past 67 hour(s)).  Blood Alcohol level:  Lab Results  Component Value Date   ETH 80 (H) 06/22/2017   ETH <5 31/54/0086    Metabolic Disorder Labs: Lab Results  Component Value Date   HGBA1C 5.6 11/05/2016   MPG 114.02 11/05/2016   Lab Results  Component Value Date   PROLACTIN 9.2 11/05/2016   Lab Results  Component Value Date   CHOL 123 11/05/2016   TRIG 200 (H) 11/05/2016   HDL 46 11/05/2016   CHOLHDL 2.7 11/05/2016   VLDL 40 11/05/2016   LDLCALC 37 11/05/2016    Physical  Findings: AIMS: Facial and Oral Movements Muscles of Facial Expression: None, normal Lips and Perioral Area: None, normal Jaw: None, normal Tongue: None, normal,Extremity Movements Upper (arms, wrists, hands, fingers): None, normal Lower (legs, knees, ankles, toes): None, normal, Trunk Movements Neck, shoulders, hips: None, normal, Overall Severity Severity of abnormal movements (highest score from questions above): None, normal Incapacitation due to abnormal movements: None, normal Patient's awareness of abnormal movements (rate only patient's report): No Awareness, Dental Status Current problems with teeth and/or dentures?: No Does patient usually wear dentures?: No  CIWA:  CIWA-Ar Total: 3 COWS:     Musculoskeletal: Strength & Muscle Tone: within normal limits Gait & Station: normal Patient leans: N/A  Psychiatric Specialty Exam: Physical Exam  ROS  Blood pressure (!) 135/96, pulse 84, temperature 98 F (36.7 C), temperature source Oral, resp. rate 16, height 5\' 3"  (1.6 m), weight 60.8 kg (134 lb).Body mass index is 23.74 kg/m.  General Appearance: Disheveled  Eye Contact:  Fair  Speech:  Slow  Volume:  Decreased  Mood:  Depressed  Affect:  Congruent  Thought Process:  Coherent  Orientation:  Full (Time, Place, and Person)  Thought Content:  Logical  Suicidal Thoughts:  Yes.  without intent/plan  Homicidal Thoughts:  No  Memory:  Immediate;   Fair  Judgement:  Intact  Insight:  Fair  Psychomotor Activity:  Decreased  Concentration:  Concentration: Fair  Recall:  [employer] of Knowledge:  Fair  Language:  Good  Akathisia:  No  Handed:  Right  AIMS (if indicated):     Assets:  Communication Skills Desire for Improvement Resilience Vocational/Educational  ADL's:  Intact  Cognition:  WNL  Sleep:  Number of Hours: 6.75     Treatment Plan Summary: Daily contact with patient to assess and evaluate symptoms and progress in treatment, Medication management and  Plan Patient is seen and examined.  Patient is a 47 year old male with the above-stated past psychiatric history seen in follow-up.  He is tolerating the benzodiazepine taper, but still has some mildly elevated blood pressures.  His mood is doing slightly better.  I am going to increase his fluoxetine to 20 mg p.o. daily for his mood.  I am also going to increase his amlodipine from 5 mg p.o. daily to 10 mg p.o. daily.  No other changes in his current medications.  He is interested in going to a rehabilitation facility after discharge from the hospital.  Social work is working on that.  Hopefully we will be able to arrange that by early next week.  No other changes.  Sharma Covert, MD 06/25/2017, 1:23 PM

## 2017-06-25 NOTE — Tx Team (Signed)
Interdisciplinary Treatment and Diagnostic Plan Update  06/25/2017 Time of Session: 0830AM [name] MRN: [id number]  Principal Diagnosis: MDD, recurrent, severe, without psychosis  Secondary Diagnoses: Active Problems:   Alcohol use disorder, severe, dependence (HCC)   MDD (major depressive disorder), recurrent severe, without psychosis ([place])   Cocaine use disorder ([place])   Current Medications:  Current Facility-Administered Medications  Medication Dose Route Frequency Provider Last Rate Last Dose  . acetaminophen (TYLENOL) tablet 650 mg  650 mg Oral Q6H PRN Ethelene Hal, NP      . alum & mag hydroxide-simeth (MAALOX/MYLANTA) 200-200-20 MG/5ML suspension 30 mL  30 mL Oral Q4H PRN Ethelene Hal, NP      . amLODipine (NORVASC) tablet 5 mg  5 mg Oral Daily Ethelene Hal, NP   5 mg at 06/25/17 5643  . haloperidol (HALDOL) tablet 5 mg  5 mg Oral Q6H PRN Ethelene Hal, NP       And  . benztropine (COGENTIN) tablet 1 mg  1 mg Oral Q6H PRN Ethelene Hal, NP      . feeding supplement (BOOST / RESOURCE BREEZE) liquid 1 Container  1 Container Oral BID BM Cobos, Myer Peer, MD   1 Container at 06/24/17 1030  . FLUoxetine (PROZAC) capsule 10 mg  10 mg Oral Daily Sharma Covert, MD   10 mg at 06/25/17 3295  . hydrOXYzine (ATARAX/VISTARIL) tablet 25 mg  25 mg Oral TID PRN Ethelene Hal, NP      . LORazepam (ATIVAN) injection 0-4 mg  0-4 mg Intravenous Q12H Ethelene Hal, NP       Or  . LORazepam (ATIVAN) tablet 0-4 mg  0-4 mg Oral Q12H Ethelene Hal, NP      . magnesium hydroxide (MILK OF MAGNESIA) suspension 30 mL  30 mL Oral Daily PRN Ethelene Hal, NP      . metoprolol tartrate (LOPRESSOR) tablet 12.5 mg  12.5 mg Oral BID Cobos, Myer Peer, MD   12.5 mg at 06/25/17 0818  . multivitamin with minerals tablet 1 tablet  1 tablet Oral Daily Cobos, Myer Peer, MD   1 tablet at 06/25/17 1884  . nicotine polacrilex  (NICORETTE) gum 2 mg  2 mg Oral PRN Sharma Covert, MD      . QUEtiapine (SEROQUEL) tablet 300 mg  300 mg Oral Daily Ethelene Hal, NP   300 mg at 06/25/17 0819  . thiamine (VITAMIN B-1) tablet 100 mg  100 mg Oral Daily Ethelene Hal, NP   100 mg at 06/25/17 1660   Or  . thiamine (B-1) injection 100 mg  100 mg Intravenous Daily Ethelene Hal, NP      . traZODone (DESYREL) tablet 50 mg  50 mg Oral QHS PRN Ethelene Hal, NP   50 mg at 06/23/17 2153   PTA Medications: Medications Prior to Admission  Medication Sig Dispense Refill Last Dose  . amLODipine (NORVASC) 5 MG tablet Take 5 mg by mouth daily.   06/21/2017 at Unknown time  . docusate sodium (COLACE) 100 MG capsule Take 1 capsule (100 mg total) by mouth 2 (two) times daily. (Patient not taking: Reported on 06/23/2017) 60 capsule 2 Not Taking at Unknown time  . Ledipasvir-Sofosbuvir (HARVONI) 90-400 MG TABS Take 1 tablet daily by mouth. At the same time every day (Patient not taking: Reported on 06/23/2017) 30 tablet 2 Not Taking at Unknown time  . lisinopril (PRINIVIL,ZESTRIL) 10 MG tablet Take  1 tablet (10 mg total) by mouth daily. For high blood pressure (Patient not taking: Reported on 06/23/2017) 30 tablet 0 Not Taking at Unknown time  . metoprolol tartrate (LOPRESSOR) 25 MG tablet Take 12.5 mg by mouth 2 (two) times daily.   06/21/2017 at Unknown time  . mirtazapine (REMERON) 7.5 MG tablet Take 1 tablet (7.5 mg total) by mouth at bedtime. For sleep (Patient not taking: Reported on 06/23/2017) 30 tablet 0 Not Taking at Unknown time  . oxyCODONE (ROXICODONE) 5 MG immediate release tablet Take 1 tablet (5 mg total) by mouth every 4 (four) hours as needed for severe pain. (Patient not taking: Reported on 06/23/2017) 20 tablet 0 Not Taking at Unknown time  . pantoprazole (PROTONIX) 40 MG tablet Take 1 tablet (40 mg total) by mouth daily. For acid reflux (Patient not taking: Reported on 06/23/2017) 30 tablet 0 Not  Taking at Unknown time  . QUEtiapine (SEROQUEL) 300 MG tablet Take 300 mg by mouth daily.   06/21/2017 at Unknown time    Patient Stressors: Marital or family conflict Medication change or noncompliance Substance abuse  Patient Strengths: Ability for insight Average or above average intelligence Capable of independent living [employer] of knowledge Motivation for treatment/growth  Treatment Modalities: Medication Management, Group therapy, Case management,  1 to 1 session with clinician, Psychoeducation, Recreational therapy.   Physician Treatment Plan for Primary Diagnosis:  MDD, recurrent, severe, without psychosis Long Term Goal(s): Improvement in symptoms so as ready for discharge Improvement in symptoms so as ready for discharge   Short Term Goals: Ability to identify changes in lifestyle to reduce recurrence of condition will improve Ability to verbalize feelings will improve Ability to disclose and discuss suicidal ideas Ability to demonstrate self-control will improve Ability to identify and develop effective coping behaviors will improve Ability to maintain clinical measurements within normal limits will improve Compliance with prescribed medications will improve Ability to identify changes in lifestyle to reduce recurrence of condition will improve Ability to verbalize feelings will improve Ability to disclose and discuss suicidal ideas Ability to demonstrate self-control will improve Ability to identify and develop effective coping behaviors will improve Ability to maintain clinical measurements within normal limits will improve Compliance with prescribed medications will improve Ability to identify triggers associated with substance abuse/mental health issues will improve  Medication Management: Evaluate patient's response, side effects, and tolerance of medication regimen.  Therapeutic Interventions: 1 to 1 sessions, Unit Group sessions and Medication  administration.  Evaluation of Outcomes: Progressing  Physician Treatment Plan for Secondary Diagnosis: Active Problems:   Alcohol use disorder, severe, dependence (HCC)   MDD (major depressive disorder), recurrent severe, without psychosis ([place])   Cocaine use disorder ([place])  Long Term Goal(s): Improvement in symptoms so as ready for discharge Improvement in symptoms so as ready for discharge   Short Term Goals: Ability to identify changes in lifestyle to reduce recurrence of condition will improve Ability to verbalize feelings will improve Ability to disclose and discuss suicidal ideas Ability to demonstrate self-control will improve Ability to identify and develop effective coping behaviors will improve Ability to maintain clinical measurements within normal limits will improve Compliance with prescribed medications will improve Ability to identify changes in lifestyle to reduce recurrence of condition will improve Ability to verbalize feelings will improve Ability to disclose and discuss suicidal ideas Ability to demonstrate self-control will improve Ability to identify and develop effective coping behaviors will improve Ability to maintain clinical measurements within normal limits will improve Compliance with  prescribed medications will improve Ability to identify triggers associated with substance abuse/mental health issues will improve     Medication Management: Evaluate patient's response, side effects, and tolerance of medication regimen.  Therapeutic Interventions: 1 to 1 sessions, Unit Group sessions and Medication administration.  Evaluation of Outcomes: Progressing   RN Treatment Plan for Primary Diagnosis:  MDD, recurrent, severe, without psychosis Long Term Goal(s): Knowledge of disease and therapeutic regimen to maintain health will improve  Short Term Goals: Ability to remain free from injury will improve, Ability to disclose and discuss suicidal ideas and  Ability to identify and develop effective coping behaviors will improve  Medication Management: RN will administer medications as ordered by provider, will assess and evaluate patient's response and provide education to patient for prescribed medication. RN will report any adverse and/or side effects to prescribing provider.  Therapeutic Interventions: 1 on 1 counseling sessions, Psychoeducation, Medication administration, Evaluate responses to treatment, Monitor vital signs and CBGs as ordered, Perform/monitor CIWA, COWS, AIMS and Fall Risk screenings as ordered, Perform wound care treatments as ordered.  Evaluation of Outcomes: Progressing   LCSW Treatment Plan for Primary Diagnosis:  MDD, recurrent, severe, without psychosis Long Term Goal(s): Safe transition to appropriate next level of care at discharge, Engage patient in therapeutic group addressing interpersonal concerns.  Short Term Goals: Engage patient in aftercare planning with referrals and resources, Facilitate patient progression through stages of change regarding substance use diagnoses and concerns and Identify triggers associated with mental health/substance abuse issues  Therapeutic Interventions: Assess for all discharge needs, 1 to 1 time with Social worker, Explore available resources and support systems, Assess for adequacy in community support network, Educate family and significant other(s) on suicide prevention, Complete Psychosocial Assessment, Interpersonal group therapy.  Evaluation of Outcomes: Progressing   Progress in Treatment: Attending groups: Yes. Participating in groups: Yes. Taking medication as prescribed: Yes. Toleration medication: Yes. Family/Significant other contact made: Yes, individual(s) contacted:  pt's wife Patient understands diagnosis: Yes. Discussing patient identified problems/goals with staff: Yes. Medical problems stabilized or resolved: Yes. Denies suicidal/homicidal ideation:  Yes. Issues/concerns per patient self-inventory: Yes. Other: n/a  New problem(s) identified: No, Describe:  n/a  New Short Term/Long Term Goal(s): detox, medication management for mood stabilization; elimination of SI thoughts; development of comprehensive mental wellness/sobriety plan.   Patient Goal: "to get sober."   Discharge Plan or Barriers: CSW assessing for appropriate referrals. Pt has been referred to [facility]. [place] pamphlet and AA/NA information provided for additional community support as well.   Reason for Continuation of Hospitalization: Anxiety Depression Medication stabilization Suicidal ideation Withdrawal symptoms  Estimated Length of Stay: Monday, 06/28/17  Attendees: Patient: [name]  06/25/2017 9:04 AM  Physician: Dr. Parke Poisson MD; Dr.  06/25/2017 9:04 AM  Nursing: Estill Bamberg RN; Opal Sidles RN; Patty RN 06/25/2017 9:04 AM  RN Care Manager:x 06/25/2017 9:04 AM  Social Worker: Press photographer, LCSW 06/25/2017 9:04 AM  Recreational Therapist: x 06/25/2017 9:04 AM  Other: Lindell Spar NP; Darnelle Maffucci Money NP 06/25/2017 9:04 AM  Other:  06/25/2017 9:04 AM  Other: 06/25/2017 9:04 AM    Scribe for Treatment Team: Chester Hill, LCSW 06/25/2017 9:04 AM

## 2017-06-25 NOTE — Progress Notes (Signed)
Pt did not attend wrap-up group   

## 2017-06-25 NOTE — Progress Notes (Signed)
Recreation Therapy Notes  Date: 3.29.19 Time: 9:30 a.m. Location: [address] Dayroom   Group Topic: Stress Management   Goal Area(s) Addresses:  Goal 1.1: To reduce stress  -Patient will report feeling a reduction in stress level  -Patient will identify the importance of stress management  -Patient will participate during stress management group treatment     Intervention: Stress Management   Activity: Meditation- Patients were in a peaceful environment with soft lighting enhancing patients mood. Patients listened to a mindfulness meditation voiceover to help reduce stress levels    Education: Stress Management, Discharge Planning.    Education Outcome: Acknowledges edcuation/In group clarification offered/Needs additional education   Clinical Observations/Feedback:: Patient did not attend     Ranell Patrick, Recreation Therapy Intern   Ranell Patrick 06/25/2017 8:22 AM

## 2017-06-25 NOTE — [facility] Group Notes (Signed)
LCSW Group Therapy Note  06/25/2017 1:15pm  Type of Therapy and Topic:  Group Therapy:  Feelings around Relapse and Recovery  Participation Level:  Did Not Attend--pt invited. Chose to remain in bed.    Description of Group:    Patients in this group will discuss emotions they experience before and after a relapse. They will process how experiencing these feelings, or avoidance of experiencing them, relates to having a relapse. Facilitator will guide patients to explore emotions they have related to recovery. Patients will be encouraged to process which emotions are more powerful. They will be guided to discuss the emotional reaction significant others in their lives may have to their relapse or recovery. Patients will be assisted in exploring ways to respond to the emotions of others without this contributing to a relapse.  Therapeutic Goals: 1. Patient will identify two or more emotions that lead to a relapse for them 2. Patient will identify two emotions that result when they relapse 3. Patient will identify two emotions related to recovery 4. Patient will demonstrate ability to communicate their needs through discussion and/or role plays   Summary of Patient Progress:   x  Therapeutic Modalities:   Cognitive Behavioral Therapy Solution-Focused Therapy Assertiveness Training Relapse Prevention Therapy   Kimber Relic Smart, LCSW 06/25/2017 2:44 PM

## 2017-06-25 NOTE — Progress Notes (Signed)
Patient did not attend the [place].

## 2017-06-26 DIAGNOSIS — R450 Nervousness: Secondary | ICD-10-CM

## 2017-06-26 DIAGNOSIS — Z590 Homelessness: Secondary | ICD-10-CM

## 2017-06-26 NOTE — Progress Notes (Signed)
Adult Psychoeducational Group Note  Date:  06/26/2017 Time:  2:46 PM  Group Topic/Focus:  Goals Group:   The focus of this group is to help patients establish daily goals to achieve during treatment and discuss how the patient can incorporate goal setting into their daily lives to aide in recovery.  Participation Level:  Active  Participation Quality:  Appropriate  Affect:  Appropriate  Cognitive:  Alert  Insight: Appropriate  Engagement in Group:  Engaged  Modes of Intervention:  Discussion  Additional Comments:  Pt attended group today and participated in all discussions.  Pt states that his goal was to get through today.  Joia Doyle R Tammee Thielke 06/26/2017, 2:46 PM

## 2017-06-26 NOTE — Progress Notes (Signed)
Patient ID: [name], male   DOB: [birth year], 47 y.o.   MRN: [id number]  Pt currently presents with a brighter affect and guarded behavior. Pt reports to Probation officer that his day was "better than yesterday." Mainly because his wife came to visit him and he feel he can "work on making our relationship better." Pt denies any cravings or concerns currently. Pt interacts positively with roommate, limited interaction noted with other patients. Pt reports good sleep without any sleep aids.  Pt's labs and vitals were monitored throughout the night. Pt given a 1:1 about emotional and mental status. Pt supported and encouraged to express concerns and questions.   Pt's safety ensured with 15 minute and environmental checks. Pt currently denies SI/HI and A/V hallucinations. Pt verbally agrees to seek staff if SI/HI or A/VH occurs and to consult with staff before acting on any harmful thoughts. Will continue POC.

## 2017-06-26 NOTE — Progress Notes (Addendum)
D. Pt presents with a sad affect and depressed behavior. Pt is appropriate during interactions, but somewhat guarded. Pt reports having a poor appetite, poor concentration and low energy level today. Per pt's self inventory, pt rates his depression, hopelessness and anxiety a 7/7/7, respectively. Pt writes that his most important goal to work on today is "letting go of my wife". Pt currently denies pain, SI/HI and AV hallucinations.A. Labs and vitals monitored. Pt compliant with medications. Pt supported emotionally and encouraged to express concerns and ask questions.   R. Pt remains safe with 15 minute checks. Will continue POC.

## 2017-06-26 NOTE — Progress Notes (Signed)
CSW contacted [employer] regarding pt's referral status. Pt has been accepted to [facility] and has been asked to bring a small out of pocket about being that he has a deductable amount ($100 more if possible) per [place] in admissions. CSW informed pt of above who stated that should not be a problem. He was agreeable to admitting to [facility] on Monday and requested transport. Per Elmyra Ricks, admissions will call CSW on Monday with estimated date of arrival (likely on Monday afternoon). NP and MD notified of above.   Maxie Better, MSW, LCSW Clinical Social Worker 06/26/2017 11:32 AM

## 2017-06-26 NOTE — Progress Notes (Signed)
[facility] MD Progress Note  06/26/2017 1:59 PM [name]  MRN:  [id number]   Subjective:  Patient reports that he is doing good today. He reports only having depression with discussing the situation with his wife, "I been married to her for 24 years." He is worried about discharging and not going to a facility, "I'll be homeless if I am discharged now." He does state that he has enough Arseniy Toomey for a hotel room for a couple of days if needed. He denies any SI/HI/AVH and contracts for safety. He reports his anxiety as just mild today.  Objective: Patient's chart and findings reviewed and discussed with treatment team. Patient presents in his room and is pleasant and cooperative. He has a flat affect, but engages easily. He has been attending group and interacting with peers and staff. CSW confirms that patient will be discharged to [facility] on Monday. Will continue current medications and patient agreed to work on improving coping skills.   Principal Problem: MDD (major depressive disorder), recurrent severe, without psychosis ([place]) Diagnosis:   Patient Active Problem List   Diagnosis Date Noted  . Cocaine use disorder ([place]) [F14.10]   . MDD (major depressive disorder), recurrent severe, without psychosis ([place]) [F33.2] 06/23/2017  . Normocytic anemia [D64.9]   . Lipoma of right forearm [D17.21] 12/29/2016  . Chronic hepatitis C virus infection ([place]) G1a [B18.2] 12/28/2016  . GERD (gastroesophageal reflux disease) [K21.9] 12/10/2016  . Transaminitis [R74.0] 12/10/2016  . Colon cancer screening [Z12.11] 12/10/2016  . Substance induced mood disorder ([place]) [F19.94] 11/05/2016  . Alcohol use disorder, severe, dependence ([place]) [F10.20]    Total Time spent with patient: 15 minutes  Past Psychiatric History: See H&P  Past Medical History:  Past Medical History:  Diagnosis Date  . Anxiety   . EtOH dependence ([place])    QUIT AUG 8.  Marland Kitchen GERD without esophagitis   . Hepatitis C  carrier ([place])   . Hypercholesteremia   . Hypertension   . Polysubstance dependence ([facility])    COCAINE-LAST DOSE AUG 8  . Stroke [facility])    has some ST memory deficits from stroke.    Past Surgical History:  Procedure Laterality Date  . ANEURYSM COILING     AGE 54 x2  . APPENDECTOMY    . BIOPSY  01/05/2017   Procedure: BIOPSY;  Surgeon: Danie Binder, MD;  Location: [facility] ENDO SUITE;  Service: Endoscopy;;  Duodenal and Gastric  . COLONOSCOPY WITH PROPOFOL N/A 01/05/2017   Procedure: COLONOSCOPY WITH PROPOFOL;  Surgeon: Danie Binder, MD;  Location: [facility] ENDO SUITE;  Service: Endoscopy;  Laterality: N/A;  10:00am  . ESOPHAGOGASTRODUODENOSCOPY (EGD) WITH PROPOFOL N/A 01/05/2017   Procedure: ESOPHAGOGASTRODUODENOSCOPY (EGD) WITH PROPOFOL;  Surgeon: Danie Binder, MD;  Location: [facility] ENDO SUITE;  Service: Endoscopy;  Laterality: N/A;  . LAPAROSCOPIC APPENDECTOMY    . MASS EXCISION Right 01/13/2017   Procedure: EXCISION LIPOMA RIGHT ARM;  Surgeon: Virl Cagey, MD;  Location: [facility] ORS;  Service: General;  Laterality: Right;   Family History:  Family History  Problem Relation Age of Onset  . Stomach cancer Mother   . Bone cancer Sister 29  . Colon cancer Brother 53  . Colon polyps Neg Hx    Family Psychiatric  History: See H&P Social History:  Social History   Substance and Sexual Activity  Alcohol Use Yes   Comment: daily usage past 2 months     Social History   Substance and Sexual Activity  Drug Use Yes  . Types: "Crack" cocaine   Comment: last positive 10/2016. ssta used last 2 months ago.    Social History   Socioeconomic History  . Marital status: Married    Spouse name: Not on file  . Number of children: Not on file  . Years of education: Not on file  . Highest education level: Not on file  Occupational History  . Not on file  Social Needs  . Financial resource strain: Not on file  . Food insecurity:    Worry: Not on file    Inability: Not on file  .  Transportation needs:    Medical: Not on file    Non-medical: Not on file  Tobacco Use  . Smoking status: Current Every Day Smoker    Packs/day: 0.25    Years: 25.00    Pack years: 6.25    Types: Cigarettes  . Smokeless tobacco: Never Used  Substance and Sexual Activity  . Alcohol use: Yes    Comment: daily usage past 2 months  . Drug use: Yes    Types: "Crack" cocaine    Comment: last positive 10/2016. ssta used last 2 months ago.  Marland Kitchen Sexual activity: Yes    Birth control/protection: Condom  Lifestyle  . Physical activity:    Days per week: Not on file    Minutes per session: Not on file  . Stress: Not on file  Relationships  . Social connections:    Talks on phone: Not on file    Gets together: Not on file    Attends religious service: Not on file    Active member of club or organization: Not on file    Attends meetings of clubs or organizations: Not on file    Relationship status: Not on file  Other Topics Concern  . Not on file  Social History Narrative   NOW ON SHORT TERM DISABILITY. 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   MARRIED-3 WITH WIFE AND 3 PREVIOUS. YOUNGEST 17, STILL AT HOME, OLDEST IS 31.   Additional Social History:                         Sleep: Good  Appetite:  Fair  Current Medications: Current Facility-Administered Medications  Medication Dose Route Frequency Provider Last Rate Last Dose  . acetaminophen (TYLENOL) tablet 650 mg  650 mg Oral Q6H PRN Ethelene Hal, NP      . alum & mag hydroxide-simeth (MAALOX/MYLANTA) 200-200-20 MG/5ML suspension 30 mL  30 mL Oral Q4H PRN Ethelene Hal, NP      . amLODipine (NORVASC) tablet 10 mg  10 mg Oral Daily Sharma Covert, MD   10 mg at 06/26/17 0747  . feeding supplement (BOOST / RESOURCE BREEZE) liquid 1 Container  1 Container Oral BID BM Cobos, Myer Peer, MD   1 Container at 06/26/17 1139  . FLUoxetine (PROZAC) capsule 20 mg  20 mg Oral Daily Sharma Covert,  MD   20 mg at 06/26/17 0748  . hydrOXYzine (ATARAX/VISTARIL) tablet 25 mg  25 mg Oral TID PRN Ethelene Hal, NP      . loperamide (IMODIUM) capsule 2-4 mg  2-4 mg Oral PRN Lindon Romp A, NP      . LORazepam (ATIVAN) tablet 1 mg  1 mg Oral Q6H PRN Lindon Romp A, NP      . magnesium hydroxide (MILK OF MAGNESIA) suspension 30 mL  30  mL Oral Daily PRN Ethelene Hal, NP      . metoprolol tartrate (LOPRESSOR) tablet 12.5 mg  12.5 mg Oral BID Cobos, Myer Peer, MD   12.5 mg at 06/26/17 0748  . multivitamin with minerals tablet 1 tablet  1 tablet Oral Daily Cobos, Myer Peer, MD   1 tablet at 06/26/17 0748  . nicotine polacrilex (NICORETTE) gum 2 mg  2 mg Oral PRN Sharma Covert, MD      . ondansetron (ZOFRAN-ODT) disintegrating tablet 4 mg  4 mg Oral Q6H PRN Lindon Romp A, NP      . QUEtiapine (SEROQUEL) tablet 300 mg  300 mg Oral Daily Ethelene Hal, NP   300 mg at 06/26/17 0747  . thiamine (VITAMIN B-1) tablet 100 mg  100 mg Oral Daily Ethelene Hal, NP   100 mg at 06/26/17 2505   Or  . thiamine (B-1) injection 100 mg  100 mg Intravenous Daily Ethelene Hal, NP      . traZODone (DESYREL) tablet 50 mg  50 mg Oral QHS PRN Ethelene Hal, NP   50 mg at 06/23/17 2153    Lab Results: No results found for this or any previous visit (from the past 62 hour(s)).  Blood Alcohol level:  Lab Results  Component Value Date   ETH 80 (H) 06/22/2017   ETH <5 39/76/7341    Metabolic Disorder Labs: Lab Results  Component Value Date   HGBA1C 5.6 11/05/2016   MPG 114.02 11/05/2016   Lab Results  Component Value Date   PROLACTIN 9.2 11/05/2016   Lab Results  Component Value Date   CHOL 123 11/05/2016   TRIG 200 (H) 11/05/2016   HDL 46 11/05/2016   CHOLHDL 2.7 11/05/2016   VLDL 40 11/05/2016   LDLCALC 37 11/05/2016    Physical Findings: AIMS: Facial and Oral Movements Muscles of Facial Expression: None, normal Lips and Perioral Area: None,  normal Jaw: None, normal Tongue: None, normal,Extremity Movements Upper (arms, wrists, hands, fingers): None, normal Lower (legs, knees, ankles, toes): None, normal, Trunk Movements Neck, shoulders, hips: None, normal, Overall Severity Severity of abnormal movements (highest score from questions above): None, normal Incapacitation due to abnormal movements: None, normal Patient's awareness of abnormal movements (rate only patient's report): No Awareness, Dental Status Current problems with teeth and/or dentures?: No Does patient usually wear dentures?: No  CIWA:  CIWA-Ar Total: 1 COWS:     Musculoskeletal: Strength & Muscle Tone: within normal limits Gait & Station: normal Patient leans: N/A  Psychiatric Specialty Exam: Physical Exam  Nursing note and vitals reviewed. Constitutional: He is oriented to person, place, and time. He appears well-developed and well-nourished.  Cardiovascular: Normal rate.  Respiratory: Effort normal.  Musculoskeletal: Normal range of motion.  Neurological: He is alert and oriented to person, place, and time.  Skin: Skin is warm.    Review of Systems  Constitutional: Negative.   HENT: Negative.   Eyes: Negative.   Respiratory: Negative.   Cardiovascular: Negative.   Gastrointestinal: Negative.   Genitourinary: Negative.   Musculoskeletal: Negative.   Skin: Negative.   Neurological: Negative.   Endo/Heme/Allergies: Negative.   Psychiatric/Behavioral: Positive for depression. Negative for hallucinations and suicidal ideas. The patient is nervous/anxious.     Blood pressure 139/79, pulse 60, temperature 98.6 F (37 C), temperature source Oral, resp. rate 20, height 5\' 3"  (1.6 m), weight 60.8 kg (134 lb).Body mass index is 23.74 kg/m.  General Appearance: Casual  Eye Contact:  Good  Speech:  Clear and Coherent and Normal Rate  Volume:  Normal  Mood:  Depressed  Affect:  Flat  Thought Process:  Goal Directed and Descriptions of Associations:  Intact  Orientation:  Full (Time, Place, and Person)  Thought Content:  WDL  Suicidal Thoughts:  No  Homicidal Thoughts:  No  Memory:  Immediate;   Good Recent;   Good Remote;   Good  Judgement:  Good  Insight:  Good  Psychomotor Activity:  Normal  Concentration:  Concentration: Good  Recall:  Good  Fund of Knowledge:  Good  Language:  Good  Akathisia:  No  Handed:  Right  AIMS (if indicated):     Assets:  Communication Skills Desire for Improvement Financial Resources/Insurance Physical Health Social Support Transportation  ADL's:  Intact  Cognition:  WNL  Sleep:  Number of Hours: 6.75   Problems Addressed: Alcohol use disorder MDD Severe  Treatment Plan Summary: Daily contact with patient to assess and evaluate symptoms and progress in treatment, Medication management and Plan is to:  -Continue Prozac 20 mg PO Daily for mood stability -Continue Vistaril 25 mg PO TID PRN for anxiety -Continue Seroquel 300 mg PO QHS for mood stability -Continue Ativan Detox Protocol -Continue Trazodone 50 mg PO QHS PRN for insomnia -Encourage group therapy aprticipation  Lewis Shock, FNP 06/26/2017, 1:59 PM

## 2017-06-26 NOTE — [facility] Group Notes (Signed)
[place] Group Notes:  (Nursing)  Date:  06/26/2017  Time:  1:15 PM Type of Therapy:  Nurse Education  Participation Level: Did not attend  Participation Quality:  Did not attend  Affect:  Did not attend  Cognitive:  Did not attend  Insight:  None  Engagement in Group:  None  Modes of Intervention:  Discussion and Education  Summary of Progress/Problems: Patient did not attend group  Waymond Cera 06/26/2017, 1:56 PM

## 2017-06-26 NOTE — BHH Group Notes (Signed)
[place] Group Notes: (Clinical Social Work)   06/26/2017      Type of Therapy:  Group Therapy   Participation Level:  Did Not Attend despite MHT prompting   Selmer Dominion, LCSW 06/26/2017, 1:04 PM

## 2017-06-26 NOTE — Progress Notes (Signed)
[place] Group Notes:  (Nursing/MHT/Case Management/Adjunct)  Date:  06/26/2017  Time:  2100  Type of Therapy:  wrap up group  Participation Level:  Active  Participation Quality:  Appropriate, Attentive and Supportive  Affect:  Flat  Cognitive:  Appropriate  Insight:  Good  Engagement in Group:  Engaged  Modes of Intervention:  Clarification, Education and Support  Summary of Progress/Problems: Pt is looking forward to discharging to [facility] treatment center. Pt reports feeling better after having a visit from his wife. Pt feels it is a good thing he is getting treatment but the situation he was in and the situation which led him here he wishes he could change. Pt did not give details of situation. Pt is grateful for his wife and six grown children.   Winfield Rast S 06/26/2017, 10:16 PM

## 2017-06-27 NOTE — [facility] Group Notes (Signed)
[place] Group Notes:  (Nursing)  Date:  06/27/2017  Time: 1:30 PM Type of Therapy:  Nurse Education  Participation Level:  Did Not Attend  Participation Quality:  did not attend  Affect:  did not attend  Cognitive:  did not attend  Insight:  None  Engagement in Group:  None  Modes of Intervention:  did not attend  Summary of Progress/Problems:  Patient did not attend group  Waymond Cera 06/27/2017, 2:40 PM

## 2017-06-27 NOTE — Progress Notes (Signed)
[facility] MD Progress Note  06/27/2017 2:32 PM [name]  MRN:  [id number]   Subjective:  Patient states that he is doing good today. He is excited about going to [employer]. He denies any SI/HI/AVH and contracts for safety. When asked about anxiety and depression he reports feeling good and states "Not really today." He reports sleeping good, and eating good. He denies any medication side effects.   Objective: Patient's chart and findings reviewed and discussed with treatment team. Patient is found in the day room interacting with peers and staff appropriately. Patient has not had any doses of Ativan so it will be stopped and he has not used any Nicorette gum and it will be stopped as well. Will continue current medications and will discharge the patient tomorrow for transport to [facility].    Principal Problem: MDD (major depressive disorder), recurrent severe, without psychosis ([place]) Diagnosis:   Patient Active Problem List   Diagnosis Date Noted  . Cocaine use disorder ([place]) [F14.10]   . MDD (major depressive disorder), recurrent severe, without psychosis ([place]) [F33.2] 06/23/2017  . Normocytic anemia [D64.9]   . Lipoma of right forearm [D17.21] 12/29/2016  . Chronic hepatitis C virus infection ([place]) G1a [B18.2] 12/28/2016  . GERD (gastroesophageal reflux disease) [K21.9] 12/10/2016  . Transaminitis [R74.0] 12/10/2016  . Colon cancer screening [Z12.11] 12/10/2016  . Substance induced mood disorder ([place]) [F19.94] 11/05/2016  . Alcohol use disorder, severe, dependence ([place]) [F10.20]    Total Time spent with patient: 15 minutes  Past Psychiatric History: See H&P  Past Medical History:  Past Medical History:  Diagnosis Date  . Anxiety   . EtOH dependence ([place])    QUIT AUG 8.  Marland Kitchen GERD without esophagitis   . Hepatitis C carrier ([place])   . Hypercholesteremia   . Hypertension   . Polysubstance dependence ([facility])    COCAINE-LAST DOSE AUG 8  . Stroke [facility])    has some  ST memory deficits from stroke.    Past Surgical History:  Procedure Laterality Date  . ANEURYSM COILING     AGE 65 x2  . APPENDECTOMY    . BIOPSY  01/05/2017   Procedure: BIOPSY;  Surgeon: Danie Binder, MD;  Location: [facility] ENDO SUITE;  Service: Endoscopy;;  Duodenal and Gastric  . COLONOSCOPY WITH PROPOFOL N/A 01/05/2017   Procedure: COLONOSCOPY WITH PROPOFOL;  Surgeon: Danie Binder, MD;  Location: [facility] ENDO SUITE;  Service: Endoscopy;  Laterality: N/A;  10:00am  . ESOPHAGOGASTRODUODENOSCOPY (EGD) WITH PROPOFOL N/A 01/05/2017   Procedure: ESOPHAGOGASTRODUODENOSCOPY (EGD) WITH PROPOFOL;  Surgeon: Danie Binder, MD;  Location: [facility] ENDO SUITE;  Service: Endoscopy;  Laterality: N/A;  . LAPAROSCOPIC APPENDECTOMY    . MASS EXCISION Right 01/13/2017   Procedure: EXCISION LIPOMA RIGHT ARM;  Surgeon: Virl Cagey, MD;  Location: [facility] ORS;  Service: General;  Laterality: Right;   Family History:  Family History  Problem Relation Age of Onset  . Stomach cancer Mother   . Bone cancer Sister 35  . Colon cancer Brother 85  . Colon polyps Neg Hx    Family Psychiatric  History: See H&P Social History:  Social History   Substance and Sexual Activity  Alcohol Use Yes   Comment: daily usage past 2 months     Social History   Substance and Sexual Activity  Drug Use Yes  . Types: "Crack" cocaine   Comment: last positive 10/2016. ssta used last 2 months ago.    Social History  Socioeconomic History  . Marital status: Married    Spouse name: Not on file  . Number of children: Not on file  . Years of education: Not on file  . Highest education level: Not on file  Occupational History  . Not on file  Social Needs  . Financial resource strain: Not on file  . Food insecurity:    Worry: Not on file    Inability: Not on file  . Transportation needs:    Medical: Not on file    Non-medical: Not on file  Tobacco Use  . Smoking status: Current Every Day Smoker    Packs/day: 0.25     Years: 25.00    Pack years: 6.25    Types: Cigarettes  . Smokeless tobacco: Never Used  Substance and Sexual Activity  . Alcohol use: Yes    Comment: daily usage past 2 months  . Drug use: Yes    Types: "Crack" cocaine    Comment: last positive 10/2016. ssta used last 2 months ago.  Marland Kitchen Sexual activity: Yes    Birth control/protection: Condom  Lifestyle  . Physical activity:    Days per week: Not on file    Minutes per session: Not on file  . Stress: Not on file  Relationships  . Social connections:    Talks on phone: Not on file    Gets together: Not on file    Attends religious service: Not on file    Active member of club or organization: Not on file    Attends meetings of clubs or organizations: Not on file    Relationship status: Not on file  Other Topics Concern  . Not on file  Social History Narrative   NOW ON SHORT TERM DISABILITY. 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   MARRIED-3 WITH WIFE AND 3 PREVIOUS. YOUNGEST 17, STILL AT HOME, OLDEST IS 31.   Additional Social History:                         Sleep: Good  Appetite:  Fair  Current Medications: Current Facility-Administered Medications  Medication Dose Route Frequency Provider Last Rate Last Dose  . acetaminophen (TYLENOL) tablet 650 mg  650 mg Oral Q6H PRN Ethelene Hal, NP      . alum & mag hydroxide-simeth (MAALOX/MYLANTA) 200-200-20 MG/5ML suspension 30 mL  30 mL Oral Q4H PRN Ethelene Hal, NP      . amLODipine (NORVASC) tablet 10 mg  10 mg Oral Daily Sharma Covert, MD   10 mg at 06/27/17 6160  . feeding supplement (BOOST / RESOURCE BREEZE) liquid 1 Container  1 Container Oral BID BM Cobos, Myer Peer, MD   1 Container at 06/27/17 1157  . FLUoxetine (PROZAC) capsule 20 mg  20 mg Oral Daily Sharma Covert, MD   20 mg at 06/27/17 7371  . hydrOXYzine (ATARAX/VISTARIL) tablet 25 mg  25 mg Oral TID PRN Ethelene Hal, NP      . loperamide (IMODIUM) capsule  2-4 mg  2-4 mg Oral PRN Lindon Romp A, NP      . magnesium hydroxide (MILK OF MAGNESIA) suspension 30 mL  30 mL Oral Daily PRN Ethelene Hal, NP      . metoprolol tartrate (LOPRESSOR) tablet 12.5 mg  12.5 mg Oral BID Cobos, Myer Peer, MD   Stopped at 06/27/17 [phone]  . multivitamin with minerals tablet 1 tablet  1 tablet Oral Daily  Cobos, Myer Peer, MD   1 tablet at 06/27/17 [phone]  . ondansetron (ZOFRAN-ODT) disintegrating tablet 4 mg  4 mg Oral Q6H PRN Lindon Romp A, NP      . QUEtiapine (SEROQUEL) tablet 300 mg  300 mg Oral Daily Ethelene Hal, NP   300 mg at 06/27/17 4034  . thiamine (VITAMIN B-1) tablet 100 mg  100 mg Oral Daily Ethelene Hal, NP   100 mg at 06/27/17 7425   Or  . thiamine (B-1) injection 100 mg  100 mg Intravenous Daily Ethelene Hal, NP      . traZODone (DESYREL) tablet 50 mg  50 mg Oral QHS PRN Ethelene Hal, NP   50 mg at 06/23/17 2153    Lab Results: No results found for this or any previous visit (from the past 73 hour(s)).  Blood Alcohol level:  Lab Results  Component Value Date   ETH 80 (H) 06/22/2017   ETH <5 95/63/8756    Metabolic Disorder Labs: Lab Results  Component Value Date   HGBA1C 5.6 11/05/2016   MPG 114.02 11/05/2016   Lab Results  Component Value Date   PROLACTIN 9.2 11/05/2016   Lab Results  Component Value Date   CHOL 123 11/05/2016   TRIG 200 (H) 11/05/2016   HDL 46 11/05/2016   CHOLHDL 2.7 11/05/2016   VLDL 40 11/05/2016   LDLCALC 37 11/05/2016    Physical Findings: AIMS: Facial and Oral Movements Muscles of Facial Expression: None, normal Lips and Perioral Area: None, normal Jaw: None, normal Tongue: None, normal,Extremity Movements Upper (arms, wrists, hands, fingers): None, normal Lower (legs, knees, ankles, toes): None, normal, Trunk Movements Neck, shoulders, hips: None, normal, Overall Severity Severity of abnormal movements (highest score from questions above): None,  normal Incapacitation due to abnormal movements: None, normal Patient's awareness of abnormal movements (rate only patient's report): No Awareness, Dental Status Current problems with teeth and/or dentures?: No Does patient usually wear dentures?: No  CIWA:  CIWA-Ar Total: 2 COWS:     Musculoskeletal: Strength & Muscle Tone: within normal limits Gait & Station: normal Patient leans: N/A  Psychiatric Specialty Exam: Physical Exam  Nursing note and vitals reviewed. Constitutional: He is oriented to person, place, and time. He appears well-developed and well-nourished.  Cardiovascular: Normal rate.  Respiratory: Effort normal.  Musculoskeletal: Normal range of motion.  Neurological: He is alert and oriented to person, place, and time.  Skin: Skin is warm.    Review of Systems  Constitutional: Negative.   HENT: Negative.   Eyes: Negative.   Respiratory: Negative.   Cardiovascular: Negative.   Gastrointestinal: Negative.   Genitourinary: Negative.   Musculoskeletal: Negative.   Skin: Negative.   Neurological: Negative.   Endo/Heme/Allergies: Negative.   Psychiatric/Behavioral: Negative.  Negative for hallucinations and suicidal ideas.    Blood pressure 127/84, pulse 73, temperature 99 F (37.2 C), resp. rate 20, height 5\' 3"  (1.6 m), weight 60.8 kg (134 lb).Body mass index is 23.74 kg/m.  General Appearance: Casual  Eye Contact:  Good  Speech:  Clear and Coherent and Normal Rate  Volume:  Normal  Mood:  Euthymic  Affect:  Congruent  Thought Process:  Goal Directed and Descriptions of Associations: Intact  Orientation:  Full (Time, Place, and Person)  Thought Content:  WDL  Suicidal Thoughts:  No  Homicidal Thoughts:  No  Memory:  Immediate;   Good Recent;   Good Remote;   Good  Judgement:  Good  Insight:  Good  Psychomotor Activity:  Normal  Concentration:  Concentration: Good  Recall:  Good  Fund of Knowledge:  Good  Language:  Good  Akathisia:  No  Handed:   Right  AIMS (if indicated):     Assets:  Communication Skills Desire for Improvement Financial Resources/Insurance Physical Health Social Support Transportation  ADL's:  Intact  Cognition:  WNL  Sleep:  Number of Hours: 4.5   Problems Addressed: Alcohol use disorder MDD Severe  Treatment Plan Summary: Daily contact with patient to assess and evaluate symptoms and progress in treatment, Medication management and Plan is to:  -Continue Prozac 20 mg PO Daily for mood stability -Continue Vistaril 25 mg PO TID PRN for anxiety -Continue Seroquel 300 mg PO QHS for mood stability -Continue Ativan Detox Protocol -Continue Trazodone 50 mg PO QHS PRN for insomnia -Encourage group therapy participation -Discharge to [place], FNP 06/27/2017, 2:32 PM

## 2017-06-27 NOTE — Progress Notes (Signed)
Patient did not attend the evening speaker AA meeting. Pt was notified that group was beginning but remained in bed.   

## 2017-06-27 NOTE — Progress Notes (Signed)
D.  Pt presents with a brighter affect today- observed interacting in milieu with peers- Per pt's self inventory, pt rates his depression, hopelessness and anxiety a 6/6/7, respectively. Pt endorsing mild withdrawal symptoms- agitation and irritability. Pt currently denies pain, SI/HI and AV hallucinations Pt writes that his most important goal today is, "to make it through today". . A. Labs and vitals monitored. Pt compliant with medications. Pt supported emotionally and encouraged to express concerns and ask questions.   R. Pt remains safe with 15 minute checks. Will continue POC.

## 2017-06-27 NOTE — [facility] Group Notes (Signed)
[place] LCSW Group Therapy Note  Date/Time:  06/27/2017 9:00  Type of Therapy and Topic:  Group Therapy:  Healthy and Unhealthy Supports  Participation Level:  Active   Description of Group:  Patients in this group were introduced to the idea of adding a variety of healthy supports to address the various needs in their lives.Patients discussed what additional healthy supports could be helpful in their recovery and wellness after discharge in order to prevent future hospitalizations.   An emphasis was placed on using counselor, doctor, therapy groups, 12-step groups, and problem-specific support groups to expand supports.  They also worked as a group on developing a specific plan for several patients to deal with unhealthy supports through [place], psychoeducation with loved ones, and even termination of relationships.   Therapeutic Goals:              1)  discuss importance of adding supports to stay well once out of the hospital             2)  compare healthy versus unhealthy supports and identify some examples of each             3)  generate ideas and descriptions of healthy supports that can be added             4)  offer mutual support about how to address unhealthy supports             5)  encourage active participation in and adherence to discharge plan               Summary of Patient Progress:   Pt continues to work towards their tx goals but has not yet reached them. Pt was able to appropriately participate in group discussion, and was able to offer support/validation to other group members. Pt reported feeling, "tired. I didn't get much sleep." Pt stated feeling that he has no supports at the moment, "I lost my parents, my wife has stopped caring about me. I don't know what to do." Pt identified prayer as a way that he can maintain healthy supports, however. CSW encouraged pt to attend groups outside of the hospital to increase his social supports. Pt was in agreement with this  information.   Therapeutic Modalities:   Motivational Interviewing Brief Solution-Focused Therapy  Alden Hipp, MSW, LCSW 06/27/2017 9:57 AM

## 2017-06-28 MED ORDER — METOPROLOL TARTRATE 25 MG PO TABS: 12.5000 mg | ORAL_TABLET | Freq: Two times a day (BID) | ORAL | 0 refills | Status: DC

## 2017-06-28 MED ORDER — HYDROXYZINE HCL 25 MG PO TABS: 25.0000 mg | ORAL_TABLET | Freq: Three times a day (TID) | ORAL | 0 refills | Status: DC | PRN

## 2017-06-28 MED ORDER — FLUOXETINE HCL 20 MG PO CAPS: 20.0000 mg | ORAL_CAPSULE | Freq: Every day | ORAL | 0 refills | Status: DC

## 2017-06-28 MED ORDER — AMLODIPINE BESYLATE 10 MG PO TABS: 10.0000 mg | ORAL_TABLET | Freq: Every day | ORAL | 0 refills | Status: DC

## 2017-06-28 MED ORDER — TRAZODONE HCL 50 MG PO TABS: 50.0000 mg | ORAL_TABLET | Freq: Every evening | ORAL | 0 refills | Status: DC | PRN

## 2017-06-28 NOTE — Progress Notes (Signed)
Discharge note:  Patient discharged per MD order.  Patient received all belongings from unit and locker.  He received prescriptions and medication samples.  Reviewed AVS/transition record with patient and he indicated understanding.  Patient is to be transported to [facility] today for further treatment.  He denies any thoughts of self harm.  He rates his depression and hopelessness as a 6; anxiety as a 5.  He is sleeping and eating well; energy level remains low and concentration is poor.  His goal today is to "survive." Patient left ambulatory with representative from [facility].

## 2017-06-28 NOTE — Discharge Summary (Signed)
Physician Discharge Summary Note  Patient:  [name] is an 47 y.o., male MRN:  [id number] DOB:  [birth year] Patient phone:  [phone] (home)  Patient address:   [place] [address],  Total Time spent with patient: 20 minutes  Date of Admission:  06/23/2017 Date of Discharge: 06/28/17  Reason for Admission:  Worsening depression with SI  Principal Problem: MDD (major depressive disorder), recurrent severe, without psychosis [facility]) Discharge Diagnoses: Patient Active Problem List   Diagnosis Date Noted  . Cocaine use disorder ([place]) [F14.10]   . MDD (major depressive disorder), recurrent severe, without psychosis ([place]) [F33.2] 06/23/2017  . Normocytic anemia [D64.9]   . Lipoma of right forearm [D17.21] 12/29/2016  . Chronic hepatitis C virus infection ([place]) G1a [B18.2] 12/28/2016  . GERD (gastroesophageal reflux disease) [K21.9] 12/10/2016  . Transaminitis [R74.0] 12/10/2016  . Colon cancer screening [Z12.11] 12/10/2016  . Substance induced mood disorder ([place]) [F19.94] 11/05/2016  . Alcohol use disorder, severe, dependence ([place]) [F10.20]     Past Psychiatric History: Patient admitted that he had had a previous hospitalization at [facility] in August 2018 for substance use as well as depression.  He later went to a substance abuse facility.  He has seen a counselor in the past, but it been a long time.  He is not currently seeing a psychiatrist.  His medications are being prescribed by his family doctor.  Past Medical History:  Past Medical History:  Diagnosis Date  . Anxiety   . EtOH dependence ([place])    QUIT AUG 8.  Marland Kitchen GERD without esophagitis   . Hepatitis C carrier ([place])   . Hypercholesteremia   . Hypertension   . Polysubstance dependence (HCC)    COCAINE-LAST DOSE AUG 8  . Stroke [facility])    has some ST memory deficits from stroke.    Past Surgical History:  Procedure Laterality Date  . ANEURYSM COILING     AGE 42 x2  . APPENDECTOMY    . BIOPSY   01/05/2017   Procedure: BIOPSY;  Surgeon: Danie Binder, MD;  Location: [facility] ENDO SUITE;  Service: Endoscopy;;  Duodenal and Gastric  . COLONOSCOPY WITH PROPOFOL N/A 01/05/2017   Procedure: COLONOSCOPY WITH PROPOFOL;  Surgeon: Danie Binder, MD;  Location: [facility] ENDO SUITE;  Service: Endoscopy;  Laterality: N/A;  10:00am  . ESOPHAGOGASTRODUODENOSCOPY (EGD) WITH PROPOFOL N/A 01/05/2017   Procedure: ESOPHAGOGASTRODUODENOSCOPY (EGD) WITH PROPOFOL;  Surgeon: Danie Binder, MD;  Location: [facility] ENDO SUITE;  Service: Endoscopy;  Laterality: N/A;  . LAPAROSCOPIC APPENDECTOMY    . MASS EXCISION Right 01/13/2017   Procedure: EXCISION LIPOMA RIGHT ARM;  Surgeon: Virl Cagey, MD;  Location: [facility] ORS;  Service: General;  Laterality: Right;   Family History:  Family History  Problem Relation Age of Onset  . Stomach cancer Mother   . Bone cancer Sister 58  . Colon cancer Brother 31  . Colon polyps Neg Hx    Family Psychiatric  History: Uncle has mental health problems, brother has substance abuse issues  Social History:  Social History   Substance and Sexual Activity  Alcohol Use Yes   Comment: daily usage past 2 months     Social History   Substance and Sexual Activity  Drug Use Yes  . Types: "Crack" cocaine   Comment: last positive 10/2016. ssta used last 2 months ago.    Social History   Socioeconomic History  . Marital status: Married    Spouse name: Not on  file  . Number of children: Not on file  . Years of education: Not on file  . Highest education level: Not on file  Occupational History  . Not on file  Social Needs  . Financial resource strain: Not on file  . Food insecurity:    Worry: Not on file    Inability: Not on file  . Transportation needs:    Medical: Not on file    Non-medical: Not on file  Tobacco Use  . Smoking status: Current Every Day Smoker    Packs/day: 0.25    Years: 25.00    Pack years: 6.25    Types: Cigarettes  . Smokeless tobacco: Never Used   Substance and Sexual Activity  . Alcohol use: Yes    Comment: daily usage past 2 months  . Drug use: Yes    Types: "Crack" cocaine    Comment: last positive 10/2016. ssta used last 2 months ago.  Marland Kitchen Sexual activity: Yes    Birth control/protection: Condom  Lifestyle  . Physical activity:    Days per week: Not on file    Minutes per session: Not on file  . Stress: Not on file  Relationships  . Social connections:    Talks on phone: Not on file    Gets together: Not on file    Attends religious service: Not on file    Active member of club or organization: Not on file    Attends meetings of clubs or organizations: Not on file    Relationship status: Not on file  Other Topics Concern  . Not on file  Social History Narrative   NOW ON SHORT TERM DISABILITY. 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   MARRIED-3 WITH WIFE AND 3 PREVIOUS. YOUNGEST 17, STILL AT HOME, OLDEST IS 31.    Hospital Course:   06/24/17 [facility] MD Assessment: Patient is seen and examined.  Patient is a 47 year old male with a past psychiatric history significant for major depression, alcohol use disorder, cocaine use disorder, and chronic hepatitis C which is been treated with Harvoni.  The patient stated he had increased recent psychosocial stressors.  He stated he talked to his wife yesterday, and she told him that she was leaving him.  This was unexpected.  The patient stated had some suggestion of problems.  He works at nights, and had not been intimate with her in some time.  After she told him that she was leaving him, he decompensated, and started drinking alcohol and using cocaine.  He stated he had been relatively sober until the last week.  He stated he had had 3-4 months of sobriety.  He stated that the alcohol started approximately 1 week ago.  He admitted to cocaine use.  He admitted to helplessness, hopelessness and worthlessness.  He stated he felt lost.  He was admitted to the hospital for evaluation  and stabilization.   Patient remained on the [facility] unit for 4 days and stabilized with medication, sobriety, and therapy. Patient was discharged on Prozac 20 mg Daily, Vistaril 25 mg TID PRN, and Trazodone 50 mg QHS PRN. Patient completed alcohol detox. Patient showed improvement with improved mood, affect, sleep, appetite, and interaction. Patient has been seen in the day room interacting with peers and staff appropriately. Patient has been attending group and participating. Patient is being discharged to [facility]. Patient denies any SI/HI/AVH and contracts for safety. Patient is provided with prescriptions and samples for his medications upon discharge.  Physical Findings: AIMS: Facial and Oral Movements Muscles of Facial Expression: None, normal Lips and Perioral Area: None, normal Jaw: None, normal Tongue: None, normal,Extremity Movements Upper (arms, wrists, hands, fingers): None, normal Lower (legs, knees, ankles, toes): None, normal, Trunk Movements Neck, shoulders, hips: None, normal, Overall Severity Severity of abnormal movements (highest score from questions above): None, normal Incapacitation due to abnormal movements: None, normal Patient's awareness of abnormal movements (rate only patient's report): No Awareness, Dental Status Current problems with teeth and/or dentures?: No Does patient usually wear dentures?: No  CIWA:  CIWA-Ar Total: 0 COWS:     Musculoskeletal: Strength & Muscle Tone: within normal limits Gait & Station: normal Patient leans: N/A  Psychiatric Specialty Exam: Physical Exam  Nursing note and vitals reviewed. Constitutional: He is oriented to person, place, and time. He appears well-developed and well-nourished.  Cardiovascular: Normal rate.  Respiratory: Effort normal.  Musculoskeletal: Normal range of motion.  Neurological: He is alert and oriented to person, place, and time.  Skin: Skin is warm.    Review of Systems   Constitutional: Negative.   HENT: Negative.   Eyes: Negative.   Respiratory: Negative.   Cardiovascular: Negative.   Gastrointestinal: Negative.   Genitourinary: Negative.   Musculoskeletal: Negative.   Skin: Negative.   Neurological: Negative.   Endo/Heme/Allergies: Negative.   Psychiatric/Behavioral: Negative.     Blood pressure 137/87, pulse 72, temperature 98.7 F (37.1 C), temperature source Oral, resp. rate 18, height 5\' 3"  (1.6 m), weight 60.8 kg (134 lb).Body mass index is 23.74 kg/m.  General Appearance: Casual  Eye Contact:  Good  Speech:  Clear and Coherent and Normal Rate  Volume:  Normal  Mood:  Euthymic  Affect:  Congruent  Thought Process:  Goal Directed and Descriptions of Associations: Intact  Orientation:  Full (Time, Place, and Person)  Thought Content:  WDL  Suicidal Thoughts:  No  Homicidal Thoughts:  No  Memory:  Immediate;   Good Recent;   Good Remote;   Good  Judgement:  Fair  Insight:  Good  Psychomotor Activity:  Normal  Concentration:  Concentration: Good and Attention Span: Good  Recall:  Good  Fund of Knowledge:  Good  Language:  Good  Akathisia:  No  Handed:  Right  AIMS (if indicated):     Assets:  Communication Skills Desire for Improvement Financial Resources/Insurance Housing Physical Health Social Support Transportation  ADL's:  Intact  Cognition:  WNL  Sleep:  Number of Hours: 5     Have you used any form of tobacco in the last 30 days? (Cigarettes, Smokeless Tobacco, Cigars, and/or Pipes): Yes  Has this patient used any form of tobacco in the last 30 days? (Cigarettes, Smokeless Tobacco, Cigars, and/or Pipes) Yes, Yes, A prescription for an FDA-approved tobacco cessation medication was offered at discharge and the patient refused  Blood Alcohol level:  Lab Results  Component Value Date   ETH 80 (H) 06/22/2017   ETH <5 45/40/9811    Metabolic Disorder Labs:  Lab Results  Component Value Date   HGBA1C 5.6  11/05/2016   MPG 114.02 11/05/2016   Lab Results  Component Value Date   PROLACTIN 9.2 11/05/2016   Lab Results  Component Value Date   CHOL 123 11/05/2016   TRIG 200 (H) 11/05/2016   HDL 46 11/05/2016   CHOLHDL 2.7 11/05/2016   VLDL 40 11/05/2016   LDLCALC 37 11/05/2016    See Psychiatric Specialty Exam and Suicide Risk Assessment completed by Attending Physician  prior to discharge.  Discharge destination:  Home  Is patient on multiple antipsychotic therapies at discharge:  No   Has Patient had three or more failed trials of antipsychotic monotherapy by history:  No  Recommended Plan for Multiple Antipsychotic Therapies: NA   Allergies as of 06/28/2017   No Known Allergies     Medication List    STOP taking these medications   docusate sodium 100 MG capsule Commonly known as:  COLACE   Ledipasvir-Sofosbuvir 90-400 MG Tabs Commonly known as:  HARVONI   lisinopril 10 MG tablet Commonly known as:  PRINIVIL,ZESTRIL   mirtazapine 7.5 MG tablet Commonly known as:  REMERON   oxyCODONE 5 MG immediate release tablet Commonly known as:  ROXICODONE   pantoprazole 40 MG tablet Commonly known as:  PROTONIX   QUEtiapine 300 MG tablet Commonly known as:  SEROQUEL     TAKE these medications     Indication  amLODipine 10 MG tablet Commonly known as:  NORVASC Take 1 tablet (10 mg total) by mouth daily. For high blood pressure Start taking on:  06/29/2017 What changed:    medication strength  how much to take  additional instructions  Indication:  High Blood Pressure Disorder   FLUoxetine 20 MG capsule Commonly known as:  PROZAC Take 1 capsule (20 mg total) by mouth daily. For mood control Start taking on:  06/29/2017  Indication:  mood stability   hydrOXYzine 25 MG tablet Commonly known as:  ATARAX/VISTARIL Take 1 tablet (25 mg total) by mouth 3 (three) times daily as needed for anxiety.  Indication:  Feeling Anxious   metoprolol tartrate 25 MG  tablet Commonly known as:  LOPRESSOR Take 0.5 tablets (12.5 mg total) by mouth 2 (two) times daily. For high blood pressure What changed:  additional instructions  Indication:  High Blood Pressure Disorder   traZODone 50 MG tablet Commonly known as:  DESYREL Take 1 tablet (50 mg total) by mouth at bedtime as needed for sleep.  Indication:  [facility] Follow up on 06/28/2017.   Why:  You have been accepted for treatment today. Driver will pick you up this afternoon to transport you directly to facility. Thank you.  Contact information: [place] [address] [phone]           Follow-up recommendations:  Continue activity as tolerated. Continue diet as recommended by your PCP. Ensure to keep all appointments with outpatient providers.  Comments:  Patient is instructed prior to discharge to: Take all medications as prescribed by his/her mental healthcare provider. Report any adverse effects and or reactions from the medicines to his/her outpatient provider promptly. Patient has been instructed & cautioned: To not engage in alcohol and or illegal drug use while on prescription medicines. In the event of worsening symptoms, patient is instructed to call the crisis hotline, 911 and or go to the nearest ED for appropriate evaluation and treatment of symptoms. To follow-up with his/her primary care provider for your other medical issues, concerns and or health care needs.    Signed: Lowry Ram Trudy Kory, FNP 06/28/2017, 2:53 PM

## 2017-06-28 NOTE — Progress Notes (Signed)
  [facility] Adult Case Management Discharge Plan :  Will you be returning to the same living situation after discharge:  No. Pt accepted to [facility] for today.  At discharge, do you have transportation home?: Yes,  driver from facility will pick him up this afternoon for transport. Do you have the ability to pay for your medications: Yes,  BCBS insurance.  Release of information consent forms completed and submitted to medical records by CSW.  Patient to Follow up at: [facility] Follow up on 06/28/2017.   Why:  You have been accepted for treatment today. Driver will pick you up this afternoon to transport you directly to facility. Thank you.  Contact information: [place] [address] [phone]           Next level of care provider has access to [place] and Suicide Prevention discussed: Yes,  SPE completed with pt's wife; SPI pamphlet and Mobile Crisis information provided.  Have you used any form of tobacco in the last 30 days? (Cigarettes, Smokeless Tobacco, Cigars, and/or Pipes): Yes  Has patient been referred to the Quitline?: Patient refused referral  Patient has been referred for addiction treatment: Yes  [employer], LCSW 06/28/2017, 9:48 AM

## 2017-06-28 NOTE — BHH Suicide Risk Assessment (Signed)
[facility] Discharge Suicide Risk Assessment   Principal Problem: MDD (major depressive disorder), recurrent severe, without psychosis ([place]) Discharge Diagnoses:  Patient Active Problem List   Diagnosis Date Noted  . Cocaine use disorder ([place]) [F14.10]   . MDD (major depressive disorder), recurrent severe, without psychosis ([place]) [F33.2] 06/23/2017  . Normocytic anemia [D64.9]   . Lipoma of right forearm [D17.21] 12/29/2016  . Chronic hepatitis C virus infection ([place]) G1a [B18.2] 12/28/2016  . GERD (gastroesophageal reflux disease) [K21.9] 12/10/2016  . Transaminitis [R74.0] 12/10/2016  . Colon cancer screening [Z12.11] 12/10/2016  . Substance induced mood disorder ([place]) [F19.94] 11/05/2016  . Alcohol use disorder, severe, dependence ([place]) [F10.20]     Total Time spent with patient: 30 minutes  Musculoskeletal: Strength & Muscle Tone: within normal limits Gait & Station: normal Patient leans: N/A  Psychiatric Specialty Exam: Review of Systems  All other systems reviewed and are negative.   Blood pressure 137/87, pulse 72, temperature 98.7 F (37.1 C), temperature source Oral, resp. rate 18, height 5\' 3"  (1.6 m), weight 60.8 kg (134 lb).Body mass index is 23.74 kg/m.  General Appearance: Fairly Groomed  Engineer, water::  Good  Speech:  Normal Rate409  Volume:  Normal  Mood:  Euthymic  Affect:  Appropriate  Thought Process:  Coherent  Orientation:  Full (Time, Place, and Person)  Thought Content:  Logical  Suicidal Thoughts:  No  Homicidal Thoughts:  No  Memory:  Immediate;   Good  Judgement:  Fair  Insight:  Fair  Psychomotor Activity:  Normal  Concentration:  Good  Recall:  Good  Fund of Knowledge:Good  Language: Good  Akathisia:  No  Handed:  Right  AIMS (if indicated):     Assets:  Communication Skills Desire for Improvement Social Support  Sleep:  Number of Hours: 5  Cognition: WNL  ADL's:  Intact   Mental Status Per Nursing Assessment::   On Admission:      Demographic Factors:  Male and Low socioeconomic status  Loss Factors: NA  Historical Factors: Impulsivity  Risk Reduction Factors:   Sense of responsibility to family, Positive social support and NA  Continued Clinical Symptoms:  Alcohol/Substance Abuse/Dependencies  Cognitive Features That Contribute To Risk:  None    Suicide Risk:  Minimal: No identifiable suicidal ideation.  Patients presenting with no risk factors but with morbid ruminations; may be classified as minimal risk based on the severity of the depressive symptoms    Plan Of Care/Follow-up recommendations:  Activity:  ad lib  Sharma Covert, MD 06/28/2017, 7:44 AM

## 2017-06-28 NOTE — Plan of Care (Signed)
  Problem: Education: Goal: Knowledge of [facility] Education information/materials will improve Outcome: Completed/Met Goal: Emotional status will improve Outcome: Completed/Met Goal: Mental status will improve Outcome: Completed/Met Goal: Verbalization of understanding the information provided will improve Outcome: Completed/Met   Problem: Activity: Goal: Interest or engagement in activities will improve Outcome: Completed/Met Goal: Sleeping patterns will improve Outcome: Completed/Met   Problem: Coping: Goal: Ability to verbalize frustrations and anger appropriately will improve Outcome: Completed/Met Goal: Ability to demonstrate self-control will improve Outcome: Completed/Met   Problem: Health Behavior/Discharge Planning: Goal: Identification of resources available to assist in meeting health care needs will improve Outcome: Completed/Met Goal: Compliance with treatment plan for underlying cause of condition will improve Outcome: Completed/Met   Problem: Physical Regulation: Goal: Ability to maintain clinical measurements within normal limits will improve Outcome: Completed/Met   Problem: Safety: Goal: Periods of time without injury will increase Outcome: Completed/Met   Problem: Education: Goal: Ability to make informed decisions regarding treatment will improve Outcome: Completed/Met   Problem: Coping: Goal: Coping ability will improve Outcome: Completed/Met   Problem: Health Behavior/Discharge Planning: Goal: Identification of resources available to assist in meeting health care needs will improve Outcome: Completed/Met   Problem: Medication: Goal: Compliance with prescribed medication regimen will improve Outcome: Completed/Met   Problem: Self-Concept: Goal: Ability to disclose and discuss suicidal ideas will improve Outcome: Completed/Met Goal: Will verbalize positive feelings about self Outcome: Completed/Met   Problem: Education: Goal:  Utilization of techniques to improve thought processes will improve Outcome: Completed/Met Goal: Knowledge of the prescribed therapeutic regimen will improve Outcome: Completed/Met   Problem: Activity: Goal: Interest or engagement in leisure activities will improve Outcome: Completed/Met Goal: Imbalance in normal sleep/wake cycle will improve Outcome: Completed/Met   Problem: Coping: Goal: Coping ability will improve Outcome: Completed/Met Goal: Will verbalize feelings Outcome: Completed/Met   Problem: Health Behavior/Discharge Planning: Goal: Ability to make decisions will improve Outcome: Completed/Met Goal: Compliance with therapeutic regimen will improve Outcome: Completed/Met   Problem: Role Relationship: Goal: Will demonstrate positive changes in social behaviors and relationships Outcome: Completed/Met   Problem: Safety: Goal: Ability to disclose and discuss suicidal ideas will improve Outcome: Completed/Met Goal: Ability to identify and utilize support systems that promote safety will improve Outcome: Completed/Met   Problem: Self-Concept: Goal: Will verbalize positive feelings about self Outcome: Completed/Met Goal: Level of anxiety will decrease Outcome: Completed/Met   Problem: Education: Goal: Knowledge of disease or condition will improve Outcome: Completed/Met Goal: Understanding of discharge needs will improve Outcome: Completed/Met   Problem: Health Behavior/Discharge Planning: Goal: Ability to identify changes in lifestyle to reduce recurrence of condition will improve Outcome: Completed/Met Goal: Identification of resources available to assist in meeting health care needs will improve Outcome: Completed/Met   Problem: Physical Regulation: Goal: Complications related to the disease process, condition or treatment will be avoided or minimized Outcome: Completed/Met   Problem: Safety: Goal: Ability to remain free from injury will  improve Outcome: Completed/Met   Problem: Spiritual Needs Goal: Ability to function at adequate level Outcome: Completed/Met  Patient discharged per MD order.  Patient will be transported to [facility] later today.

## 2017-06-28 NOTE — Progress Notes (Signed)
D.  Pt in room in bed on approach.  Pt did not get up for evening AA group and has remained in bed throughout.  No complaints voiced.  Respirations even and unlabored, no distress noted.  A.  Will continue to monitor  R.  Pt remains safe on the unit.

## 2017-06-28 NOTE — Progress Notes (Signed)
Recreation Therapy Notes  Date: 4.1.19 Time: 9:30 a.m. Location: [address] Dayroom   Group Topic: Stress Management   Goal Area(s) Addresses:  Goal 1.1: To reduce stress  -Patient will report feeling a reduction in stress level  -Patient will identify the importance of stress management  -Patient will participate during stress management group treatment     Behavioral Response: Engaged   Intervention: Stress Management   Activity: Guided Imagery- Patients were in a peaceful environment with soft lighting enhancing patients mood. Patients were read a guided imagery script to help decrease stress levels   Education: Stress Management, Discharge Planning.    Education Outcome: Acknowledges edcuation/In group clarification offered/Needs additional education   Clinical Observations/Feedback:: Patient attended and participated appropriately during stress management group treatment. Patient reported feeling a reduction in stress level.    Ranell Patrick, Recreation Therapy Intern   Ranell Patrick 06/28/2017 9:11 AM

## 2017-06-28 NOTE — BHH Group Notes (Signed)
Pt attended spiritual care group on grief and loss facilitated by chaplain Jerene Pitch   Group opened with brief discussion and psycho-social ed around grief and loss in relationships and in relation to self - identifying life patterns, circumstances, changes that cause losses. Established group norm of speaking from own life experience. Group goal of establishing open and affirming space for members to share understanding of and experience with grief, normalize grief experience and provide psycho social education and grief support.  Group engaged in facilitated dialog around group topic. Engaged with four tasks of mourning.   Rage was present throughout group.  Alert, oriented and attentive to group discussion.  [place] did not engage throughout group discussion.  However, at end of group, Tyreke stated he was grateful for members of the group and that he felt the time was productive.  Expressed gratitude for group members who spoke about the difficulty of setting boundaries with children and recognizing boundaries set for themselves by others.         [facility] / [facility] Chaplain Jerene Pitch, [facility]

## 2018-05-08 IMAGING — US US ABDOMEN LIMITED
1 series · 14 of 25 positions shown · non-contrast
Comparison: None.

CLINICAL DATA: Positive hepatitis-C

EXAM:
ULTRASOUND ABDOMEN LIMITED RIGHT UPPER QUADRANT

[Series 1: us abdomen limited · 0.18mm/px · 14 of 65 slices shown]
[im 1/65]
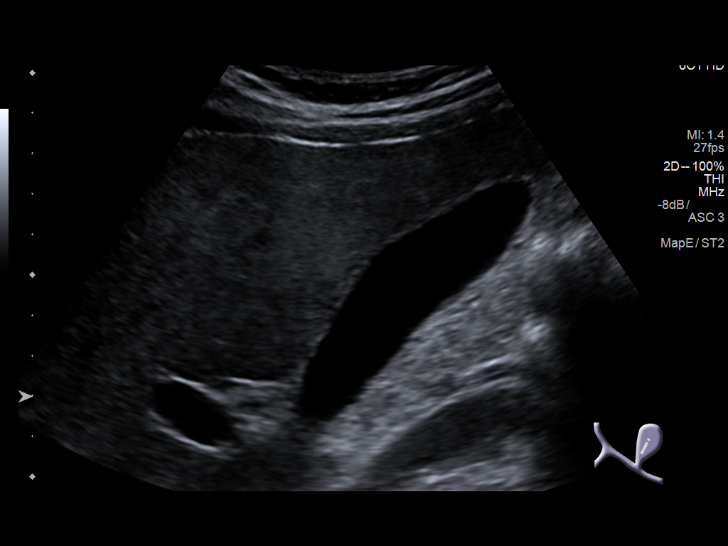
[im 6/65]
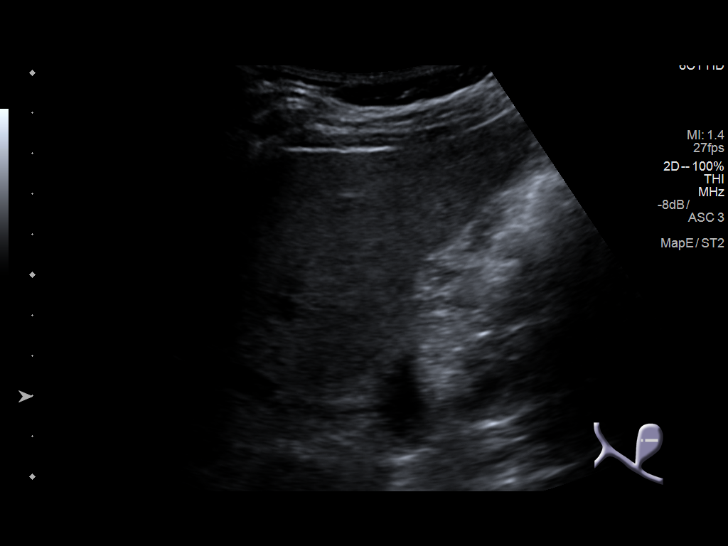
[im 11/65]
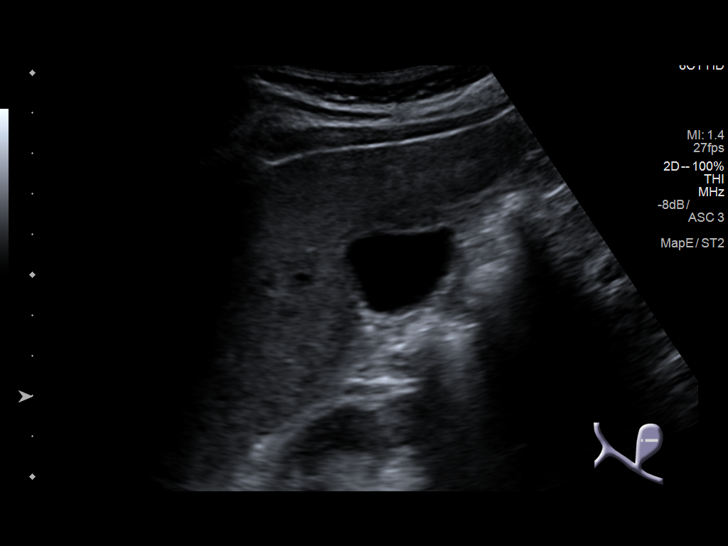
[im 17/65]
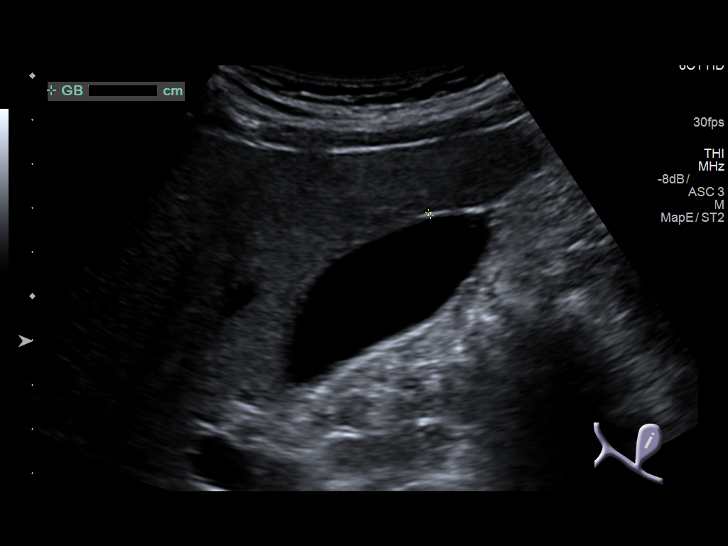
[im 22/65]
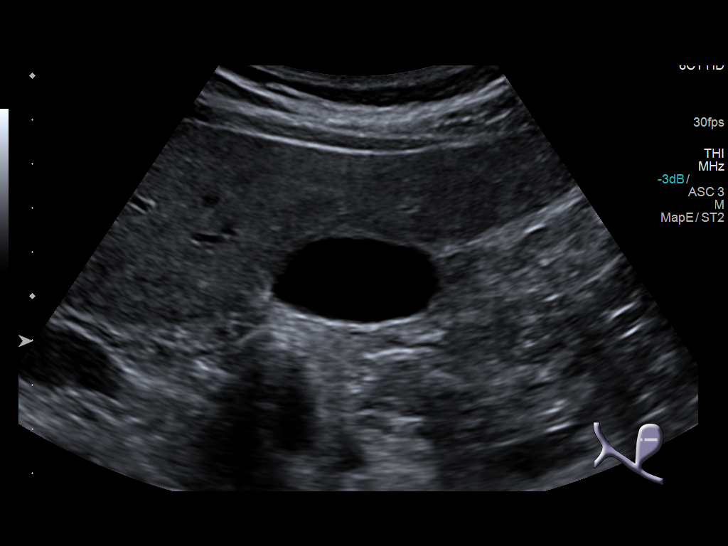
[im 25/65]
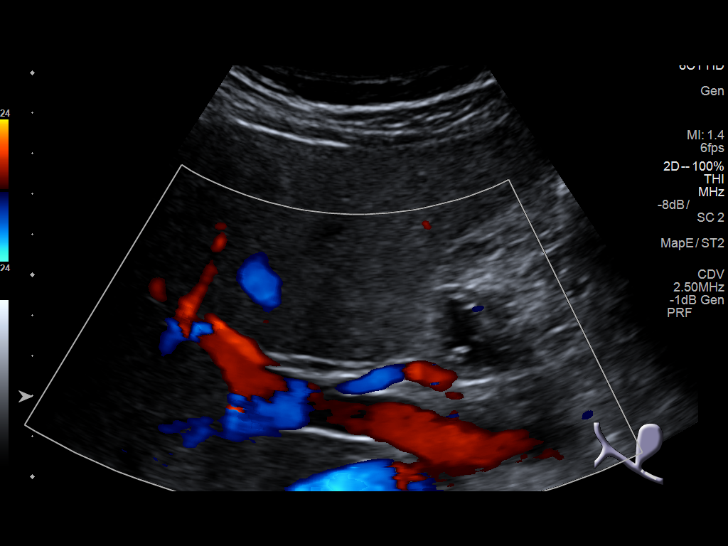
[im 30/65]
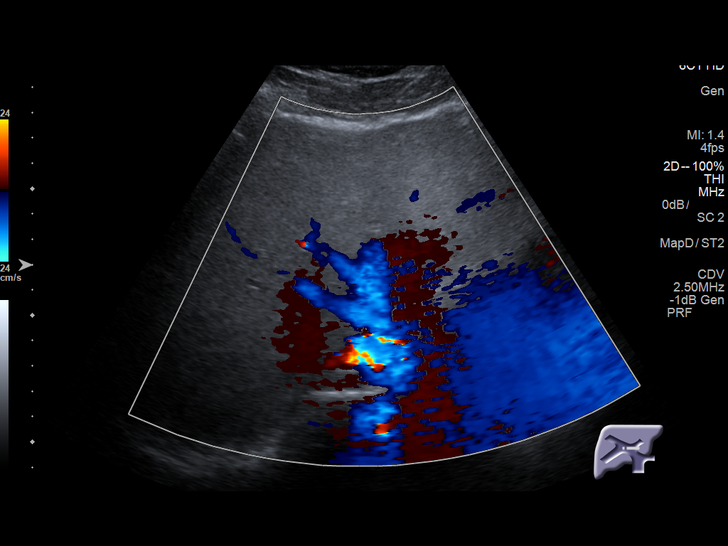
[im 35/65]
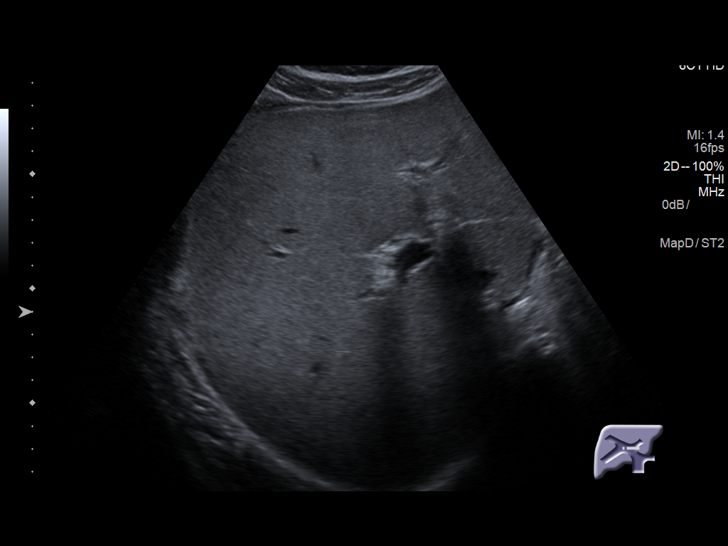
[im 41/65]
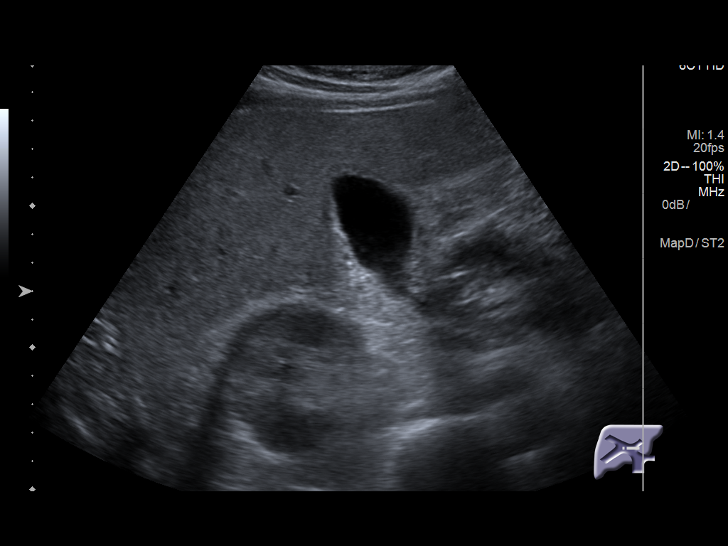
[im 43/65]
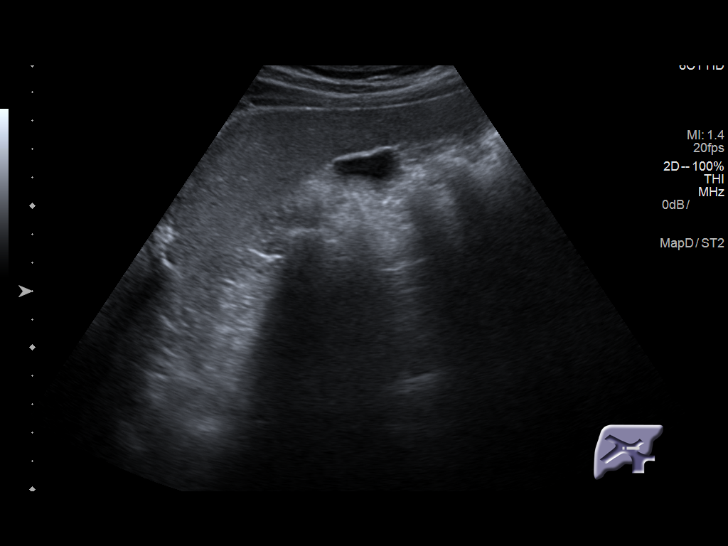
[im 49/65]
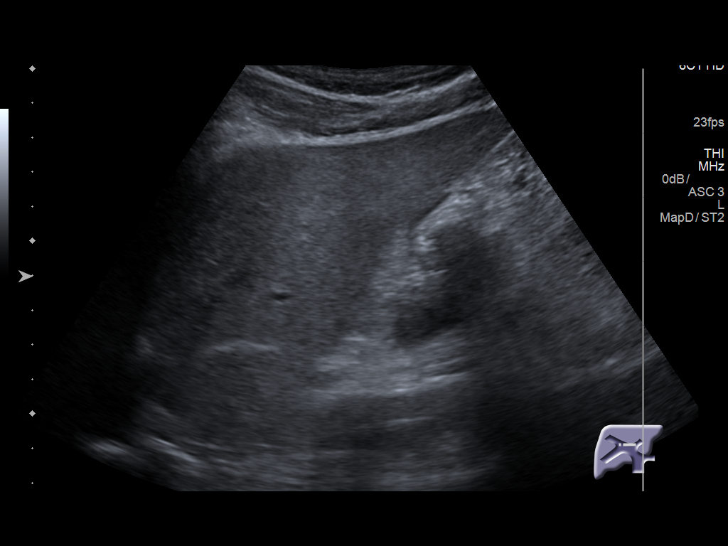
[im 54/65]
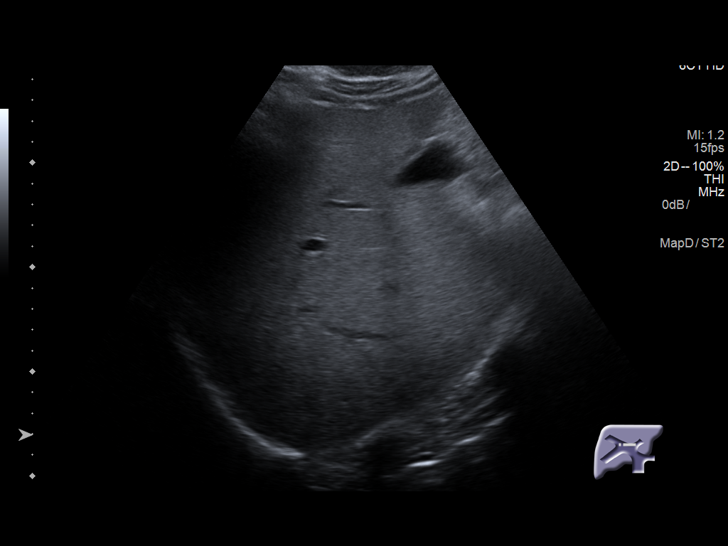
[im 59/65]
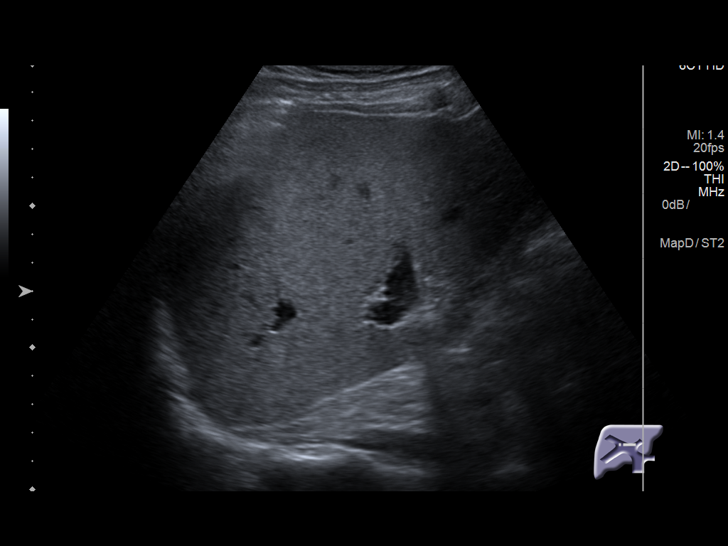
[im 65/65]
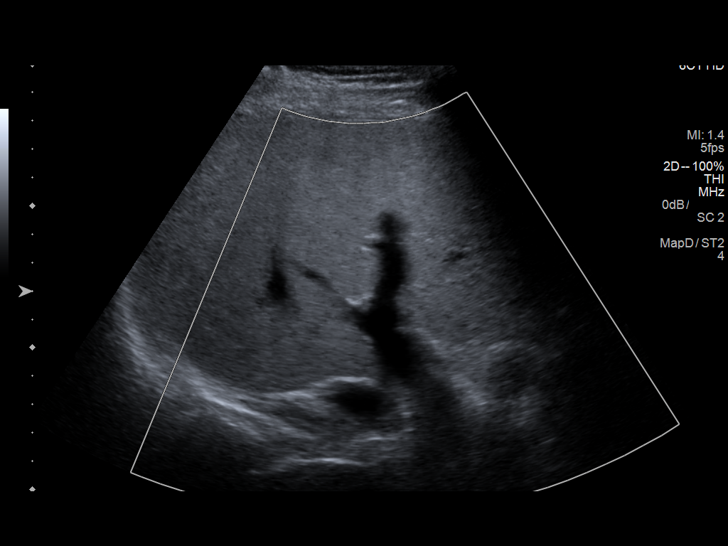

[14 of 25 positions shown; findings below may reference images not displayed]

FINDINGS: Gallbladder:

No gallstones or wall thickening visualized. No sonographic Murphy
sign noted by sonographer.

Common bile duct:

Diameter: 2 mm

Liver:

No focal lesion identified. Within normal limits in parenchymal
echogenicity. Portal vein is patent on color Doppler imaging with
normal direction of blood flow towards the liver.
IMPRESSION: No acute abnormality noted.

## 2018-05-26 ENCOUNTER — Other Ambulatory Visit: Payer: Self-pay

## 2018-05-26 ENCOUNTER — Telehealth: Payer: Self-pay | Admitting: Gastroenterology

## 2018-05-26 DIAGNOSIS — B182 Chronic viral hepatitis C: Secondary | ICD-10-CM

## 2018-05-26 NOTE — Telephone Encounter (Signed)
Forwarding to Dr. Fields to advise! 

## 2018-05-26 NOTE — Telephone Encounter (Signed)
[phone] or [phone]  Patient wife called about spouses hep-c, was told that it was no detected anymore but patient went to a plasma center and was told it was back.  She wants to know if that is a possibility.

## 2018-05-26 NOTE — Telephone Encounter (Addendum)
PLEASE CALL PT. The last instruction he received was to have HIS HEPATITIS C VL rechecked in [facility] 2019 because his VL was barely detectable in FEB 2019. He did not have the follow up lab. He should COMPLETE HIS Hepatitis C viral load.

## 2018-05-26 NOTE — Telephone Encounter (Signed)
PT's wife, Randell Patient, called and asked me why did I call the pt back. Said she is the one who called and inquired about this and she was wondering if pt had taken her off of the HIPPA forms. I told her she is still on the forms and when I called he answered so I just told him. She said they are separated now but she has to help him figure out what to do. They are both uninsured. She works at [employer] office. She was asking if someone else could order the test. I told her what Dr. Oneida Alar has ordered and said that is what he needs. She will check and see where is best place for him to have the test and let me know if there are problems. She also asked how much would OV be if he has to come in. Per Manuela Schwartz, I told her the cost would vary depended on what had to be done, but pt could pay a little and apply for [facility] benefits. She will let me know if there are any problems.

## 2018-05-26 NOTE — Telephone Encounter (Signed)
Pt is aware to go to [facility] for the lab.

## 2018-05-26 NOTE — Telephone Encounter (Signed)
REVIEWED-NO ADDITIONAL RECOMMENDATIONS. 

## 2018-08-10 ENCOUNTER — Ambulatory Visit (HOSPITAL_COMMUNITY)
Admission: RE | Admit: 2018-08-10 | Discharge: 2018-08-10 | Disposition: A | Discharge status: Home / Self Care | Payer: Medicaid - Out of State | Attending: Psychiatry | Admitting: Psychiatry

## 2018-08-10 ENCOUNTER — Emergency Department (HOSPITAL_COMMUNITY)
Admission: EM | Admit: 2018-08-10 | Discharge: 2018-08-10 | Disposition: A | Discharge status: Home / Self Care | Payer: Medicaid - Out of State | Attending: Emergency Medicine | Admitting: Emergency Medicine

## 2018-08-10 ENCOUNTER — Other Ambulatory Visit: Payer: Self-pay

## 2018-08-10 DIAGNOSIS — F1910 Other psychoactive substance abuse, uncomplicated: Secondary | ICD-10-CM | POA: Diagnosis not present

## 2018-08-10 DIAGNOSIS — F331 Major depressive disorder, recurrent, moderate: Secondary | ICD-10-CM | POA: Diagnosis not present

## 2018-08-10 DIAGNOSIS — K219 Gastro-esophageal reflux disease without esophagitis: Secondary | ICD-10-CM | POA: Diagnosis not present

## 2018-08-10 DIAGNOSIS — F17210 Nicotine dependence, cigarettes, uncomplicated: Secondary | ICD-10-CM | POA: Insufficient documentation

## 2018-08-10 DIAGNOSIS — Z800 Family history of malignant neoplasm of digestive organs: Secondary | ICD-10-CM | POA: Insufficient documentation

## 2018-08-10 DIAGNOSIS — I10 Essential (primary) hypertension: Secondary | ICD-10-CM | POA: Diagnosis not present

## 2018-08-10 DIAGNOSIS — F1420 Cocaine dependence, uncomplicated: Secondary | ICD-10-CM | POA: Diagnosis not present

## 2018-08-10 DIAGNOSIS — Z79899 Other long term (current) drug therapy: Secondary | ICD-10-CM | POA: Insufficient documentation

## 2018-08-10 DIAGNOSIS — F332 Major depressive disorder, recurrent severe without psychotic features: Secondary | ICD-10-CM | POA: Insufficient documentation

## 2018-08-10 DIAGNOSIS — F1020 Alcohol dependence, uncomplicated: Secondary | ICD-10-CM | POA: Diagnosis not present

## 2018-08-10 MED ORDER — CHLORDIAZEPOXIDE HCL 25 MG PO CAPS: ORAL_CAPSULE | ORAL | 0 refills | Status: DC

## 2018-08-10 NOTE — ED Notes (Signed)
Patient left without completing the discharge process. He also left without his AVS and prescription.

## 2018-08-10 NOTE — ED Provider Notes (Signed)
[facility] DEPT Provider Note   CSN: [id number] Arrival date & time: 08/10/18  1612    History   Chief Complaint Chief Complaint  Patient presents with  . Depression  . Addiction Problem    HPI [name] is a 48 y.o. male.     HPI Patient presents for detox from cocaine and alcohol.  States he drinks roughly a 12 pack of beer daily.  Last drink this morning.  Denies previous seizures due to alcohol withdrawal though he states he does get "shaky".  Has had depressed mood but denies suicidal or homicidal ideation.  Was evaluated by behavioral health earlier today and was given outpatient resources. Past Medical History:  Diagnosis Date  . Anxiety   . EtOH dependence ([place])    QUIT AUG 8.  Marland Kitchen GERD without esophagitis   . Hepatitis C carrier ([place])   . Hypercholesteremia   . Hypertension   . Polysubstance dependence ([facility])    COCAINE-LAST DOSE AUG 8  . Stroke [facility])    has some ST memory deficits from stroke.    Patient Active Problem List   Diagnosis Date Noted  . Cocaine use disorder ([place])   . MDD (major depressive disorder), recurrent severe, without psychosis ([place]) 06/23/2017  . Normocytic anemia   . Lipoma of right forearm 12/29/2016  . Chronic hepatitis C virus infection ([place]) G1a 12/28/2016  . GERD (gastroesophageal reflux disease) 12/10/2016  . Transaminitis 12/10/2016  . Colon cancer screening 12/10/2016  . Substance induced mood disorder ([place]) 11/05/2016  . Alcohol use disorder, severe, dependence ([place])     Past Surgical History:  Procedure Laterality Date  . ANEURYSM COILING     AGE 65 x2  . APPENDECTOMY    . BIOPSY  01/05/2017   Procedure: BIOPSY;  Surgeon: Danie Binder, MD;  [facility];  Service: Endoscopy;;  Duodenal and Gastric  . COLONOSCOPY WITH PROPOFOL N/A 01/05/2017   Procedure: COLONOSCOPY WITH PROPOFOL;  Surgeon: Danie Binder, MD;  Location: [facility];  Service: Endoscopy;  Laterality:  N/A;  10:00am  . ESOPHAGOGASTRODUODENOSCOPY (EGD) WITH PROPOFOL N/A 01/05/2017   Procedure: ESOPHAGOGASTRODUODENOSCOPY (EGD) WITH PROPOFOL;  Surgeon: Danie Binder, MD;  Location: [facility];  Service: Endoscopy;  Laterality: N/A;  . LAPAROSCOPIC APPENDECTOMY    . MASS EXCISION Right 01/13/2017   Procedure: EXCISION LIPOMA RIGHT ARM;  Surgeon: Virl Cagey, MD;  Location: [facility] ORS;  Service: General;  Laterality: Right;        Home Medications    Prior to Admission medications   Medication Sig Start Date End Date Taking? Authorizing Provider  amLODipine (NORVASC) 10 MG tablet Take 1 tablet (10 mg total) by mouth daily. For high blood pressure 06/29/17   Money, Lowry Ram, FNP  chlordiazePOXIDE (LIBRIUM) 25 MG capsule 50mg  PO TID x 1D, then 25-50mg  PO BID X 1D, then 25-50mg  PO QD X 1D 08/10/18   Julianne Rice, MD  FLUoxetine (PROZAC) 20 MG capsule Take 1 capsule (20 mg total) by mouth daily. For mood control 06/29/17   Money, Lowry Ram, FNP  hydrOXYzine (ATARAX/VISTARIL) 25 MG tablet Take 1 tablet (25 mg total) by mouth 3 (three) times daily as needed for anxiety. 06/28/17   Money, Lowry Ram, FNP  metoprolol tartrate (LOPRESSOR) 25 MG tablet Take 0.5 tablets (12.5 mg total) by mouth 2 (two) times daily. For high blood pressure 06/28/17   Money, Lowry Ram, FNP  traZODone (DESYREL) 50 MG tablet Take  1 tablet (50 mg total) by mouth at bedtime as needed for sleep. 06/28/17   Money, Lowry Ram, FNP    Family History Family History  Problem Relation Age of Onset  . Stomach cancer Mother   . Bone cancer Sister 28  . Colon cancer Brother 18  . Colon polyps Neg Hx     Social History Social History   Tobacco Use  . Smoking status: Current Every Day Smoker    Packs/day: 0.25    Years: 25.00    Pack years: 6.25    Types: Cigarettes  . Smokeless tobacco: Never Used  Substance Use Topics  . Alcohol use: Yes    Comment: daily usage past 2 months  . Drug use: Yes    Types: "Crack" cocaine     Comment: last positive 10/2016. ssta used last 2 months ago.     Allergies   Patient has no known allergies.   Review of Systems Review of Systems  Constitutional: Negative for chills and fever.  Eyes: Negative for visual disturbance.  Respiratory: Negative for shortness of breath.   Cardiovascular: Negative for chest pain.  Gastrointestinal: Negative for abdominal pain, nausea and vomiting.  Musculoskeletal: Negative for back pain, myalgias and neck pain.  Skin: Negative for rash and wound.  Neurological: Negative for seizures, syncope, weakness, numbness and headaches.  Psychiatric/Behavioral: Positive for dysphoric mood. Negative for suicidal ideas.  All other systems reviewed and are negative.    Physical Exam Updated Vital Signs BP (!) 149/111 (BP Location: Left Arm)   Pulse 73   Temp 98.5 F (36.9 C)   Resp 15   Ht 5' 4.5" (1.638 m)   Wt 59 kg   SpO2 100%   BMI 21.97 kg/m   Physical Exam Vitals signs and nursing note reviewed.  Constitutional:      General: He is not in acute distress.    Appearance: Normal appearance. He is well-developed. He is not ill-appearing.  HENT:     Head: Normocephalic and atraumatic.  Eyes:     Pupils: Pupils are equal, round, and reactive to light.  Neck:     Musculoskeletal: Normal range of motion and neck supple.  Cardiovascular:     Rate and Rhythm: Normal rate and regular rhythm.     Heart sounds: No murmur. No friction rub. No gallop.   Pulmonary:     Effort: Pulmonary effort is normal. No respiratory distress.     Breath sounds: Normal breath sounds. No stridor. No wheezing, rhonchi or rales.  Chest:     Chest wall: No tenderness.  Abdominal:     General: Bowel sounds are normal.     Palpations: Abdomen is soft.     Tenderness: There is no abdominal tenderness. There is no guarding or rebound.  Musculoskeletal: Normal range of motion.        General: No tenderness.  Skin:    General: Skin is warm and dry.      Findings: No erythema or rash.  Neurological:     General: No focal deficit present.     Mental Status: He is alert and oriented to person, place, and time.  Psychiatric:        Behavior: Behavior normal.     Comments: Depressed mood.  Denies SI/HI.  Not responding to internal stimuli.      ED Treatments / Results  Labs (all labs ordered are listed, but only abnormal results are displayed) Labs Reviewed - No data to display  EKG None  Radiology No results found.  Procedures Procedures (including critical care time)  Medications Ordered in ED Medications - No data to display   Initial Impression / Assessment and Plan / ED Course  I have reviewed the triage vital signs and the nursing notes.  Pertinent labs & imaging results that were available during my care of the patient were reviewed by me and considered in my medical decision making (see chart for details).        Reemphasized need for outpatient follow-up.  No evidence of active withdrawal.  Will give prescription for Librium taper.  Return precautions given.  Final Clinical Impressions(s) / ED Diagnoses   Final diagnoses:  Polysubstance abuse [facility])    ED Discharge Orders         Ordered    chlordiazePOXIDE (LIBRIUM) 25 MG capsule     08/10/18 1632           Julianne Rice, MD 08/10/18 [phone]

## 2018-08-10 NOTE — BH Assessment (Addendum)
Assessment Note  [name] is an 48 y.o. male.  The pt came in seeking detox.  He is using cocaine and alcohol.  He stated he is drinking a half a gallon of liquor in addition to drinking a couple of fifths a day.  The pt denies SI, HI and psychosis.  He last had 3 beers earlier today.  He is using about 100 dollars worth of crack cocaine per day.  He last used crack yesterday.  He isn't taking any medication currently and isn't seeing a counselor or psychiatrist.  The pt has been inpatient in 2018 and 2019 at [facility] and [facility].  The pt lives alone.  He is currently separated from his wife, but she is supportive.  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  He denies SI, self harm, HI, legal issues, history of abuse, and hallucinations.  He is sleeping about 3 hours a night and has a poor appetite.    Pt is dressed in casual clothes. He is alert and oriented x4. Pt speaks in a clear tone, at moderate volume and normal pace. Eye contact is good. Pt's mood is depressed. Thought process is coherent and relevant. There is no indication Pt is currently responding to internal stimuli or experiencing delusional thought content.?Pt was cooperative throughout assessment.     Diagnosis: F33.1 Major depressive disorder, Recurrent episode, Moderate F10.20 Alcohol use disorder, Severe F14.20 Cocaine use disorder, Severe  Past Medical History:  Past Medical History:  Diagnosis Date  . Anxiety   . EtOH dependence ([place])    QUIT AUG 8.  Marland Kitchen GERD without esophagitis   . Hepatitis C carrier ([place])   . Hypercholesteremia   . Hypertension   . Polysubstance dependence ([facility])    COCAINE-LAST DOSE AUG 8  . Stroke [facility])    has some ST memory deficits from stroke.    Past Surgical History:  Procedure Laterality Date  . ANEURYSM COILING     AGE 20 x2  . APPENDECTOMY    . BIOPSY  01/05/2017   Procedure: BIOPSY;  Surgeon: Sergio Binder, MD;  [facility];   Service: Endoscopy;;  Duodenal and Gastric  . COLONOSCOPY WITH PROPOFOL N/A 01/05/2017   Procedure: COLONOSCOPY WITH PROPOFOL;  Surgeon: Sergio Binder, MD;  [facility];  Service: Endoscopy;  Laterality: N/A;  10:00am  . ESOPHAGOGASTRODUODENOSCOPY (EGD) WITH PROPOFOL N/A 01/05/2017   Procedure: ESOPHAGOGASTRODUODENOSCOPY (EGD) WITH PROPOFOL;  Surgeon: Sergio Binder, MD;  [facility];  Service: Endoscopy;  Laterality: N/A;  . LAPAROSCOPIC APPENDECTOMY    . MASS EXCISION Right 01/13/2017   Procedure: EXCISION LIPOMA RIGHT ARM;  Surgeon: Sergio Cagey, MD;  Location: [facility] ORS;  Service: General;  Laterality: Right;    Family History:  Family History  Problem Relation Age of Onset  . Stomach cancer Mother   . Bone cancer Sister 52  . Colon cancer Brother 30  . Colon polyps Neg Hx     Social History:  reports that he has been smoking cigarettes. He has a 6.25 pack-year smoking history. He has never used smokeless tobacco. He reports current alcohol use. He reports current drug use. Drug: "Crack" cocaine.  Additional Social History:  Alcohol / Drug Use Pain Medications: See MAR Prescriptions: See MAR Over the Counter: See MAR History of alcohol / drug use?: Yes Longest period of sobriety (when/how long): 4 years Negative Consequences of Use:  Personal relationships Substance #1 Name of Substance 1: alcohol 1 - Age of First Use: 14 1 - Amount (size/oz): half a gallon of liquor plus a couple fifths of liquor 1 - Frequency: daily 1 - Last Use / Amount: 08/10/2018 Substance #2 Name of Substance 2: crack cocaine 2 - Age of First Use: early 20's 2 - Amount (size/oz): 100 dollars worth 2 - Frequency: daily 2 - Last Use / Amount: 08/09/2018  CIWA: CIWA-Ar BP: 138/86 Pulse Rate: 85 COWS:    Allergies: No Known Allergies  Home Medications: (Not in a hospital admission)   OB/GYN Status:  No LMP for male patient.  General Assessment Data Location of  Assessment: [facility] Assessment Services TTS Assessment: In system Is this a Tele or Face-to-Face Assessment?: Face-to-Face Is this an Initial Assessment or a Re-assessment for this encounter?: Initial Assessment Patient Accompanied by:: N/A Language Other than English: No Living Arrangements: Other (Comment)(home) What gender do you identify as?: Male Marital status: Separated Living Arrangements: Alone Can pt return to current living arrangement?: Yes Admission Status: Voluntary Is patient capable of signing voluntary admission?: Yes Referral Source: Self/Family/Friend Insurance type: VA     Crisis Care Plan Living Arrangements: Alone Legal Guardian: Other:(Self) Name of Psychiatrist: none Name of Therapist: none     Risk to self with the past 6 months Suicidal Ideation: No Has patient been a risk to self within the past 6 months prior to admission? : No Suicidal Intent: No Has patient had any suicidal intent within the past 6 months prior to admission? : No Is patient at risk for suicide?: No Suicidal Plan?: No Has patient had any suicidal plan within the past 6 months prior to admission? : No Access to Means: No What has been your use of drugs/alcohol within the last 12 months?: crack and alcohol use Previous Attempts/Gestures: No How many times?: 0 Other Self Harm Risks: none Triggers for Past Attempts: None known Intentional Self Injurious Behavior: None Family Suicide History: No Recent stressful life event(s): Conflict (Comment)(problems with wife) Persecutory voices/beliefs?: No Depression: Yes Depression Symptoms: Despondent, Insomnia, Tearfulness, Loss of interest in usual pleasures Substance abuse history and/or treatment for substance abuse?: Yes Suicide prevention information given to non-admitted patients: Not applicable  Risk to Others within the past 6 months Homicidal Ideation: No Does patient have any lifetime risk of violence toward others beyond the  six months prior to admission? : No Thoughts of Harm to Others: No Current Homicidal Intent: No Current Homicidal Plan: No Access to Homicidal Means: No Identified Victim: pt denies History of harm to others?: No Assessment of Violence: None Noted Violent Behavior Description: pt denies Does patient have access to weapons?: No Criminal Charges Pending?: No Does patient have a court date: No Is patient on probation?: No  Psychosis Hallucinations: None noted Delusions: None noted  Mental Status Report Appearance/Hygiene: Unremarkable Eye Contact: Good Motor Activity: Freedom of movement, Unremarkable Speech: Logical/coherent Level of Consciousness: Alert Mood: Depressed Affect: Depressed Anxiety Level: None Thought Processes: Coherent, Relevant Judgement: Partial Orientation: Person, Time, Place, Situation Obsessive Compulsive Thoughts/Behaviors: None  Cognitive Functioning Concentration: Normal Memory: Recent Intact, Remote Intact Is patient IDD: No Insight: Fair Impulse Control: Good Appetite: Poor Have you had any weight changes? : No Change Sleep: Decreased Total Hours of Sleep: 3 Vegetative Symptoms: None  ADLScreening [facility] Assessment Services) Patient's cognitive ability adequate to safely complete daily activities?: Yes Patient able to express need for assistance with ADLs?: Yes Independently performs ADLs?: Yes (appropriate for  developmental age)  Prior Inpatient Therapy Prior Inpatient Therapy: Yes Prior Therapy Dates: 05/2017, 10/2016 Prior Therapy Facilty/Provider(s): [facility], [place] Reason for Treatment: SA  Prior Outpatient Therapy Prior Outpatient Therapy: No Does patient have an ACCT team?: No Does patient have Intensive In-House Services?  : No Does patient have [facility] services? : No Does patient have P4CC services?: No  ADL Screening (condition at time of admission) Patient's cognitive ability adequate to safely complete daily  activities?: Yes Patient able to express need for assistance with ADLs?: Yes Independently performs ADLs?: Yes (appropriate for developmental age)       Abuse/Neglect Assessment (Assessment to be complete while patient is alone) Abuse/Neglect Assessment Can Be Completed: Yes Physical Abuse: Denies Verbal Abuse: Denies Sexual Abuse: Denies Exploitation of patient/patient's resources: Denies Self-Neglect: Denies Values / Beliefs Cultural Requests During Hospitalization: None Spiritual Requests During Hospitalization: None Consults Spiritual Care Consult Needed: No Social Work Consult Needed: No            Disposition:  Disposition Initial Assessment Completed for this Encounter: Yes Disposition of Patient: Discharge   NP Shuvon Rankin recommends the pt be discharged and to follow up with detox facilities.  On Site Evaluation by:   Reviewed with Physician:    Enzo Montgomery 08/10/2018 3:42 PM

## 2018-08-10 NOTE — ED Triage Notes (Signed)
Pt reports depression and anxiety but denies SI/HI. Pt reports he relapsed on cocaine and alcohol after having marital issues. Pt reported he wants detox. Pt's BP is also elevated.

## 2018-08-10 NOTE — H&P (Signed)
Behavioral Health Medical Screening Exam  [name] is an 48 y.o. male patient presents to [facility] as walk in with complaints of alcohol and drug use problem seeking detox.  Patient denies suicidal/homicidal ideation, psychosis, and paranoia.  Patient states that he is only seeking detox.   During assessment patient is alert/oriented x 4; calm and cooperative.  Mood congruent with affect.  Patient does not appear to be responding to internal or external stimuli; and denied suicidal/homicidal ideation, psychosis, and paranoia  Total Time spent with patient: 45 minutes  Psychiatric Specialty Exam: Physical Exam  Vitals reviewed. Constitutional: He is oriented to person, place, and time. He appears well-developed and well-nourished. No distress.  Neck: Normal range of motion. Neck supple.  Respiratory: Effort normal.  Musculoskeletal: Normal range of motion.  Neurological: He is alert and oriented to person, place, and time.  Skin: Skin is warm and dry.  Psychiatric: His speech is normal and behavior is normal. Judgment and thought content normal. Cognition and memory are normal. Depressed: Stable.    Review of Systems  Neurological: Seizures: Denies history of seizures related to alcohol withdrawl.  Psychiatric/Behavioral: Positive for substance abuse (Polysubstance; mainly alcohol daily). Depression: Stable. Hallucinations: Denies. Memory loss: Denies. Suicidal ideas: Denies. Nervous/anxious: Denies. Insomnia: Denies.        Patient denies history of DT's but states that he has blacked out on occasion related to alcohol   All other systems reviewed and are negative.   Blood pressure 138/86, pulse 85, temperature 98.6 F (37 C), temperature source Oral, resp. rate 16, SpO2 100 %.There is no height or weight on file to calculate BMI.  General Appearance: Casual  Eye Contact:  Good  Speech:  Clear and Coherent and Normal Rate  Volume:  Normal  Mood:  Appropriate  Affect:   Appropriate and Congruent  Thought Process:  Coherent and Goal Directed  Orientation:  Full (Time, Place, and Person)  Thought Content:  WDL  Suicidal Thoughts:  No  Homicidal Thoughts:  No  Memory:  Immediate;   Good Recent;   Good Remote;   Good  Judgement:  Intact  Insight:  Good  Psychomotor Activity:  Normal  Concentration: Concentration: Good and Attention Span: Good  Recall:  Good  Fund of Knowledge:Good  Language: Good  Akathisia:  No  Handed:  Right  AIMS (if indicated):     Assets:  Communication Skills Desire for Improvement Housing Physical Health Social Support  Sleep:       Musculoskeletal: Strength & Muscle Tone: within normal limits Gait & Station: normal Patient leans: N/A  Blood pressure 138/86, pulse 85, temperature 98.6 F (37 C), temperature source Oral, resp. rate 16, SpO2 100 %.  Recommendations:  Outpatient psychiatric services for substance use disorder.  Resources and referral given to patient.    Disposition: No evidence of imminent risk to self or others at present.   Patient does not meet criteria for psychiatric inpatient admission. Supportive therapy provided about ongoing stressors. Discussed crisis plan, support from social network, calling 911, coming to the Emergency Department, and calling Suicide Hotline.  Based on my evaluation the patient does not appear to have an emergency medical condition.  Shuvon Rankin, NP 08/10/2018, 3:35 PM

## 2018-08-27 IMAGING — NM NM MYOCAR MULTI W/SPECT W/WALL MOTION & EF
2 series · 12 of 12 positions shown · non-contrast
Comparison: none

[Series 1: rest · 6.51mm/px · 6 of 64 frames shown]
[frame 6/64]
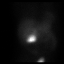
[frame 16/64]
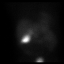
[frame 27/64]
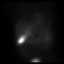
[frame 38/64]
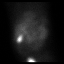
[frame 48/64]
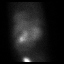
[frame 59/64]
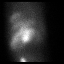

[Series 2: stress gated · 6.51mm/px · 6 of 64 frames shown]
[frame 6/64]
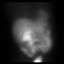
[frame 16/64]
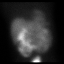
[frame 27/64]
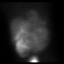
[frame 38/64]
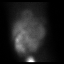
[frame 48/64]
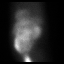
[frame 59/64]
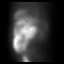

[12 of 12 positions shown; findings below may reference images not displayed]

Canned report from images found in remote index.

Refer to host system for actual result text.

## 2022-10-28 ENCOUNTER — Ambulatory Visit
Admission: EM | Admit: 2022-10-28 | Discharge: 2022-10-28 | Disposition: A | Discharge status: Home / Self Care | Payer: Self-pay | Attending: Family Medicine | Admitting: Family Medicine

## 2022-10-28 ENCOUNTER — Encounter: Payer: Self-pay | Admitting: Emergency Medicine

## 2022-10-28 DIAGNOSIS — H66002 Acute suppurative otitis media without spontaneous rupture of ear drum, left ear: Secondary | ICD-10-CM

## 2022-10-28 DIAGNOSIS — H6592 Unspecified nonsuppurative otitis media, left ear: Secondary | ICD-10-CM

## 2022-10-28 MED ORDER — PREDNISONE 50 MG PO TABS: ORAL_TABLET | ORAL | 0 refills | Status: DC

## 2022-10-28 MED ORDER — FLUTICASONE PROPIONATE 50 MCG/ACT NA SUSP: 1.0000 | Freq: Two times a day (BID) | NASAL | 2 refills | Status: DC

## 2022-10-28 MED ORDER — AMOXICILLIN 875 MG PO TABS: 875.0000 mg | ORAL_TABLET | Freq: Two times a day (BID) | ORAL | 0 refills | Status: DC

## 2022-10-28 NOTE — ED Triage Notes (Signed)
Left ear pain and pain going down in throat on left side x 1 month.

## 2022-10-28 NOTE — ED Provider Notes (Signed)
[facility]    CSN: [id number] Arrival date & time: 10/28/22  1448      History   Chief Complaint No chief complaint on file.   HPI [name] is a 52 y.o. male.   Presenting today with 1 month history of left ear pain, popping with chewing, radiation of pain down side of neck and throat.  Denies fever, chills, cough, chest pain, shortness of breath, abdominal pain, nausea vomiting or diarrhea.  Trying over-the-counter pain relievers, Sudafed with no relief.  Denies any history of seasonal allergies.    Past Medical History:  Diagnosis Date   Anxiety    EtOH dependence (HCC)    QUIT AUG 8.   GERD without esophagitis    Hepatitis C carrier (HCC)    Hypercholesteremia    Hypertension    Polysubstance dependence (HCC)    COCAINE-LAST DOSE AUG 8   Stroke [facility])    has some ST memory deficits from stroke.    Patient Active Problem List   Diagnosis Date Noted   Cocaine use disorder (HCC)    MDD (major depressive disorder), recurrent severe, without psychosis (HCC) 06/23/2017   Normocytic anemia    Lipoma of right forearm 12/29/2016   Chronic hepatitis C virus infection (HCC) G1a 12/28/2016   GERD (gastroesophageal reflux disease) 12/10/2016   Transaminitis 12/10/2016   Colon cancer screening 12/10/2016   Substance induced mood disorder (HCC) 11/05/2016   Alcohol use disorder, severe, dependence (HCC)     Past Surgical History:  Procedure Laterality Date   ANEURYSM COILING     AGE 25 x2   APPENDECTOMY     BIOPSY  01/05/2017   Procedure: BIOPSY;  Surgeon: West Bali, MD;  [facility];  Service: Endoscopy;;  Duodenal and Gastric   COLONOSCOPY WITH PROPOFOL N/A 01/05/2017   Procedure: COLONOSCOPY WITH PROPOFOL;  Surgeon: West Bali, MD;  [facility];  Service: Endoscopy;  Laterality: N/A;  10:00am   ESOPHAGOGASTRODUODENOSCOPY (EGD) WITH PROPOFOL N/A 01/05/2017   Procedure: ESOPHAGOGASTRODUODENOSCOPY (EGD) WITH PROPOFOL;   Surgeon: West Bali, MD;  [facility];  Service: Endoscopy;  Laterality: N/A;   LAPAROSCOPIC APPENDECTOMY     MASS EXCISION Right 01/13/2017   Procedure: EXCISION LIPOMA RIGHT ARM;  Surgeon: Lucretia Roers, MD;  Location: [facility];  Service: General;  Laterality: Right;       Home Medications    Prior to Admission medications   Medication Sig Start Date End Date Taking? Authorizing Provider  amoxicillin (AMOXIL) 875 MG tablet Take 1 tablet (875 mg total) by mouth 2 (two) times daily. 10/28/22  Yes Particia Nearing, PA-C  fluticasone [facility]) 50 MCG/ACT nasal spray Place 1 spray into both nostrils 2 (two) times daily. 10/28/22  Yes Particia Nearing, PA-C  predniSONE (DELTASONE) 50 MG tablet Take 1 tab daily with breakfast for 3 days 10/28/22  Yes Particia Nearing, PA-C  amLODipine (NORVASC) 10 MG tablet Take 1 tablet (10 mg total) by mouth daily. For high blood pressure 06/29/17   Money, Gerlene Burdock, FNP  chlordiazePOXIDE (LIBRIUM) 25 MG capsule 50mg  PO TID x 1D, then 25-50mg  PO BID X 1D, then 25-50mg  PO QD X 1D 08/10/18   Loren Racer, MD  FLUoxetine (PROZAC) 20 MG capsule Take 1 capsule (20 mg total) by mouth daily. For mood control 06/29/17   Money, Feliz Beam B, FNP  hydrOXYzine (ATARAX/VISTARIL) 25 MG tablet Take 1 tablet (25 mg total) by mouth 3 (  three) times daily as needed for anxiety. 06/28/17   Money, Gerlene Burdock, FNP  metoprolol tartrate (LOPRESSOR) 25 MG tablet Take 0.5 tablets (12.5 mg total) by mouth 2 (two) times daily. For high blood pressure 06/28/17   Money, Gerlene Burdock, FNP    Family History Family History  Problem Relation Age of Onset   Stomach cancer Mother    Bone cancer Sister 15   Colon cancer Brother 42   Colon polyps Neg Hx     Social History Social History   Tobacco Use   Smoking status: Every Day    Current packs/day: 0.25    Average packs/day: 0.3 packs/day for 25.0 years (6.3 ttl pk-yrs)    Types: Cigarettes   Smokeless tobacco:  Never  Vaping Use   Vaping status: Every Day   Substances: Nicotine  Substance Use Topics   Alcohol use: Yes    Comment: daily usage past 2 months   Drug use: Not Currently    Types: "Crack" cocaine    Comment: last positive 10/2016. ssta used last 2 months ago.     Allergies   Patient has no known allergies.   Review of Systems Review of Systems Per HPI  Physical Exam Triage Vital Signs ED Triage Vitals [10/28/22 1452]  Encounter Vitals Group     BP (!) 145/84     Systolic BP Percentile      Diastolic BP Percentile      Pulse Rate 87     Resp 16     Temp 99 F (37.2 C)     Temp Source Oral     SpO2 95 %     Weight      Height      Head Circumference      Peak Flow      Pain Score 7     Pain Loc      Pain Education      Exclude from Growth Chart    No data found.  Updated Vital Signs BP (!) 145/84 (BP Location: Right Arm)   Pulse 87   Temp 99 F (37.2 C) (Oral)   Resp 16   SpO2 95%   Visual Acuity Right Eye Distance:   Left Eye Distance:   Bilateral Distance:    Right Eye Near:   Left Eye Near:    Bilateral Near:     Physical Exam Vitals and nursing note reviewed.  Constitutional:      Appearance: Normal appearance.  HENT:     Head: Atraumatic.     Ears:     Comments: Bilateral middle ear effusion, with left TM erythematous, bulging    Nose: Nose normal.     Mouth/Throat:     Mouth: Mucous membranes are moist.     Pharynx: Oropharynx is clear.  Eyes:     Extraocular Movements: Extraocular movements intact.     Conjunctiva/sclera: Conjunctivae normal.  Cardiovascular:     Rate and Rhythm: Normal rate and regular rhythm.  Pulmonary:     Effort: Pulmonary effort is normal.     Breath sounds: Normal breath sounds.  Musculoskeletal:        General: Normal range of motion.     Cervical back: Normal range of motion and neck supple.  Skin:    General: Skin is warm and dry.  Neurological:     General: No focal deficit present.     Mental  Status: He is oriented to person, place, and time.  Psychiatric:  Mood and Affect: Mood normal.        Thought Content: Thought content normal.        Judgment: Judgment normal.      UC Treatments / Results  Labs (all labs ordered are listed, but only abnormal results are displayed) Labs Reviewed - No data to display  EKG   Radiology No results found.  Procedures Procedures (including critical care time)  Medications Ordered in UC Medications - No data to display  Initial Impression / Assessment and Plan / UC Course  I have reviewed the triage vital signs and the nursing notes.  Pertinent labs & imaging results that were available during my care of the patient were reviewed by me and considered in my medical decision making (see chart for details).     Suspect ongoing middle ear effusion now with left ear infection.  Treat with prednisone, Flonase, Amoxil, supportive over-the-counter medications and home care.  Return for worsening symptoms.  Final Clinical Impressions(s) / UC Diagnoses   Final diagnoses:  Middle ear effusion, left  Acute suppurative otitis media of left ear without spontaneous rupture of tympanic membrane, recurrence not specified   Discharge Instructions   None    ED Prescriptions     Medication Sig Dispense Auth. Provider   predniSONE (DELTASONE) 50 MG tablet Take 1 tab daily with breakfast for 3 days 3 tablet Particia Nearing, PA-C   fluticasone [facility]) 50 MCG/ACT nasal spray Place 1 spray into both nostrils 2 (two) times daily. 16 g Particia Nearing, [place]   amoxicillin (AMOXIL) 875 MG tablet Take 1 tablet (875 mg total) by mouth 2 (two) times daily. 20 tablet Particia Nearing, [place]      PDMP not reviewed this encounter.   Particia Nearing, [place] 10/28/22 [phone]

## 2023-01-11 ENCOUNTER — Telehealth: Payer: Self-pay | Admitting: Internal Medicine

## 2023-01-11 NOTE — Telephone Encounter (Signed)
Needs new patient approval

## 2023-01-12 NOTE — Telephone Encounter (Signed)
scheduled

## 2023-01-21 ENCOUNTER — Encounter (INDEPENDENT_AMBULATORY_CARE_PROVIDER_SITE_OTHER): Payer: Self-pay | Admitting: Otolaryngology

## 2023-02-04 ENCOUNTER — Ambulatory Visit (INDEPENDENT_AMBULATORY_CARE_PROVIDER_SITE_OTHER): Payer: Medicare Other | Admitting: Internal Medicine

## 2023-02-04 ENCOUNTER — Encounter: Payer: Self-pay | Admitting: Internal Medicine

## 2023-02-04 VITALS — BP 154/88 | HR 76 | Ht 64.5 in | Wt 149.6 lb

## 2023-02-04 DIAGNOSIS — F411 Generalized anxiety disorder: Secondary | ICD-10-CM | POA: Insufficient documentation

## 2023-02-04 DIAGNOSIS — I10 Essential (primary) hypertension: Secondary | ICD-10-CM | POA: Diagnosis not present

## 2023-02-04 DIAGNOSIS — B182 Chronic viral hepatitis C: Secondary | ICD-10-CM

## 2023-02-04 DIAGNOSIS — H6690 Otitis media, unspecified, unspecified ear: Secondary | ICD-10-CM

## 2023-02-04 DIAGNOSIS — E559 Vitamin D deficiency, unspecified: Secondary | ICD-10-CM

## 2023-02-04 DIAGNOSIS — Z23 Encounter for immunization: Secondary | ICD-10-CM

## 2023-02-04 DIAGNOSIS — Z114 Encounter for screening for human immunodeficiency virus [HIV]: Secondary | ICD-10-CM

## 2023-02-04 DIAGNOSIS — F332 Major depressive disorder, recurrent severe without psychotic features: Secondary | ICD-10-CM

## 2023-02-04 DIAGNOSIS — R739 Hyperglycemia, unspecified: Secondary | ICD-10-CM

## 2023-02-04 DIAGNOSIS — K219 Gastro-esophageal reflux disease without esophagitis: Secondary | ICD-10-CM

## 2023-02-04 DIAGNOSIS — E782 Mixed hyperlipidemia: Secondary | ICD-10-CM

## 2023-02-04 DIAGNOSIS — H65192 Other acute nonsuppurative otitis media, left ear: Secondary | ICD-10-CM

## 2023-02-04 DIAGNOSIS — F1020 Alcohol dependence, uncomplicated: Secondary | ICD-10-CM

## 2023-02-04 HISTORY — DX: Otitis media, unspecified, unspecified ear: H66.90

## 2023-02-04 MED ORDER — METOPROLOL SUCCINATE ER 25 MG PO TB24: 25.0000 mg | ORAL_TABLET | Freq: Every day | ORAL | 1 refills | Status: DC

## 2023-02-04 MED ORDER — FAMOTIDINE 40 MG PO TABS: 40.0000 mg | ORAL_TABLET | Freq: Every day | ORAL | 3 refills | Status: DC

## 2023-02-04 MED ORDER — HYDROXYZINE HCL 25 MG PO TABS
25.0000 mg | ORAL_TABLET | Freq: Three times a day (TID) | ORAL | 1 refills | Status: DC | PRN
Start: 2023-02-04 — End: 2023-12-14

## 2023-02-04 MED ORDER — FLUOXETINE HCL 20 MG PO CAPS
20.0000 mg | ORAL_CAPSULE | Freq: Every day | ORAL | 3 refills | Status: DC
Start: 2023-02-04 — End: 2023-03-29

## 2023-02-04 MED ORDER — AZITHROMYCIN 250 MG PO TABS
ORAL_TABLET | ORAL | 0 refills | Status: AC
Start: 2023-02-04 — End: 2023-02-09

## 2023-02-04 MED ORDER — AMLODIPINE-OLMESARTAN 10-20 MG PO TABS
1.0000 | ORAL_TABLET | Freq: Every day | ORAL | 1 refills | Status: DC
Start: 2023-02-04 — End: 2023-05-10

## 2023-02-04 NOTE — Assessment & Plan Note (Signed)
Has quit alcohol - hard liquors now Reports taking beers occasionally Check CMP

## 2023-02-04 NOTE — Addendum Note (Signed)
Addended byTrena Platt on: 02/04/2023 05:46 PM   Modules accepted: Level of Service

## 2023-02-04 NOTE — Assessment & Plan Note (Signed)
Uncontrolled as he has run out of his medications Restart Prozac 20 mg QD Refilled hydroxyzine 25 mg 3 times daily as needed Referred to psychiatry - [facility] clinic in [place]

## 2023-02-04 NOTE — Patient Instructions (Signed)
Please start taking amlodipine-Olmesartan 10-20 mg once daily and Metoprolol succinate 25 mg once daily.  Please start taking Azithromycin for ear infection.  Please start taking Famotidine for acid reflux.  Start taking Prozac and as needed Hydroxyzine for anxiety.  Please try to cut down -> quit smoking.

## 2023-02-04 NOTE — Assessment & Plan Note (Signed)
Uncontrolled Start Prozac 20 mg QD

## 2023-02-04 NOTE — Assessment & Plan Note (Signed)
Was treated treated with Harvoni in the past Did not get hep C viral DNA at the end of treatment Check hep C viral DNA with reflex genotype

## 2023-02-04 NOTE — Assessment & Plan Note (Signed)
Left ear pain and mid ear effusion Started empiric azithromycin If persistent symptoms, will need ENT evaluation

## 2023-02-04 NOTE — Assessment & Plan Note (Signed)
BP Readings from Last 1 Encounters:  02/04/23 (!) 154/88   Uncontrolled with amlodipine 10 mg QD and metoprolol tartrate 25 mg once daily Switched to Azor 10-20 mg once daily and metoprolol succinate 25 mg QD Counseled for compliance with the medications Advised DASH diet and moderate exercise/walking, at least 150 mins/week

## 2023-02-04 NOTE — Assessment & Plan Note (Signed)
Started Pepcid 40 mg QD as his sore throat and cough could be due to GERD as well considering his history

## 2023-02-04 NOTE — Progress Notes (Signed)
New Patient Office Visit  Subjective:  Patient ID: [name], male    DOB: Sep 30, 1970  Age: 52 y.o. MRN: [id number]  CC:  Chief Complaint  Patient presents with   Establish Care    HPI [name] is a 52 y.o. male with past medical history of HTN, hep C (treated), GERD, MDD, GAD and substance abuse who presents for establishing care.  He recently moved from [place].  HTN: His blood pressure was elevated today.  He currently takes amlodipine 10 mg QD and metoprolol 25 mg QD.  He states that his blood pressure runs elevated at home as well.  He has mild generalized headache, but denies any chest pain, dyspnea or palpitations.  Chronic hepatitis C: Chart review suggests that he had hepatitis C in 2018, was followed by GI and was given Harvoni.  He has not followed up since then.  He used to drink alcohol daily and has cocaine abuse history, but has quit them for about 2 years.  Denies any chronic nausea or vomiting.  MDD and GAD: He used to see psychiatry in [facility] psychiatry.  He has run out of Prozac and hydroxyzine.  He experiences worsening of anxiety recently.  Denies any SI or HI currently.  He requests local psychiatric referral as he has moved in .  He also reports left ear pain, sore throat, hoarse voice and nasal congestion for the last 2 months.  He has dry cough as well.  He was given Augmentin and prednisone from urgent care in 07/24, which had improved his symptoms initially.  Currently denies any fever or chills.  Denies dyspnea or wheezing currently.    Past Medical History:  Diagnosis Date   Anxiety    Bipolar 1 disorder (HCC)    EtOH dependence (HCC)    QUIT   GERD without esophagitis    Hepatitis C carrier (HCC)    Hypercholesteremia    Hypertension    Polysubstance dependence (HCC)    COCAINE- quit   Stroke [facility])    has some ST memory deficits from stroke.    Past Surgical History:  Procedure Laterality Date   ANEURYSM COILING      AGE 41 x2   APPENDECTOMY     BIOPSY  01/05/2017   Procedure: BIOPSY;  Surgeon: West Bali, MD;  [facility];  Service: Endoscopy;;  Duodenal and Gastric   COLONOSCOPY WITH PROPOFOL N/A 01/05/2017   Procedure: COLONOSCOPY WITH PROPOFOL;  Surgeon: West Bali, MD;  [facility];  Service: Endoscopy;  Laterality: N/A;  10:00am   ESOPHAGOGASTRODUODENOSCOPY (EGD) WITH PROPOFOL N/A 01/05/2017   Procedure: ESOPHAGOGASTRODUODENOSCOPY (EGD) WITH PROPOFOL;  Surgeon: West Bali, MD;  [facility];  Service: Endoscopy;  Laterality: N/A;   LAPAROSCOPIC APPENDECTOMY     MASS EXCISION Right 01/13/2017   Procedure: EXCISION LIPOMA RIGHT ARM;  Surgeon: Lucretia Roers, MD;  Location: [facility];  Service: General;  Laterality: Right;    Family History  Problem Relation Age of Onset   Stomach cancer Mother    Bone cancer Sister 35   Colon cancer Brother 22   Colon polyps Neg Hx     Social History   Socioeconomic History   Marital status: Married    Spouse name: Not on file   Number of children: Not on file   Years of education: Not on file   Highest education level: Not on file  Occupational History  Not on file  Tobacco Use   Smoking status: Every Day    Current packs/day: 0.25    Average packs/day: 0.3 packs/day for 25.0 years (6.3 ttl pk-yrs)    Types: Cigarettes   Smokeless tobacco: Never  Vaping Use   Vaping status: Every Day   Substances: Nicotine  Substance and Sexual Activity   Alcohol use: Yes    Comment: daily usage past 2 months   Drug use: Not Currently    Types: "Crack" cocaine    Comment: last positive 10/2016. ssta used last 2 months ago.   Sexual activity: Yes    Birth control/protection: Condom  Other Topics Concern   Not on file  Social History Narrative   NOW ON SHORT TERM DISABILITY. 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   MARRIED-3 WITH WIFE AND 3 PREVIOUS.   Social Determinants of Health   Financial  Resource Strain: Not on file  Food Insecurity: Not on file  Transportation Needs: Not on file  Physical Activity: Not on file  Stress: Not on file  Social Connections: Not on file  Intimate Partner Violence: Not on file    ROS Review of Systems  Constitutional:  Negative for chills and fever.  HENT:  Negative for congestion and sore throat.   Eyes:  Negative for pain and discharge.  Respiratory:  Negative for cough and shortness of breath.   Cardiovascular:  Negative for chest pain and palpitations.  Gastrointestinal:  Negative for diarrhea, nausea and vomiting.  Endocrine: Negative for polydipsia and polyuria.  Genitourinary:  Negative for dysuria and hematuria.  Musculoskeletal:  Negative for neck pain and neck stiffness.  Skin:  Negative for rash.  Neurological:  Negative for dizziness, weakness, numbness and headaches.  Psychiatric/Behavioral:  Positive for sleep disturbance. Negative for agitation and behavioral problems. The patient is nervous/anxious.     Objective:   Today's Vitals: BP (!) 154/88 (BP Location: Right Arm)   Pulse 76   Ht 5' 4.5" (1.638 m)   Wt 149 lb 9.6 oz (67.9 kg)   SpO2 95%   BMI 25.28 kg/m   Physical Exam Vitals reviewed.  Constitutional:      General: He is not in acute distress.    Appearance: He is not diaphoretic.  HENT:     Head: Normocephalic and atraumatic.     Left Ear: A middle ear effusion is present. There is no impacted cerumen.     Nose: Nose normal.     Mouth/Throat:     Mouth: Mucous membranes are moist.  Eyes:     General: No scleral icterus.    Extraocular Movements: Extraocular movements intact.  Cardiovascular:     Rate and Rhythm: Normal rate and regular rhythm.     Heart sounds: Normal heart sounds. No murmur heard. Pulmonary:     Breath sounds: Normal breath sounds. No wheezing or rales.  Abdominal:     Palpations: Abdomen is soft.     Tenderness: There is no abdominal tenderness.  Musculoskeletal:      Cervical back: Neck supple. No tenderness.     Right lower leg: No edema.     Left lower leg: No edema.  Skin:    General: Skin is warm.     Findings: No rash.  Neurological:     General: No focal deficit present.     Mental Status: He is alert and oriented to person, place, and time.  Psychiatric:        Mood  and Affect: Mood normal.        Behavior: Behavior normal.     Assessment & Plan:   Problem List Items Addressed This Visit       Cardiovascular and Mediastinum   Essential hypertension - Primary    BP Readings from Last 1 Encounters:  02/04/23 (!) 154/88   Uncontrolled with amlodipine 10 mg QD and metoprolol tartrate 25 mg once daily Switched to Azor 10-20 mg once daily and metoprolol succinate 25 mg QD Counseled for compliance with the medications Advised DASH diet and moderate exercise/walking, at least 150 mins/week       Relevant Medications   amlodipine-olmesartan (AZOR) 10-20 MG tablet   metoprolol succinate (TOPROL-XL) 25 MG 24 hr tablet     Digestive   GERD (gastroesophageal reflux disease)    Started Pepcid 40 mg QD as his sore throat and cough could be due to GERD as well considering his history      Relevant Medications   famotidine (PEPCID) 40 MG tablet   Chronic hepatitis C virus infection (HCC) G1a    Was treated treated with Harvoni in the past Did not get hep C viral DNA at the end of treatment Check hep C viral DNA with reflex genotype      Relevant Medications   azithromycin (ZITHROMAX) 250 MG tablet   Other Relevant Orders   HCV RNA quant rflx ultra or genotyp     Nervous and Auditory   Otitis media    Left ear pain and mid ear effusion Started empiric azithromycin If persistent symptoms, will need ENT evaluation      Relevant Medications   azithromycin (ZITHROMAX) 250 MG tablet     Other   Alcohol use disorder, severe, dependence (HCC)    Has quit alcohol - hard liquors now Reports taking beers occasionally Check CMP       MDD (major depressive disorder), recurrent severe, without psychosis (HCC)    Uncontrolled Start Prozac 20 mg QD      Relevant Medications   FLUoxetine (PROZAC) 20 MG capsule   hydrOXYzine (ATARAX) 25 MG tablet   Other Relevant Orders   TSH   CMP14+EGFR   CBC with Differential/Platelet   Ambulatory referral to Psychiatry   GAD (generalized anxiety disorder)    Uncontrolled as he has run out of his medications Restart Prozac 20 mg QD Refilled hydroxyzine 25 mg 3 times daily as needed Referred to psychiatry - [facility] in [place]      Relevant Medications   FLUoxetine (PROZAC) 20 MG capsule   hydrOXYzine (ATARAX) 25 MG tablet   Other Relevant Orders   Ambulatory referral to Psychiatry   Other Visit Diagnoses     Screening for HIV (human immunodeficiency virus)       Relevant Orders   HIV antibody (with reflex)   Hyperglycemia       Relevant Orders   Hemoglobin A1c   Mixed hyperlipidemia       Relevant Medications   amlodipine-olmesartan (AZOR) 10-20 MG tablet   metoprolol succinate (TOPROL-XL) 25 MG 24 hr tablet   Other Relevant Orders   Lipid panel   Vitamin D deficiency       Relevant Orders   VITAMIN D 25 Hydroxy (Vit-D Deficiency, Fractures)   Encounter for immunization       Relevant Orders   Flu vaccine trivalent PF, 6mos and older(Flulaval,Afluria,Fluarix,Fluzone) (Completed)       Outpatient Encounter Medications as of 02/04/2023  Medication Sig  amlodipine-olmesartan (AZOR) 10-20 MG tablet Take 1 tablet by mouth daily.   azithromycin (ZITHROMAX) 250 MG tablet Take 2 tablets on day 1, then 1 tablet daily on days 2 through 5   famotidine (PEPCID) 40 MG tablet Take 1 tablet (40 mg total) by mouth daily.   fluticasone (FLONASE) 50 MCG/ACT nasal spray Place 1 spray into both nostrils 2 (two) times daily.   ibuprofen (ADVIL) 400 MG tablet Take by mouth.   metoprolol succinate (TOPROL-XL) 25 MG 24 hr tablet Take 1 tablet (25 mg total) by mouth daily.    [DISCONTINUED] amLODipine (NORVASC) 10 MG tablet Take 1 tablet (10 mg total) by mouth daily. For high blood pressure   [DISCONTINUED] chlordiazePOXIDE (LIBRIUM) 25 MG capsule 50mg  PO TID x 1D, then 25-50mg  PO BID X 1D, then 25-50mg  PO QD X 1D   [DISCONTINUED] FLUoxetine (PROZAC) 20 MG capsule Take 1 capsule (20 mg total) by mouth daily. For mood control   [DISCONTINUED] hydrOXYzine (ATARAX/VISTARIL) 25 MG tablet Take 1 tablet (25 mg total) by mouth 3 (three) times daily as needed for anxiety.   [DISCONTINUED] hydrOXYzine (VISTARIL) 50 MG capsule Take 50 mg by mouth 3 (three) times daily.   [DISCONTINUED] metoprolol tartrate (LOPRESSOR) 25 MG tablet Take 0.5 tablets (12.5 mg total) by mouth 2 (two) times daily. For high blood pressure   FLUoxetine (PROZAC) 20 MG capsule Take 1 capsule (20 mg total) by mouth daily.   hydrOXYzine (ATARAX) 25 MG tablet Take 1 tablet (25 mg total) by mouth 3 (three) times daily as needed for anxiety.   [DISCONTINUED] amoxicillin (AMOXIL) 875 MG tablet Take 1 tablet (875 mg total) by mouth 2 (two) times daily. (Patient not taking: Reported on 02/04/2023)   [DISCONTINUED] predniSONE (DELTASONE) 50 MG tablet Take 1 tab daily with breakfast for 3 days (Patient not taking: Reported on 02/04/2023)   No facility-administered encounter medications on file as of 02/04/2023.    Follow-up: Return in about 6 weeks (around 03/18/2023) for HTN and GAD.   [name], MD

## 2023-02-05 ENCOUNTER — Encounter (INDEPENDENT_AMBULATORY_CARE_PROVIDER_SITE_OTHER): Payer: Self-pay | Admitting: Otolaryngology

## 2023-02-06 LAB — CMP14+EGFR
ALT: 14 IU/L (ref 0–44)
AST: 19 [IU]/L (ref 0–40)
Albumin: 4.4 g/dL (ref 3.8–4.9)
Alkaline Phosphatase: 67 IU/L (ref 44–121)
BUN/Creatinine Ratio: 11 (ref 9–20)
BUN: 10 mg/dL (ref 6–24)
Bilirubin Total: 0.3 mg/dL (ref 0.0–1.2)
CO2: 24 mmol/L (ref 20–29)
Calcium: 9.4 mg/dL (ref 8.7–10.2)
Chloride: 103 mmol/L (ref 96–106)
Creatinine, Ser: 0.9 mg/dL (ref 0.76–1.27)
Globulin, Total: 2.9 g/dL (ref 1.5–4.5)
Glucose: 75 mg/dL (ref 70–99)
Potassium: 4.7 mmol/L (ref 3.5–5.2)
Sodium: 140 mmol/L (ref 134–144)
Total Protein: 7.3 g/dL (ref 6.0–8.5)
eGFR: 102 mL/min/{1.73_m2} (ref >59)

## 2023-02-06 LAB — CBC WITH DIFFERENTIAL/PLATELET
Basophils Absolute: 0 10*3/uL (ref 0.0–0.2)
Basos: 1 %
EOS (ABSOLUTE): 0.3 10*3/uL (ref 0.0–0.4)
Eos: 4 %
Hematocrit: 43.5 % (ref 37.5–51.0)
Hemoglobin: 14.5 g/dL (ref 13.0–17.7)
Immature Grans (Abs): 0 10*3/uL (ref 0.0–0.1)
Immature Granulocytes: 0 %
Lymphocytes Absolute: 2.4 10*3/uL (ref 0.7–3.1)
Lymphs: 37 %
MCH: 29.8 pg (ref 26.6–33.0)
MCHC: 33.3 g/dL (ref 31.5–35.7)
MCV: 89 fL (ref 79–97)
Monocytes Absolute: 0.8 10*3/uL (ref 0.1–0.9)
Monocytes: 12 %
Neutrophils Absolute: 3 10*3/uL (ref 1.4–7.0)
Neutrophils: 46 %
Platelets: 215 10*3/uL (ref 150–450)
RBC: 4.87 x10E6/uL (ref 4.14–5.80)
RDW: 14 % (ref 11.6–15.4)
WBC: 6.6 10*3/uL (ref 3.4–10.8)

## 2023-02-06 LAB — HIV ANTIBODY (ROUTINE TESTING W REFLEX): HIV Screen 4th Generation wRfx: NONREACTIVE

## 2023-02-06 LAB — VITAMIN D 25 HYDROXY (VIT D DEFICIENCY, FRACTURES): Vit D, 25-Hydroxy: 20.6 ng/mL — ABNORMAL LOW (ref 30.0–100.0)

## 2023-02-06 LAB — HCV RNA QUANT RFLX ULTRA OR GENOTYP: HCV Quant Baseline: NOT DETECTED [IU]/mL

## 2023-02-06 LAB — HEMOGLOBIN A1C
Est. average glucose Bld gHb Est-mCnc: 120 mg/dL
Hgb A1c MFr Bld: 5.8 % — ABNORMAL HIGH (ref 4.8–5.6)

## 2023-02-06 LAB — LIPID PANEL
Chol/HDL Ratio: 4.6 ratio (ref 0.0–5.0)
Cholesterol, Total: 185 mg/dL (ref 100–199)
HDL: 40 mg/dL (ref >39)
LDL Chol Calc (NIH): 78 mg/dL (ref 0–99)
Triglycerides: 422 mg/dL — ABNORMAL HIGH (ref 0–149)
VLDL Cholesterol Cal: 67 mg/dL — ABNORMAL HIGH (ref 5–40)

## 2023-02-06 LAB — TSH: TSH: 1.74 u[IU]/mL (ref 0.450–4.500)

## 2023-02-28 DIAGNOSIS — C801 Malignant (primary) neoplasm, unspecified: Secondary | ICD-10-CM

## 2023-02-28 HISTORY — DX: Malignant (primary) neoplasm, unspecified: C80.1

## 2023-03-10 ENCOUNTER — Encounter (INDEPENDENT_AMBULATORY_CARE_PROVIDER_SITE_OTHER): Payer: Self-pay

## 2023-03-10 ENCOUNTER — Ambulatory Visit (INDEPENDENT_AMBULATORY_CARE_PROVIDER_SITE_OTHER): Payer: Medicare Other

## 2023-03-10 VITALS — Ht 64.0 in | Wt 150.0 lb

## 2023-03-10 DIAGNOSIS — D380 Neoplasm of uncertain behavior of larynx: Secondary | ICD-10-CM

## 2023-03-10 DIAGNOSIS — R1312 Dysphagia, oropharyngeal phase: Secondary | ICD-10-CM | POA: Diagnosis not present

## 2023-03-10 DIAGNOSIS — H9202 Otalgia, left ear: Secondary | ICD-10-CM | POA: Diagnosis not present

## 2023-03-11 ENCOUNTER — Encounter (HOSPITAL_BASED_OUTPATIENT_CLINIC_OR_DEPARTMENT_OTHER): Payer: Self-pay | Admitting: Otolaryngology

## 2023-03-11 NOTE — Progress Notes (Signed)
Patient ID: [name], male   DOB: 06-Jan-1971, 52 y.o.   MRN: [id number]  CC: Left ear and throat pain  HPI:  [name] is a 52 y.o. male who presents today complaining of left ear and left throat pain for the past 3 months.  His symptoms started as a pressure sensation and pain in the left ear.  It has subsequently progressed to involve the left side of his throat.  He has also noted increasing swallowing difficulty.  He was recently diagnosed with sinus infection, and was treated with oral antibiotic, steroid, and Flonase.  However, he continues to be symptomatic.  He denies any change in his hearing.  He denies any environmental allergies.  He has no previous ENT surgery.  He has been smoking daily for the past 20+ years.  Past Medical History:  Diagnosis Date   Anxiety    Bipolar 1 disorder (HCC)    EtOH dependence (HCC)    QUIT   GERD without esophagitis    Hepatitis C carrier (HCC)    Hypercholesteremia    Hypertension    Polysubstance dependence (HCC)    COCAINE- quit   Stroke [facility])    has some ST memory deficits from stroke.    Past Surgical History:  Procedure Laterality Date   ANEURYSM COILING     AGE 82 x2   APPENDECTOMY     BIOPSY  01/05/2017   Procedure: BIOPSY;  Surgeon: West Bali, MD;  [facility]: [facility];  Service: Endoscopy;;  Duodenal and Gastric   COLONOSCOPY WITH PROPOFOL N/A 01/05/2017   Procedure: COLONOSCOPY WITH PROPOFOL;  Surgeon: West Bali, MD;  [facility];  Service: Endoscopy;  Laterality: N/A;  10:00am   ESOPHAGOGASTRODUODENOSCOPY (EGD) WITH PROPOFOL N/A 01/05/2017   Procedure: ESOPHAGOGASTRODUODENOSCOPY (EGD) WITH PROPOFOL;  Surgeon: West Bali, MD;  [facility];  Service: Endoscopy;  Laterality: N/A;   LAPAROSCOPIC APPENDECTOMY     MASS EXCISION Right 01/13/2017   Procedure: EXCISION LIPOMA RIGHT ARM;  Surgeon: Lucretia Roers, MD;  Location: [facility];  Service: General;  Laterality: Right;     Family History  Problem Relation Age of Onset   Stomach cancer Mother    Bone cancer Sister 1   Colon cancer Brother 77   Colon polyps Neg Hx     Social History:  reports that he has been smoking cigarettes. He has a 6.3 pack-year smoking history. He has never used smokeless tobacco. He reports current alcohol use. He reports that he does not currently use drugs after having used the following drugs: "Crack" cocaine.  Allergies: No Known Allergies  Prior to Admission medications   Medication Sig Start Date End Date Taking? Authorizing Provider  amlodipine-olmesartan (AZOR) 10-20 MG tablet Take 1 tablet by mouth daily. 02/04/23  Yes [name], MD  famotidine (PEPCID) 40 MG tablet Take 1 tablet (40 mg total) by mouth daily. 02/04/23  Yes [name], MD  FLUoxetine (PROZAC) 20 MG capsule Take 1 capsule (20 mg total) by mouth daily. 02/04/23  Yes [name], MD  fluticasone (FLONASE) 50 MCG/ACT nasal spray Place 1 spray into both nostrils 2 (two) times daily. 10/28/22  Yes Particia Nearing, PA-C  hydrOXYzine (ATARAX) 25 MG tablet Take 1 tablet (25 mg total) by mouth 3 (three) times daily as needed for anxiety. 02/04/23  Yes [name], MD  ibuprofen (ADVIL) 400 MG tablet Take by mouth. 10/02/19  Yes [provider]  metoprolol succinate (TOPROL-XL) 25 MG 24 hr tablet Take 1 tablet (25 mg total) by mouth daily. 02/04/23  Yes [name], MD    Height 5\' 4"  (1.626 m), weight 150 lb (68 kg). Exam: General: Communicates without difficulty, well nourished, no acute distress. Head: Normocephalic, no evidence injury, no tenderness, facial buttresses intact without stepoff. Face/sinus: No tenderness to palpation and percussion. Facial movement is normal and symmetric. Eyes: PERRL, EOMI. No scleral icterus, conjunctivae clear. Neuro: CN II exam reveals vision grossly intact.  No nystagmus at any point of gaze. Ears: Auricles well formed without lesions.  Ear canals  are intact without mass or lesion.  No erythema or edema is appreciated.  The TMs are intact without fluid. Nose: External evaluation reveals normal support and skin without lesions.  Dorsum is intact.  Anterior rhinoscopy reveals congested mucosa over anterior aspect of inferior turbinates and intact septum.  No purulence noted. Oral:  Oral cavity and oropharynx are intact, symmetric, without erythema or edema.  Mucosa is moist without lesions. Neck: Full range of motion without pain. Thyroid bed within normal limits to palpation.  Parotid glands and submandibular glands equal bilaterally without mass.  Trachea is midline. Neuro:  CN 2-12 grossly intact.  Procedure:  Flexible Fiberoptic Laryngoscopy Anesthesia: None Description: Risks, benefits, and alternatives of flexible endoscopy were explained to the patient. Specific mention was made of the risk of throat numbness with difficulty swallowing, possible bleeding from the nose and mouth, and pain from the procedure.  The patient gave oral consent to proceed.  The flexible scope was inserted into the right nasal cavity and advanced towards the nasopharynx.  Visualized mucosa over the turbinates and septum were normal.  The nasopharynx was clear.  Oropharyngeal walls were symmetric and mobile without lesion, mass, or edema.  Hypopharynx was also without  lesion or edema.  An ulcerative lesion was noted on the posterior aspect of the epiglottis, superior to the vocal cords.  Arytenoid mucosa was mildly edematous.  True vocal folds were pale yellow, mobile, and without mass or lesion.      Assessment: 1.  Referred left otalgia, likely secondary to his left laryngeal lesion.  An ulcerative lesion is noted on the posterior aspect of the epiglottis, superior to the vocal cords.  Both vocal cords are mobile. 2.  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 3.  Dysphagia, likely secondary to the laryngeal  lesion.  Plan: 1.  The physical exam and laryngoscopy findings are reviewed with the patient. 2.  Based on the above findings, the patient will benefit from undergoing direct laryngoscopy and biopsy of the laryngeal lesion.  The risk, benefits, and details of the procedure are discussed. 3.  The patient would like to proceed with the procedure.  Suzie Vandam W Lashaun Poch 03/11/2023, 12:33 PM

## 2023-03-12 ENCOUNTER — Encounter (HOSPITAL_BASED_OUTPATIENT_CLINIC_OR_DEPARTMENT_OTHER)
Admission: RE | Admit: 2023-03-12 | Discharge: 2023-03-12 | Disposition: A | Discharge status: Home / Self Care | Payer: Medicare Other | Source: Ambulatory Visit | Attending: Otolaryngology | Admitting: Otolaryngology

## 2023-03-12 DIAGNOSIS — Z01818 Encounter for other preprocedural examination: Secondary | ICD-10-CM | POA: Insufficient documentation

## 2023-03-15 NOTE — Anesthesia Preprocedure Evaluation (Addendum)
Anesthesia Evaluation  Patient identified by MRN, date of birth, ID band Patient awake    Reviewed: Allergy & Precautions, NPO status , Patient's Chart, lab work & pertinent test results  History of Anesthesia Complications Negative for: history of anesthetic complications  Airway Mallampati: II  TM Distance: >3 FB Neck ROM: Full    Dental  (+)    Pulmonary Current Smoker and Patient abstained from smoking.   Pulmonary exam normal        Cardiovascular hypertension, Pt. on medications and Pt. on home beta blockers Normal cardiovascular exam     Neuro/Psych   Anxiety Depression Bipolar Disorder   CVA    GI/Hepatic ,GERD  Medicated,,(+) Hepatitis -, CRemote hx EtOH and cocaine abuse   Endo/Other  negative endocrine ROS    Renal/GU negative Renal ROS  negative genitourinary   Musculoskeletal negative musculoskeletal ROS (+)    Abdominal   Peds  Hematology negative hematology ROS (+)   Anesthesia Other Findings Supraglottic mass-left  Reproductive/Obstetrics negative OB ROS                              Anesthesia Physical Anesthesia Plan  ASA: 3  Anesthesia Plan: General   Post-op Pain Management: Tylenol PO (pre-op)*   Induction: Intravenous  PONV Risk Score and Plan: 1 and Ondansetron, Dexamethasone, Treatment may vary due to age or medical condition and Midazolam  Airway Management Planned: Oral ETT and Video Laryngoscope Planned  Additional Equipment: None  Intra-op Plan:   Post-operative Plan: Extubation in OR  Informed Consent: I have reviewed the patients History and Physical, chart, labs and discussed the procedure including the risks, benefits and alternatives for the proposed anesthesia with the patient or authorized representative who has indicated his/her understanding and acceptance.     Dental advisory given  Plan Discussed with: CRNA  Anesthesia Plan  Comments:          Anesthesia Quick Evaluation

## 2023-03-16 ENCOUNTER — Encounter (HOSPITAL_BASED_OUTPATIENT_CLINIC_OR_DEPARTMENT_OTHER)
Admission: RE | Disposition: A | Discharge status: Home / Self Care | Payer: Self-pay | Source: Home / Self Care | Attending: Otolaryngology

## 2023-03-16 ENCOUNTER — Encounter (HOSPITAL_BASED_OUTPATIENT_CLINIC_OR_DEPARTMENT_OTHER): Payer: Self-pay | Admitting: Otolaryngology

## 2023-03-16 ENCOUNTER — Ambulatory Visit (HOSPITAL_BASED_OUTPATIENT_CLINIC_OR_DEPARTMENT_OTHER): Payer: Medicare Other | Admitting: Anesthesiology

## 2023-03-16 ENCOUNTER — Ambulatory Visit (HOSPITAL_BASED_OUTPATIENT_CLINIC_OR_DEPARTMENT_OTHER)
Admission: RE | Admit: 2023-03-16 | Discharge: 2023-03-16 | Disposition: A | Discharge status: Home / Self Care | Payer: Medicare Other | Attending: Otolaryngology | Admitting: Otolaryngology

## 2023-03-16 ENCOUNTER — Ambulatory Visit (HOSPITAL_BASED_OUTPATIENT_CLINIC_OR_DEPARTMENT_OTHER): Payer: Self-pay | Admitting: Anesthesiology

## 2023-03-16 ENCOUNTER — Telehealth (HOSPITAL_COMMUNITY): Payer: Self-pay

## 2023-03-16 DIAGNOSIS — J387 Other diseases of larynx: Secondary | ICD-10-CM | POA: Diagnosis not present

## 2023-03-16 DIAGNOSIS — I10 Essential (primary) hypertension: Secondary | ICD-10-CM | POA: Diagnosis not present

## 2023-03-16 DIAGNOSIS — C321 Malignant neoplasm of supraglottis: Secondary | ICD-10-CM | POA: Diagnosis not present

## 2023-03-16 DIAGNOSIS — Z01818 Encounter for other preprocedural examination: Secondary | ICD-10-CM

## 2023-03-16 DIAGNOSIS — R1310 Dysphagia, unspecified: Secondary | ICD-10-CM | POA: Insufficient documentation

## 2023-03-16 DIAGNOSIS — D380 Neoplasm of uncertain behavior of larynx: Secondary | ICD-10-CM

## 2023-03-16 DIAGNOSIS — D491 Neoplasm of unspecified behavior of respiratory system: Secondary | ICD-10-CM | POA: Diagnosis present

## 2023-03-16 DIAGNOSIS — F17210 Nicotine dependence, cigarettes, uncomplicated: Secondary | ICD-10-CM | POA: Insufficient documentation

## 2023-03-16 DIAGNOSIS — K219 Gastro-esophageal reflux disease without esophagitis: Secondary | ICD-10-CM | POA: Insufficient documentation

## 2023-03-16 HISTORY — PX: DIRECT LARYNGOSCOPY: SHX5326

## 2023-03-16 SURGERY — LARYNGOSCOPY, DIRECT
Anesthesia: General | Site: Throat | Laterality: Left

## 2023-03-16 MED ORDER — MIDAZOLAM HCL 2 MG/2ML IJ SOLN
INTRAMUSCULAR | Status: AC
  Filled 2023-03-16: qty 2

## 2023-03-16 MED ORDER — OXYCODONE HCL 5 MG PO TABS
5.0000 mg | ORAL_TABLET | Freq: Once | ORAL | Status: AC | PRN
  Administered 2023-03-16: 5 mg via ORAL

## 2023-03-16 MED ORDER — CEFAZOLIN SODIUM 1 G IJ SOLR
INTRAMUSCULAR | Status: AC
Start: 2023-03-16 — End: ?
  Filled 2023-03-16: qty 20

## 2023-03-16 MED ORDER — ONDANSETRON HCL 4 MG/2ML IJ SOLN
INTRAMUSCULAR | Status: DC | PRN
  Administered 2023-03-16: 4 mg via INTRAVENOUS

## 2023-03-16 MED ORDER — SODIUM CHLORIDE 0.9 % IV SOLN: INTRAVENOUS | Status: DC | PRN

## 2023-03-16 MED ORDER — ONDANSETRON HCL 4 MG/2ML IJ SOLN
INTRAMUSCULAR | Status: AC
  Filled 2023-03-16: qty 2

## 2023-03-16 MED ORDER — FENTANYL CITRATE (PF) 100 MCG/2ML IJ SOLN
INTRAMUSCULAR | Status: AC
  Filled 2023-03-16: qty 2

## 2023-03-16 MED ORDER — ACETAMINOPHEN 500 MG PO TABS
ORAL_TABLET | ORAL | Status: AC
Start: 2023-03-16 — End: ?
  Filled 2023-03-16: qty 2

## 2023-03-16 MED ORDER — SUCCINYLCHOLINE CHLORIDE 200 MG/10ML IV SOSY
PREFILLED_SYRINGE | INTRAVENOUS | Status: DC | PRN
  Administered 2023-03-16: 100 mg via INTRAVENOUS

## 2023-03-16 MED ORDER — LIDOCAINE-EPINEPHRINE 1 %-1:100000 IJ SOLN
INTRAMUSCULAR | Status: AC
  Filled 2023-03-16: qty 1

## 2023-03-16 MED ORDER — DEXAMETHASONE SODIUM PHOSPHATE 10 MG/ML IJ SOLN
INTRAMUSCULAR | Status: DC | PRN
  Administered 2023-03-16: 10 mg via INTRAVENOUS

## 2023-03-16 MED ORDER — ACETAMINOPHEN 500 MG PO TABS
1000.0000 mg | ORAL_TABLET | Freq: Once | ORAL | Status: AC
  Administered 2023-03-16: 1000 mg via ORAL

## 2023-03-16 MED ORDER — DROPERIDOL 2.5 MG/ML IJ SOLN: 0.6250 mg | Freq: Once | INTRAMUSCULAR | Status: DC | PRN

## 2023-03-16 MED ORDER — EPINEPHRINE PF 1 MG/ML IJ SOLN
INTRAMUSCULAR | Status: AC
Start: 2023-03-16 — End: ?
  Filled 2023-03-16: qty 2

## 2023-03-16 MED ORDER — MIDAZOLAM HCL 5 MG/5ML IJ SOLN
INTRAMUSCULAR | Status: DC | PRN
  Administered 2023-03-16: 2 mg via INTRAVENOUS

## 2023-03-16 MED ORDER — FENTANYL CITRATE (PF) 100 MCG/2ML IJ SOLN
25.0000 ug | INTRAMUSCULAR | Status: DC | PRN
  Administered 2023-03-16 (×2): 50 ug via INTRAVENOUS

## 2023-03-16 MED ORDER — OXYCODONE HCL 5 MG PO TABS
ORAL_TABLET | ORAL | Status: AC
  Filled 2023-03-16: qty 1

## 2023-03-16 MED ORDER — EPINEPHRINE PF 1 MG/ML IJ SOLN
INTRAMUSCULAR | Status: DC | PRN
  Administered 2023-03-16: .5 mg via ENDOTRACHEOPULMONARY

## 2023-03-16 MED ORDER — PROPOFOL 10 MG/ML IV BOLUS
INTRAVENOUS | Status: DC | PRN
  Administered 2023-03-16: 150 mg via INTRAVENOUS
  Administered 2023-03-16: 50 mg via INTRAVENOUS

## 2023-03-16 MED ORDER — OXYCODONE HCL 5 MG/5ML PO SOLN: 5.0000 mg | Freq: Once | ORAL | Status: AC | PRN

## 2023-03-16 MED ORDER — CEFAZOLIN SODIUM-DEXTROSE 2-3 GM-%(50ML) IV SOLR
INTRAVENOUS | Status: DC | PRN
  Administered 2023-03-16: 2 g via INTRAVENOUS

## 2023-03-16 MED ORDER — LACTATED RINGERS IV SOLN: INTRAVENOUS | Status: DC

## 2023-03-16 MED ORDER — FENTANYL CITRATE (PF) 250 MCG/5ML IJ SOLN
INTRAMUSCULAR | Status: DC | PRN
  Administered 2023-03-16: 100 ug via INTRAVENOUS

## 2023-03-16 MED ORDER — DEXAMETHASONE SODIUM PHOSPHATE 10 MG/ML IJ SOLN
INTRAMUSCULAR | Status: AC
  Filled 2023-03-16: qty 1

## 2023-03-16 MED ORDER — LIDOCAINE 2% (20 MG/ML) 5 ML SYRINGE
INTRAMUSCULAR | Status: DC | PRN
  Administered 2023-03-16: 80 mg via INTRAVENOUS

## 2023-03-16 SURGICAL SUPPLY — 25 items
ADAPTER TUBE FLEX ULTRASET (MISCELLANEOUS) IMPLANT
CANISTER SUCT 1200ML W/VALVE (MISCELLANEOUS) ×1 IMPLANT
CNTNR URN SCR LID CUP LEK RST (MISCELLANEOUS) IMPLANT
DEFOGGER MIRROR 1QT (MISCELLANEOUS) ×1 IMPLANT
GAUZE SPONGE 4X4 12PLY STRL LF (GAUZE/BANDAGES/DRESSINGS) ×2 IMPLANT
GLOVE BIO SURGEON STRL SZ7.5 (GLOVE) ×1 IMPLANT
GOWN STRL REUS W/ TWL LRG LVL3 (GOWN DISPOSABLE) ×1 IMPLANT
GUARD TEETH (MISCELLANEOUS) IMPLANT
MARKER SKIN DUAL TIP RULER LAB (MISCELLANEOUS) IMPLANT
NDL HYPO 18GX1.5 BLUNT FILL (NEEDLE) ×1 IMPLANT
NDL SPNL 22GX7 QUINCKE BK (NEEDLE) IMPLANT
NDL SPNL 25GX3.5 QUINCKE BL (NEEDLE) IMPLANT
NEEDLE HYPO 18GX1.5 BLUNT FILL (NEEDLE) ×1 IMPLANT
NEEDLE SPNL 22GX7 QUINCKE BK (NEEDLE) IMPLANT
NEEDLE SPNL 25GX3.5 QUINCKE BL (NEEDLE) IMPLANT
NS IRRIG 1000ML POUR BTL (IV SOLUTION) ×1 IMPLANT
PACK BASIN DAY SURGERY FS (CUSTOM PROCEDURE TRAY) ×1 IMPLANT
PATTIES SURGICAL .5 X3 (DISPOSABLE) ×1 IMPLANT
SHEET MEDIUM DRAPE 40X70 STRL (DRAPES) ×1 IMPLANT
SLEEVE SCD COMPRESS KNEE MED (STOCKING) IMPLANT
SURGILUBE 2OZ TUBE FLIPTOP (MISCELLANEOUS) IMPLANT
SYR CONTROL 10ML LL (SYRINGE) IMPLANT
SYR TB 1ML LL NO SAFETY (SYRINGE) IMPLANT
TOWEL GREEN STERILE FF (TOWEL DISPOSABLE) ×1 IMPLANT
TUBE CONNECTING 20X1/4 (TUBING) ×1 IMPLANT

## 2023-03-16 NOTE — Discharge Instructions (Addendum)
  Post Anesthesia Home Care Instructions  Activity: Get plenty of rest for the remainder of the day. A responsible individual must stay with you for 24 hours following the procedure.  For the next 24 hours, DO NOT: -Drive a car -Advertising copywriter -Drink alcoholic beverages -Take any medication unless instructed by your physician -Make any legal decisions or sign important papers.  Meals: Start with liquid foods such as gelatin or soup. Progress to regular foods as tolerated. Avoid greasy, spicy, heavy foods. If nausea and/or vomiting occur, drink only clear liquids until the nausea and/or vomiting subsides. Call your physician if vomiting continues.  Special Instructions/Symptoms: Your throat may feel dry or sore from the anesthesia or the breathing tube placed in your throat during surgery. If this causes discomfort, gargle with warm salt water. The discomfort should disappear within 24 hours.  Tylenol can be taken at 2:55 pm if needed  ----------  The patient may resume all his previous activities and diet.  He has a postop appointment scheduled for next Monday.

## 2023-03-16 NOTE — Op Note (Signed)
DATE OF PROCEDURE:  03/16/2023                              OPERATIVE REPORT  SURGEON:  Newman Pies, MD  PREOPERATIVE DIAGNOSES: 1.  Supraglottic tumor  POSTOPERATIVE DIAGNOSES: 1.  Supraglottic tumor  PROCEDURE PERFORMED: Direct laryngoscopy with biopsy  ANESTHESIA:  General endotracheal tube anesthesia.  COMPLICATIONS:  None.  ESTIMATED BLOOD LOSS:  Minimal.  INDICATION FOR PROCEDURE:  [name] is a 52 y.o. male with a 37-month history of left ear and throat pain.  On examination, he was noted to have a fungating mass on the posterior aspect of the left epiglottis.  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  Based on the above findings, the decision was made for the patient to undergo the biopsy procedure.  The risks, benefits, alternatives, and details of the procedure were discussed with the patient.  Questions were invited and answered.  Informed consent was obtained.  DESCRIPTION:  The patient was taken to the operating room and placed supine on the operating table.  General endotracheal tube anesthesia was administered by the anesthesiologist.  The patient was positioned and prepped and draped in a standard fashion for direct laryngoscopy.  The Dedo laryngoscope was used for the examination.  The laryngoscope was inserted via the oral cavity into the pharynx.  Examination of the vallecula, piriform sinuses, and the pharyngeal mucosa were all normal.  A fungating mass was noted on the posterior aspect of the left epiglottis.  The mass did not appear to involve the vocal cords.  The Dedo laryngoscope was suspended with a loosely suspended.  Photodocumentation of the findings was obtained.  Multiple biopsy specimens were obtained from the fungating mass.  The specimens were sent to the pathology department for permanent histologic identification.  Hemostasis was achieved with pledgets soaked with epinephrine.  The care of the patient was turned  over to the anesthesiologist.  The patient was awakened from anesthesia without difficulty.  The patient was extubated and transferred to the recovery room in good condition.  OPERATIVE FINDINGS: A fungating tumor was noted on the posterior aspect of the left epiglottis.  SPECIMEN:  None  FOLLOWUP CARE:  The patient will be discharged home once awake and alert.   The patient will follow up in my office in 1 week.  Herby Amick W Kashlynn Kundert 03/16/2023 10:37 AM

## 2023-03-16 NOTE — H&P (Signed)
CC: Left ear and throat pain   HPI:  [name] is a 52 y.o. male who presents today complaining of left ear and left throat pain for the past 3 months.  His symptoms started as a pressure sensation and pain in the left ear.  It has subsequently progressed to involve the left side of his throat.  He has also noted increasing swallowing difficulty.  He was recently diagnosed with sinus infection, and was treated with oral antibiotic, steroid, and Flonase.  However, he continues to be symptomatic.  He denies any change in his hearing.  He denies any environmental allergies.  He has no previous ENT surgery.  He has been smoking daily for the past 20+ years.       Past Medical History:  Diagnosis Date   Anxiety     Bipolar 1 disorder (HCC)     EtOH dependence (HCC)      QUIT   GERD without esophagitis     Hepatitis C carrier (HCC)     Hypercholesteremia     Hypertension     Polysubstance dependence (HCC)      COCAINE- quit   Stroke [facility])      has some ST memory deficits from stroke.               Past Surgical History:  Procedure Laterality Date   ANEURYSM COILING        AGE 17 x2   APPENDECTOMY       BIOPSY   01/05/2017    Procedure: BIOPSY;  Surgeon: West Bali, MD;  [facility];  Service: Endoscopy;;  Duodenal and Gastric   COLONOSCOPY WITH PROPOFOL N/A 01/05/2017    Procedure: COLONOSCOPY WITH PROPOFOL;  Surgeon: West Bali, MD;  [facility];  Service: Endoscopy;  Laterality: N/A;  10:00am   ESOPHAGOGASTRODUODENOSCOPY (EGD) WITH PROPOFOL N/A 01/05/2017    Procedure: ESOPHAGOGASTRODUODENOSCOPY (EGD) WITH PROPOFOL;  Surgeon: West Bali, MD;  [facility];  Service: Endoscopy;  Laterality: N/A;   LAPAROSCOPIC APPENDECTOMY       MASS EXCISION Right 01/13/2017    Procedure: EXCISION LIPOMA RIGHT ARM;  Surgeon: Lucretia Roers, MD;  Location: [facility];  Service: General;  Laterality: Right;               Family History   Problem Relation Age of Onset   Stomach cancer Mother     Bone cancer Sister 3   Colon cancer Brother 6   Colon polyps Neg Hx            Social History:  reports that he has been smoking cigarettes. He has a 6.3 pack-year smoking history. He has never used smokeless tobacco. He reports current alcohol use. He reports that he does not currently use drugs after having used the following drugs: "Crack" cocaine.   Allergies:  Allergies  No Known Allergies            Prior to Admission medications   Medication Sig Start Date End Date Taking? Authorizing Provider  amlodipine-olmesartan (AZOR) 10-20 MG tablet Take 1 tablet by mouth daily. 02/04/23   Yes [name], MD  famotidine (PEPCID) 40 MG tablet Take 1 tablet (40 mg total) by mouth daily. 02/04/23   Yes [name], MD  FLUoxetine (PROZAC) 20 MG capsule Take 1 capsule (20 mg total) by mouth daily. 02/04/23   Yes [name], MD  fluticasone (FLONASE) 50 MCG/ACT nasal  spray Place 1 spray into both nostrils 2 (two) times daily. 10/28/22   Yes Particia Nearing, PA-C  hydrOXYzine (ATARAX) 25 MG tablet Take 1 tablet (25 mg total) by mouth 3 (three) times daily as needed for anxiety. 02/04/23   Yes [name], MD  ibuprofen (ADVIL) 400 MG tablet Take by mouth. 10/02/19   Yes [provider]  metoprolol succinate (TOPROL-XL) 25 MG 24 hr tablet Take 1 tablet (25 mg total) by mouth daily. 02/04/23   Yes [name], MD      Height 5\' 4"  (1.626 m), weight 150 lb (68 kg). Exam: General: Communicates without difficulty, well nourished, no acute distress. Head: Normocephalic, no evidence injury, no tenderness, facial buttresses intact without stepoff. Face/sinus: No tenderness to palpation and percussion. Facial movement is normal and symmetric. Eyes: PERRL, EOMI. No scleral icterus, conjunctivae clear. Neuro: CN II exam reveals vision grossly intact.  No nystagmus at any point of gaze. Ears: Auricles well formed  without lesions.  Ear canals are intact without mass or lesion.  No erythema or edema is appreciated.  The TMs are intact without fluid. Nose: External evaluation reveals normal support and skin without lesions.  Dorsum is intact.  Anterior rhinoscopy reveals congested mucosa over anterior aspect of inferior turbinates and intact septum.  No purulence noted. Oral:  Oral cavity and oropharynx are intact, symmetric, without erythema or edema.  Mucosa is moist without lesions. Neck: Full range of motion without pain. Thyroid bed within normal limits to palpation.  Parotid glands and submandibular glands equal bilaterally without mass.  Trachea is midline. Neuro:  CN 2-12 grossly intact. A flexible scope was inserted into the right nasal cavity and advanced towards the nasopharynx.  Visualized mucosa over the turbinates and septum were normal.  The nasopharynx was clear.  Oropharyngeal walls were symmetric and mobile without lesion, mass, or edema.  Hypopharynx was also without  lesion or edema.  An ulcerative lesion was noted on the posterior aspect of the epiglottis, superior to the vocal cords.  Arytenoid mucosa was mildly edematous.  True vocal folds were pale yellow, mobile, and without mass or lesion.       Assessment: 1.  Referred left otalgia, likely secondary to his left laryngeal lesion.  An ulcerative lesion is noted on the posterior aspect of the epiglottis, superior to the vocal cords.  Both vocal cords are mobile. 2.  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 3.  Dysphagia, likely secondary to the laryngeal lesion.   Plan: 1.  The physical exam and laryngoscopy findings are reviewed with the patient. 2.  Based on the above findings, the patient will benefit from undergoing direct laryngoscopy and biopsy of the laryngeal lesion.  The risk, benefits, and details of the procedure are discussed. 3.  The patient would like to proceed with the procedure.

## 2023-03-16 NOTE — Telephone Encounter (Signed)
03/18/23 appt confirmed

## 2023-03-16 NOTE — Anesthesia Postprocedure Evaluation (Signed)
Anesthesia Post Note  Patient: [name]  Procedure(s) Performed: Direct laryngoscopy with biopsy of supraglottic mass (Left: Throat)     Patient location during evaluation: PACU Anesthesia Type: General Level of consciousness: awake and alert Pain management: pain level controlled Vital Signs Assessment: post-procedure vital signs reviewed and stable Respiratory status: spontaneous breathing, nonlabored ventilation and respiratory function stable Cardiovascular status: blood pressure returned to baseline Postop Assessment: no apparent nausea or vomiting Anesthetic complications: no   No notable events documented.  Last Vitals:  Vitals:   03/16/23 1100 03/16/23 1112  BP: 112/77 115/87  Pulse: 68 74  Resp: 14   Temp:  36.8 C  SpO2: 90% 93%    Last Pain:  Vitals:   03/16/23 1100  TempSrc:   PainSc: 4                  Shanda Howells

## 2023-03-16 NOTE — Transfer of Care (Signed)
Immediate Anesthesia Transfer of Care Note  Patient: [name]  Procedure(s) Performed: Direct laryngoscopy with biopsy of supraglottic mass (Left: Throat)  Patient Location: PACU  Anesthesia Type:General  Level of Consciousness: drowsy  Airway & Oxygen Therapy: Patient Spontanous Breathing and Patient connected to face mask oxygen  Post-op Assessment: Report given to RN and Post -op Vital signs reviewed and stable  Post vital signs: Reviewed and stable  Last Vitals:  Vitals Value Taken Time  BP 124/71 03/16/23 1030  Temp 36.8 C 03/16/23 1030  Pulse 70 03/16/23 1030  Resp 10 03/16/23 1030  SpO2 94 % 03/16/23 1030  Vitals shown include unfiled device data.  Last Pain:  Vitals:   03/16/23 0853  TempSrc: Temporal  PainSc: 4       Patients Stated Pain Goal: 3 (03/16/23 0853)  Complications: No notable events documented.

## 2023-03-16 NOTE — Anesthesia Procedure Notes (Signed)
Procedure Name: Intubation Date/Time: 03/16/2023 10:00 AM  Performed by: Roosvelt Harps, CRNAPre-anesthesia Checklist: Patient identified, Emergency Drugs available, Suction available and Patient being monitored Patient Re-evaluated:Patient Re-evaluated prior to induction Oxygen Delivery Method: Circle System Utilized Preoxygenation: Pre-oxygenation with 100% oxygen Induction Type: IV induction Ventilation: Mask ventilation without difficulty Laryngoscope Size: Glidescope and 3 Grade View: Grade I Tube type: Oral Tube size: 6.5 mm Number of attempts: 1 Airway Equipment and Method: Rigid stylet Placement Confirmation: ETT inserted through vocal cords under direct vision, positive ETCO2 and breath sounds checked- equal and bilateral Secured at: 23 cm Tube secured with: Tape Dental Injury: Teeth and Oropharynx as per pre-operative assessment

## 2023-03-17 ENCOUNTER — Other Ambulatory Visit (INDEPENDENT_AMBULATORY_CARE_PROVIDER_SITE_OTHER): Payer: Self-pay | Admitting: Otolaryngology

## 2023-03-17 ENCOUNTER — Encounter (HOSPITAL_BASED_OUTPATIENT_CLINIC_OR_DEPARTMENT_OTHER): Payer: Self-pay | Admitting: Otolaryngology

## 2023-03-17 DIAGNOSIS — C329 Malignant neoplasm of larynx, unspecified: Secondary | ICD-10-CM

## 2023-03-17 LAB — SURGICAL PATHOLOGY

## 2023-03-18 ENCOUNTER — Ambulatory Visit (HOSPITAL_COMMUNITY): Payer: Medicare Other | Admitting: Psychiatry

## 2023-03-18 ENCOUNTER — Encounter (HOSPITAL_COMMUNITY): Payer: Self-pay | Admitting: Psychiatry

## 2023-03-18 DIAGNOSIS — F1020 Alcohol dependence, uncomplicated: Secondary | ICD-10-CM

## 2023-03-18 DIAGNOSIS — F4001 Agoraphobia with panic disorder: Secondary | ICD-10-CM

## 2023-03-18 DIAGNOSIS — F17200 Nicotine dependence, unspecified, uncomplicated: Secondary | ICD-10-CM

## 2023-03-18 DIAGNOSIS — R413 Other amnesia: Secondary | ICD-10-CM | POA: Diagnosis not present

## 2023-03-18 DIAGNOSIS — F1421 Cocaine dependence, in remission: Secondary | ICD-10-CM | POA: Diagnosis not present

## 2023-03-18 DIAGNOSIS — F333 Major depressive disorder, recurrent, severe with psychotic symptoms: Secondary | ICD-10-CM | POA: Diagnosis not present

## 2023-03-18 DIAGNOSIS — F411 Generalized anxiety disorder: Secondary | ICD-10-CM
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
## 2023-03-18 NOTE — Progress Notes (Signed)
Oncology Nurse Navigator Documentation   Placed introductory call to new referral patient Ikechukwu Zahra. Introduced myself as the H&N oncology nurse navigator that works with Dr. Basilio Cairo to whom he has been referred by Dr. Suszanne Conners. He confirmed understanding of referral. Briefly explained my role as his navigator, provided my contact information.  Confirmed  upcoming appt for his CT neck at [facility] on 12/24, explained arrival and registration process. I also made him aware that he will receive a phone call to get scheduled with Dr. Basilio Cairo, Radiation Oncologist. I encouraged him to call with questions/concerns as he moves forward with appts and procedures.   He verbalized understanding of information provided, expressed appreciation for my call.    Hedda Slade RN, BSN, OCN Head & Neck Oncology Nurse Navigator [facility] Phone # [phone]  Fax # [phone]

## 2023-03-18 NOTE — Patient Instructions (Addendum)
We added Seroquel (quetiapine) to your regimen today.  Take 50 mg nightly and this should begin to help with your insomnia as well as the hallucinations and paranoia.  We otherwise kept the hydroxyzine and fluoxetine the same for right now.  I will coordinate with your PCP about getting a memory test done more formally as I am concerned for a neurocognitive disorder (dementia).  They may also want to do further workup including a scan of your head.  I will also see if they can start a vitamin D supplement for you and they may want to check for other vitamin deficiencies like B12, folate, iron.

## 2023-03-19 NOTE — Progress Notes (Signed)
Head and Neck Cancer Location of Tumor / Histology:  Squamous cell carcinoma of supraglottis  Patient presented with symptoms of: per Dr. Avel Sensor 03/10/23 office note: "complaining of left ear and left throat pain for the past 3 months. His symptoms started as a pressure sensation and pain in the left ear. It has subsequently progressed to involve the left side of his throat. He has also noted increasing swallowing difficulty. He was recently diagnosed with sinus infection, and was treated with oral antibiotic, steroid, and Flonase. However, he continues to be symptomatic."  Biopsies revealed:  03/16/2023 A. 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 []  [x]  Reports decreased appetite.   Swallowing concerns? [x]  []    PEG? []  [x]     Referrals Yes No Comments  Social Work? []  [x]    Dentistry? []  [x]    Swallowing therapy? []  [x]    Nutrition? []  [x]    Med/Onc? []  []     Safety Issues Yes No Comments  Prior radiation? []  [x]    Pacemaker/ICD? []  [x]    Possible current pregnancy? []  [x]  N/A  Is the patient on methotrexate? []  [x]     Tobacco/Marijuana/Snuff/ETOH use:  Tobacco use and etoh. Patient reports decreased tobacco use. 1/2 per day.   Past/Anticipated interventions by otolaryngology, if any:  03/16/2023 --Dr. Newman Pies Direct laryngoscopy with biopsy   Past/Anticipated interventions by medical oncology, if any: None at this time.   Current Complaints / other details:    BP (!) 140/87 (BP Location: Left Arm, Patient Position: Sitting)   Pulse 76   Temp 98.4 F (36.9 C) (Temporal)   Resp 18   Ht 5\' 4"  (1.626 m)   Wt 152 lb 6 oz (69.1 kg)   SpO2 98%   BMI 26.16 kg/m

## 2023-03-22 ENCOUNTER — Other Ambulatory Visit: Payer: Self-pay

## 2023-03-22 ENCOUNTER — Ambulatory Visit (INDEPENDENT_AMBULATORY_CARE_PROVIDER_SITE_OTHER): Payer: Medicare Other | Admitting: Otolaryngology

## 2023-03-22 ENCOUNTER — Encounter (INDEPENDENT_AMBULATORY_CARE_PROVIDER_SITE_OTHER): Payer: Self-pay

## 2023-03-22 VITALS — Ht 64.0 in | Wt 147.0 lb

## 2023-03-22 DIAGNOSIS — C321 Malignant neoplasm of supraglottis: Secondary | ICD-10-CM | POA: Diagnosis not present

## 2023-03-22 DIAGNOSIS — H9202 Otalgia, left ear: Secondary | ICD-10-CM

## 2023-03-22 DIAGNOSIS — C329 Malignant neoplasm of larynx, unspecified: Secondary | ICD-10-CM

## 2023-03-22 NOTE — Progress Notes (Signed)
Patient ID: [name], male   DOB: [birth year], 52 y.o.   MRN: [id number]  Follow-up: Laryngeal squamous cell carcinoma  HPI: The patient is a 52 year old male who returns today for his follow-up evaluation.  He was previously seen for left ear pain.  He was noted to have a left supraglottic tumor.  Biopsy of the tumor was consistent with squamous cell carcinoma.  The patient returns today complaining of persistent left ear pain and occasional swallowing difficulty.  He denies any breathing difficulty.  He is scheduled to see Dr. Lonie Peak at the cancer center tomorrow.  He also has a neck and PET CT scan pending.  Exam: General: Communicates without difficulty, well nourished, no acute distress. Head: Normocephalic, no evidence injury, no tenderness, facial buttresses intact without stepoff. Face/sinus: No tenderness to palpation and percussion. Facial movement is normal and symmetric. Eyes: PERRL, EOMI. No scleral icterus, conjunctivae clear. Neuro: CN II exam reveals vision grossly intact.  No nystagmus at any point of gaze. Ears: Auricles well formed without lesions.  Ear canals are intact without mass or lesion.  No erythema or edema is appreciated.  The TMs are intact without fluid. Nose: External evaluation reveals normal support and skin without lesions.  Dorsum is intact.  Anterior rhinoscopy reveals congested mucosa over anterior aspect of inferior turbinates and intact septum.  No purulence noted. Oral:  Oral cavity and oropharynx are intact, symmetric, without erythema or edema.  Mucosa is moist without lesions. Neck: Full range of motion without pain. Thyroid bed within normal limits to palpation.  Parotid glands and submandibular glands equal bilaterally without mass.  Trachea is midline. Neuro:  CN 2-12 grossly intact.   Assessment: 1.  The patient has a left supraglottic squamous cell carcinoma, centered around the posterior aspect of the epiglottis.  The tumor does not appear to  involve the vocal cords. 2.  Referred left otalgia.  Plan: 1.  The physical exam findings and the pathology results are reviewed with the patient. 2.  Based on the above findings, the patient will likely be a candidate for radiation treatment of his supraglottic squamous cell carcinoma. 3.  Neck and PET CT scan for staging of his supraglottic squamous cell carcinoma. 4.  The patient will see radiation oncologist Dr. Basilio Cairo tomorrow.

## 2023-03-23 ENCOUNTER — Encounter (HOSPITAL_COMMUNITY)
Admission: RE | Admit: 2023-03-23 | Discharge: 2023-03-23 | Disposition: A | Discharge status: Home / Self Care | Payer: Medicare Other | Source: Ambulatory Visit | Attending: Radiation Oncology | Admitting: Radiation Oncology

## 2023-03-23 ENCOUNTER — Ambulatory Visit (HOSPITAL_COMMUNITY)
Admission: RE | Admit: 2023-03-23 | Discharge: 2023-03-23 | Disposition: A | Discharge status: Home / Self Care | Payer: Medicare Other | Source: Ambulatory Visit | Attending: Otolaryngology | Admitting: Otolaryngology

## 2023-03-23 ENCOUNTER — Encounter (HOSPITAL_COMMUNITY): Payer: Self-pay

## 2023-03-23 DIAGNOSIS — I517 Cardiomegaly: Secondary | ICD-10-CM | POA: Diagnosis not present

## 2023-03-23 DIAGNOSIS — C329 Malignant neoplasm of larynx, unspecified: Secondary | ICD-10-CM

## 2023-03-23 DIAGNOSIS — R590 Localized enlarged lymph nodes: Secondary | ICD-10-CM | POA: Insufficient documentation

## 2023-03-23 DIAGNOSIS — J320 Chronic maxillary sinusitis: Secondary | ICD-10-CM | POA: Insufficient documentation

## 2023-03-23 DIAGNOSIS — I700 Atherosclerosis of aorta: Secondary | ICD-10-CM | POA: Insufficient documentation

## 2023-03-23 LAB — GLUCOSE, CAPILLARY: Glucose-Capillary: 127 mg/dL — ABNORMAL HIGH (ref 70–99)

## 2023-03-23 MED ORDER — FLUDEOXYGLUCOSE F - 18 (FDG) INJECTION
7.3500 | Freq: Once | INTRAVENOUS | Status: AC
  Administered 2023-03-23: 7.35 via INTRAVENOUS

## 2023-03-23 MED ORDER — IOHEXOL 300 MG/ML  SOLN
75.0000 mL | Freq: Once | INTRAMUSCULAR | Status: AC | PRN
  Administered 2023-03-23: 75 mL via INTRAVENOUS

## 2023-03-25 NOTE — Progress Notes (Signed)
Radiation Oncology         ([phone]) [phone] ________________________________  Initial Outpatient Consultation  Name: [name] MRN: [id number]  Date: 03/26/2023  DOB: 1970-07-27  WU:[id number], Earlie Lou, MD  Newman Pies, MD   REFERRING PHYSICIAN: Newman Pies, MD  DIAGNOSIS:    ICD-10-CM   1. Malignant neoplasm of supraglottis (HCC) [C32.1]  C32.1 nicotine (NICODERM CQ - DOSED IN MG/24 HOURS) 21 mg/24hr patch    nicotine (NICODERM CQ - DOSED IN MG/24 HOURS) 14 mg/24hr patch    nicotine (NICODERM CQ - DOSED IN MG/24 HR) 7 mg/24hr patch    2. Malignant neoplasm of supraglottis (HCC)  C32.1 oxymetazoline (AFRIN) 0.05 % nasal spray 1 spray    lidocaine (XYLOCAINE) 2 % solution       Cancer Staging  Malignant neoplasm of supraglottis (HCC) Staging form: Larynx - Supraglottis, AJCC 8th Edition - Clinical stage from 03/26/2023: Stage III (cT1, cN1, cM0) - Unsigned Stage prefix: Initial diagnosis  invasive and in situ moderately differentiated squamous cell carcinoma of the left supraglottis  CHIEF COMPLAINT: Here to discuss management of supraglottic cancer  HISTORY OF PRESENT ILLNESS::[name] is a 52 y.o. male who presented to the [facility] on 10/28/22 with c/o left ear pain, popping with chewing, and pain radiating down the side of his neck and throat x 1 month. He was treated for a left ear infection with a course of prednisone.  His symptoms including left ear and throat pain however persisted and progressed and he was subsequently referred to Dr. Suszanne Conners on 03/10/23 at [facility] for further evaluation. During which time, the patient detailed that his pain first began in the ear and eventually progressed to involve the left side of his throat.  A laryngoscopy was subsequently performed at that time which revealed an ulcerative lesion on the posterior aspect of the epiglottis, superior to the vocal cords. The arytenoid mucosa was also mildly edematous and the true vocal folds  were pale yellow, mobile, and without mass or lesion.      He accordingly underwent a direct laryngoscopy with biopsies of the left supraglottic tumor/mass on 03/16/23. Pathology showed findings consistent with invasive and in situ moderately differentiated squamous cell carcinoma. Per note by Dr Suszanne Conners, "Examination of the vallecula, piriform sinuses, and the pharyngeal mucosa were all normal. A fungating mass was noted on the posterior aspect of the left epiglottis. The mass did not appear to involve the vocal cords."  Soft tissue neck CT and PET scan on 03/23/23 were done - results just came back demonstrating a left supraglottic lesion which may involve the glottis as well as a 7 mm left level 3 lymph node that is suspicious for metastatic involvement.  No distant metastatic disease.  Swallowing issues, if any: occasional dysphagia   Weight Changes: none Wt Readings from Last 3 Encounters:  03/26/23 152 lb 6 oz (69.1 kg)  03/22/23 147 lb (66.7 kg)  03/16/23 147 lb 14.9 oz (67.1 kg)    Pain status: left ear, neck, and throat pain as noted below   Other symptoms: left ear pain, popping with chewing, and pain radiating down the side of his neck and throat x 1 month  ETOH abuse, if any: consumes 1 standard drink of alcohol per a week Patient reports decreased tobacco use recently. 1/2 pack per day.  20 yr history Previous illicit drug use.  PREVIOUS RADIATION THERAPY: No  PAST MEDICAL HISTORY:  has a past medical history of Anxiety,  Bipolar 1 disorder (HCC), EtOH dependence (HCC), GERD without esophagitis, Hepatitis C carrier (HCC), Hypercholesteremia, Hypertension, laryngeal ca (02/2023), Polysubstance dependence (HCC), Stroke [facility]), and Substance induced mood disorder (HCC) (11/05/2016).    PAST SURGICAL HISTORY: Past Surgical History:  Procedure Laterality Date   ANEURYSM COILING     AGE 34 x2   APPENDECTOMY     BIOPSY  01/05/2017   Procedure: BIOPSY;  Surgeon: West Bali, MD;   [facility];  Service: Endoscopy;;  Duodenal and Gastric   COLONOSCOPY WITH PROPOFOL N/A 01/05/2017   Procedure: COLONOSCOPY WITH PROPOFOL;  Surgeon: West Bali, MD;  Location: [facility];  Service: Endoscopy;  Laterality: N/A;  10:00am   DIRECT LARYNGOSCOPY Left 03/16/2023   Procedure: Direct laryngoscopy with biopsy of supraglottic mass;  Surgeon: Newman Pies, MD;  Location: [facility];  Service: ENT;  Laterality: Left;   ESOPHAGOGASTRODUODENOSCOPY (EGD) WITH PROPOFOL N/A 01/05/2017   Procedure: ESOPHAGOGASTRODUODENOSCOPY (EGD) WITH PROPOFOL;  Surgeon: West Bali, MD;  Location: [facility];  Service: Endoscopy;  Laterality: N/A;   LAPAROSCOPIC APPENDECTOMY     MASS EXCISION Right 01/13/2017   Procedure: EXCISION LIPOMA RIGHT ARM;  Surgeon: Lucretia Roers, MD;  Location: [facility];  Service: General;  Laterality: Right;    FAMILY HISTORY: family history includes Bone cancer (age of onset: 53) in his sister; Cancer in his brother, mother, and sister; Colon cancer (age of onset: 73) in his brother; Stomach cancer in his mother.  SOCIAL HISTORY:  reports that he has been smoking cigarettes. He has a 6.3 pack-year smoking history. He has never used smokeless tobacco. He reports current alcohol use of about 1.0 standard drink of alcohol per week. He reports that he does not currently use drugs after having used the following drugs: "Crack" cocaine, Cocaine, and Marijuana.  ALLERGIES: Patient has no known allergies.  MEDICATIONS:  Current Outpatient Medications  Medication Sig Dispense Refill   amlodipine-olmesartan (AZOR) 10-20 MG tablet Take 1 tablet by mouth daily. 30 tablet 1   FLUoxetine (PROZAC) 20 MG capsule Take 1 capsule (20 mg total) by mouth daily. 30 capsule 3   fluticasone (FLONASE) 50 MCG/ACT nasal spray Place 1 spray into both nostrils 2 (two) times daily. 16 g 2   hydrOXYzine (ATARAX) 25 MG tablet Take 1 tablet (25 mg total) by mouth 3 (three)  times daily as needed for anxiety. 90 tablet 1   lidocaine (XYLOCAINE) 2 % solution Patient: Mix 1part 2% viscous lidocaine, 1part H20. Swallow 10mL of diluted mixture, before meals and at bedtime, up to QID 200 mL 3   metoprolol succinate (TOPROL-XL) 25 MG 24 hr tablet Take 1 tablet (25 mg total) by mouth daily. 30 tablet 1   nicotine (NICODERM CQ - DOSED IN MG/24 HOURS) 14 mg/24hr patch Place 1 patch (14 mg total) onto the skin daily. Apply 21 mg patch daily x 6 wk, then 14mg  patch daily x 2 wk, then 7 mg patch daily x 2 wk 14 patch 0   nicotine (NICODERM CQ - DOSED IN MG/24 HOURS) 21 mg/24hr patch Place 1 patch (21 mg total) onto the skin daily. Apply 21 mg patch daily x 6 wk, then 14mg  patch daily x 2 wk, then 7 mg patch daily x 2 wk 14 patch 2   nicotine (NICODERM CQ - DOSED IN MG/24 HR) 7 mg/24hr patch Place 1 patch (7 mg total) onto the skin daily. Apply 21 mg patch daily x 6 wk,  then 14mg  patch daily x 2 wk, then 7 mg patch daily x 2 wk 14 patch 0   famotidine (PEPCID) 40 MG tablet Take 1 tablet (40 mg total) by mouth daily. (Patient not taking: Reported on 03/26/2023) 30 tablet 3   No current facility-administered medications for this encounter.    REVIEW OF SYSTEMS:  Notable for that above.   PHYSICAL EXAM:  height is 5\' 4"  (1.626 m) and weight is 152 lb 6 oz (69.1 kg). His temporal temperature is 98.4 F (36.9 C). His blood pressure is 140/87 (abnormal) and his pulse is 76. His respiration is 18 and oxygen saturation is 98%.   General: Alert and oriented, in no acute distress HEENT: Head is normocephalic. Extraocular movements are intact. Oropharynx is notable for no lesions. hoarse Neck: Neck is notable for no masses Heart: Regular in rate and rhythm with no murmurs, rubs, or gallops. Chest: Clear to auscultation bilaterally, with no rhonchi, wheezes, or rales. Abdomen: Soft, nontender, nondistended, with no rigidity or guarding. Extremities: No cyanosis or edema. Lymphatics:  see Neck Exam Skin: No concerning lesions. Tattoo noted on on neck Musculoskeletal: symmetric strength and muscle tone throughout. Neurologic: Cranial nerves II through XII are grossly intact. No obvious focalities. Speech is fluent. Coordination is intact. Psychiatric: Judgment and insight are intact. Affect is appropriate.  PROCEDURE NOTE: After obtaining consent and spraying nasal cavity with topical oxymetazoline, the flexible endoscope was coated with lidocaine gel and introduced and passed through the nasal cavity.  The nasopharynx, oropharynx, hypopharynx, and larynx were then examined.  There is an exophytic mass at the base of the left epiglottis, laryngeal side.  This appears to involve the false cords but it is difficult to visualize the true left vocal cord given the presence of supraglottic tumor.  No obvious cord mobility issues.  The patient tolerated the procedure well.   ECOG = 1  0 - Asymptomatic (Fully active, able to carry on all predisease activities without restriction)  1 - Symptomatic but completely ambulatory (Restricted in physically strenuous activity but ambulatory and able to carry out work of a light or sedentary nature. For example, light housework, office work)  2 - Symptomatic, <50% in bed during the day (Ambulatory and capable of all self care but unable to carry out any work activities. Up and about more than 50% of waking hours)  3 - Symptomatic, >50% in bed, but not bedbound (Capable of only limited self-care, confined to bed or chair 50% or more of waking hours)  4 - Bedbound (Completely disabled. Cannot carry on any self-care. Totally confined to bed or chair)  5 - Death   Santiago Glad MM, Creech RH, Tormey DC, et al. [phone]). "Toxicity and response criteria of the [facility]". Am. Evlyn Clines. Oncol. 5 (6): 649-55   LABORATORY DATA:  Lab Results  Component Value Date   WBC 6.6 02/04/2023   HGB 14.5 02/04/2023   HCT 43.5 02/04/2023    MCV 89 02/04/2023   PLT 215 02/04/2023   CMP     Component Value Date/Time   NA 140 02/04/2023 1354   K 4.7 02/04/2023 1354   CL 103 02/04/2023 1354   CO2 24 02/04/2023 1354   GLUCOSE 75 02/04/2023 1354   GLUCOSE 102 (H) 06/22/2017 2301   BUN 10 02/04/2023 1354   CREATININE 0.90 02/04/2023 1354   CREATININE 0.96 05/04/2017 0910   CALCIUM 9.4 02/04/2023 1354   PROT 7.3 02/04/2023 1354   ALBUMIN 4.4 02/04/2023  1354   AST 19 02/04/2023 1354   ALT 14 02/04/2023 1354   ALKPHOS 67 02/04/2023 1354   BILITOT 0.3 02/04/2023 1354   GFRNONAA >60 06/22/2017 2301   GFRAA >60 06/22/2017 2301      Lab Results  Component Value Date   TSH 1.740 02/04/2023     RADIOGRAPHY: CT Soft Tissue Neck W Contrast Result Date: 03/26/2023 CLINICAL DATA:  Provided history: Laryngeal cancer. Head/neck cancer, staging. EXAM: CT NECK WITH CONTRAST TECHNIQUE: Multidetector CT imaging of the neck was performed using the standard protocol following the bolus administration of intravenous contrast. RADIATION DOSE REDUCTION: This exam was performed according to the departmental dose-optimization program which includes automated exposure control, adjustment of the mA and/or kV according to patient size and/or use of iterative reconstruction technique. CONTRAST:  75mL OMNIPAQUE IOHEXOL 300 MG/ML  SOLN COMPARISON:  PET CT 03/23/2023. FINDINGS: Pharynx and larynx: Enhancing mucosal/submucosal lesion along the laryngeal surface of the epiglottis on the left, along the left aryepiglottic fold, more inferiorly within the supraglottic larynx at midline and to the left and extending inferiorly to the level of the false vocal cord. The lesion measures up to 8 mm in thickness and spans 3 cm in craniocaudal dimension (series 2, image 66) (series 604, image 38). The tumor partially effaces the left pyriform sinus. Additionally, there is apparent infiltration of the paraglottic space on the left (series 2, image 61). No definite  involvement of the true vocal cords. Salivary glands: No inflammation, mass, or stone. Thyroid: Unremarkable. Lymph nodes: No pathologically enlarged cervical chain lymph nodes identified. However, please refer to the same-day PET-CT for a description a hypermetabolic left level III lymph node. Vascular: The major vascular structures of the neck are patent. Minimal atherosclerotic plaque about the carotid bifurcations bilaterally. Limited intracranial: No evidence of an acute intracranial abnormality within the field of view. Visualized orbits: Incompletely imaged. No orbital mass or acute orbital finding. Mastoids and visualized paranasal sinuses: The frontal sinuses are excluded from the field of view. Portions of the ethmoid sinuses are also excluded from the field of view superiorly. 2.7 cm mucous retention cyst within the left maxillary sinus inferiorly. Polypoid mucosal thickening more superiorly within the left maxillary sinus, and within the right maxillary sinus. No significant mastoid effusion. Skeleton: Cervical spondylosis. No acute fracture or aggressive osseous lesion. Upper chest: No consolidation or nodule within the imaged lung apices. IMPRESSION: 1. Enhancing mucosal/submucosal lesion along the laryngeal surface of the epiglottis on the left, along the left aryepiglottic fold, more inferiorly within the supraglottic larynx at midline and to the left and extending inferiorly to the level of the false vocal cord. This is consistent with a mucosal/submucosal neoplasm. The lesion measures up to 8 mm in thickness and spans 3 cm in craniocaudal dimension. The tumor partially effaces the left pyriform sinus. Additionally, there is apparent infiltration of the paraglottic space on the left. No definite involvement of the true vocal cords. However, correlate with findings at pharyngolaryngoscopy. 2. Please refer to the same-day PET-CT for a description a hypermetabolic left level III lymph node. 3.  Paranasal sinus disease as described. Electronically Signed   By: Jackey Loge D.O.   On: 03/26/2023 10:02   NM PET Image Initial (PI) Skull Base To Thigh (F-18 FDG) Result Date: 03/26/2023 CLINICAL DATA:  Initial treatment strategy for laryngeal cancer. EXAM: NUCLEAR MEDICINE PET SKULL BASE TO THIGH TECHNIQUE: 7.4 mCi F-18 FDG was injected intravenously. Full-ring PET imaging was performed from the skull base  to thigh after the radiotracer. CT data was obtained and used for attenuation correction and anatomic localization. Fasting blood glucose: 127 mg/dl COMPARISON:  CT neck 11/91/4782 FINDINGS: Mediastinal blood pool activity: SUV max 2.2 Liver activity: SUV max NA NECK: Left glottic and immediately supraglottic lesion has a maximum SUV of 11.4, compatible with malignancy. 0.7 cm left level III lymph node on image 65 series 602 has a maximum SUV of 4.0. Incidental CT findings: Chronic bilateral maxillary sinusitis. CHEST: Likely physiologic activity in the esophagus most notable in the distal esophagus with maximum SUV is 5.3. Incidental CT findings: Coronary, aortic arch, and branch vessel atherosclerotic vascular disease. Mild cardiomegaly. ABDOMEN/PELVIS: No significant abnormal hypermetabolic activity in this region. Incidental CT findings: Atherosclerosis is present, including aortoiliac atherosclerotic disease. SKELETON: No significant abnormal hypermetabolic activity in this region. Incidental CT findings: None. IMPRESSION: 1. Left glottic and immediately supraglottic lesion has a maximum SUV of 11.4, compatible with malignancy. 2. 0.7 cm left level III lymph node has a maximum SUV of 4.0, early metastatic involvement not excluded. 3. No findings of distant metastatic disease. 4. Chronic bilateral maxillary sinusitis. 5. Coronary, aortic arch, and branch vessel atherosclerotic vascular disease. Mild cardiomegaly. Aortic Atherosclerosis (ICD10-I70.0). Electronically Signed   By: Gaylyn Rong M.D.    On: 03/26/2023 09:11      IMPRESSION/PLAN:    ICD-10-CM   1. Malignant neoplasm of supraglottis (HCC) [C32.1]  C32.1 nicotine (NICODERM CQ - DOSED IN MG/24 HOURS) 21 mg/24hr patch    nicotine (NICODERM CQ - DOSED IN MG/24 HOURS) 14 mg/24hr patch    nicotine (NICODERM CQ - DOSED IN MG/24 HR) 7 mg/24hr patch    2. Malignant neoplasm of supraglottis (HCC)  C32.1 oxymetazoline (AFRIN) 0.05 % nasal spray 1 spray    lidocaine (XYLOCAINE) 2 % solution      Cancer Staging  Malignant neoplasm of supraglottis (HCC) Staging form: Larynx - Supraglottis, AJCC 8th Edition - Clinical stage from 03/26/2023: Stage III (cT1, cN1, cM0) - Unsigned Stage prefix: Initial diagnosis   This is a delightful patient with epiglottic/supraglottic head and neck cancer. I recommend radiotherapy for this patient. His burden of disease is not terribly bulky.  I would stage him as T1-T2 N1 M0.  I would favor this is T1 disease given that under anesthesia there was no evidence of glottic invasion per ENT.  I am not sure if he will receive a recommendation for concurrent chemotherapy but we will refer him to medical oncology for consideration of this.  We discussed the potential risks, benefits, and side effects of radiotherapy. We talked in detail about acute and late effects. We discussed that some of the most bothersome acute effects may be mucositis, dysgeusia, salivary changes, skin irritation, hair loss, dehydration, weight loss and fatigue. We talked about late effects which include but are not necessarily limited to dysphagia, hypothyroidism, nerve injury, vascular injury, spinal cord injury, xerostomia, trismus, neck edema, dental issues, non-healing wound, and potentially fatal injury to any of the tissues in the head and neck region. No guarantees of treatment were given. A consent form was signed and placed in the patient's medical record. The patient is enthusiastic about proceeding with treatment. I look forward  to participating in the patient's care.    Simulation (treatment planning) will take place in early January  We also discussed that the treatment of head and neck cancer is a multidisciplinary process to maximize treatment outcomes and quality of life. For this reason the following referrals have  been or will be made:   Medical oncology to discuss chemotherapy   Dentistry checkups and cleanings recommended regularly in the future due to possible dry mouth long term from RT - I will mold the radiation aggressively from his tooth roots.    Nutritionist for nutrition support during and after treatment. PEG tube may be needed if swallowing function worsens.  At this time he seems motivated to push his calories with nutritional shakes and soft foods.  Swallowing study is pending to rule out aspiration.   Speech language pathology for swallowing and/or speech therapy. MBSS ordered   Social work for social support.    Physical therapy due to risk of lymphedema in neck and deconditioning.   Baseline labs including TSH.  I asked the patient today about tobacco use. The patient uses tobacco.  I advised the patient to quit. Services were offered by me today including outpatient counseling and pharmacotherapy. I assessed for the willingness to attempt to quit and provided encouragement and demonstrated willingness to make referrals and/or prescriptions to help the patient attempt to quit. The patient has follow-up with the oncologic team to touch base on their tobacco use and /or cessation efforts.  Over 3 minutes were spent on this issue. He will quit within 7 days (by 04/01/23) and nicotine patches have been Rx'd to help.   On date of service, in total, I spent 75 minutes on this encounter. Patient was seen in person.  __________________________________________   Lonie Peak, MD  This document serves as a record of services personally performed by Lonie Peak, MD. It was created on her behalf by  Neena Rhymes, a trained medical scribe. The creation of this record is based on the scribe's personal observations and the provider's statements to them. This document has been checked and approved by the attending provider.

## 2023-03-26 ENCOUNTER — Encounter: Payer: Self-pay | Admitting: Radiation Oncology

## 2023-03-26 ENCOUNTER — Ambulatory Visit
Admission: RE | Admit: 2023-03-26 | Discharge: 2023-03-26 | Disposition: A | Discharge status: Home / Self Care | Payer: Medicare Other | Source: Ambulatory Visit | Attending: Radiation Oncology | Admitting: Radiation Oncology

## 2023-03-26 ENCOUNTER — Other Ambulatory Visit: Payer: Self-pay

## 2023-03-26 ENCOUNTER — Other Ambulatory Visit (HOSPITAL_COMMUNITY): Payer: Self-pay | Admitting: *Deleted

## 2023-03-26 VITALS — BP 140/87 | HR 76 | Temp 98.4°F | Resp 18 | Ht 64.0 in | Wt 152.4 lb

## 2023-03-26 DIAGNOSIS — Z8521 Personal history of malignant neoplasm of larynx: Secondary | ICD-10-CM | POA: Diagnosis not present

## 2023-03-26 DIAGNOSIS — Z809 Family history of malignant neoplasm, unspecified: Secondary | ICD-10-CM | POA: Diagnosis not present

## 2023-03-26 DIAGNOSIS — E7800 Pure hypercholesterolemia, unspecified: Secondary | ICD-10-CM | POA: Diagnosis not present

## 2023-03-26 DIAGNOSIS — Z8673 Personal history of transient ischemic attack (TIA), and cerebral infarction without residual deficits: Secondary | ICD-10-CM | POA: Diagnosis not present

## 2023-03-26 DIAGNOSIS — C321 Malignant neoplasm of supraglottis: Secondary | ICD-10-CM

## 2023-03-26 DIAGNOSIS — Z79899 Other long term (current) drug therapy: Secondary | ICD-10-CM | POA: Diagnosis not present

## 2023-03-26 DIAGNOSIS — F419 Anxiety disorder, unspecified: Secondary | ICD-10-CM | POA: Diagnosis not present

## 2023-03-26 DIAGNOSIS — K219 Gastro-esophageal reflux disease without esophagitis: Secondary | ICD-10-CM | POA: Diagnosis not present

## 2023-03-26 DIAGNOSIS — H9202 Otalgia, left ear: Secondary | ICD-10-CM | POA: Diagnosis not present

## 2023-03-26 DIAGNOSIS — I700 Atherosclerosis of aorta: Secondary | ICD-10-CM | POA: Diagnosis not present

## 2023-03-26 DIAGNOSIS — I6523 Occlusion and stenosis of bilateral carotid arteries: Secondary | ICD-10-CM | POA: Insufficient documentation

## 2023-03-26 DIAGNOSIS — Z800 Family history of malignant neoplasm of digestive organs: Secondary | ICD-10-CM | POA: Insufficient documentation

## 2023-03-26 DIAGNOSIS — M47812 Spondylosis without myelopathy or radiculopathy, cervical region: Secondary | ICD-10-CM | POA: Diagnosis not present

## 2023-03-26 DIAGNOSIS — J3489 Other specified disorders of nose and nasal sinuses: Secondary | ICD-10-CM | POA: Diagnosis not present

## 2023-03-26 DIAGNOSIS — F17210 Nicotine dependence, cigarettes, uncomplicated: Secondary | ICD-10-CM | POA: Diagnosis not present

## 2023-03-26 DIAGNOSIS — J320 Chronic maxillary sinusitis: Secondary | ICD-10-CM | POA: Insufficient documentation

## 2023-03-26 DIAGNOSIS — R059 Cough, unspecified: Secondary | ICD-10-CM

## 2023-03-26 DIAGNOSIS — I10 Essential (primary) hypertension: Secondary | ICD-10-CM | POA: Diagnosis not present

## 2023-03-26 DIAGNOSIS — R1310 Dysphagia, unspecified: Secondary | ICD-10-CM

## 2023-03-26 MED ORDER — NICOTINE 21 MG/24HR TD PT24: 21.0000 mg | MEDICATED_PATCH | Freq: Every day | TRANSDERMAL | 2 refills | Status: DC

## 2023-03-26 MED ORDER — NICOTINE 7 MG/24HR TD PT24: 7.0000 mg | MEDICATED_PATCH | Freq: Every day | TRANSDERMAL | 0 refills | Status: DC

## 2023-03-26 MED ORDER — NICOTINE 14 MG/24HR TD PT24: 14.0000 mg | MEDICATED_PATCH | Freq: Every day | TRANSDERMAL | 0 refills | Status: DC

## 2023-03-26 MED ORDER — OXYMETAZOLINE HCL 0.05 % NA SOLN
1.0000 | Freq: Once | NASAL | Status: AC
  Administered 2023-03-26: 1 via NASAL
  Filled 2023-03-26: qty 30

## 2023-03-26 MED ORDER — LIDOCAINE VISCOUS HCL 2 % MT SOLN: OROMUCOSAL | 3 refills | Status: DC

## 2023-03-26 NOTE — Progress Notes (Signed)
Oncology Nurse Navigator Documentation   Met with patient during initial consult with Dr. Basilio Cairo. He was accompanied by his wife, [name]. Further introduced myself as his/their Navigator, explained my role as a member of the Care Team. Provided New Patient resource guide binder: Contact information for physicians, this navigator, other members of the Care Team Advance Directive information; provided [facility] AD booklet at their request,  Fall Prevention Patient Safety Plan Financial Assistance Information sheet Symptom Management Clinic information [facility] map with highlight of [facility] SLP Information sheet Head and Neck cancer basics Nutrition information Patient and family support information including Spiritual care/Chaplain information, Peer mentor program, health and wellness classes, and the survivorship program Community resources  Provided and discussed educational handouts for PEG and PAC. Assisted with post-consult appt scheduling. They verbalized understanding of information provided. I encouraged them to call with questions/concerns moving forward.  Hedda Slade, RN, BSN, OCN Head & Neck Oncology Nurse Navigator [facility] [place] [place] [phone]

## 2023-03-26 NOTE — Addendum Note (Signed)
Encounter addended by: Benard Halsted, LPN on: 25/95/6387 8:27 AM  Actions taken: Check In activity completed

## 2023-03-29 ENCOUNTER — Encounter: Payer: Self-pay | Admitting: Internal Medicine

## 2023-03-29 ENCOUNTER — Ambulatory Visit (INDEPENDENT_AMBULATORY_CARE_PROVIDER_SITE_OTHER): Payer: Medicare Other | Admitting: Internal Medicine

## 2023-03-29 VITALS — BP 137/86 | HR 71 | Ht 64.0 in | Wt 155.4 lb

## 2023-03-29 DIAGNOSIS — R413 Other amnesia: Secondary | ICD-10-CM

## 2023-03-29 DIAGNOSIS — F333 Major depressive disorder, recurrent, severe with psychotic symptoms: Secondary | ICD-10-CM | POA: Diagnosis not present

## 2023-03-29 DIAGNOSIS — C321 Malignant neoplasm of supraglottis: Secondary | ICD-10-CM | POA: Diagnosis not present

## 2023-03-29 DIAGNOSIS — I10 Essential (primary) hypertension: Secondary | ICD-10-CM | POA: Diagnosis not present

## 2023-03-29 DIAGNOSIS — F332 Major depressive disorder, recurrent severe without psychotic features: Secondary | ICD-10-CM

## 2023-03-29 MED ORDER — FLUOXETINE HCL 20 MG PO CAPS: 20.0000 mg | ORAL_CAPSULE | Freq: Every day | ORAL | 3 refills | Status: DC

## 2023-03-29 NOTE — Assessment & Plan Note (Addendum)
Uncontrolled Had started Prozac 20 mg once daily, advised to stay compliant Followed by Psychiatry - Dr. Adrian Blackwater Check iron profile, B12 and folic acid level Check CMP, TSH - wnl

## 2023-03-29 NOTE — Patient Instructions (Signed)
Please continue taking Prozac - Fluoxetine as prescribed.  Please follow up with Dr Adrian Blackwater regarding Seroquel.  Please continue to take medications as prescribed.  Please continue to follow low salt diet and ambulate as tolerated.

## 2023-03-30 LAB — IRON,TIBC AND FERRITIN PANEL
Ferritin: 229 ng/mL (ref 30–400)
Iron Saturation: 35 % (ref 15–55)
Iron: 114 ug/dL (ref 38–169)
Total Iron Binding Capacity: 322 ug/dL (ref 250–450)
UIBC: 208 ug/dL (ref 111–343)

## 2023-03-30 LAB — VITAMIN B12: Vitamin B-12: 631 pg/mL (ref 232–1245)

## 2023-03-30 LAB — FOLATE: Folate: 5.7 ng/mL (ref >3.0)

## 2023-03-30 NOTE — Assessment & Plan Note (Addendum)
 BP Readings from Last 1 Encounters:  03/29/23 137/86   Well-controlled with Azor 10-20 mg QD and metoprolol 25 mg QD Counseled for compliance with the medications Advised DASH diet and moderate exercise/walking, at least 150 mins/week

## 2023-03-30 NOTE — Assessment & Plan Note (Signed)
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

## 2023-03-30 NOTE — Assessment & Plan Note (Signed)
Recent diagnosis Followed by ENT specialist and radiation oncology

## 2023-04-02 ENCOUNTER — Other Ambulatory Visit: Payer: Self-pay

## 2023-04-02 ENCOUNTER — Other Ambulatory Visit (HOSPITAL_COMMUNITY): Payer: Medicare Other

## 2023-04-02 DIAGNOSIS — C321 Malignant neoplasm of supraglottis: Secondary | ICD-10-CM

## 2023-04-05 ENCOUNTER — Telehealth (HOSPITAL_COMMUNITY): Payer: Medicare Other | Admitting: Psychiatry

## 2023-04-05 ENCOUNTER — Other Ambulatory Visit: Payer: Self-pay

## 2023-04-05 ENCOUNTER — Encounter (HOSPITAL_COMMUNITY): Payer: Self-pay | Admitting: Psychiatry

## 2023-04-05 ENCOUNTER — Ambulatory Visit
Admission: RE | Admit: 2023-04-05 | Discharge: 2023-04-05 | Disposition: A | Discharge status: Home / Self Care | Payer: Medicare Other | Source: Ambulatory Visit | Attending: Radiation Oncology | Admitting: Radiation Oncology

## 2023-04-05 VITALS — BP 140/89 | HR 62 | Temp 99.0°F | Resp 17 | Wt 151.0 lb

## 2023-04-05 DIAGNOSIS — Z5111 Encounter for antineoplastic chemotherapy: Secondary | ICD-10-CM | POA: Diagnosis not present

## 2023-04-05 DIAGNOSIS — Z923 Personal history of irradiation: Secondary | ICD-10-CM | POA: Diagnosis not present

## 2023-04-05 DIAGNOSIS — F333 Major depressive disorder, recurrent, severe with psychotic symptoms: Secondary | ICD-10-CM | POA: Diagnosis not present

## 2023-04-05 DIAGNOSIS — Z801 Family history of malignant neoplasm of trachea, bronchus and lung: Secondary | ICD-10-CM | POA: Insufficient documentation

## 2023-04-05 DIAGNOSIS — Z510 Encounter for antineoplastic radiation therapy: Secondary | ICD-10-CM | POA: Insufficient documentation

## 2023-04-05 DIAGNOSIS — F411 Generalized anxiety disorder: Secondary | ICD-10-CM

## 2023-04-05 DIAGNOSIS — F419 Anxiety disorder, unspecified: Secondary | ICD-10-CM | POA: Diagnosis not present

## 2023-04-05 DIAGNOSIS — I6523 Occlusion and stenosis of bilateral carotid arteries: Secondary | ICD-10-CM | POA: Diagnosis not present

## 2023-04-05 DIAGNOSIS — K219 Gastro-esophageal reflux disease without esophagitis: Secondary | ICD-10-CM | POA: Insufficient documentation

## 2023-04-05 DIAGNOSIS — I700 Atherosclerosis of aorta: Secondary | ICD-10-CM | POA: Diagnosis not present

## 2023-04-05 DIAGNOSIS — Z79899 Other long term (current) drug therapy: Secondary | ICD-10-CM | POA: Diagnosis not present

## 2023-04-05 DIAGNOSIS — F1994 Other psychoactive substance use, unspecified with psychoactive substance-induced mood disorder: Secondary | ICD-10-CM | POA: Insufficient documentation

## 2023-04-05 DIAGNOSIS — F4001 Agoraphobia with panic disorder: Secondary | ICD-10-CM | POA: Diagnosis not present

## 2023-04-05 DIAGNOSIS — E7800 Pure hypercholesterolemia, unspecified: Secondary | ICD-10-CM | POA: Diagnosis not present

## 2023-04-05 DIAGNOSIS — Z800 Family history of malignant neoplasm of digestive organs: Secondary | ICD-10-CM | POA: Insufficient documentation

## 2023-04-05 DIAGNOSIS — C321 Malignant neoplasm of supraglottis: Secondary | ICD-10-CM

## 2023-04-05 DIAGNOSIS — D6959 Other secondary thrombocytopenia: Secondary | ICD-10-CM | POA: Insufficient documentation

## 2023-04-05 DIAGNOSIS — J320 Chronic maxillary sinusitis: Secondary | ICD-10-CM | POA: Insufficient documentation

## 2023-04-05 DIAGNOSIS — Z87891 Personal history of nicotine dependence: Secondary | ICD-10-CM | POA: Diagnosis not present

## 2023-04-05 DIAGNOSIS — R609 Edema, unspecified: Secondary | ICD-10-CM | POA: Insufficient documentation

## 2023-04-05 DIAGNOSIS — R413 Other amnesia: Secondary | ICD-10-CM | POA: Diagnosis not present

## 2023-04-05 DIAGNOSIS — Z7952 Long term (current) use of systemic steroids: Secondary | ICD-10-CM | POA: Diagnosis not present

## 2023-04-05 DIAGNOSIS — Z8673 Personal history of transient ischemic attack (TIA), and cerebral infarction without residual deficits: Secondary | ICD-10-CM | POA: Insufficient documentation

## 2023-04-05 DIAGNOSIS — I119 Hypertensive heart disease without heart failure: Secondary | ICD-10-CM | POA: Insufficient documentation

## 2023-04-05 DIAGNOSIS — F17200 Nicotine dependence, unspecified, uncomplicated: Secondary | ICD-10-CM

## 2023-04-05 LAB — CMP (CANCER CENTER ONLY)
ALT: 13 U/L (ref 0–44)
AST: 21 U/L (ref 15–41)
Albumin: 4.4 g/dL (ref 3.5–5.0)
Alkaline Phosphatase: 60 U/L (ref 38–126)
Anion gap: 7 (ref 5–15)
BUN: 10 mg/dL (ref 6–20)
CO2: 26 mmol/L (ref 22–32)
Calcium: 9.4 mg/dL (ref 8.9–10.3)
Chloride: 105 mmol/L (ref 98–111)
Creatinine: 0.89 mg/dL (ref 0.61–1.24)
GFR, Estimated: >60 mL/min (ref >60)
Glucose, Bld: 101 mg/dL — ABNORMAL HIGH (ref 70–99)
Potassium: 4.1 mmol/L (ref 3.5–5.1)
Sodium: 138 mmol/L (ref 135–145)
Total Bilirubin: 1 mg/dL (ref 0.0–1.2)
Total Protein: 7.8 g/dL (ref 6.5–8.1)

## 2023-04-05 MED ORDER — QUETIAPINE FUMARATE 50 MG PO TABS: ORAL_TABLET | ORAL | 0 refills | Status: DC

## 2023-04-05 MED ORDER — SODIUM CHLORIDE 0.9% FLUSH
10.0000 mL | Freq: Once | INTRAVENOUS | Status: AC
  Administered 2023-04-05: 10 mL via INTRAVENOUS

## 2023-04-05 MED ORDER — QUETIAPINE FUMARATE 200 MG PO TABS: 200.0000 mg | ORAL_TABLET | Freq: Every day | ORAL | 2 refills | Status: DC
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
## 2023-04-05 NOTE — Patient Instructions (Signed)
 We started Seroquel  50 mg nightly today.  Take Seroquel  50 mg nightly for 1 week.  Then increase to 100 mg nightly for 1 week.  Then increase to 150 mg nightly for 1 week.  Then increase to 200 mg nightly and her next prescription should be available which will be a 200 mg tablet and he will take only 1 of these per night.

## 2023-04-05 NOTE — Progress Notes (Signed)
 Has armband been applied?  Yes.    Does patient have an allergy to IV contrast dye?: No.   Has patient ever received premedication for IV contrast dye?: No.   Date of lab work: April 05, 2023 BUN: 10 CR: 0.89 eGFR: >60  Does patient take metformin?: No.  Is eGFR >60?: Yes.   If no, when can patient resume? (Must be 48 hrs AFTER they receive IV contrast):  N/A  IV site: forearm left, condition patent and no redness  Has IV site been added to flowsheet?  Yes.     BP (!) 140/89 (Patient Position: Sitting, Cuff Size: Normal)   Pulse 62   Temp 99 F (37.2 C) (Oral)   Resp 17   Wt 151 lb (68.5 kg)   SpO2 100%   BMI 25.92 kg/m

## 2023-04-09 ENCOUNTER — Inpatient Hospital Stay: Payer: Medicare Other | Admitting: Oncology

## 2023-04-09 ENCOUNTER — Inpatient Hospital Stay: Payer: Medicare Other

## 2023-04-09 ENCOUNTER — Inpatient Hospital Stay (HOSPITAL_BASED_OUTPATIENT_CLINIC_OR_DEPARTMENT_OTHER): Payer: Medicare Other | Admitting: Oncology

## 2023-04-09 ENCOUNTER — Other Ambulatory Visit: Payer: Self-pay

## 2023-04-09 VITALS — BP 142/88 | HR 64 | Temp 98.2°F | Resp 17 | Wt 151.8 lb

## 2023-04-09 DIAGNOSIS — I700 Atherosclerosis of aorta: Secondary | ICD-10-CM | POA: Insufficient documentation

## 2023-04-09 DIAGNOSIS — C321 Malignant neoplasm of supraglottis: Secondary | ICD-10-CM | POA: Insufficient documentation

## 2023-04-09 DIAGNOSIS — Z8673 Personal history of transient ischemic attack (TIA), and cerebral infarction without residual deficits: Secondary | ICD-10-CM | POA: Insufficient documentation

## 2023-04-09 DIAGNOSIS — J320 Chronic maxillary sinusitis: Secondary | ICD-10-CM | POA: Insufficient documentation

## 2023-04-09 DIAGNOSIS — K219 Gastro-esophageal reflux disease without esophagitis: Secondary | ICD-10-CM | POA: Insufficient documentation

## 2023-04-09 DIAGNOSIS — Z923 Personal history of irradiation: Secondary | ICD-10-CM | POA: Insufficient documentation

## 2023-04-09 DIAGNOSIS — I119 Hypertensive heart disease without heart failure: Secondary | ICD-10-CM | POA: Insufficient documentation

## 2023-04-09 DIAGNOSIS — Z800 Family history of malignant neoplasm of digestive organs: Secondary | ICD-10-CM | POA: Insufficient documentation

## 2023-04-09 DIAGNOSIS — I6523 Occlusion and stenosis of bilateral carotid arteries: Secondary | ICD-10-CM | POA: Insufficient documentation

## 2023-04-09 DIAGNOSIS — Z7952 Long term (current) use of systemic steroids: Secondary | ICD-10-CM | POA: Insufficient documentation

## 2023-04-09 DIAGNOSIS — D6959 Other secondary thrombocytopenia: Secondary | ICD-10-CM | POA: Insufficient documentation

## 2023-04-09 DIAGNOSIS — Z79899 Other long term (current) drug therapy: Secondary | ICD-10-CM | POA: Insufficient documentation

## 2023-04-09 DIAGNOSIS — R609 Edema, unspecified: Secondary | ICD-10-CM | POA: Insufficient documentation

## 2023-04-09 DIAGNOSIS — E7800 Pure hypercholesterolemia, unspecified: Secondary | ICD-10-CM | POA: Insufficient documentation

## 2023-04-09 DIAGNOSIS — Z801 Family history of malignant neoplasm of trachea, bronchus and lung: Secondary | ICD-10-CM | POA: Insufficient documentation

## 2023-04-09 DIAGNOSIS — Z5111 Encounter for antineoplastic chemotherapy: Secondary | ICD-10-CM | POA: Insufficient documentation

## 2023-04-09 DIAGNOSIS — Z510 Encounter for antineoplastic radiation therapy: Secondary | ICD-10-CM | POA: Diagnosis not present

## 2023-04-09 DIAGNOSIS — F1994 Other psychoactive substance use, unspecified with psychoactive substance-induced mood disorder: Secondary | ICD-10-CM | POA: Insufficient documentation

## 2023-04-09 DIAGNOSIS — F419 Anxiety disorder, unspecified: Secondary | ICD-10-CM | POA: Insufficient documentation

## 2023-04-09 DIAGNOSIS — Z87891 Personal history of nicotine dependence: Secondary | ICD-10-CM | POA: Insufficient documentation

## 2023-04-09 NOTE — Progress Notes (Signed)
 Oncology Nurse Navigator Documentation   Met with patient during initial consult with Dr. Autumn.   Further introduced myself as his Navigator, explained my role as a member of the Care Team. Provided and discussed educational handouts for PEG and PAC. Assisted with post-consult appt scheduling. He verbalized understanding of information provided. I encouraged them to call with questions/concerns moving forward.  Delon Jefferson, RN, BSN, OCN Head & Neck Oncology Nurse Navigator [facility] [phone]

## 2023-04-09 NOTE — Progress Notes (Addendum)
 [facility]  ONCOLOGY CONSULT NOTE   PATIENT NAME: [name]   MR#: [id number] DOB: [birth year]  DATE OF SERVICE: 04/09/2023   REFERRING PHYSICIAN  Lauraine Golden, MD.   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  ASSESSMENT & PLAN:   [name] is a 53 y.o. gentleman with a past medical history of nicotine  dependence, GERD, was referred to our clinic for recently diagnosed squamous cell carcinoma of the supraglottis, stage III (cT1, cN1, cM0).  Malignant neoplasm of supraglottis (HCC) Supraglottic cancer with one suspicious 7 mm lymph node on PET scan, classified as T1 vs T3, N1, stage III, low volume disease.   Treatment options include radiation alone or combined with chemotherapy. Chemotherapy enhances radiation efficacy. Discussed risks (neuropathy, kidney dysfunction, tinnitus, hearing loss, blood count drops, nausea, fatigue, electrolyte disturbances) and benefits of chemotherapy. Majority of T1,N1 tumors can be treated with radiation alone.  If it is clinical T3 lesion, concurrent chemotherapy and radiation will be offered.    -He is scheduled to begin radiation treatments from 04/14/2023.  - Discuss case in conference next Wednesday, 04/14/2023.  If consensus opinion is to proceed with concurrent chemotherapy during radiation, we will arrange for chemo education, port cath placement prior to proceeding with chemo.  If proceeding with chemotherapy, we will treat him with cisplatin  40 mg/m weekly during the course of radiation.  I will plan to see him in clinic following discussion in tumor conference.  I reviewed  lab results and outside records for this visit and discussed relevant results with the patient. Diagnosis, plan of care and treatment options were also discussed in detail with the patient. Opportunity provided to ask questions and answers provided to his apparent satisfaction. Provided instructions to call our clinic with any problems, questions or concerns prior to return visit. I recommended to continue follow-up with PCP and sub-specialists. He verbalized understanding and agreed with the plan. No barriers to learning was detected.  NCCN guidelines have been consulted in the planning of this patient's care.  Chinita Patten, MD  04/09/2023 9:56 AM  [facility] - A DEPT OF [facility] [address] [place] [address] Dept: [phone] Dept Fax: [phone]   HISTORY OF PRESENTING ILLNESS:   Discussed the use of AI scribe software for clinical note transcription with the patient, who gave verbal consent to proceed.   I have reviewed his chart and materials related to his cancer extensively and collaborated history with the patient. Summary of oncologic history is as follows:  ONCOLOGY HISTORY:  He initially presented to the [facility] on 10/28/22 with c/o left ear pain, popping with chewing, and pain radiating down the side of his neck and throat x 1 month. He was treated for a left ear infection with a course of prednisone .   His symptoms including left ear and throat pain however persisted and progressed and he was subsequently referred to Dr. Karis on 03/10/23 at [facility] for further evaluation. During which time, the patient detailed that his pain first began in the ear and eventually progressed to involve the left side of his  throat.  A laryngoscopy was subsequently performed at that time which revealed an ulcerative lesion on the posterior aspect of the epiglottis, superior to the vocal cords. The arytenoid  mucosa was also mildly edematous and the true vocal folds were pale yellow, mobile, and without mass or lesion.       He accordingly underwent a direct laryngoscopy with biopsies of the left supraglottic tumor/mass on 03/16/23. Pathology showed findings consistent with invasive and in situ moderately differentiated squamous cell carcinoma. Per note by Dr Karis, Examination of the vallecula, piriform sinuses, and the pharyngeal mucosa were all normal. A fungating mass was noted on the posterior aspect of the left epiglottis. The mass did not appear to involve the vocal cords.   Soft tissue neck CT and PET scan on 03/23/23 were done - results just came back demonstrating a left supraglottic lesion which may involve the glottis as well as a 7 mm left level 3 lymph node that is suspicious for metastatic involvement.  No distant metastatic disease.  INTERVAL HISTORY:  He reports persistent ear pain, voice changes for about three months, and difficulty swallowing. The patient initially thought the symptoms were due to sinus issues. The patient has been experiencing weight loss due to decreased food intake but has been compensating with increased water  and Ensure intake. The patient is currently trying to quit smoking using nicotine  patches.   MEDICAL HISTORY:  Past Medical History:  Diagnosis Date   Anxiety    Bipolar 1 disorder (HCC)    EtOH dependence (HCC)    QUIT   GERD without esophagitis    Hepatitis C carrier (HCC)    Hypercholesteremia    Hypertension    laryngeal ca 02/2023   Polysubstance dependence (HCC)    COCAINE- quit   Stroke [facility])    has some ST memory deficits from stroke.   Substance induced mood disorder (HCC) 11/05/2016    SURGICAL HISTORY: Past Surgical History:  Procedure Laterality Date   ANEURYSM COILING     AGE 22 x2   APPENDECTOMY     BIOPSY  01/05/2017   Procedure: BIOPSY;  Surgeon: [name], MD;  Location[facility];  Service: Endoscopy;;  Duodenal  and Gastric   COLONOSCOPY WITH PROPOFOL  N/A 01/05/2017   Procedure: COLONOSCOPY WITH PROPOFOL ;  Surgeon: [name], MD;  Location: [facility];  Service: Endoscopy;  Laterality: N/A;  10:00am   DIRECT LARYNGOSCOPY Left 03/16/2023   Procedure: Direct laryngoscopy with biopsy of supraglottic mass;  Surgeon: [name], MD;  Location: [facility];  Service: ENT;  Laterality: Left;   ESOPHAGOGASTRODUODENOSCOPY (EGD) WITH PROPOFOL  N/A 01/05/2017   Procedure: ESOPHAGOGASTRODUODENOSCOPY (EGD) WITH PROPOFOL ;  Surgeon: [name], MD;  Location: [facility];  Service: Endoscopy;  Laterality: N/A;   LAPAROSCOPIC APPENDECTOMY     MASS EXCISION Right 01/13/2017   Procedure: EXCISION LIPOMA RIGHT ARM;  Surgeon: [name], MD;  Location: [facility];  Service: General;  Laterality: Right;    SOCIAL HISTORY: Social History   Socioeconomic History   Marital status: Married    Spouse name: Not on file   Number of children: Not on file   Years of education: Not on file   Highest education level: Not on file  Occupational History   Not on file  Tobacco Use   Smoking status: Former    Current packs/day: 0.25    Average packs/day: 0.3 packs/day for 25.0 years (6.3 ttl pk-yrs)  Types: Cigarettes   Smokeless tobacco: Never   Tobacco comments:    More than 2 packs/day as of 03/18/2023 but quit around the new year  Vaping Use   Vaping status: Every Day   Substances: Nicotine   Substance and Sexual Activity   Alcohol use: Yes    Alcohol/week: 1.0 standard drink of alcohol    Types: 1 Cans of beer per week    Comment: 1 beer at a time when consuming but unclear how frequent.   Drug use: Not Currently    Types: Crack cocaine, Cocaine, Marijuana    Comment: last positive in 2020.  Ssta used last 2 months ago.   Sexual activity: Yes    Birth control/protection: Condom  Other Topics Concern   Not on file  Social History Narrative   NOW ON SHORT TERM DISABILITY. 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   MARRIED-3 WITH WIFE AND 3 PREVIOUS.   Social Drivers of Corporate Investment Banker Strain: Not on file  Food Insecurity: No Food Insecurity (03/26/2023)   Hunger Vital Sign    Worried About Running Out of Food in the Last Year: Never true    Ran Out of Food in the Last Year: Never true  Transportation Needs: No Transportation Needs (03/26/2023)   PRAPARE - Administrator, Civil Service (Medical): No    Lack of Transportation (Non-Medical): No  Physical Activity: Not on file  Stress: Not on file  Social Connections: Not on file  Intimate Partner Violence: Unknown (03/26/2023)   Humiliation, Afraid, Rape, and Kick questionnaire    Fear of Current or Ex-Partner: No    Emotionally Abused: No    Physically Abused: No    Sexually Abused: Patient declined    FAMILY HISTORY: Family History  Problem Relation Age of Onset   Cancer Mother        Lung   Stomach cancer Mother    Cancer Sister        Bone cancer   Bone cancer Sister 21   Cancer Brother        3 brothers with prostate cancer   Colon cancer Brother 40   Colon polyps Neg Hx     ALLERGIES:  He has no known allergies.  MEDICATIONS:  Current Outpatient Medications  Medication Sig Dispense Refill   amlodipine -olmesartan  (AZOR ) 10-20 MG tablet Take 1 tablet by mouth daily. 30 tablet 1   famotidine  (PEPCID ) 40 MG tablet Take 1 tablet (40 mg total) by mouth daily. 30 tablet 3   FLUoxetine  (PROZAC ) 20 MG capsule Take 1 capsule (20 mg total) by mouth daily. 30 capsule 3   fluticasone  (FLONASE ) 50 MCG/ACT nasal spray Place 1 spray into both nostrils 2 (two) times daily. 16 g 2   hydrOXYzine  (ATARAX ) 25 MG tablet Take 1 tablet (25 mg total) by mouth 3 (three) times daily as needed for anxiety. 90 tablet 1   lidocaine  (XYLOCAINE ) 2 % solution Patient: Mix 1part 2% viscous lidocaine , 1part H20. Swallow 10mL of diluted mixture, before meals and at bedtime, up to QID  200 mL 3   metoprolol  succinate (TOPROL -XL) 25 MG 24 hr tablet Take 1 tablet (25 mg total) by mouth daily. 30 tablet 1   nicotine  (NICODERM CQ  - DOSED IN MG/24 HOURS) 14 mg/24hr patch Place 1 patch (14 mg total) onto the skin daily. Apply 21 mg patch daily x 6 wk, then 14mg  patch daily x 2 wk, then 7 mg  patch daily x 2 wk 14 patch 0   nicotine  (NICODERM CQ  - DOSED IN MG/24 HOURS) 21 mg/24hr patch Place 1 patch (21 mg total) onto the skin daily. Apply 21 mg patch daily x 6 wk, then 14mg  patch daily x 2 wk, then 7 mg patch daily x 2 wk 14 patch 2   nicotine  (NICODERM CQ  - DOSED IN MG/24 HR) 7 mg/24hr patch Place 1 patch (7 mg total) onto the skin daily. Apply 21 mg patch daily x 6 wk, then 14mg  patch daily x 2 wk, then 7 mg patch daily x 2 wk 14 patch 0   [START ON 05/02/2023] QUEtiapine  (SEROQUEL ) 200 MG tablet Take 1 tablet (200 mg total) by mouth at bedtime. (Patient not taking: Reported on 04/09/2023) 30 tablet 2   QUEtiapine  (SEROQUEL ) 50 MG tablet Take one pill nightly for 1 week. Then increase to 2 pills nightly for 1 week. Then increase to 3 pills nightly for one week. Then increase to 4 pills nightly for one week. (Patient not taking: Reported on 04/09/2023) 90 tablet 0   No current facility-administered medications for this visit.    REVIEW OF SYSTEMS:    Review of Systems - Oncology  All other pertinent systems were reviewed with the patient and are negative.  PHYSICAL EXAMINATION:  ECOG PERFORMANCE STATUS: 1 - Symptomatic but completely ambulatory  Vitals:   04/09/23 0939  BP: (!) 142/88  Pulse: 64  Resp: 17  Temp: 98.2 F (36.8 C)  SpO2: 98%   Filed Weights   04/09/23 0939  Weight: 151 lb 12.8 oz (68.9 kg)    Physical Exam Constitutional:      General: He is not in acute distress.    Appearance: Normal appearance.  HENT:     Head: Normocephalic and atraumatic.  Eyes:     General: No scleral icterus.    Conjunctiva/sclera: Conjunctivae normal.  Cardiovascular:      Rate and Rhythm: Normal rate and regular rhythm.     Heart sounds: Normal heart sounds.  Pulmonary:     Effort: Pulmonary effort is normal.     Breath sounds: Normal breath sounds.  Abdominal:     General: There is no distension.  Musculoskeletal:     Right lower leg: No edema.     Left lower leg: No edema.  Lymphadenopathy:     Cervical: No cervical adenopathy.  Neurological:     General: No focal deficit present.     Mental Status: He is alert and oriented to person, place, and time.  Psychiatric:        Mood and Affect: Mood normal.        Behavior: Behavior normal.        Thought Content: Thought content normal.     LABORATORY DATA:   I have reviewed the data as listed.  No results found for any visits on 04/09/23.  Lab Results  Component Value Date   WBC 6.6 02/04/2023   HGB 14.5 02/04/2023   HCT 43.5 02/04/2023   MCV 89 02/04/2023   PLT 215 02/04/2023   Recent Labs    02/04/23 1354 04/05/23 0806  NA 140 138  K 4.7 4.1  CL 103 105  CO2 24 26  GLUCOSE 75 101*  BUN 10 10  CREATININE 0.90 0.89  CALCIUM 9.4 9.4  GFRNONAA  --  >60  PROT 7.3 7.8  ALBUMIN 4.4 4.4  AST 19 21  ALT 14 13  ALKPHOS 67 60  BILITOT  0.3 1.0     RADIOGRAPHIC STUDIES:  I have personally reviewed the radiological images as listed and agree with the findings in the report.  CT Soft Tissue Neck W Contrast Result Date: 03/26/2023 CLINICAL DATA:  Provided history: Laryngeal cancer. Head/neck cancer, staging. EXAM: CT NECK WITH CONTRAST TECHNIQUE: Multidetector CT imaging of the neck was performed using the standard protocol following the bolus administration of intravenous contrast. RADIATION DOSE REDUCTION: This exam was performed according to the departmental dose-optimization program which includes automated exposure control, adjustment of the mA and/or kV according to patient size and/or use of iterative reconstruction technique. CONTRAST:  75mL OMNIPAQUE  IOHEXOL  300 MG/ML  SOLN  COMPARISON:  PET CT 03/23/2023. FINDINGS: Pharynx and larynx: Enhancing mucosal/submucosal lesion along the laryngeal surface of the epiglottis on the left, along the left aryepiglottic fold, more inferiorly within the supraglottic larynx at midline and to the left and extending inferiorly to the level of the false vocal cord. The lesion measures up to 8 mm in thickness and spans 3 cm in craniocaudal dimension (series 2, image 66) (series 604, image 38). The tumor partially effaces the left pyriform sinus. Additionally, there is apparent infiltration of the paraglottic space on the left (series 2, image 61). No definite involvement of the true vocal cords. Salivary glands: No inflammation, mass, or stone. Thyroid : Unremarkable. Lymph nodes: No pathologically enlarged cervical chain lymph nodes identified. However, please refer to the same-day PET-CT for a description a hypermetabolic left level III lymph node. Vascular: The major vascular structures of the neck are patent. Minimal atherosclerotic plaque about the carotid bifurcations bilaterally. Limited intracranial: No evidence of an acute intracranial abnormality within the field of view. Visualized orbits: Incompletely imaged. No orbital mass or acute orbital finding. Mastoids and visualized paranasal sinuses: The frontal sinuses are excluded from the field of view. Portions of the ethmoid sinuses are also excluded from the field of view superiorly. 2.7 cm mucous retention cyst within the left maxillary sinus inferiorly. Polypoid mucosal thickening more superiorly within the left maxillary sinus, and within the right maxillary sinus. No significant mastoid effusion. Skeleton: Cervical spondylosis. No acute fracture or aggressive osseous lesion. Upper chest: No consolidation or nodule within the imaged lung apices. IMPRESSION: 1. Enhancing mucosal/submucosal lesion along the laryngeal surface of the epiglottis on the left, along the left aryepiglottic fold, more  inferiorly within the supraglottic larynx at midline and to the left and extending inferiorly to the level of the false vocal cord. This is consistent with a mucosal/submucosal neoplasm. The lesion measures up to 8 mm in thickness and spans 3 cm in craniocaudal dimension. The tumor partially effaces the left pyriform sinus. Additionally, there is apparent infiltration of the paraglottic space on the left. No definite involvement of the true vocal cords. However, correlate with findings at pharyngolaryngoscopy. 2. Please refer to the same-day PET-CT for a description a hypermetabolic left level III lymph node. 3. Paranasal sinus disease as described. Electronically Signed   By: Rockey Childs D.O.   On: 03/26/2023 10:02   NM PET Image Initial (PI) Skull Base To Thigh (F-18 FDG) Result Date: 03/26/2023 CLINICAL DATA:  Initial treatment strategy for laryngeal cancer. EXAM: NUCLEAR MEDICINE PET SKULL BASE TO THIGH TECHNIQUE: 7.4 mCi F-18 FDG was injected intravenously. Full-ring PET imaging was performed from the skull base to thigh after the radiotracer. CT data was obtained and used for attenuation correction and anatomic localization. Fasting blood glucose: 127 mg/dl COMPARISON:  CT neck 87/75/7975 FINDINGS: Mediastinal blood pool  activity: SUV max 2.2 Liver activity: SUV max NA NECK: Left glottic and immediately supraglottic lesion has a maximum SUV of 11.4, compatible with malignancy. 0.7 cm left level III lymph node on image 65 series 602 has a maximum SUV of 4.0. Incidental CT findings: Chronic bilateral maxillary sinusitis. CHEST: Likely physiologic activity in the esophagus most notable in the distal esophagus with maximum SUV is 5.3. Incidental CT findings: Coronary, aortic arch, and branch vessel atherosclerotic vascular disease. Mild cardiomegaly. ABDOMEN/PELVIS: No significant abnormal hypermetabolic activity in this region. Incidental CT findings: Atherosclerosis is present, including aortoiliac  atherosclerotic disease. SKELETON: No significant abnormal hypermetabolic activity in this region. Incidental CT findings: None. IMPRESSION: 1. Left glottic and immediately supraglottic lesion has a maximum SUV of 11.4, compatible with malignancy. 2. 0.7 cm left level III lymph node has a maximum SUV of 4.0, early metastatic involvement not excluded. 3. No findings of distant metastatic disease. 4. Chronic bilateral maxillary sinusitis. 5. Coronary, aortic arch, and branch vessel atherosclerotic vascular disease. Mild cardiomegaly. Aortic Atherosclerosis (ICD10-I70.0). Electronically Signed   By: Ryan Salvage M.D.   On: 03/26/2023 09:11    No orders of the defined types were placed in this encounter.   CODE STATUS:  Code Status History     Date Active Date Inactive Code Status Order ID Comments User Context   06/23/2017 1540 06/28/2017 2102 Full Code [id number]  Janifer Mitzie Retort, NP Inpatient   06/23/2017 0327 06/23/2017 1502 Full Code [id number]  Merilee Harlene NOVAK PA-C ED   01/13/2017 0858 01/13/2017 1329 Full Code [id number]  [name], MD Inpatient   11/05/2016 0018 11/10/2016 1937 Full Code [id number]  Ray Jacques BRAVO, PA-C Inpatient       Future Appointments  Date Time Provider Department Center  04/09/2023 10:45 AM [facility] LAB [facility] None  04/14/2023  9:30 AM Izell Domino, MD [facility] None  04/15/2023  8:45 AM [facility] LINAC 3 [facility] None  04/16/2023  1:15 PM [facility] [id number] [facility] None  04/19/2023  9:30 AM [facility] [id number] [facility] None  04/20/2023  8:45 AM [facility] LINAC 4 [facility] None  04/21/2023  9:30 AM [facility] LINAC 3 [facility] None  04/21/2023  1:00 PM [facility] SPEECH THERAPIST [facility] [place]  04/21/2023  1:00 PM [facility] R/F 1 [facility] [place]  04/22/2023  9:00 AM [facility] LINAC 4 [facility] None  04/23/2023  9:30 AM [facility] [id number] [facility] None  04/26/2023  9:30 AM [facility] [id number] [facility]  None  04/27/2023 10:00 AM [facility] [id number] [facility] None  04/28/2023  9:30 AM [facility] [id number] [facility] None  04/29/2023  9:30 AM [facility] [id number] [facility] None  04/30/2023  9:30 AM [facility] [id number] [facility] None  05/03/2023  9:30 AM [facility] [id number] [facility] None  05/04/2023  9:30 AM [facility] [id number] [facility] None  05/05/2023  9:30 AM [facility] [id number] [facility] None  05/06/2023  9:30 AM [facility] [id number] [facility] None  05/07/2023  9:30 AM [facility] [id number] [facility] None  05/10/2023  9:30 AM [facility] [id number] [facility] None  05/11/2023  9:30 AM [facility] [id number] [facility] None  05/12/2023  9:30 AM [facility] [id number] [facility] None  05/13/2023  9:30 AM [facility] [id number] [facility] None  05/14/2023  9:30 AM [facility] [id number] [facility] None  05/17/2023  9:30 AM [facility] [id number] [facility] None  05/18/2023  9:30 AM [facility] [id number] [facility] None  05/19/2023  9:30 AM [facility] [id number] [facility] None  05/20/2023  9:30 AM [facility] [id number] [facility] None  05/21/2023  9:30 AM [facility] [id number] [facility] None  05/24/2023  9:30 AM [facility] [id number] [facility] None  05/25/2023  9:30 AM [facility] [id number] [facility] None  05/26/2023  9:30 AM [facility] [id number] [facility] None  05/27/2023  9:30 AM [facility] [id number] [facility] None  05/28/2023  9:30 AM [facility] [id number] [facility] None  05/31/2023  9:30 AM [facility] [id number] [facility] None  06/01/2023  9:30 AM [facility] [id number] [facility] None  08/04/2023  1:40 PM Tobie, Suzzane POUR, MD [facility]     I spent a total of 60 minutes during this encounter with the patient including review of chart and various tests results, discussions about plan of care and coordination of care plan.  This document was completed utilizing speech recognition software. Grammatical errors, random word insertions, pronoun errors, and incomplete  sentences are an occasional consequence of this system due to software limitations, ambient noise, and hardware issues. Any formal questions or concerns about the content, text or information contained within the body of this dictation should be directly addressed to the provider for clarification.

## 2023-04-12 ENCOUNTER — Encounter: Payer: Self-pay | Admitting: Oncology

## 2023-04-12 ENCOUNTER — Encounter (HOSPITAL_COMMUNITY): Payer: Self-pay

## 2023-04-12 NOTE — Assessment & Plan Note (Addendum)
 Supraglottic cancer with one suspicious 7 mm lymph node on PET scan, classified as T1 vs T3, N1, stage III, low volume disease.   Treatment options include radiation alone or combined with chemotherapy. Chemotherapy enhances radiation efficacy. Discussed risks (neuropathy, kidney dysfunction, tinnitus, hearing loss, blood count drops, nausea, fatigue, electrolyte disturbances) and benefits of chemotherapy. Majority of T1,N1 tumors can be treated with radiation alone.  If it is clinical T3 lesion, concurrent chemotherapy and radiation will be offered.    -He is scheduled to begin radiation treatments from 04/14/2023.  - Discuss case in conference next Wednesday, 04/14/2023.  If consensus opinion is to proceed with concurrent chemotherapy during radiation, we will arrange for chemo education, port cath placement prior to proceeding with chemo.  If proceeding with chemotherapy, we will treat him with cisplatin  40 mg/m weekly during the course of radiation.

## 2023-04-12 NOTE — Progress Notes (Signed)
 Karalee Wilkie POUR, MD  Tommie Riggs Approved!  HKM       Previous Messages    ----- Message ----- From: Tommie Riggs Sent: 04/09/2023  12:51 PM EST To: [facility] Procedure Requests Subject: G-Tube request                                PROCEDURE:  GTUBE & PORT (tent scheduled 1/29)

## 2023-04-14 ENCOUNTER — Other Ambulatory Visit: Payer: Self-pay

## 2023-04-14 ENCOUNTER — Ambulatory Visit
Admission: RE | Admit: 2023-04-14 | Discharge: 2023-04-14 | Disposition: A | Discharge status: Home / Self Care | Payer: Medicare Other | Source: Ambulatory Visit | Attending: Radiation Oncology | Admitting: Radiation Oncology

## 2023-04-14 ENCOUNTER — Other Ambulatory Visit: Payer: Self-pay | Admitting: Oncology

## 2023-04-14 ENCOUNTER — Other Ambulatory Visit: Payer: Medicare Other

## 2023-04-14 ENCOUNTER — Ambulatory Visit: Payer: Medicare Other | Admitting: Oncology

## 2023-04-14 DIAGNOSIS — Z510 Encounter for antineoplastic radiation therapy: Secondary | ICD-10-CM | POA: Diagnosis not present

## 2023-04-14 DIAGNOSIS — C321 Malignant neoplasm of supraglottis: Secondary | ICD-10-CM

## 2023-04-14 DIAGNOSIS — R9389 Abnormal findings on diagnostic imaging of other specified body structures: Secondary | ICD-10-CM

## 2023-04-14 LAB — RAD ONC ARIA SESSION SUMMARY
Course Elapsed Days: 0
Plan Fractions Treated to Date: 1
Plan Prescribed Dose Per Fraction: 2 Gy
Plan Total Fractions Prescribed: 35
Plan Total Prescribed Dose: 70 Gy
Reference Point Dosage Given to Date: 2 Gy
Reference Point Session Dosage Given: 2 Gy
Session Number: 1

## 2023-04-14 MED ORDER — DEXAMETHASONE 4 MG PO TABS: ORAL_TABLET | ORAL | 1 refills | Status: DC

## 2023-04-14 MED ORDER — ONDANSETRON HCL 8 MG PO TABS: 8.0000 mg | ORAL_TABLET | Freq: Three times a day (TID) | ORAL | 1 refills | Status: DC | PRN

## 2023-04-14 MED ORDER — PROCHLORPERAZINE MALEATE 10 MG PO TABS: 10.0000 mg | ORAL_TABLET | Freq: Four times a day (QID) | ORAL | 1 refills | Status: DC | PRN

## 2023-04-14 MED ORDER — LIDOCAINE-PRILOCAINE 2.5-2.5 % EX CREA: TOPICAL_CREAM | CUTANEOUS | 3 refills | Status: DC

## 2023-04-14 NOTE — Progress Notes (Signed)
 START ON PATHWAY REGIMEN - Head and Neck     A cycle is every 7 days:     Cisplatin    **Always confirm dose/schedule in your pharmacy ordering system**  Patient Characteristics: Larynx, Preoperative or Nonsurgical Candidate, Stage III-IVB, Candidate for Definitive ChemoRT Alone Disease Classification: Larynx AJCC T Category: T3 AJCC 8 Stage Grouping: III Therapeutic Status: Preoperative or Nonsurgical Candidate AJCC N Category: cN1 AJCC M Category: M0 Intent of Therapy: Curative Intent, Discussed with Patient

## 2023-04-15 ENCOUNTER — Other Ambulatory Visit: Payer: Self-pay

## 2023-04-15 ENCOUNTER — Ambulatory Visit
Admission: RE | Admit: 2023-04-15 | Discharge: 2023-04-15 | Disposition: A | Discharge status: Home / Self Care | Payer: Medicare Other | Source: Ambulatory Visit | Attending: Radiation Oncology | Admitting: Radiation Oncology

## 2023-04-15 DIAGNOSIS — Z510 Encounter for antineoplastic radiation therapy: Secondary | ICD-10-CM | POA: Diagnosis not present

## 2023-04-15 LAB — RAD ONC ARIA SESSION SUMMARY
Course Elapsed Days: 1
Plan Fractions Treated to Date: 2
Plan Prescribed Dose Per Fraction: 2 Gy
Plan Total Fractions Prescribed: 35
Plan Total Prescribed Dose: 70 Gy
Reference Point Dosage Given to Date: 4 Gy
Reference Point Session Dosage Given: 2 Gy
Session Number: 2

## 2023-04-16 ENCOUNTER — Inpatient Hospital Stay: Payer: Medicare Other

## 2023-04-16 ENCOUNTER — Other Ambulatory Visit: Payer: Medicare Other

## 2023-04-16 ENCOUNTER — Encounter: Payer: Self-pay | Admitting: Oncology

## 2023-04-16 ENCOUNTER — Other Ambulatory Visit: Payer: Self-pay

## 2023-04-16 ENCOUNTER — Inpatient Hospital Stay (HOSPITAL_BASED_OUTPATIENT_CLINIC_OR_DEPARTMENT_OTHER): Payer: Medicare Other | Admitting: Oncology

## 2023-04-16 ENCOUNTER — Ambulatory Visit
Admission: RE | Admit: 2023-04-16 | Discharge: 2023-04-16 | Disposition: A | Discharge status: Home / Self Care | Payer: Medicare Other | Source: Ambulatory Visit | Attending: Radiation Oncology

## 2023-04-16 VITALS — BP 124/85

## 2023-04-16 DIAGNOSIS — C321 Malignant neoplasm of supraglottis: Secondary | ICD-10-CM | POA: Diagnosis not present

## 2023-04-16 DIAGNOSIS — Z510 Encounter for antineoplastic radiation therapy: Secondary | ICD-10-CM | POA: Diagnosis not present

## 2023-04-16 DIAGNOSIS — R9389 Abnormal findings on diagnostic imaging of other specified body structures: Secondary | ICD-10-CM

## 2023-04-16 LAB — COMPREHENSIVE METABOLIC PANEL
ALT: 9 U/L (ref 0–44)
AST: 15 U/L (ref 15–41)
Albumin: 4.4 g/dL (ref 3.5–5.0)
Alkaline Phosphatase: 56 U/L (ref 38–126)
Anion gap: 5 (ref 5–15)
BUN: 9 mg/dL (ref 6–20)
CO2: 30 mmol/L (ref 22–32)
Calcium: 10 mg/dL (ref 8.9–10.3)
Chloride: 105 mmol/L (ref 98–111)
Creatinine, Ser: 1.08 mg/dL (ref 0.61–1.24)
GFR, Estimated: >60 mL/min (ref >60)
Glucose, Bld: 100 mg/dL — ABNORMAL HIGH (ref 70–99)
Potassium: 4.3 mmol/L (ref 3.5–5.1)
Sodium: 140 mmol/L (ref 135–145)
Total Bilirubin: 0.7 mg/dL (ref 0.0–1.2)
Total Protein: 7.8 g/dL (ref 6.5–8.1)

## 2023-04-16 LAB — CBC WITH DIFFERENTIAL/PLATELET
Abs Immature Granulocytes: 0.02 10*3/uL (ref 0.00–0.07)
Basophils Absolute: 0 10*3/uL (ref 0.0–0.1)
Basophils Relative: 0 %
Eosinophils Absolute: 0.2 10*3/uL (ref 0.0–0.5)
Eosinophils Relative: 3 %
HCT: 43.8 % (ref 39.0–52.0)
Hemoglobin: 14.5 g/dL (ref 13.0–17.0)
Immature Granulocytes: 0 %
Lymphocytes Relative: 43 %
Lymphs Abs: 3 10*3/uL (ref 0.7–4.0)
MCH: 29.1 pg (ref 26.0–34.0)
MCHC: 33.1 g/dL (ref 30.0–36.0)
MCV: 87.8 fL (ref 80.0–100.0)
Monocytes Absolute: 0.8 10*3/uL (ref 0.1–1.0)
Monocytes Relative: 11 %
Neutro Abs: 2.9 10*3/uL (ref 1.7–7.7)
Neutrophils Relative %: 43 %
Platelets: 229 10*3/uL (ref 150–400)
RBC: 4.99 MIL/uL (ref 4.22–5.81)
RDW: 14.2 % (ref 11.5–15.5)
WBC: 6.9 10*3/uL (ref 4.0–10.5)
nRBC: 0 % (ref 0.0–0.2)

## 2023-04-16 LAB — RAD ONC ARIA SESSION SUMMARY
Course Elapsed Days: 2
Plan Fractions Treated to Date: 3
Plan Prescribed Dose Per Fraction: 2 Gy
Plan Total Fractions Prescribed: 35
Plan Total Prescribed Dose: 70 Gy
Reference Point Dosage Given to Date: 6 Gy
Reference Point Session Dosage Given: 2 Gy
Session Number: 3

## 2023-04-16 LAB — MAGNESIUM: Magnesium: 2.1 mg/dL (ref 1.7–2.4)

## 2023-04-16 LAB — TSH: TSH: 1.058 u[IU]/mL (ref 0.350–4.500)

## 2023-04-16 MED ORDER — SONAFINE EX EMUL
1.0000 | Freq: Two times a day (BID) | CUTANEOUS | Status: DC
  Administered 2023-04-16: 1 via TOPICAL

## 2023-04-16 NOTE — Progress Notes (Signed)
[facility]  ONCOLOGY CLINIC PROGRESS NOTE   Patient Care Team: [name], MD as PCP - General (Internal Medicine) West Bali, MD (Inactive) as Consulting Physician (Gastroenterology) Lonie Peak, MD as Attending Physician (Radiation Oncology)  PATIENT NAME: [name]   MR#: [id number] DOB: [birth year]  Date of visit: 04/16/2023   ASSESSMENT & PLAN:   ADEN GARY is a 53 y.o. 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   Malignant neoplasm of supraglottis (HCC) Supraglottic cancer with one suspicious 7 mm lymph node on PET scan, classified as T3, N1, M0, stage III, low volume disease.   Discussed his case in our tumor conference on 04/14/2023.  Based on imaging studies, it is classified as cT3 lesion.  cN1.  Hence plan is to proceed with concurrent chemoradiation.  We have discussed about role of cisplatin being a radiosensitizer in the treatment of head and neck cancer.  We have discussed about the curative intent of chemoradiation for this patient.     We have discussed about mechanism of action of cisplatin, adverse effects of cisplatin including but not limited to fatigue, nausea, vomiting, increased risk of infections, mucositis, ototoxicity, nephrotoxicity, peripheral neuropathy.  Patient understands that some of the side effects can be permanent and even potentially fatal.  We have discussed about role of Mediport and G-tube for chemotherapy administration and nutrition respectively since most of these patients have severe mucositis during the treatment.  At this time we do not know if weekly cisplatin is inferior to every 21 days cisplatin since there is no head-to-head comparison trial.  I did mention to the patient however weekly cisplatin is well-tolerated with less adverse effects  and most of the patients tend to complete treatment as planned.  Patient is willing to proceed with weekly cisplatin.  -He began radiation treatments from 04/14/2023.  -Labs today reveal no dose-limiting toxicities.  Will proceed with cisplatin weekly during the course of radiation.  He is scheduled to start treatments from 04/19/2023.  Cisplatin will be given at a dose of 40 mg/m weekly.   -His Port-A-Cath and feeding tube placement are not scheduled until 04/28/2023.  If an earlier appointment is available, we will expedite this.  We will continue chemotherapy through peripheral IV up until that point.  -He has prescriptions available for nausea medicines.  Emphasized adequate hydration.  Patient verbalized understanding.  RTC in 1 week for labs, follow-up.    I reviewed lab results and outside records for this visit and discussed relevant results with the patient. Diagnosis, plan of care and treatment options were also discussed in detail with the patient. Opportunity provided to ask questions and answers provided to his apparent satisfaction. Provided instructions to call our clinic with any problems, questions or concerns prior to return visit. I recommended to continue follow-up with PCP and sub-specialists. He verbalized understanding and agreed with the plan.   NCCN guidelines have been consulted in the planning of this patient's care.  I spent a total of 30 minutes during this encounter with the patient including review of chart and various tests results, discussions about plan of care and coordination of care plan.   Meryl Crutch, MD  04/16/2023 3:27 PM  [facility] - A DEPT OF 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   Current Treatment: Concurrent chemoradiation with weekly cisplatin.  Radiation started from 04/14/2023.  Cisplatin started from 04/19/2023.  INTERVAL HISTORY:    Discussed the use of AI scribe software for clinical note transcription with the patient, who gave verbal consent to proceed.   [name] is here today for repeat clinical assessment.   He reports persistent ear pain, voice changes for about three months, and difficulty swallowing. The patient initially thought the symptoms were due to sinus issues. The patient has been experiencing weight loss due to decreased food intake but has been compensating with increased water and Ensure intake. The patient is currently trying to quit smoking using nicotine patches.   He expresses understanding and acceptance of the treatment plan, including potential side effects such as hair thinning, ringing in the ears, and hearing loss. He is particularly concerned about staying hydrated to protect his kidneys and is aware that swallowing may become difficult 4-5 weeks into treatment due to radiation soreness.  I have reviewed the past medical history, past surgical history, social history and family history with the patient and they are unchanged from previous note.  HISTORY OF PRESENT ILLNESS:   ONCOLOGY HISTORY:   He initially presented to the [facility] on 10/28/22 with c/o left ear pain, popping with chewing, and pain radiating down the side of his neck and throat x 1 month. He was treated for a left ear infection with a course of prednisone.   His symptoms including left ear and throat pain however persisted and progressed and he was subsequently referred to Dr. Suszanne Conners on 03/10/23 at [facility] for further evaluation. During which time, the patient detailed that his pain first began in the ear and eventually progressed to involve the left side of his throat.  A laryngoscopy was subsequently performed at that time which  revealed an ulcerative lesion on the posterior aspect of the epiglottis, superior to the vocal cords. The arytenoid mucosa was also mildly edematous and the true vocal folds were pale yellow, mobile, and without mass or lesion.       He accordingly underwent a direct laryngoscopy with biopsies of the left supraglottic tumor/mass on 03/16/23. Pathology showed findings consistent with invasive and in situ moderately differentiated squamous cell carcinoma. Per note by Dr Suszanne Conners, "Examination of the vallecula, piriform sinuses, and the pharyngeal mucosa were all normal. A fungating mass was noted on the posterior aspect of the left epiglottis. The mass did not appear to involve the vocal cords."   Soft tissue neck CT and PET scan on 03/23/23 were done - demonstrating a left supraglottic lesion which may involve the glottis as well as a 7 mm left level 3 lymph node that is suspicious for metastatic involvement.  No distant metastatic disease.  cT3,cN1,cM0, Stage III disease.   Plan made to proceed with concurrent chemoradiation with weekly cisplatin.  Oncology History  Malignant neoplasm of supraglottis (HCC)  03/22/2023 Initial Diagnosis   Malignant neoplasm of supraglottis (HCC)   03/26/2023 Cancer Staging   Staging form: Larynx - Supraglottis, AJCC 8th Edition - Clinical stage from 03/26/2023: Stage III (cT3, cN1, cM0) - Signed by Lonie Peak, MD on 04/14/2023 Stage prefix: Initial diagnosis   04/19/2023 -  Chemotherapy   Patient  is on Treatment Plan : HEAD/NECK Cisplatin (40) q7d         REVIEW OF SYSTEMS:   Review of Systems - Oncology  All other pertinent systems were reviewed with the patient and are negative.  ALLERGIES: He has no known allergies.  MEDICATIONS:  Current Outpatient Medications  Medication Sig Dispense Refill   amlodipine-olmesartan (AZOR) 10-20 MG tablet Take 1 tablet by mouth daily. 30 tablet 1   dexamethasone (DECADRON) 4 MG tablet Take 2 tablets (8 mg) by  mouth daily x 3 days starting the day after cisplatin chemotherapy. Take with food. 30 tablet 1   famotidine (PEPCID) 40 MG tablet Take 1 tablet (40 mg total) by mouth daily. 30 tablet 3   FLUoxetine (PROZAC) 20 MG capsule Take 1 capsule (20 mg total) by mouth daily. 30 capsule 3   fluticasone (FLONASE) 50 MCG/ACT nasal spray Place 1 spray into both nostrils 2 (two) times daily. 16 g 2   hydrOXYzine (ATARAX) 25 MG tablet Take 1 tablet (25 mg total) by mouth 3 (three) times daily as needed for anxiety. 90 tablet 1   lidocaine (XYLOCAINE) 2 % solution Patient: Mix 1part 2% viscous lidocaine, 1part H20. Swallow 10mL of diluted mixture, before meals and at bedtime, up to QID 200 mL 3   metoprolol succinate (TOPROL-XL) 25 MG 24 hr tablet Take 1 tablet (25 mg total) by mouth daily. 30 tablet 1   nicotine (NICODERM CQ - DOSED IN MG/24 HOURS) 14 mg/24hr patch Place 1 patch (14 mg total) onto the skin daily. Apply 21 mg patch daily x 6 wk, then 14mg  patch daily x 2 wk, then 7 mg patch daily x 2 wk 14 patch 0   nicotine (NICODERM CQ - DOSED IN MG/24 HOURS) 21 mg/24hr patch Place 1 patch (21 mg total) onto the skin daily. Apply 21 mg patch daily x 6 wk, then 14mg  patch daily x 2 wk, then 7 mg patch daily x 2 wk 14 patch 2   nicotine (NICODERM CQ - DOSED IN MG/24 HR) 7 mg/24hr patch Place 1 patch (7 mg total) onto the skin daily. Apply 21 mg patch daily x 6 wk, then 14mg  patch daily x 2 wk, then 7 mg patch daily x 2 wk 14 patch 0   ondansetron (ZOFRAN) 8 MG tablet Take 1 tablet (8 mg total) by mouth every 8 (eight) hours as needed for nausea or vomiting. Start on the third day after cisplatin. 30 tablet 1   prochlorperazine (COMPAZINE) 10 MG tablet Take 1 tablet (10 mg total) by mouth every 6 (six) hours as needed (Nausea or vomiting). 30 tablet 1   lidocaine-prilocaine (EMLA) cream Apply to affected area once (Patient not taking: Reported on 04/16/2023) 30 g 3   [START ON 05/02/2023] QUEtiapine (SEROQUEL) 200  MG tablet Take 1 tablet (200 mg total) by mouth at bedtime. (Patient not taking: Reported on 04/16/2023) 30 tablet 2   QUEtiapine (SEROQUEL) 50 MG tablet Take one pill nightly for 1 week. Then increase to 2 pills nightly for 1 week. Then increase to 3 pills nightly for one week. Then increase to 4 pills nightly for one week. (Patient not taking: Reported on 04/16/2023) 90 tablet 0   No current facility-administered medications for this visit.   Facility-Administered Medications Ordered in Other Visits  Medication Dose Route Frequency Provider Last Rate Last Admin   Sonafine emulsion 1 Application  1 Application Topical BID Lonie Peak, MD   1 Application at 04/16/23 [phone]  VITALS:   Blood pressure 124/85.  Wt Readings from Last 3 Encounters:  04/09/23 151 lb 12.8 oz (68.9 kg)  04/05/23 151 lb (68.5 kg)  03/29/23 155 lb 6.4 oz (70.5 kg)    There is no height or weight on file to calculate BMI.  Performance status (ECOG): 1 - Symptomatic but completely ambulatory  PHYSICAL EXAM:   Physical Exam Constitutional:      General: He is not in acute distress.    Appearance: Normal appearance.  HENT:     Head: Normocephalic and atraumatic.  Eyes:     General: No scleral icterus.    Conjunctiva/sclera: Conjunctivae normal.  Cardiovascular:     Rate and Rhythm: Normal rate and regular rhythm.     Heart sounds: Normal heart sounds.  Pulmonary:     Effort: Pulmonary effort is normal.     Breath sounds: Normal breath sounds.  Abdominal:     General: There is no distension.  Musculoskeletal:     Right lower leg: No edema.     Left lower leg: No edema.  Neurological:     General: No focal deficit present.     Mental Status: He is alert and oriented to person, place, and time.  Psychiatric:        Mood and Affect: Mood normal.        Behavior: Behavior normal.        Thought Content: Thought content normal.       LABORATORY DATA:   I have reviewed the data as  listed.  Results for orders placed or performed in visit on 04/16/23  Magnesium  Result Value Ref Range   Magnesium 2.1 1.7 - 2.4 mg/dL  Comprehensive metabolic panel  Result Value Ref Range   Sodium 140 135 - 145 mmol/L   Potassium 4.3 3.5 - 5.1 mmol/L   Chloride 105 98 - 111 mmol/L   CO2 30 22 - 32 mmol/L   Glucose, Bld 100 (H) 70 - 99 mg/dL   BUN 9 6 - 20 mg/dL   Creatinine, Ser 0.86 0.61 - 1.24 mg/dL   Calcium 57.8 8.9 - 46.9 mg/dL   Total Protein 7.8 6.5 - 8.1 g/dL   Albumin 4.4 3.5 - 5.0 g/dL   AST 15 15 - 41 U/L   ALT 9 0 - 44 U/L   Alkaline Phosphatase 56 38 - 126 U/L   Total Bilirubin 0.7 0.0 - 1.2 mg/dL   GFR, Estimated >62 >95 mL/min   Anion gap 5 5 - 15  CBC with Differential/Platelet  Result Value Ref Range   WBC 6.9 4.0 - 10.5 K/uL   RBC 4.99 4.22 - 5.81 MIL/uL   Hemoglobin 14.5 13.0 - 17.0 g/dL   HCT 28.4 13.2 - 44.0 %   MCV 87.8 80.0 - 100.0 fL   MCH 29.1 26.0 - 34.0 pg   MCHC 33.1 30.0 - 36.0 g/dL   RDW 10.2 72.5 - 36.6 %   Platelets 229 150 - 400 K/uL   nRBC 0.0 0.0 - 0.2 %   Neutrophils Relative % 43 %   Neutro Abs 2.9 1.7 - 7.7 K/uL   Lymphocytes Relative 43 %   Lymphs Abs 3.0 0.7 - 4.0 K/uL   Monocytes Relative 11 %   Monocytes Absolute 0.8 0.1 - 1.0 K/uL   Eosinophils Relative 3 %   Eosinophils Absolute 0.2 0.0 - 0.5 K/uL   Basophils Relative 0 %   Basophils Absolute 0.0 0.0 - 0.1 K/uL  Immature Granulocytes 0 %   Abs Immature Granulocytes 0.02 0.00 - 0.07 K/uL  Results for orders placed or performed in visit on 04/16/23  Rad Onc Aria Session Summary  Result Value Ref Range   Course ID [id number]    Course Start Date 04/05/2023    Session Number 3    Course First Treatment Date 04/14/2023  9:27 AM    Course Last Treatment Date 04/16/2023  1:28 PM    Course Elapsed Days 2    Reference Point ID HN    Reference Point Dosage Given to Date 6 Gy   Reference Point Session Dosage Given 2 Gy   Plan ID HN    Plan Fractions Treated to Date 3    Plan  Total Fractions Prescribed 35    Plan Prescribed Dose Per Fraction 2 Gy   Plan Total Prescribed Dose 70.000000 Gy   Plan Primary Reference Point HN      RADIOGRAPHIC STUDIES:  I have personally reviewed the radiological images as listed and agree with the findings in the report.  CT Soft Tissue Neck W Contrast Result Date: 03/26/2023 CLINICAL DATA:  Provided history: Laryngeal cancer. Head/neck cancer, staging. EXAM: CT NECK WITH CONTRAST TECHNIQUE: Multidetector CT imaging of the neck was performed using the standard protocol following the bolus administration of intravenous contrast. RADIATION DOSE REDUCTION: This exam was performed according to the departmental dose-optimization program which includes automated exposure control, adjustment of the mA and/or kV according to patient size and/or use of iterative reconstruction technique. CONTRAST:  75mL OMNIPAQUE IOHEXOL 300 MG/ML  SOLN COMPARISON:  PET CT 03/23/2023. FINDINGS: Pharynx and larynx: Enhancing mucosal/submucosal lesion along the laryngeal surface of the epiglottis on the left, along the left aryepiglottic fold, more inferiorly within the supraglottic larynx at midline and to the left and extending inferiorly to the level of the false vocal cord. The lesion measures up to 8 mm in thickness and spans 3 cm in craniocaudal dimension (series 2, image 66) (series 604, image 38). The tumor partially effaces the left pyriform sinus. Additionally, there is apparent infiltration of the paraglottic space on the left (series 2, image 61). No definite involvement of the true vocal cords. Salivary glands: No inflammation, mass, or stone. Thyroid: Unremarkable. Lymph nodes: No pathologically enlarged cervical chain lymph nodes identified. However, please refer to the same-day PET-CT for a description a hypermetabolic left level III lymph node. Vascular: The major vascular structures of the neck are patent. Minimal atherosclerotic plaque about the carotid  bifurcations bilaterally. Limited intracranial: No evidence of an acute intracranial abnormality within the field of view. Visualized orbits: Incompletely imaged. No orbital mass or acute orbital finding. Mastoids and visualized paranasal sinuses: The frontal sinuses are excluded from the field of view. Portions of the ethmoid sinuses are also excluded from the field of view superiorly. 2.7 cm mucous retention cyst within the left maxillary sinus inferiorly. Polypoid mucosal thickening more superiorly within the left maxillary sinus, and within the right maxillary sinus. No significant mastoid effusion. Skeleton: Cervical spondylosis. No acute fracture or aggressive osseous lesion. Upper chest: No consolidation or nodule within the imaged lung apices. IMPRESSION: 1. Enhancing mucosal/submucosal lesion along the laryngeal surface of the epiglottis on the left, along the left aryepiglottic fold, more inferiorly within the supraglottic larynx at midline and to the left and extending inferiorly to the level of the false vocal cord. This is consistent with a mucosal/submucosal neoplasm. The lesion measures up to 8 mm in thickness and spans 3  cm in craniocaudal dimension. The tumor partially effaces the left pyriform sinus. Additionally, there is apparent infiltration of the paraglottic space on the left. No definite involvement of the true vocal cords. However, correlate with findings at pharyngolaryngoscopy. 2. Please refer to the same-day PET-CT for a description a hypermetabolic left level III lymph node. 3. Paranasal sinus disease as described. Electronically Signed   By: Jackey Loge D.O.   On: 03/26/2023 10:02   NM PET Image Initial (PI) Skull Base To Thigh (F-18 FDG) Result Date: 03/26/2023 CLINICAL DATA:  Initial treatment strategy for laryngeal cancer. EXAM: NUCLEAR MEDICINE PET SKULL BASE TO THIGH TECHNIQUE: 7.4 mCi F-18 FDG was injected intravenously. Full-ring PET imaging was performed from the skull base  to thigh after the radiotracer. CT data was obtained and used for attenuation correction and anatomic localization. Fasting blood glucose: 127 mg/dl COMPARISON:  CT neck 16/12/9602 FINDINGS: Mediastinal blood pool activity: SUV max 2.2 Liver activity: SUV max NA NECK: Left glottic and immediately supraglottic lesion has a maximum SUV of 11.4, compatible with malignancy. 0.7 cm left level III lymph node on image 65 series 602 has a maximum SUV of 4.0. Incidental CT findings: Chronic bilateral maxillary sinusitis. CHEST: Likely physiologic activity in the esophagus most notable in the distal esophagus with maximum SUV is 5.3. Incidental CT findings: Coronary, aortic arch, and branch vessel atherosclerotic vascular disease. Mild cardiomegaly. ABDOMEN/PELVIS: No significant abnormal hypermetabolic activity in this region. Incidental CT findings: Atherosclerosis is present, including aortoiliac atherosclerotic disease. SKELETON: No significant abnormal hypermetabolic activity in this region. Incidental CT findings: None. IMPRESSION: 1. Left glottic and immediately supraglottic lesion has a maximum SUV of 11.4, compatible with malignancy. 2. 0.7 cm left level III lymph node has a maximum SUV of 4.0, early metastatic involvement not excluded. 3. No findings of distant metastatic disease. 4. Chronic bilateral maxillary sinusitis. 5. Coronary, aortic arch, and branch vessel atherosclerotic vascular disease. Mild cardiomegaly. Aortic Atherosclerosis (ICD10-I70.0). Electronically Signed   By: Gaylyn Rong M.D.   On: 03/26/2023 09:11    CODE STATUS:  Code Status History     Date Active Date Inactive Code Status Order ID Comments User Context   06/23/2017 1540 06/28/2017 2102 Full Code [id number]  Laveda Abbe, NP Inpatient   06/23/2017 0327 06/23/2017 1502 Full Code [id number]  Gregary Cromer ED   01/13/2017 0858 01/13/2017 1329 Full Code [id number]  Lucretia Roers, MD Inpatient   11/05/2016 0018  11/10/2016 1937 Full Code [id number]  Kerry Hough, PA-C Inpatient       Orders Placed This Encounter  Procedures   CBC with Differential (Cancer Center Only)    Standing Status:   Future    Expected Date:   05/03/2023    Expiration Date:   05/02/2024   Basic Metabolic Panel - Cancer Center Only    Standing Status:   Future    Expected Date:   05/03/2023    Expiration Date:   05/02/2024   Magnesium    Standing Status:   Future    Expected Date:   05/03/2023    Expiration Date:   05/02/2024   CBC with Differential (Cancer Center Only)    Standing Status:   Future    Expected Date:   05/10/2023    Expiration Date:   05/09/2024   Basic Metabolic Panel - Cancer Center Only    Standing Status:   Future    Expected Date:   05/10/2023    Expiration Date:  05/09/2024   Magnesium    Standing Status:   Future    Expected Date:   05/10/2023    Expiration Date:   05/09/2024   CBC with Differential (Cancer Center Only)    Standing Status:   Future    Expected Date:   05/17/2023    Expiration Date:   05/16/2024   Basic Metabolic Panel - Cancer Center Only    Standing Status:   Future    Expected Date:   05/17/2023    Expiration Date:   05/16/2024   Magnesium    Standing Status:   Future    Expected Date:   05/17/2023    Expiration Date:   05/16/2024   CBC with Differential (Cancer Center Only)    Standing Status:   Future    Expected Date:   05/24/2023    Expiration Date:   05/23/2024   Basic Metabolic Panel - Cancer Center Only    Standing Status:   Future    Expected Date:   05/24/2023    Expiration Date:   05/23/2024   Magnesium    Standing Status:   Future    Expected Date:   05/24/2023    Expiration Date:   05/23/2024   CBC with Differential (Cancer Center Only)    Standing Status:   Future    Expected Date:   05/31/2023    Expiration Date:   05/30/2024   Basic Metabolic Panel - Cancer Center Only    Standing Status:   Future    Expected Date:   05/31/2023    Expiration Date:   05/30/2024    Magnesium    Standing Status:   Future    Expected Date:   05/31/2023    Expiration Date:   05/30/2024     Future Appointments  Date Time Provider Department Center  04/16/2023  3:40 PM Liahm Grivas, Archie Patten, MD [facility] None  04/19/2023  8:00 AM [facility] INFUSION [facility] None  04/19/2023  9:30 AM [facility] [id number] [facility] None  04/20/2023  8:45 AM [facility] LINAC 4 [facility] None  04/21/2023  9:30 AM [facility] LINAC 3 [facility] None  04/21/2023  1:00 PM [facility] SPEECH THERAPIST [facility] [place]  04/21/2023  1:00 PM [facility] R/F 1 [facility] [place]  04/22/2023  9:00 AM [facility] LINAC 4 [facility] None  04/23/2023  9:30 AM [facility] [id number] [facility] None  04/23/2023  2:00 PM [facility] LAB [facility] None  04/23/2023  2:40 PM Gaberial Cada, MD [facility] None  04/26/2023  9:30 AM [facility] [id number] [facility] None  04/26/2023 10:00 AM [facility] INFUSION [facility] None  04/27/2023 10:00 AM [facility] [id number] [facility] None  04/28/2023  7:30 AM [facility] ROOM [facility] None  04/28/2023  9:30 AM WL-IR 1 [facility] [place]  04/28/2023 10:00 AM WL-IR 1 [facility] [place]  04/28/2023  2:45 PM [facility] LINAC 3 [facility] None  04/29/2023  9:30 AM [facility] [id number] [facility] None  04/30/2023  9:30 AM [facility] [id number] [facility] None  05/03/2023  9:30 AM [facility] [id number] [facility] None  05/04/2023  9:30 AM [facility] [id number] [facility] None  05/05/2023  9:30 AM [facility] [id number] [facility] None  05/06/2023  9:30 AM [facility] [id number] [facility] None  05/07/2023  9:30 AM [facility] [id number] [facility] None  05/10/2023  9:30 AM [facility] [id number] [facility] None  05/11/2023  9:30 AM [facility] [id number] [facility] None  05/12/2023  9:30 AM [facility] [id number] [facility] None  05/13/2023  9:30 AM [facility] [id number] [facility] None  05/14/2023  9:30 AM [facility] [id number] [facility] None  05/17/2023  9:30  AM [facility] [id number]  [facility] None  05/18/2023  9:30 AM [facility] [id number] [facility] None  05/19/2023  9:30 AM [facility] [id number] [facility] None  05/20/2023  9:30 AM [facility] [id number] [facility] None  05/21/2023  9:30 AM [facility] [id number] [facility] None  05/24/2023  9:30 AM [facility] [id number] [facility] None  05/25/2023  9:30 AM [facility] [id number] [facility] None  05/26/2023  9:30 AM [facility] [id number] [facility] None  05/27/2023  9:30 AM [facility] [id number] [facility] None  05/28/2023  9:30 AM [facility] [id number] [facility] None  05/31/2023  9:30 AM [facility] [id number] [facility] None  06/01/2023  9:30 AM [facility] [id number] [facility] None  08/04/2023  1:40 PM Allena Katz, Earlie Lou, MD [facility]      This document was completed utilizing speech recognition software. Grammatical errors, random word insertions, pronoun errors, and incomplete sentences are an occasional consequence of this system due to software limitations, ambient noise, and hardware issues. Any formal questions or concerns about the content, text or information contained within the body of this dictation should be directly addressed to the provider for clarification.

## 2023-04-16 NOTE — Assessment & Plan Note (Addendum)
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   Discussed his case in our tumor conference on 04/14/2023.  Based on imaging studies, it is classified as cT3 lesion.  cN1.  Hence plan is to proceed with concurrent chemoradiation.  We have discussed about role of cisplatin being a radiosensitizer in the treatment of head and neck cancer.  We have discussed about the curative intent of chemoradiation for this patient.     We have discussed about mechanism of action of cisplatin, adverse effects of cisplatin including but not limited to fatigue, nausea, vomiting, increased risk of infections, mucositis, ototoxicity, nephrotoxicity, peripheral neuropathy.  Patient understands that some of the side effects can be permanent and even potentially fatal.  We have discussed about role of Mediport and G-tube for chemotherapy administration and nutrition respectively since most of these patients have severe mucositis during the treatment.  At this time we do not know if weekly cisplatin is inferior to every 21 days cisplatin since there is no head-to-head comparison trial.  I did mention to the patient however weekly cisplatin is well-tolerated with less adverse effects and most of the patients tend to complete treatment as planned.  Patient is willing to proceed with weekly cisplatin.  -He began radiation treatments from 04/14/2023.  -Labs today reveal no dose-limiting toxicities.  Will proceed with cisplatin weekly during the course of radiation.  He is scheduled to start treatments from 04/19/2023.  Cisplatin will be given at a dose of 40 mg/m weekly.   -His Port-A-Cath and feeding tube placement are not scheduled until 04/28/2023.  If an earlier appointment is available, we will expedite this.  We will continue chemotherapy through peripheral IV up until that point.  -He has prescriptions available for nausea medicines.  Emphasized  adequate hydration.  Patient verbalized understanding.  RTC in 1 week for labs, follow-up.

## 2023-04-17 ENCOUNTER — Telehealth: Payer: Self-pay

## 2023-04-17 NOTE — Telephone Encounter (Signed)
Scheduled appointment per referral. Patient is aware of the made appointment.

## 2023-04-19 ENCOUNTER — Other Ambulatory Visit: Payer: Self-pay

## 2023-04-19 ENCOUNTER — Inpatient Hospital Stay: Payer: Medicare Other

## 2023-04-19 ENCOUNTER — Ambulatory Visit
Admission: RE | Admit: 2023-04-19 | Discharge: 2023-04-19 | Disposition: A | Discharge status: Home / Self Care | Payer: Medicare Other | Source: Ambulatory Visit | Attending: Radiation Oncology

## 2023-04-19 ENCOUNTER — Telehealth: Payer: Self-pay | Admitting: Oncology

## 2023-04-19 ENCOUNTER — Telehealth: Payer: Self-pay | Admitting: Radiation Oncology

## 2023-04-19 VITALS — BP 131/76 | HR 62 | Temp 98.3°F | Resp 16 | Wt 155.5 lb

## 2023-04-19 DIAGNOSIS — Z510 Encounter for antineoplastic radiation therapy: Secondary | ICD-10-CM | POA: Diagnosis not present

## 2023-04-19 DIAGNOSIS — C321 Malignant neoplasm of supraglottis: Secondary | ICD-10-CM

## 2023-04-19 LAB — RAD ONC ARIA SESSION SUMMARY
Course Elapsed Days: 5
Plan Fractions Treated to Date: 4
Plan Prescribed Dose Per Fraction: 2 Gy
Plan Total Fractions Prescribed: 35
Plan Total Prescribed Dose: 70 Gy
Reference Point Dosage Given to Date: 8 Gy
Reference Point Session Dosage Given: 2 Gy
Session Number: 4

## 2023-04-19 MED ORDER — DEXAMETHASONE SODIUM PHOSPHATE 10 MG/ML IJ SOLN
10.0000 mg | Freq: Once | INTRAMUSCULAR | Status: AC
  Administered 2023-04-19: 10 mg via INTRAVENOUS
  Filled 2023-04-19: qty 1

## 2023-04-19 MED ORDER — PALONOSETRON HCL INJECTION 0.25 MG/5ML
0.2500 mg | Freq: Once | INTRAVENOUS | Status: AC
  Administered 2023-04-19: 0.25 mg via INTRAVENOUS
  Filled 2023-04-19: qty 5

## 2023-04-19 MED ORDER — SODIUM CHLORIDE 0.9 % IV SOLN: INTRAVENOUS | Status: DC

## 2023-04-19 MED ORDER — SODIUM CHLORIDE 0.9 % IV SOLN
150.0000 mg | Freq: Once | INTRAVENOUS | Status: AC
  Administered 2023-04-19: 150 mg via INTRAVENOUS
  Filled 2023-04-19: qty 150

## 2023-04-19 MED ORDER — SODIUM CHLORIDE 0.9 % IV SOLN
40.0000 mg/m2 | Freq: Once | INTRAVENOUS | Status: AC
  Administered 2023-04-19: 70 mg via INTRAVENOUS
  Filled 2023-04-19: qty 70

## 2023-04-19 MED ORDER — MAGNESIUM SULFATE 2 GM/50ML IV SOLN
2.0000 g | Freq: Once | INTRAVENOUS | Status: AC
  Administered 2023-04-19: 2 g via INTRAVENOUS
  Filled 2023-04-19: qty 50

## 2023-04-19 MED ORDER — POTASSIUM CHLORIDE IN NACL 20-0.9 MEQ/L-% IV SOLN
Freq: Once | INTRAVENOUS | Status: AC
  Filled 2023-04-19: qty 1000

## 2023-04-19 NOTE — Telephone Encounter (Signed)
 Marland Kitchen

## 2023-04-19 NOTE — Patient Instructions (Signed)
 [facility] - A DEPT OF [facility]  Discharge Instructions: Thank you for choosing [facility] to provide your oncology and hematology care.   If you have a lab appointment with the Cancer Center, please go directly to the Cancer Center and check in at the registration area.   Wear comfortable clothing and clothing appropriate for easy access to any Portacath or PICC line.   We strive to give you quality time with your provider. You may need to reschedule your appointment if you arrive late (15 or more minutes).  Arriving late affects you and other patients whose appointments are after yours.  Also, if you miss three or more appointments without notifying the office, you may be dismissed from the clinic at the provider's discretion.      For prescription refill requests, have your pharmacy contact our office and allow 72 hours for refills to be completed.    Today you received the following chemotherapy and/or immunotherapy agents: Cisplatin.      To help prevent nausea and vomiting after your treatment, we encourage you to take your nausea medication as directed.  BELOW ARE SYMPTOMS THAT SHOULD BE REPORTED IMMEDIATELY: *FEVER GREATER THAN 100.4 F (38 C) OR HIGHER *CHILLS OR SWEATING *NAUSEA AND VOMITING THAT IS NOT CONTROLLED WITH YOUR NAUSEA MEDICATION *UNUSUAL SHORTNESS OF BREATH *UNUSUAL BRUISING OR BLEEDING *URINARY PROBLEMS (pain or burning when urinating, or frequent urination) *BOWEL PROBLEMS (unusual diarrhea, constipation, pain near the anus) TENDERNESS IN MOUTH AND THROAT WITH OR WITHOUT PRESENCE OF ULCERS (sore throat, sores in mouth, or a toothache) UNUSUAL RASH, SWELLING OR PAIN  UNUSUAL VAGINAL DISCHARGE OR ITCHING   Items with * indicate a potential emergency and should be followed up as soon as possible or go to the Emergency Department if any problems should occur.  Please show the CHEMOTHERAPY ALERT CARD or IMMUNOTHERAPY  ALERT CARD at check-in to the Emergency Department and triage nurse.  Should you have questions after your visit or need to cancel or reschedule your appointment, please contact [facility] - [facility] DE[facility]  Dept: [phone]  and follow the prompts.  Office hours are 8:00 a.m. to 4:30 p.m. Monday - Friday. Please note that voicemails left after 4:00 p.m. may not be returned until the following business day.  We are closed weekends and major holidays. You have access to a nurse at all times for urgent questions. Please call the main number to the clinic Dept: [phone] and follow the prompts.   For any non-urgent questions, you may also contact your provider using MyChart. We now offer e-Visits for anyone 57 and older to request care online for non-urgent symptoms. For details visit [email or web address].   Also download the MyChart app! Go to the app store, search "MyChart", open the app, select [facility], and log in with your MyChart username and password.  Cisplatin Injection What is this medication? CISPLATIN (SIS pla tin) treats some types of cancer. It works by slowing down the growth of cancer cells. This medicine may be used for other purposes; ask your health care provider or pharmacist if you have questions. COMMON BRAND NAME(S): Platinol, Platinol -AQ What should I tell my care team before I take this medication? They need to know if you have any of these conditions: Eye disease, vision problems Hearing problems Kidney disease Low blood counts, such as low white cells, platelets, or red  blood cells Tingling of the fingers or toes, or other nerve disorder An unusual or allergic reaction to cisplatin, carboplatin, oxaliplatin, other medications, foods, dyes, or preservatives If you or your partner are pregnant or trying to get pregnant Breast-feeding How should I use this medication? This medication is injected into a vein. It is given  by your care team in a hospital or clinic setting. Talk to your care team about the use of this medication in children. Special care may be needed. Overdosage: If you think you have taken too much of this medicine contact a poison control center or emergency room at once. NOTE: This medicine is only for you. Do not share this medicine with others. What if I miss a dose? Keep appointments for follow-up doses. It is important not to miss your dose. Call your care team if you are unable to keep an appointment. What may interact with this medication? Do not take this medication with any of the following: Live virus vaccines This medication may also interact with the following: Certain antibiotics, such as amikacin, gentamicin, neomycin, polymyxin B, streptomycin, tobramycin, vancomycin Foscarnet This list may not describe all possible interactions. Give your health care provider a list of all the medicines, herbs, non-prescription drugs, or dietary supplements you use. Also tell them if you smoke, drink alcohol, or use illegal drugs. Some items may interact with your medicine. What should I watch for while using this medication? Your condition will be monitored carefully while you are receiving this medication. You may need blood work done while taking this medication. This medication may make you feel generally unwell. This is not uncommon, as chemotherapy can affect healthy cells as well as cancer cells. Report any side effects. Continue your course of treatment even though you feel ill unless your care team tells you to stop. This medication may increase your risk of getting an infection. Call your care team for advice if you get a fever, chills, sore throat, or other symptoms of a cold or flu. Do not treat yourself. Try to avoid being around people who are sick. Avoid taking medications that contain aspirin, acetaminophen, ibuprofen, naproxen, or ketoprofen unless instructed by your care team. These  medications may hide a fever. This medication may increase your risk to bruise or bleed. Call your care team if you notice any unusual bleeding. Be careful brushing or flossing your teeth or using a toothpick because you may get an infection or bleed more easily. If you have any dental work done, tell your dentist you are receiving this medication. Drink fluids as directed while you are taking this medication. This will help protect your kidneys. Call your care team if you get diarrhea. Do not treat yourself. Talk to your care team if you or your partner wish to become pregnant or think you might be pregnant. This medication can cause serious birth defects if taken during pregnancy and for 14 months after the last dose. A negative pregnancy test is required before starting this medication. A reliable form of contraception is recommended while taking this medication and for 14 months after the last dose. Talk to your care team about effective forms of contraception. Do not father a child while taking this medication and for 11 months after the last dose. Use a condom during sex during this time period. Do not breast-feed while taking this medication. This medication may cause infertility. Talk to your care team if you are concerned about your fertility. What side effects may I  notice from receiving this medication? Side effects that you should report to your care team as soon as possible: Allergic reactions--skin rash, itching, hives, swelling of the face, lips, tongue, or throat Eye pain, change in vision, vision loss Hearing loss, ringing in ears Infection--fever, chills, cough, sore throat, wounds that don't heal, pain or trouble when passing urine, general feeling of discomfort or being unwell Kidney injury--decrease in the amount of urine, swelling of the ankles, hands, or feet Low red blood cell level--unusual weakness or fatigue, dizziness, headache, trouble breathing Painful swelling, warmth,  or redness of the skin, blisters or sores at the infusion site Pain, tingling, or numbness in the hands or feet Unusual bruising or bleeding Side effects that usually do not require medical attention (report to your care team if they continue or are bothersome): Hair loss Nausea Vomiting This list may not describe all possible side effects. Call your doctor for medical advice about side effects. You may report side effects to FDA at 1-800-FDA-1088. Where should I keep my medication? This medication is given in a hospital or clinic. It will not be stored at home. NOTE: This sheet is a summary. It may not cover all possible information. If you have questions about this medicine, talk to your doctor, pharmacist, or health care provider.  2024 [employer] Standard (2021-07-18 00:00:00)

## 2023-04-19 NOTE — Telephone Encounter (Signed)
1/20 @ 3:00 pm Patient's wife called to change his treatment appt for 1/24 to a later time.  Email sent to Support RTT and copied L1, L2, L3, L4 machines so they are aware.

## 2023-04-20 ENCOUNTER — Telehealth: Payer: Self-pay | Admitting: *Deleted

## 2023-04-20 ENCOUNTER — Ambulatory Visit
Admission: RE | Admit: 2023-04-20 | Discharge: 2023-04-20 | Disposition: A | Discharge status: Home / Self Care | Payer: Medicare Other | Source: Ambulatory Visit | Attending: Radiation Oncology

## 2023-04-20 ENCOUNTER — Other Ambulatory Visit: Payer: Self-pay

## 2023-04-20 DIAGNOSIS — Z510 Encounter for antineoplastic radiation therapy: Secondary | ICD-10-CM | POA: Diagnosis not present

## 2023-04-20 LAB — RAD ONC ARIA SESSION SUMMARY
Course Elapsed Days: 6
Plan Fractions Treated to Date: 5
Plan Prescribed Dose Per Fraction: 2 Gy
Plan Total Fractions Prescribed: 35
Plan Total Prescribed Dose: 70 Gy
Reference Point Dosage Given to Date: 10 Gy
Reference Point Session Dosage Given: 2 Gy
Session Number: 5

## 2023-04-20 NOTE — Telephone Encounter (Signed)
Called pt to see how he did with recent treatment.  He reports doing well & denies any side effects.  He reports knowing how to reach [place] if needed & knows his next appts.

## 2023-04-21 ENCOUNTER — Other Ambulatory Visit: Payer: Self-pay

## 2023-04-21 ENCOUNTER — Encounter (HOSPITAL_COMMUNITY): Payer: Medicare Other

## 2023-04-21 ENCOUNTER — Ambulatory Visit
Admission: RE | Admit: 2023-04-21 | Discharge: 2023-04-21 | Disposition: A | Discharge status: Home / Self Care | Payer: Medicare Other | Source: Ambulatory Visit | Attending: Radiation Oncology | Admitting: Radiation Oncology

## 2023-04-21 DIAGNOSIS — Z510 Encounter for antineoplastic radiation therapy: Secondary | ICD-10-CM | POA: Diagnosis not present

## 2023-04-21 LAB — RAD ONC ARIA SESSION SUMMARY
Course Elapsed Days: 7
Plan Fractions Treated to Date: 6
Plan Prescribed Dose Per Fraction: 2 Gy
Plan Total Fractions Prescribed: 35
Plan Total Prescribed Dose: 70 Gy
Reference Point Dosage Given to Date: 12 Gy
Reference Point Session Dosage Given: 2 Gy
Session Number: 6

## 2023-04-22 ENCOUNTER — Ambulatory Visit
Admission: RE | Admit: 2023-04-22 | Discharge: 2023-04-22 | Disposition: A | Discharge status: Home / Self Care | Payer: Medicare Other | Source: Ambulatory Visit | Attending: Radiation Oncology | Admitting: Radiation Oncology

## 2023-04-22 ENCOUNTER — Inpatient Hospital Stay: Payer: Medicare Other

## 2023-04-22 ENCOUNTER — Other Ambulatory Visit: Payer: Self-pay

## 2023-04-22 DIAGNOSIS — Z510 Encounter for antineoplastic radiation therapy: Secondary | ICD-10-CM | POA: Diagnosis not present

## 2023-04-22 LAB — RAD ONC ARIA SESSION SUMMARY
Course Elapsed Days: 8
Plan Fractions Treated to Date: 7
Plan Prescribed Dose Per Fraction: 2 Gy
Plan Total Fractions Prescribed: 35
Plan Total Prescribed Dose: 70 Gy
Reference Point Dosage Given to Date: 14 Gy
Reference Point Session Dosage Given: 2 Gy
Session Number: 7

## 2023-04-23 ENCOUNTER — Inpatient Hospital Stay: Payer: Medicare Other

## 2023-04-23 ENCOUNTER — Inpatient Hospital Stay (HOSPITAL_BASED_OUTPATIENT_CLINIC_OR_DEPARTMENT_OTHER): Payer: Medicare Other | Admitting: Oncology

## 2023-04-23 ENCOUNTER — Other Ambulatory Visit: Payer: Medicare Other

## 2023-04-23 ENCOUNTER — Encounter: Payer: Self-pay | Admitting: Oncology

## 2023-04-23 ENCOUNTER — Ambulatory Visit
Admission: RE | Admit: 2023-04-23 | Discharge: 2023-04-23 | Disposition: A | Discharge status: Home / Self Care | Payer: Medicare Other | Source: Ambulatory Visit | Attending: Radiation Oncology | Admitting: Radiation Oncology

## 2023-04-23 ENCOUNTER — Other Ambulatory Visit: Payer: Self-pay

## 2023-04-23 VITALS — BP 138/89 | HR 58 | Temp 98.5°F | Resp 16 | Ht 64.0 in | Wt 153.7 lb

## 2023-04-23 DIAGNOSIS — Z510 Encounter for antineoplastic radiation therapy: Secondary | ICD-10-CM | POA: Diagnosis not present

## 2023-04-23 DIAGNOSIS — C321 Malignant neoplasm of supraglottis: Secondary | ICD-10-CM

## 2023-04-23 DIAGNOSIS — Z5111 Encounter for antineoplastic chemotherapy: Secondary | ICD-10-CM | POA: Diagnosis not present

## 2023-04-23 LAB — RAD ONC ARIA SESSION SUMMARY
Course Elapsed Days: 9
Plan Fractions Treated to Date: 8
Plan Prescribed Dose Per Fraction: 2 Gy
Plan Total Fractions Prescribed: 35
Plan Total Prescribed Dose: 70 Gy
Reference Point Dosage Given to Date: 16 Gy
Reference Point Session Dosage Given: 2 Gy
Session Number: 8

## 2023-04-23 LAB — CBC WITH DIFFERENTIAL (CANCER CENTER ONLY)
Abs Immature Granulocytes: 0.01 10*3/uL (ref 0.00–0.07)
Basophils Absolute: 0 10*3/uL (ref 0.0–0.1)
Basophils Relative: 0 %
Eosinophils Absolute: 0.2 10*3/uL (ref 0.0–0.5)
Eosinophils Relative: 3 %
HCT: 42.8 % (ref 39.0–52.0)
Hemoglobin: 14.4 g/dL (ref 13.0–17.0)
Immature Granulocytes: 0 %
Lymphocytes Relative: 34 %
Lymphs Abs: 1.6 10*3/uL (ref 0.7–4.0)
MCH: 29.4 pg (ref 26.0–34.0)
MCHC: 33.6 g/dL (ref 30.0–36.0)
MCV: 87.5 fL (ref 80.0–100.0)
Monocytes Absolute: 0.7 10*3/uL (ref 0.1–1.0)
Monocytes Relative: 14 %
Neutro Abs: 2.3 10*3/uL (ref 1.7–7.7)
Neutrophils Relative %: 49 %
Platelet Count: 200 10*3/uL (ref 150–400)
RBC: 4.89 MIL/uL (ref 4.22–5.81)
RDW: 13.5 % (ref 11.5–15.5)
WBC Count: 4.8 10*3/uL (ref 4.0–10.5)
nRBC: 0 % (ref 0.0–0.2)

## 2023-04-23 LAB — BASIC METABOLIC PANEL - CANCER CENTER ONLY
Anion gap: 4 — ABNORMAL LOW (ref 5–15)
BUN: 13 mg/dL (ref 6–20)
CO2: 33 mmol/L — ABNORMAL HIGH (ref 22–32)
Calcium: 9.7 mg/dL (ref 8.9–10.3)
Chloride: 103 mmol/L (ref 98–111)
Creatinine: 1.09 mg/dL (ref 0.61–1.24)
GFR, Estimated: >60 mL/min (ref >60)
Glucose, Bld: 82 mg/dL (ref 70–99)
Potassium: 4.5 mmol/L (ref 3.5–5.1)
Sodium: 140 mmol/L (ref 135–145)

## 2023-04-23 LAB — MAGNESIUM: Magnesium: 2.2 mg/dL (ref 1.7–2.4)

## 2023-04-23 MED FILL — Fosaprepitant Dimeglumine For IV Infusion 150 MG (Base Eq): INTRAVENOUS | Qty: 5 | Status: AC

## 2023-04-23 NOTE — Progress Notes (Signed)
[facility]  ONCOLOGY CLINIC PROGRESS NOTE   Patient Care Team: [name], MD as PCP - General (Internal Medicine) West Bali, MD (Inactive) as Consulting Physician (Gastroenterology) Lonie Peak, MD as Attending Physician (Radiation Oncology)  PATIENT NAME: [name]   MR#: [id number] DOB: March 01, 1971  Date of visit: 04/23/2023   ASSESSMENT & PLAN:   [name] is a 53 y.o. 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   Malignant neoplasm of supraglottis (HCC) Supraglottic cancer with one suspicious 7 mm lymph node on PET scan, classified as T3, N1, M0, stage III, low volume disease.   Discussed his case in our tumor conference on 04/14/2023.  Based on imaging studies, it is classified as cT3 lesion.  cN1.  Hence plan is to proceed with concurrent chemoradiation.  We have discussed about role of cisplatin being a radiosensitizer in the treatment of head and neck cancer.  We have discussed about the curative intent of chemoradiation for this patient.     We have discussed about mechanism of action of cisplatin, adverse effects of cisplatin including but not limited to fatigue, nausea, vomiting, increased risk of infections, mucositis, ototoxicity, nephrotoxicity, peripheral neuropathy.  Patient understands that some of the side effects can be permanent and even potentially fatal.  We have discussed about role of Mediport and G-tube for chemotherapy administration and nutrition respectively since most of these patients have severe mucositis during the treatment.  At this time we do not know if weekly cisplatin is inferior to every 21 days cisplatin since there is no head-to-head comparison trial.  I did mention to the patient however weekly cisplatin is well-tolerated with less adverse effects  and most of the patients tend to complete treatment as planned.  Patient is willing to proceed with weekly cisplatin.  -He began radiation treatments from 04/14/2023.  Began chemotherapy with cisplatin from 04/19/2023.  Tolerated first cycle of cisplatin well without any noticeable side effects.  -Labs today reveal no dose-limiting toxicities.  Will proceed with cisplatin weekly during the course of radiation.  He is scheduled for cycle 2 on 04/26/2023.  Cisplatin will be given at a dose of 40 mg/m weekly.   -His Port-A-Cath and feeding tube placement are not scheduled until 04/28/2023.  If an earlier appointment is available, we will expedite this.  We will continue chemotherapy through peripheral IV up until that point.  -He has prescriptions available for nausea medicines.  Emphasized adequate hydration.  Patient verbalized understanding.  RTC in 1 week for labs, follow-up.    I reviewed lab results and outside records for this visit and discussed relevant results with the patient. Diagnosis, plan of care and treatment options were also discussed in detail with the patient. Opportunity provided to ask questions and answers provided to his apparent satisfaction. Provided instructions to call our clinic with any problems, questions or concerns prior to return visit. I recommended to continue follow-up with PCP and sub-specialists. He verbalized understanding and agreed with the plan.   NCCN guidelines have been consulted in the planning of this patient's care.  I spent a total of 30 minutes during this encounter with the patient including review of chart and various tests results, discussions about plan of care and coordination of care plan.   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   Current Treatment: Concurrent chemoradiation with weekly cisplatin.  Radiation started from 04/14/2023.  Cisplatin started from 04/19/2023.  INTERVAL HISTORY:    Discussed the use of AI scribe software for clinical note transcription with the patient, who gave verbal consent to proceed.   [name] is here today for repeat clinical assessment.   He presents for a follow-up after his first chemotherapy treatment. He reports that the treatment is going "pretty well" but notes that his voice strength "comes and goes." He has also lost his sense of taste, which has affected his appetite, but he continues to force himself to eat. He has not yet had a port placed for chemotherapy administration, but it is scheduled for the following week. He reports a slight ringing in his ears but denies any nausea, vomiting, or tingling and numbness in the hands.  I have reviewed the past medical history, past surgical history, social history and family history with the patient and they are unchanged from previous note.  HISTORY OF PRESENT ILLNESS:   ONCOLOGY HISTORY:   He initially presented to the [facility] on 10/28/22 with c/o left ear pain, popping with chewing, and pain radiating down the side of his neck and throat x 1 month. He was treated for a left ear infection with a course of prednisone.   His symptoms including left ear and throat pain however persisted and progressed and he was subsequently referred to Dr. Suszanne Conners on 03/10/23 at [facility] for further evaluation. During which time, the patient detailed that his pain first began in the ear and eventually progressed to involve the left side of his throat.  A laryngoscopy was subsequently performed at that time which revealed an ulcerative lesion on the posterior aspect of the epiglottis, superior to the  vocal cords. The arytenoid mucosa was also mildly edematous and the true vocal folds were pale yellow, mobile, and without mass or lesion.       He accordingly underwent a direct laryngoscopy with biopsies of the left supraglottic tumor/mass on 03/16/23. Pathology showed findings consistent with invasive and in situ moderately differentiated squamous cell carcinoma. Per note by Dr Suszanne Conners, "Examination of the vallecula, piriform sinuses, and the pharyngeal mucosa were all normal. A fungating mass was noted on the posterior aspect of the left epiglottis. The mass did not appear to involve the vocal cords."   Soft tissue neck CT and PET scan on 03/23/23 were done - demonstrating a left supraglottic lesion which may involve the glottis as well as a 7 mm left level 3 lymph node that is suspicious for metastatic involvement.  No distant metastatic disease.  cT3,cN1,cM0, Stage III disease.   Plan made to proceed with concurrent chemoradiation with weekly cisplatin.  Oncology History  Malignant neoplasm of supraglottis (HCC)  03/22/2023 Initial Diagnosis   Malignant neoplasm of supraglottis (HCC)   03/26/2023 Cancer Staging   Staging form: Larynx - Supraglottis, AJCC 8th Edition - Clinical stage from 03/26/2023: Stage III (cT3, cN1, cM0) - Signed by Lonie Peak, MD on 04/14/2023 Stage prefix: Initial diagnosis   04/19/2023 -  Chemotherapy   Patient is on Treatment Plan :  HEAD/NECK Cisplatin (40) q7d         REVIEW OF SYSTEMS:   Review of Systems - Oncology  All other pertinent systems were reviewed with the patient and are negative.  ALLERGIES: He has no known allergies.  MEDICATIONS:  Current Outpatient Medications  Medication Sig Dispense Refill   amlodipine-olmesartan (AZOR) 10-20 MG tablet Take 1 tablet by mouth daily. 30 tablet 1   dexamethasone (DECADRON) 4 MG tablet Take 2 tablets (8 mg) by mouth daily x 3 days starting the day after cisplatin chemotherapy. Take with food. 30  tablet 1   famotidine (PEPCID) 40 MG tablet Take 1 tablet (40 mg total) by mouth daily. 30 tablet 3   FLUoxetine (PROZAC) 20 MG capsule Take 1 capsule (20 mg total) by mouth daily. 30 capsule 3   fluticasone (FLONASE) 50 MCG/ACT nasal spray Place 1 spray into both nostrils 2 (two) times daily. 16 g 2   hydrOXYzine (ATARAX) 25 MG tablet Take 1 tablet (25 mg total) by mouth 3 (three) times daily as needed for anxiety. 90 tablet 1   lidocaine (XYLOCAINE) 2 % solution Patient: Mix 1part 2% viscous lidocaine, 1part H20. Swallow 10mL of diluted mixture, before meals and at bedtime, up to QID 200 mL 3   lidocaine-prilocaine (EMLA) cream Apply to affected area once 30 g 3   metoprolol succinate (TOPROL-XL) 25 MG 24 hr tablet Take 1 tablet (25 mg total) by mouth daily. 30 tablet 1   nicotine (NICODERM CQ - DOSED IN MG/24 HOURS) 14 mg/24hr patch Place 1 patch (14 mg total) onto the skin daily. Apply 21 mg patch daily x 6 wk, then 14mg  patch daily x 2 wk, then 7 mg patch daily x 2 wk 14 patch 0   nicotine (NICODERM CQ - DOSED IN MG/24 HOURS) 21 mg/24hr patch Place 1 patch (21 mg total) onto the skin daily. Apply 21 mg patch daily x 6 wk, then 14mg  patch daily x 2 wk, then 7 mg patch daily x 2 wk 14 patch 2   nicotine (NICODERM CQ - DOSED IN MG/24 HR) 7 mg/24hr patch Place 1 patch (7 mg total) onto the skin daily. Apply 21 mg patch daily x 6 wk, then 14mg  patch daily x 2 wk, then 7 mg patch daily x 2 wk 14 patch 0   ondansetron (ZOFRAN) 8 MG tablet Take 1 tablet (8 mg total) by mouth every 8 (eight) hours as needed for nausea or vomiting. Start on the third day after cisplatin. 30 tablet 1   prochlorperazine (COMPAZINE) 10 MG tablet Take 1 tablet (10 mg total) by mouth every 6 (six) hours as needed (Nausea or vomiting). 30 tablet 1   [START ON 05/02/2023] QUEtiapine (SEROQUEL) 200 MG tablet Take 1 tablet (200 mg total) by mouth at bedtime. 30 tablet 2   QUEtiapine (SEROQUEL) 50 MG tablet Take one pill nightly  for 1 week. Then increase to 2 pills nightly for 1 week. Then increase to 3 pills nightly for one week. Then increase to 4 pills nightly for one week. 90 tablet 0   No current facility-administered medications for this visit.     VITALS:   Blood pressure 138/89, pulse (!) 58, temperature 98.5 F (36.9 C), temperature source Temporal, resp. rate 16, height 5\' 4"  (1.626 m), weight 153 lb 11.2 oz (69.7 kg), SpO2 100%.  Wt Readings from Last 3 Encounters:  04/23/23 153 lb 11.2 oz (69.7 kg)  04/19/23 155 lb 8 oz (70.5 kg)  04/09/23 151 lb 12.8 oz (68.9 kg)    Body mass index is 26.38 kg/m.  Performance status (ECOG): 1 - Symptomatic but completely ambulatory  PHYSICAL EXAM:   Physical Exam Constitutional:      General: He is not in acute distress.    Appearance: Normal appearance.  HENT:     Head: Normocephalic and atraumatic.  Eyes:     General: No scleral icterus.    Conjunctiva/sclera: Conjunctivae normal.  Cardiovascular:     Rate and Rhythm: Normal rate and regular rhythm.     Heart sounds: Normal heart sounds.  Pulmonary:     Effort: Pulmonary effort is normal.     Breath sounds: Normal breath sounds.  Abdominal:     General: There is no distension.  Musculoskeletal:     Right lower leg: No edema.     Left lower leg: No edema.  Neurological:     General: No focal deficit present.     Mental Status: He is alert and oriented to person, place, and time.  Psychiatric:        Mood and Affect: Mood normal.        Behavior: Behavior normal.        Thought Content: Thought content normal.       LABORATORY DATA:   I have reviewed the data as listed.  Results for orders placed or performed in visit on 04/23/23  Magnesium  Result Value Ref Range   Magnesium 2.2 1.7 - 2.4 mg/dL  Basic Metabolic Panel - Cancer Center Only  Result Value Ref Range   Sodium 140 135 - 145 mmol/L   Potassium 4.5 3.5 - 5.1 mmol/L   Chloride 103 98 - 111 mmol/L   CO2 33 (H) 22 - 32  mmol/L   Glucose, Bld 82 70 - 99 mg/dL   BUN 13 6 - 20 mg/dL   Creatinine 1.61 0.96 - 1.24 mg/dL   Calcium 9.7 8.9 - 04.5 mg/dL   GFR, Estimated >40 >98 mL/min   Anion gap 4 (L) 5 - 15  CBC with Differential (Cancer Center Only)  Result Value Ref Range   WBC Count 4.8 4.0 - 10.5 K/uL   RBC 4.89 4.22 - 5.81 MIL/uL   Hemoglobin 14.4 13.0 - 17.0 g/dL   HCT 11.9 14.7 - 82.9 %   MCV 87.5 80.0 - 100.0 fL   MCH 29.4 26.0 - 34.0 pg   MCHC 33.6 30.0 - 36.0 g/dL   RDW 56.2 13.0 - 86.5 %   Platelet Count 200 150 - 400 K/uL   nRBC 0.0 0.0 - 0.2 %   Neutrophils Relative % 49 %   Neutro Abs 2.3 1.7 - 7.7 K/uL   Lymphocytes Relative 34 %   Lymphs Abs 1.6 0.7 - 4.0 K/uL   Monocytes Relative 14 %   Monocytes Absolute 0.7 0.1 - 1.0 K/uL   Eosinophils Relative 3 %   Eosinophils Absolute 0.2 0.0 - 0.5 K/uL   Basophils Relative 0 %   Basophils Absolute 0.0 0.0 - 0.1 K/uL   Immature Granulocytes 0 %   Abs Immature Granulocytes 0.01 0.00 - 0.07 K/uL  Results for orders placed or performed in visit on 04/23/23  Rad Onc Aria Session Summary  Result Value Ref Range   Course ID [id number]    Course Start Date 04/05/2023    Session Number 8    Course First Treatment Date 04/14/2023  9:27 AM    Course Last Treatment Date 04/23/2023  1:57 PM    Course Elapsed Days 9    Reference Point ID HN    Reference Point Dosage Given to Date 16 Gy   Reference Point Session Dosage Given 2 Gy   Plan ID HN    Plan Fractions Treated to Date 8    Plan Total Fractions Prescribed 35    Plan Prescribed Dose Per Fraction 2 Gy   Plan Total Prescribed Dose 70.000000 Gy   Plan Primary Reference Point HN      RADIOGRAPHIC STUDIES:  I have personally reviewed the radiological images as listed and agree with the findings in the report.  No results found.   CODE STATUS:  Code Status History     Date Active Date Inactive Code Status Order ID Comments User Context   06/23/2017 1540 06/28/2017 2102 Full Code [id number]  Laveda Abbe, NP Inpatient   06/23/2017 0327 06/23/2017 1502 Full Code [id number]  Gregary Cromer ED   01/13/2017 0858 01/13/2017 1329 Full Code [id number]  Lucretia Roers, MD Inpatient   11/05/2016 0018 11/10/2016 1937 Full Code [id number]  Kerry Hough, PA-C Inpatient       No orders of the defined types were placed in this encounter.    Future Appointments  Date Time Provider Department Center  04/26/2023  9:30 AM [facility] [id number] [facility] None  04/26/2023  9:45 AM LINAC-SQUIRE [facility] None  04/26/2023 10:00 AM [facility] INFUSION [facility] None  04/26/2023 11:15 AM Alphonse Guild, RD [facility] None  04/27/2023 10:00 AM [facility] [id number] [facility] None  04/28/2023  7:30 AM [facility]-MDCC ROOM [facility]-MD[facility] None  04/28/2023  9:30 AM WL-IR 1 WL-IR [place]  04/28/2023 10:00 AM WL-IR 1 WL-IR [place]  04/28/2023 11:00 AM WL-REHBL SPEECH THERAPIST WL-REHBL [place]  04/28/2023 11:00 AM WL-DG R/F 1 WL-DG [place]  04/28/2023  2:45 PM [facility] LINAC 3 [facility] None  04/29/2023  9:30 AM [facility] [id number] [facility] None  04/30/2023  2:30 PM [facility] MED[facility]C FLUSH [facility] None  04/30/2023  3:15 PM [facility] [id number] [facility] None  04/30/2023  3:40 PM Byron Peacock, MD [facility] None  05/03/2023  9:30 AM [facility] [id number] [facility] None  05/03/2023 10:00 AM [facility] INFUSION [facility] None  05/03/2023  1:45 PM Alphonse Guild, RD [facility] None  05/04/2023  9:30 AM [facility] [id number] [facility] None  05/05/2023  9:30 AM [facility] [id number] [facility] None  05/06/2023  9:30 AM [facility] [id number] [facility] None  05/07/2023  9:00 AM [facility] MEDONC FLUSH [facility]MED[facility] None  05/07/2023  9:30 AM [facility] [id number] [facility] None  05/07/2023 10:00 AM Julie Nay, MD [facility] None  05/07/2023 10:20 AM Brenetta Penny, MD [facility] None  05/10/2023  9:30 AM [facility] [id number] [facility] None  05/10/2023 10:00 AM [facility] INFUSION [facility]MED[facility]  None  05/10/2023 11:15 AM Alphonse Guild, RD [facility] None  05/11/2023  9:30 AM [facility] [id number] [facility] None  05/12/2023  9:30 AM [facility] [id number] [facility] None  05/13/2023  9:30 AM [facility] [id number] [facility] None  05/14/2023  9:30 AM [facility] [id number] [facility] None  05/14/2023 10:30 AM [facility] MED[facility]C FLUSH [facility]MED[facility] None  05/14/2023 11:20 AM Riad Wagley, MD [facility] None  05/17/2023  9:30 AM [facility] [id number] [facility] None  05/17/2023 10:00 AM [facility] INFUSION [facility] None  05/17/2023 11:15 AM Alphonse Guild, RD [facility] None  05/18/2023  9:30 AM [facility] [id number] [facility] None  05/19/2023  9:30 AM [facility] [id number] [facility] None  05/20/2023  9:30 AM [facility] [id number] [facility] None  05/21/2023  9:30 AM [facility] [id number] [facility] None  05/24/2023  8:00  AM [facility] MEDONC FLUSH [facility]MED[facility] None  05/24/2023  8:30 AM Pollyann Samples, NP [facility] None  05/24/2023  9:30 AM [facility] [id number] [facility] None  05/24/2023 10:00 AM [facility] INFUSION [facility] None  05/24/2023 12:00 PM Alphonse Guild, RD [facility] None  05/25/2023  9:30 AM [facility] [id number] [facility] None  05/26/2023  9:30 AM [facility] [id number] [facility] None  05/27/2023  9:30 AM [facility]RAD[facility] [id number] [facility]RAD[facility] None  05/28/2023  9:30 AM [facility] [id number] [facility] None  05/28/2023  2:15 PM [facility] MED[facility]C FLUSH [facility]MED[facility] None  05/28/2023  3:00 PM Oveta Idris, MD [facility] None  05/31/2023  9:30 AM [facility] [id number] [facility] None  05/31/2023 10:00 AM [facility] INFUSION [facility] None  05/31/2023 11:15 AM Alphonse Guild, RD [facility] None  06/01/2023  9:30 AM [facility] [id number] [facility] None  08/04/2023  1:40 PM [name], MD RPC-RPC RPC      This document was completed utilizing speech recognition software. Grammatical errors, random word insertions, pronoun errors, and incomplete sentences are an occasional consequence of this  system due to software limitations, ambient noise, and hardware issues. Any formal questions or concerns about the content, text or information contained within the body of this dictation should be directly addressed to the provider for clarification.

## 2023-04-23 NOTE — Assessment & Plan Note (Signed)
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   Discussed his case in our tumor conference on 04/14/2023.  Based on imaging studies, it is classified as cT3 lesion.  cN1.  Hence plan is to proceed with concurrent chemoradiation.  We have discussed about role of cisplatin being a radiosensitizer in the treatment of head and neck cancer.  We have discussed about the curative intent of chemoradiation for this patient.     We have discussed about mechanism of action of cisplatin, adverse effects of cisplatin including but not limited to fatigue, nausea, vomiting, increased risk of infections, mucositis, ototoxicity, nephrotoxicity, peripheral neuropathy.  Patient understands that some of the side effects can be permanent and even potentially fatal.  We have discussed about role of Mediport and G-tube for chemotherapy administration and nutrition respectively since most of these patients have severe mucositis during the treatment.  At this time we do not know if weekly cisplatin is inferior to every 21 days cisplatin since there is no head-to-head comparison trial.  I did mention to the patient however weekly cisplatin is well-tolerated with less adverse effects and most of the patients tend to complete treatment as planned.  Patient is willing to proceed with weekly cisplatin.  -He began radiation treatments from 04/14/2023.  Began chemotherapy with cisplatin from 04/19/2023.  Tolerated first cycle of cisplatin well without any noticeable side effects.  -Labs today reveal no dose-limiting toxicities.  Will proceed with cisplatin weekly during the course of radiation.  He is scheduled for cycle 2 on 04/26/2023.  Cisplatin will be given at a dose of 40 mg/m weekly.   -His Port-A-Cath and feeding tube placement are not scheduled until 04/28/2023.  If an earlier appointment is available, we will expedite this.  We will continue  chemotherapy through peripheral IV up until that point.  -He has prescriptions available for nausea medicines.  Emphasized adequate hydration.  Patient verbalized understanding.  RTC in 1 week for labs, follow-up.

## 2023-04-26 ENCOUNTER — Inpatient Hospital Stay: Payer: Medicare Other

## 2023-04-26 ENCOUNTER — Ambulatory Visit: Payer: Medicare Other

## 2023-04-26 ENCOUNTER — Other Ambulatory Visit: Payer: Self-pay

## 2023-04-26 ENCOUNTER — Ambulatory Visit
Admission: RE | Admit: 2023-04-26 | Discharge: 2023-04-26 | Disposition: A | Discharge status: Home / Self Care | Payer: Medicare Other | Source: Ambulatory Visit | Attending: Radiation Oncology

## 2023-04-26 ENCOUNTER — Other Ambulatory Visit: Payer: Medicare Other

## 2023-04-26 VITALS — BP 113/83 | HR 67 | Temp 98.0°F | Resp 18 | Wt 152.2 lb

## 2023-04-26 DIAGNOSIS — Z510 Encounter for antineoplastic radiation therapy: Secondary | ICD-10-CM | POA: Diagnosis not present

## 2023-04-26 DIAGNOSIS — C321 Malignant neoplasm of supraglottis: Secondary | ICD-10-CM

## 2023-04-26 LAB — RAD ONC ARIA SESSION SUMMARY
Course Elapsed Days: 12
Plan Fractions Treated to Date: 9
Plan Prescribed Dose Per Fraction: 2 Gy
Plan Total Fractions Prescribed: 35
Plan Total Prescribed Dose: 70 Gy
Reference Point Dosage Given to Date: 18 Gy
Reference Point Session Dosage Given: 2 Gy
Session Number: 9

## 2023-04-26 MED ORDER — PALONOSETRON HCL INJECTION 0.25 MG/5ML
0.2500 mg | Freq: Once | INTRAVENOUS | Status: AC
  Administered 2023-04-26: 0.25 mg via INTRAVENOUS
  Filled 2023-04-26: qty 5

## 2023-04-26 MED ORDER — SODIUM CHLORIDE 0.9 % IV SOLN
150.0000 mg | Freq: Once | INTRAVENOUS | Status: AC
  Administered 2023-04-26: 150 mg via INTRAVENOUS
  Filled 2023-04-26: qty 150

## 2023-04-26 MED ORDER — MAGNESIUM SULFATE 2 GM/50ML IV SOLN
2.0000 g | Freq: Once | INTRAVENOUS | Status: AC
  Administered 2023-04-26: 2 g via INTRAVENOUS
  Filled 2023-04-26: qty 50

## 2023-04-26 MED ORDER — DEXAMETHASONE SODIUM PHOSPHATE 10 MG/ML IJ SOLN
10.0000 mg | Freq: Once | INTRAMUSCULAR | Status: AC
  Administered 2023-04-26: 10 mg via INTRAVENOUS
  Filled 2023-04-26: qty 1

## 2023-04-26 MED ORDER — POTASSIUM CHLORIDE IN NACL 20-0.9 MEQ/L-% IV SOLN
Freq: Once | INTRAVENOUS | Status: AC
  Filled 2023-04-26: qty 1000

## 2023-04-26 MED ORDER — SODIUM CHLORIDE 0.9 % IV SOLN
40.0000 mg/m2 | Freq: Once | INTRAVENOUS | Status: AC
  Administered 2023-04-26: 70 mg via INTRAVENOUS
  Filled 2023-04-26: qty 70

## 2023-04-26 MED ORDER — SODIUM CHLORIDE 0.9 % IV SOLN: INTRAVENOUS | Status: DC

## 2023-04-26 NOTE — Progress Notes (Signed)
Patient presents today for chemotherapy infusion of Cisplatin.  Patient is in satisfactory condition with no new complaints voiced.  Vital signs are stable.  Labs reviewed and all labs are within treatment parameters.  We will proceed with treatment per MD orders.    Patient able to produce urine ouput of .  Patient tolerated treatment well with no complaints voiced.  Patient left ambulatory in stable condition.  Vital signs stable at discharge.  Follow up as scheduled.

## 2023-04-26 NOTE — Patient Instructions (Signed)
[facility] - A DEPT OF [facility]  Discharge Instructions: Thank you for choosing [facility] to provide your oncology and hematology care.   If you have a lab appointment with the Cancer Center, please go directly to the Cancer Center and check in at the registration area.   Wear comfortable clothing and clothing appropriate for easy access to any Portacath or PICC line.   We strive to give you quality time with your provider. You may need to reschedule your appointment if you arrive late (15 or more minutes).  Arriving late affects you and other patients whose appointments are after yours.  Also, if you miss three or more appointments without notifying the office, you may be dismissed from the clinic at the provider's discretion.      For prescription refill requests, have your pharmacy contact our office and allow 72 hours for refills to be completed.    Today you received the following chemotherapy and/or immunotherapy agents Cisplatin.  Cisplatin Injection What is this medication? CISPLATIN (SIS pla tin) treats some types of cancer. It works by slowing down the growth of cancer cells. This medicine may be used for other purposes; ask your health care provider or pharmacist if you have questions. COMMON BRAND NAME(S): Platinol, Platinol -AQ What should I tell my care team before I take this medication? They need to know if you have any of these conditions: Eye disease, vision problems Hearing problems Kidney disease Low blood counts, such as low white cells, platelets, or red blood cells Tingling of the fingers or toes, or other nerve disorder An unusual or allergic reaction to cisplatin, carboplatin, oxaliplatin, other medications, foods, dyes, or preservatives If you or your partner are pregnant or trying to get pregnant Breast-feeding How should I use this medication? This medication is injected into a vein. It is given by your care  team in a hospital or clinic setting. Talk to your care team about the use of this medication in children. Special care may be needed. Overdosage: If you think you have taken too much of this medicine contact a poison control center or emergency room at once. NOTE: This medicine is only for you. Do not share this medicine with others. What if I miss a dose? Keep appointments for follow-up doses. It is important not to miss your dose. Call your care team if you are unable to keep an appointment. What may interact with this medication? Do not take this medication with any of the following: Live virus vaccines This medication may also interact with the following: Certain antibiotics, such as amikacin, gentamicin, neomycin, polymyxin B, streptomycin, tobramycin, vancomycin Foscarnet This list may not describe all possible interactions. Give your health care provider a list of all the medicines, herbs, non-prescription drugs, or dietary supplements you use. Also tell them if you smoke, drink alcohol, or use illegal drugs. Some items may interact with your medicine. What should I watch for while using this medication? Your condition will be monitored carefully while you are receiving this medication. You may need blood work done while taking this medication. This medication may make you feel generally unwell. This is not uncommon, as chemotherapy can affect healthy cells as well as cancer cells. Report any side effects. Continue your course of treatment even though you feel ill unless your care team tells you to stop. This medication may increase your risk of getting an infection. Call your care team for advice  if you get a fever, chills, sore throat, or other symptoms of a cold or flu. Do not treat yourself. Try to avoid being around people who are sick. Avoid taking medications that contain aspirin, acetaminophen, ibuprofen, naproxen, or ketoprofen unless instructed by your care team. These medications  may hide a fever. This medication may increase your risk to bruise or bleed. Call your care team if you notice any unusual bleeding. Be careful brushing or flossing your teeth or using a toothpick because you may get an infection or bleed more easily. If you have any dental work done, tell your dentist you are receiving this medication. Drink fluids as directed while you are taking this medication. This will help protect your kidneys. Call your care team if you get diarrhea. Do not treat yourself. Talk to your care team if you or your partner wish to become pregnant or think you might be pregnant. This medication can cause serious birth defects if taken during pregnancy and for 14 months after the last dose. A negative pregnancy test is required before starting this medication. A reliable form of contraception is recommended while taking this medication and for 14 months after the last dose. Talk to your care team about effective forms of contraception. Do not father a child while taking this medication and for 11 months after the last dose. Use a condom during sex during this time period. Do not breast-feed while taking this medication. This medication may cause infertility. Talk to your care team if you are concerned about your fertility. What side effects may I notice from receiving this medication? Side effects that you should report to your care team as soon as possible: Allergic reactions--skin rash, itching, hives, swelling of the face, lips, tongue, or throat Eye pain, change in vision, vision loss Hearing loss, ringing in ears Infection--fever, chills, cough, sore throat, wounds that don't heal, pain or trouble when passing urine, general feeling of discomfort or being unwell Kidney injury--decrease in the amount of urine, swelling of the ankles, hands, or feet Low red blood cell level--unusual weakness or fatigue, dizziness, headache, trouble breathing Painful swelling, warmth, or redness  of the skin, blisters or sores at the infusion site Pain, tingling, or numbness in the hands or feet Unusual bruising or bleeding Side effects that usually do not require medical attention (report to your care team if they continue or are bothersome): Hair loss Nausea Vomiting This list may not describe all possible side effects. Call your doctor for medical advice about side effects. You may report side effects to FDA at 1-800-FDA-1088. Where should I keep my medication? This medication is given in a hospital or clinic. It will not be stored at home. NOTE: This sheet is a summary. It may not cover all possible information. If you have questions about this medicine, talk to your doctor, pharmacist, or health care provider.  2024 Elsevier/Gold Standard (2021-07-18 00:00:00)   To help prevent nausea and vomiting after your treatment, we encourage you to take your nausea medication as directed.  BELOW ARE SYMPTOMS THAT SHOULD BE REPORTED IMMEDIATELY: *FEVER GREATER THAN 100.4 F (38 C) OR HIGHER *CHILLS OR SWEATING *NAUSEA AND VOMITING THAT IS NOT CONTROLLED WITH YOUR NAUSEA MEDICATION *UNUSUAL SHORTNESS OF BREATH *UNUSUAL BRUISING OR BLEEDING *URINARY PROBLEMS (pain or burning when urinating, or frequent urination) *BOWEL PROBLEMS (unusual diarrhea, constipation, pain near the anus) TENDERNESS IN MOUTH AND THROAT WITH OR WITHOUT PRESENCE OF ULCERS (sore throat, sores in mouth, or a toothache) UNUSUAL RASH,  SWELLING OR PAIN  UNUSUAL VAGINAL DISCHARGE OR ITCHING   Items with * indicate a potential emergency and should be followed up as soon as possible or go to the Emergency Department if any problems should occur.  Please show the CHEMOTHERAPY ALERT CARD or IMMUNOTHERAPY ALERT CARD at check-in to the Emergency Department and triage nurse.  Should you have questions after your visit or need to cancel or reschedule your appointment, please contact [facility] - A DEPT OF [facility]  Dept: [phone]  and follow the prompts.  Office hours are 8:00 a.m. to 4:30 p.m. Monday - Friday. Please note that voicemails left after 4:00 p.m. may not be returned until the following business day.  We are closed weekends and major holidays. You have access to a nurse at all times for urgent questions. Please call the main number to the clinic Dept: [phone] and follow the prompts.   For any non-urgent questions, you may also contact your provider using MyChart. We now offer e-Visits for anyone 58 and older to request care online for non-urgent symptoms. For details visit [email or web address].   Also download the MyChart app! Go to the app store, search "MyChart", open the app, select [facility], and log in with your MyChart username and password.

## 2023-04-26 NOTE — Progress Notes (Signed)
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  Past medical history of GERD.  Patient receiving concurrent chemotherapy and radiation  Met with patient during infusion.  PEG and port planned for 1/31.  Reports that he can't really taste food and hurts to swallow.  Has been forcing self to eat.  Yesterday ate sausage, eggs, grits, drank 3 boost shakes, cereal and peanut butter and banana sandwich.  Drinking mostly water.     Medications: lidocaine, compazine, dexamethasone, pepcid, zofran   Labs: reviewed   Anthropometrics:   Height: 64 inches Weight: 153 lb 11.2 oz on 1/24 155 lb on 12/30 149 lb on 02/04/23 BMI: 26  1% weight loss in the last 3 weeks  Estimated Energy Needs  Kcals:2100-2450 Protein: 105-122 g Fluid: > 2.1 L   NUTRITION DIAGNOSIS: Predicted sub optimal energy intake related to cancer related treatment side effects as evidenced by receiving feeding tube    INTERVENTION:  Discussed soft, moist high calorie, high protein foods. Handout provided Recommend 350 calorie + shakes.  Written examples provided Will provide further instructions on feeding once tube is placed. Contact number provided   MONITORING, EVALUATION, GOAL: weight trends, intake   Next Visit: Monday, Feb 3rd during infusion  Shameika Speelman B. Freida Busman, RD, LDN Registered Dietitian [phone]

## 2023-04-26 NOTE — Progress Notes (Signed)
Oncology Nurse Navigator Documentation   Met with [name] to provide PEG education prior to his 04/28/23 PEG placement.  Using  PEG teaching device   and Teach Back, provided education for PEG use and care, including: hand hygiene, gravity bolus administration of daily water flushes and nutritional supplement, fluids and medications; care of tube insertion site including daily dressing change and cleaning; S&S of infection.   [name] correctly verbalized procedures for and provided correct return demonstration of gravity administration of water, dressing change and site care.  I provided written instructions for PEG flushing/dressing change in support of verbal instruction.   I provided/described contents of Start of Care Bolus Feeding Kit (2 60 cc syringes, 1 box 4x4 drainage sponges, 1 package mesh briefs, 1 roll paper tape, 1 case Osmolite 1.5).  He voiced understanding he is to start using Osmolite per guidance of Nutrition. He understands I will be available for ongoing PEG support. Provided barium sulfate prep which I obtained from [facility] and reviewed instructions.    Hedda Slade RN, BSN, OCN Head & Neck Oncology Nurse Navigator [facility] Phone # [phone]  Fax # [phone]

## 2023-04-27 ENCOUNTER — Ambulatory Visit: Payer: Medicare Other

## 2023-04-27 ENCOUNTER — Other Ambulatory Visit: Payer: Self-pay | Admitting: Radiology

## 2023-04-27 ENCOUNTER — Inpatient Hospital Stay: Payer: Medicare Other

## 2023-04-27 ENCOUNTER — Ambulatory Visit
Admission: RE | Admit: 2023-04-27 | Discharge: 2023-04-27 | Disposition: A | Discharge status: Home / Self Care | Payer: Medicare Other | Source: Ambulatory Visit | Attending: Radiation Oncology | Admitting: Radiation Oncology

## 2023-04-27 ENCOUNTER — Other Ambulatory Visit: Payer: Self-pay

## 2023-04-27 DIAGNOSIS — C321 Malignant neoplasm of supraglottis: Secondary | ICD-10-CM

## 2023-04-27 DIAGNOSIS — Z510 Encounter for antineoplastic radiation therapy: Secondary | ICD-10-CM | POA: Diagnosis not present

## 2023-04-27 LAB — RAD ONC ARIA SESSION SUMMARY
Course Elapsed Days: 13
Plan Fractions Treated to Date: 10
Plan Prescribed Dose Per Fraction: 2 Gy
Plan Total Fractions Prescribed: 35
Plan Total Prescribed Dose: 70 Gy
Reference Point Dosage Given to Date: 20 Gy
Reference Point Session Dosage Given: 2 Gy
Session Number: 10

## 2023-04-27 NOTE — Progress Notes (Signed)
[facility] Clinical Social Work  Initial Assessment   [name] is a 53 y.o. year old male contacted by phone. Clinical Social Work was referred by new patient protocol for assessment of psychosocial needs.   SDOH (Social Determinants of Health) assessments performed: No SDOH Interventions    Flowsheet Row Office Visit from 03/29/2023 in [facility]  SDOH Interventions   Depression Interventions/Treatment  Medication       SDOH Screenings   Food Insecurity: No Food Insecurity (03/26/2023)  Housing: Unknown (03/26/2023)  Transportation Needs: No Transportation Needs (03/26/2023)  Utilities: Not At Risk (03/26/2023)  Alcohol Screen: Medium Risk (06/23/2017)  Depression (PHQ2-9): High Risk (03/29/2023)  Tobacco Use: Medium Risk (04/23/2023)     Distress Screen completed: No     No data to display            Family/Social Information:  Housing Arrangement: patient lives with his spouse. Family members/support persons in your life? Family and Friends Transportation concerns: no, patient is being uses a Clinical research associate,  patient's spouse will serve as back up when needed. Employment: Disabled.  Income source: Secretary/administrator concerns: Yes, current concerns Type of concern: Utilities, Rent/ mortgage, Medical bills, and Food Food access concerns: no Religious or spiritual practice: Not known Advanced directives: No.  Services Currently in place:  Insurance, Supportive Family, Income  Coping/ Adjustment to diagnosis: Patient understands treatment plan and what happens next? yes Concerns about diagnosis and/or treatment: Losing my job and/or losing income, Overwhelmed by information, Afraid of cancer, and family hx of cancer. Patient reported stressors: Depression, Anxiety/ nervousness, and Adjusting to my illness Hopes and/or priorities: Get through treatment Patient enjoys being outside Current coping skills/ strengths:  Ability for insight , Active sense of humor , Average or above average intelligence , Capable of independent living , Communication skills , General fund of knowledge , Motivation for treatment/growth , Religious Affiliation , Special hobby/interest , and Supportive family/friends     SUMMARY: Current SDOH Barriers:  Financial constraints related to Medical Bills and Housing Cost and Mood  Clinical Social Work Clinical Goal(s):  Explore community resource options for unmet needs related to:  Financial Strain  and Depression    Interventions: Discussed common feeling and emotions when being diagnosed with cancer, and the importance of support during treatment Informed patient of the support team roles and support services at [facility] Provided CSW contact information and encouraged patient to call with any questions or concerns Provided Education on Advance Directives and how to complete them.  Provided Emotional Support regarding mood. Completed [facility]   Follow Up Plan: Patient will contact CSW with any support or resource needs and CSW will see patient on 05/03/2023 during infusion. Patient verbalizes understanding of plan: Yes    Marguerita Merles, LCSW Clinical Social Worker [facility]

## 2023-04-27 NOTE — H&P (Signed)
Chief Complaint: Squamous cell carcinoma of the  supraglottis; referred for port a cath/gastrostomy tube placements  Referring Physician(s): Pasam,Avinash  Supervising Physician: Dr. Corlis Leak   Patient Status: [facility]  History of Present Illness: [name] is a 53 y.o. male with a history of tobacco use, GAD, MDD, chronic Hep C, GERD, and HTN presenting due to a recent diagnosis of squamous cell carcinoma of the supraglottis in December 2024. He began radiation treatments on 04/14/23 and chemotherapy treatments on 04/19/23. Currently tolerating chemotherapy well without any significant side effects. He started his second cycle of chemotherapy on 04/26/23. Referred now to IR for port-a-cath placement and g-tube placements.     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   Substance induced mood disorder (HCC) 11/05/2016    Past Surgical History:  Procedure Laterality Date   ANEURYSM COILING     AGE 3 x2   APPENDECTOMY     BIOPSY  01/05/2017   Procedure: BIOPSY;  Surgeon: West Bali, MD;  [facility]: [facility];  Service: Endoscopy;;  Duodenal and Gastric   COLONOSCOPY WITH PROPOFOL N/A 01/05/2017   Procedure: COLONOSCOPY WITH PROPOFOL;  Surgeon: West Bali, MD;  Location: [facility];  Service: Endoscopy;  Laterality: N/A;  10:00am   DIRECT LARYNGOSCOPY Left 03/16/2023   Procedure: Direct laryngoscopy with biopsy of supraglottic mass;  Surgeon: Newman Pies, MD;  Location: [facility];  Service: ENT;  Laterality: Left;   ESOPHAGOGASTRODUODENOSCOPY (EGD) WITH PROPOFOL N/A 01/05/2017   Procedure: ESOPHAGOGASTRODUODENOSCOPY (EGD) WITH  PROPOFOL;  Surgeon: West Bali, MD;  Location: [facility];  Service: Endoscopy;  Laterality: N/A;   LAPAROSCOPIC APPENDECTOMY     MASS EXCISION Right 01/13/2017   Procedure: EXCISION LIPOMA RIGHT ARM;  Surgeon: Lucretia Roers, MD;  Location: [facility];  Service: General;  Laterality: Right;    Allergies: Patient has no known allergies.  Medications: Prior to Admission medications   Medication Sig Start Date End Date Taking? Authorizing Provider  amlodipine-olmesartan (AZOR) 10-20 MG tablet Take 1 tablet by mouth daily. 02/04/23   [name], MD  dexamethasone (DECADRON) 4 MG tablet Take 2 tablets (8 mg) by mouth daily x 3 days starting the day after cisplatin chemotherapy. Take with food. 04/14/23   Pasam, Archie Patten, MD  famotidine (PEPCID) 40 MG tablet Take 1 tablet (40 mg total) by mouth daily. 02/04/23   [name], MD  FLUoxetine (PROZAC) 20 MG capsule Take 1 capsule (20 mg total) by mouth daily. 03/29/23   [name], MD  fluticasone (FLONASE) 50 MCG/ACT nasal spray Place 1 spray into both nostrils 2 (two) times daily. 10/28/22   Particia Nearing, PA-C  hydrOXYzine (ATARAX) 25 MG tablet Take 1 tablet (25 mg total) by mouth 3 (three) times daily as needed for anxiety. 02/04/23   [name], MD  lidocaine (XYLOCAINE) 2 % solution Patient: Mix 1part 2% viscous lidocaine, 1part H20. Swallow 10mL of diluted mixture, before meals and at bedtime, up to QID 03/26/23  Lonie Peak, MD  lidocaine-prilocaine (EMLA) cream Apply to affected area once 04/14/23   Pasam, Avinash, MD  metoprolol succinate (TOPROL-XL) 25 MG 24 hr tablet Take 1 tablet (25 mg total) by mouth daily. 02/04/23   [name], MD  nicotine (NICODERM CQ - DOSED IN MG/24 HOURS) 14 mg/24hr patch Place 1 patch (14 mg total) onto the skin daily. Apply 21 mg patch daily x 6 wk, then 14mg  patch daily x 2 wk, then 7 mg patch daily x 2 wk 03/26/23   Lonie Peak, MD  nicotine (NICODERM CQ - DOSED IN  MG/24 HOURS) 21 mg/24hr patch Place 1 patch (21 mg total) onto the skin daily. Apply 21 mg patch daily x 6 wk, then 14mg  patch daily x 2 wk, then 7 mg patch daily x 2 wk 03/26/23   Lonie Peak, MD  nicotine (NICODERM CQ - DOSED IN MG/24 HR) 7 mg/24hr patch Place 1 patch (7 mg total) onto the skin daily. Apply 21 mg patch daily x 6 wk, then 14mg  patch daily x 2 wk, then 7 mg patch daily x 2 wk 03/26/23   Lonie Peak, MD  ondansetron (ZOFRAN) 8 MG tablet Take 1 tablet (8 mg total) by mouth every 8 (eight) hours as needed for nausea or vomiting. Start on the third day after cisplatin. 04/14/23   Pasam, Archie Patten, MD  prochlorperazine (COMPAZINE) 10 MG tablet Take 1 tablet (10 mg total) by mouth every 6 (six) hours as needed (Nausea or vomiting). 04/14/23   Pasam, Archie Patten, MD  QUEtiapine (SEROQUEL) 200 MG tablet Take 1 tablet (200 mg total) by mouth at bedtime. 05/02/23   Elsie Lincoln, MD  QUEtiapine (SEROQUEL) 50 MG tablet Take one pill nightly for 1 week. Then increase to 2 pills nightly for 1 week. Then increase to 3 pills nightly for one week. Then increase to 4 pills nightly for one week. 04/05/23   Elsie Lincoln, MD     Family History  Problem Relation Age of Onset   Cancer Mother        Lung   Stomach cancer Mother    Cancer Sister        Bone cancer   Bone cancer Sister 26   Cancer Brother        3 brothers with prostate cancer   Colon cancer Brother 71   Colon polyps Neg Hx     Social History   Socioeconomic History   Marital status: Married    Spouse name: Not on file   Number of children: Not on file   Years of education: Not on file   Highest education level: Not on file  Occupational History   Not on file  Tobacco Use   Smoking status: Former    Current packs/day: 0.25    Average packs/day: 0.3 packs/day for 25.0 years (6.3 ttl pk-yrs)    Types: Cigarettes   Smokeless tobacco: Never   Tobacco comments:    More than 2 packs/day as of 03/18/2023 but quit around the  new year  Vaping Use   Vaping status: Every Day   Substances: Nicotine  Substance and Sexual Activity   Alcohol use: Yes    Alcohol/week: 1.0 standard drink of alcohol    Types: 1 Cans of beer per week    Comment: 1 beer at a time when consuming but unclear how frequent.   Drug use: Not Currently    Types: "Crack" cocaine, Cocaine, Marijuana    Comment: last positive  in 2020.  Ssta used last 2 months ago.   Sexual activity: Yes    Birth control/protection: Condom  Other Topics Concern   Not on file  Social History Narrative   NOW ON SHORT TERM DISABILITY. 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   MARRIED-3 WITH WIFE AND 3 PREVIOUS.   Social Drivers of Corporate investment banker Strain: Not on file  Food Insecurity: No Food Insecurity (03/26/2023)   Hunger Vital Sign    Worried About Running Out of Food in the Last Year: Never true    Ran Out of Food in the Last Year: Never true  Transportation Needs: No Transportation Needs (03/26/2023)   PRAPARE - Administrator, Civil Service (Medical): No    Lack of Transportation (Non-Medical): No  Physical Activity: Not on file  Stress: Not on file  Social Connections: Not on file   ROS: denies fever,CP, abd pain, back pain,N/V or bleeding; he does have HA, occ dyspnea, cough, left ear pain, odynophagia   Vital Signs: Temp 99.6  BP 130/85  HR 75  R 18 O2 SATS 96% RA   PE: awake/alert; chest- CTA bilat; heart- RRR; abd-soft,+BS,NT; no LE edema    Imaging: No results found.  Labs:  CBC: Recent Labs    02/04/23 1354 04/16/23 1422 04/23/23 1410  WBC 6.6 6.9 4.8  HGB 14.5 14.5 14.4  HCT 43.5 43.8 42.8  PLT 215 229 200    COAGS: No results for input(s): "INR", "APTT" in the last 8760 hours.  BMP: Recent Labs    02/04/23 1354 04/05/23 0806 04/16/23 1422 04/23/23 1410  NA 140 138 140 140  K 4.7 4.1 4.3 4.5  CL 103 105 105 103  CO2 24 26 30  33*  GLUCOSE 75 101* 100* 82  BUN 10 10 9 13    CALCIUM 9.4 9.4 10.0 9.7  CREATININE 0.90 0.89 1.08 1.09  GFRNONAA  --  >60 >60 >60    LIVER FUNCTION TESTS: Recent Labs    02/04/23 1354 04/05/23 0806 04/16/23 1422  BILITOT 0.3 1.0 0.7  AST 19 21 15   ALT 14 13 9   ALKPHOS 67 60 56  PROT 7.3 7.8 7.8  ALBUMIN 4.4 4.4 4.4    TUMOR MARKERS: No results for input(s): "AFPTM", "CEA", "CA199", "CHROMGRNA" in the last 8760 hours.  Assessment and Plan:  53 y.o. male with a history of tobacco use, GAD, MDD, chronic Hep C, GERD, and HTN presenting due to a recent diagnosis of squamous cell carcinoma of the supraglottis in December 2024. He began radiation treatments on 04/14/23 and chemotherapy treatments on 04/19/23. Currently tolerating chemotherapy well without any significant side effects. He started his second cycle of chemotherapy on 04/26/23. Referred to IR for port-a-cath placement and g-tube placements.   Risks and benefits of image guided port-a-catheter placement was discussed with the patient including, but not limited to bleeding, infection, pneumothorax, or fibrin sheath development and need for additional procedures.  Risks and benefits image guided gastrostomy tube placement was discussed with the patient including, but not limited to the need for a barium enema during the procedure, bleeding, infection, peritonitis and/or damage to adjacent structures.  All of the patient's questions were answered, patient is agreeable to proceed.  Consent signed and in chart.     LABS PENDING  Thank you for this interesting consult. I greatly enjoyed meeting JAYRO MCMATH and look forward to participating in their care. A copy of this report  was sent to the requesting provider on this date.  Electronically Signed: Jama Flavors, PA-C/Kevin Marena Witts,PA-C 04/27/2023, 11:06 AM   I spent a total of  30 Minutes   in face to face clinical consultation, greater than 50% of which was counseling/coordinating care for port-a-cath and  gastrostomy tube placements.

## 2023-04-28 ENCOUNTER — Encounter (HOSPITAL_COMMUNITY): Payer: Self-pay

## 2023-04-28 ENCOUNTER — Ambulatory Visit (HOSPITAL_COMMUNITY)
Admission: RE | Admit: 2023-04-28 | Discharge: 2023-04-28 | Disposition: A | Discharge status: Home / Self Care | Payer: Medicare Other | Source: Ambulatory Visit | Attending: Oncology

## 2023-04-28 ENCOUNTER — Encounter: Payer: Self-pay | Admitting: Oncology

## 2023-04-28 ENCOUNTER — Ambulatory Visit
Admission: RE | Admit: 2023-04-28 | Discharge: 2023-04-28 | Disposition: A | Discharge status: Home / Self Care | Payer: Medicare Other | Source: Ambulatory Visit | Attending: Radiation Oncology | Admitting: Radiation Oncology

## 2023-04-28 ENCOUNTER — Other Ambulatory Visit (HOSPITAL_COMMUNITY): Payer: Self-pay

## 2023-04-28 ENCOUNTER — Ambulatory Visit (HOSPITAL_COMMUNITY)
Admission: RE | Admit: 2023-04-28 | Discharge: 2023-04-28 | Disposition: A | Discharge status: Home / Self Care | Payer: Medicare Other | Source: Ambulatory Visit | Attending: Radiation Oncology | Admitting: Radiation Oncology

## 2023-04-28 ENCOUNTER — Ambulatory Visit (HOSPITAL_COMMUNITY)
Admission: RE | Admit: 2023-04-28 | Discharge: 2023-04-28 | Disposition: A | Discharge status: Home / Self Care | Payer: Medicare Other | Source: Ambulatory Visit | Attending: Radiation Oncology

## 2023-04-28 ENCOUNTER — Ambulatory Visit (HOSPITAL_COMMUNITY)
Admission: RE | Admit: 2023-04-28 | Discharge: 2023-04-28 | Disposition: A | Discharge status: Home / Self Care | Payer: Medicare Other | Source: Ambulatory Visit | Attending: Oncology | Admitting: Oncology

## 2023-04-28 ENCOUNTER — Other Ambulatory Visit: Payer: Self-pay

## 2023-04-28 DIAGNOSIS — R059 Cough, unspecified: Secondary | ICD-10-CM | POA: Insufficient documentation

## 2023-04-28 DIAGNOSIS — B182 Chronic viral hepatitis C: Secondary | ICD-10-CM | POA: Diagnosis not present

## 2023-04-28 DIAGNOSIS — Z87891 Personal history of nicotine dependence: Secondary | ICD-10-CM | POA: Insufficient documentation

## 2023-04-28 DIAGNOSIS — C321 Malignant neoplasm of supraglottis: Secondary | ICD-10-CM | POA: Diagnosis present

## 2023-04-28 DIAGNOSIS — R1310 Dysphagia, unspecified: Secondary | ICD-10-CM

## 2023-04-28 DIAGNOSIS — K219 Gastro-esophageal reflux disease without esophagitis: Secondary | ICD-10-CM | POA: Insufficient documentation

## 2023-04-28 DIAGNOSIS — F411 Generalized anxiety disorder: Secondary | ICD-10-CM | POA: Insufficient documentation

## 2023-04-28 DIAGNOSIS — I10 Essential (primary) hypertension: Secondary | ICD-10-CM | POA: Insufficient documentation

## 2023-04-28 DIAGNOSIS — Z510 Encounter for antineoplastic radiation therapy: Secondary | ICD-10-CM | POA: Diagnosis not present

## 2023-04-28 HISTORY — PX: IR GASTROSTOMY TUBE MOD SED: IMG625

## 2023-04-28 HISTORY — PX: IR IMAGING GUIDED PORT INSERTION: IMG5740

## 2023-04-28 LAB — CBC WITH DIFFERENTIAL/PLATELET
Abs Immature Granulocytes: 0.02 10*3/uL (ref 0.00–0.07)
Basophils Absolute: 0 10*3/uL (ref 0.0–0.1)
Basophils Relative: 1 %
Eosinophils Absolute: 0.1 10*3/uL (ref 0.0–0.5)
Eosinophils Relative: 2 %
HCT: 42 % (ref 39.0–52.0)
Hemoglobin: 13.5 g/dL (ref 13.0–17.0)
Immature Granulocytes: 1 %
Lymphocytes Relative: 14 %
Lymphs Abs: 0.6 10*3/uL — ABNORMAL LOW (ref 0.7–4.0)
MCH: 29.5 pg (ref 26.0–34.0)
MCHC: 32.1 g/dL (ref 30.0–36.0)
MCV: 91.9 fL (ref 80.0–100.0)
Monocytes Absolute: 1.1 10*3/uL — ABNORMAL HIGH (ref 0.1–1.0)
Monocytes Relative: 25 %
Neutro Abs: 2.6 10*3/uL (ref 1.7–7.7)
Neutrophils Relative %: 57 %
Platelets: 156 10*3/uL (ref 150–400)
RBC: 4.57 MIL/uL (ref 4.22–5.81)
RDW: 13.9 % (ref 11.5–15.5)
WBC: 4.4 10*3/uL (ref 4.0–10.5)
nRBC: 0 % (ref 0.0–0.2)

## 2023-04-28 LAB — RAD ONC ARIA SESSION SUMMARY
Course Elapsed Days: 14
Plan Fractions Treated to Date: 11
Plan Prescribed Dose Per Fraction: 2 Gy
Plan Total Fractions Prescribed: 35
Plan Total Prescribed Dose: 70 Gy
Reference Point Dosage Given to Date: 22 Gy
Reference Point Session Dosage Given: 2 Gy
Session Number: 11

## 2023-04-28 LAB — BASIC METABOLIC PANEL
Anion gap: 11 (ref 5–15)
BUN: 13 mg/dL (ref 6–20)
CO2: 26 mmol/L (ref 22–32)
Calcium: 9.1 mg/dL (ref 8.9–10.3)
Chloride: 102 mmol/L (ref 98–111)
Creatinine, Ser: 0.87 mg/dL (ref 0.61–1.24)
GFR, Estimated: >60 mL/min (ref >60)
Glucose, Bld: 92 mg/dL (ref 70–99)
Potassium: 4 mmol/L (ref 3.5–5.1)
Sodium: 139 mmol/L (ref 135–145)

## 2023-04-28 LAB — PROTIME-INR
INR: 0.9 (ref 0.8–1.2)
Prothrombin Time: 12.7 s (ref 11.4–15.2)

## 2023-04-28 MED ORDER — FENTANYL CITRATE (PF) 100 MCG/2ML IJ SOLN
INTRAMUSCULAR | Status: AC | PRN
  Administered 2023-04-28 (×2): 50 ug via INTRAVENOUS

## 2023-04-28 MED ORDER — HEPARIN SOD (PORK) LOCK FLUSH 100 UNIT/ML IV SOLN
500.0000 [IU] | Freq: Once | INTRAVENOUS | Status: AC
  Administered 2023-04-28: 500 [IU] via INTRAVENOUS

## 2023-04-28 MED ORDER — ONDANSETRON HCL 4 MG/2ML IJ SOLN: 4.0000 mg | INTRAMUSCULAR | Status: DC | PRN

## 2023-04-28 MED ORDER — SODIUM CHLORIDE 0.9 % IV SOLN: INTRAVENOUS | Status: DC

## 2023-04-28 MED ORDER — FENTANYL CITRATE (PF) 100 MCG/2ML IJ SOLN
INTRAMUSCULAR | Status: AC | PRN
  Administered 2023-04-28: 25 ug via INTRAVENOUS

## 2023-04-28 MED ORDER — GLUCAGON HCL RDNA (DIAGNOSTIC) 1 MG IJ SOLR
INTRAMUSCULAR | Status: AC | PRN
  Administered 2023-04-28: .5 mg via INTRAVENOUS

## 2023-04-28 MED ORDER — LIDOCAINE-EPINEPHRINE 1 %-1:100000 IJ SOLN
INTRAMUSCULAR | Status: AC
Start: 2023-04-28 — End: ?
  Filled 2023-04-28: qty 2

## 2023-04-28 MED ORDER — CEFAZOLIN SODIUM-DEXTROSE 2-4 GM/100ML-% IV SOLN
INTRAVENOUS | Status: AC
Start: 2023-04-28 — End: ?
  Filled 2023-04-28: qty 100

## 2023-04-28 MED ORDER — MIDAZOLAM HCL 2 MG/2ML IJ SOLN
INTRAMUSCULAR | Status: AC | PRN
  Administered 2023-04-28: .5 mg via INTRAVENOUS

## 2023-04-28 MED ORDER — HYDROCODONE-ACETAMINOPHEN 5-325 MG PO TABS
ORAL_TABLET | ORAL | Status: AC
  Filled 2023-04-28: qty 1

## 2023-04-28 MED ORDER — LIDOCAINE VISCOUS HCL 2 % MT SOLN
OROMUCOSAL | Status: AC
Start: 2023-04-28 — End: ?
  Filled 2023-04-28: qty 15

## 2023-04-28 MED ORDER — HYDROCODONE-ACETAMINOPHEN 5-325 MG PO TABS
1.0000 | ORAL_TABLET | ORAL | Status: DC | PRN
  Administered 2023-04-28: 1 via ORAL

## 2023-04-28 MED ORDER — MIDAZOLAM HCL 2 MG/2ML IJ SOLN
INTRAMUSCULAR | Status: AC | PRN
  Administered 2023-04-28: .5 mg via INTRAVENOUS
  Administered 2023-04-28: 1 mg via INTRAVENOUS
  Administered 2023-04-28: .5 mg via INTRAVENOUS

## 2023-04-28 MED ORDER — HYDROCODONE-ACETAMINOPHEN 5-325 MG PO TABS
1.0000 | ORAL_TABLET | Freq: Four times a day (QID) | ORAL | 0 refills | Status: DC | PRN
  Filled 2023-04-28: qty 10, 2d supply, fill #0

## 2023-04-28 MED ORDER — LIDOCAINE-EPINEPHRINE 1 %-1:100000 IJ SOLN
20.0000 mL | Freq: Once | INTRAMUSCULAR | Status: AC
  Administered 2023-04-28: 20 mL via INTRADERMAL

## 2023-04-28 MED ORDER — FENTANYL CITRATE (PF) 100 MCG/2ML IJ SOLN
INTRAMUSCULAR | Status: AC
  Filled 2023-04-28: qty 4

## 2023-04-28 MED ORDER — CEFAZOLIN SODIUM-DEXTROSE 2-4 GM/100ML-% IV SOLN
INTRAVENOUS | Status: AC | PRN
  Administered 2023-04-28: 2 g via INTRAVENOUS

## 2023-04-28 MED ORDER — HEPARIN SOD (PORK) LOCK FLUSH 100 UNIT/ML IV SOLN
INTRAVENOUS | Status: AC
Start: 2023-04-28 — End: ?
  Filled 2023-04-28: qty 5

## 2023-04-28 MED ORDER — HYDROMORPHONE HCL 1 MG/ML IJ SOLN: 1.0000 mg | INTRAMUSCULAR | Status: DC | PRN

## 2023-04-28 MED ORDER — GLUCAGON HCL RDNA (DIAGNOSTIC) 1 MG IJ SOLR
INTRAMUSCULAR | Status: AC
  Filled 2023-04-28: qty 1

## 2023-04-28 MED ORDER — CEFAZOLIN SODIUM-DEXTROSE 2-4 GM/100ML-% IV SOLN
2.0000 g | INTRAVENOUS | Status: DC
Start: 2023-04-28 — End: 2023-04-29

## 2023-04-28 MED ORDER — MIDAZOLAM HCL 2 MG/2ML IJ SOLN
INTRAMUSCULAR | Status: AC
  Filled 2023-04-28: qty 4

## 2023-04-28 NOTE — Discharge Instructions (Addendum)
Discharge Instructions:   Please call Interventional Radiology clinic [phone] with any questions or concerns.  You may remove your dressing and shower tomorrow.  Do not use EMLA / Lidocaine cream for 2 weeks post Port Insertion this will remove the surgical glue.   Moderate Conscious Sedation, Adult, Care After This sheet gives you information about how to care for yourself after your procedure. Your health care provider may also give you more specific instructions. If you have problems or questions, contact your health care provider. What can I expect after the procedure? After the procedure, it is common to have: Sleepiness for several hours. Impaired judgment for several hours. Difficulty with balance. Vomiting if you eat too soon. Follow these instructions at home: For the time period you were told by your health care provider: Rest. Do not participate in activities where you could fall or become injured. Do not drive or use machinery. Do not drink alcohol. Do not take sleeping pills or medicines that cause drowsiness. Do not make important decisions or sign legal documents. Do not take care of children on your own. Eating and drinking  Follow the diet recommended by your health care provider. Drink enough fluid to keep your urine pale yellow. If you vomit: Drink water, juice, or soup when you can drink without vomiting. Make sure you have little or no nausea before eating solid foods. General instructions Take over-the-counter and prescription medicines only as told by your health care provider. Have a responsible adult stay with you for the time you are told. It is important to have someone help care for you until you are awake and alert. Do not smoke. Keep all follow-up visits as told by your health care provider. This is important. Contact a health care provider if: You are still sleepy or having trouble with balance after 24 hours. You feel light-headed. You  keep feeling nauseous or you keep vomiting. You develop a rash. You have a fever. You have redness or swelling around the IV site. Get help right away if: You have trouble breathing. You have new-onset confusion at home. Summary After the procedure, it is common to feel sleepy, have impaired judgment, or feel nauseous if you eat too soon. Rest after you get home. Know the things you should not do after the procedure. Follow the diet recommended by your health care provider and drink enough fluid to keep your urine pale yellow. Get help right away if you have trouble breathing or new-onset confusion at home. This information is not intended to replace advice given to you by your health care provider. Make sure you discuss any questions you have with your health care provider. Document Revised: 07/14/2019 Document Reviewed: 02/09/2019 Elsevier Patient Education  2023 Elsevier Inc.    Implanted [place] Insertion, Care After The following information offers guidance on how to care for yourself after your procedure. Your health care provider may also give you more specific instructions. If you have problems or questions, contact your health care provider. What can I expect after the procedure? After the procedure, it is common to have: Discomfort at the port insertion site. Bruising on the skin over the port. This should improve over 3-4 days. Follow these instructions at home: [facility] care After your port is placed, you will get a manufacturer's information card. The card has information about your port. Keep this card with you at all times. Take care of the port as told by your health care provider. Ask your health care provider  if you or a family member can get training for taking care of the port at home. A home health care nurse will be be available to help care for the port. Make sure to remember what type of port you have. Incision care     Follow instructions from your health care  provider about how to take care of your port insertion site. Make sure you: Wash your hands with soap and water for at least 20 seconds before and after you change your bandage (dressing). If soap and water are not available, use hand sanitizer. Change your dressing as told by your health care provider. Leave stitches (sutures), skin glue, or adhesive strips in place. These skin closures may need to stay in place for 2 weeks or longer. If adhesive strip edges start to loosen and curl up, you may trim the loose edges. Do not remove adhesive strips completely unless your health care provider tells you to do that. Check your port insertion site every day for signs of infection. Check for: Redness, swelling, or pain. Fluid or blood. Warmth. Pus or a bad smell. Activity Return to your normal activities as told by your health care provider. Ask your health care provider what activities are safe for you. You may have to avoid lifting. Ask your health care provider how much you can safely lift. General instructions Take over-the-counter and prescription medicines only as told by your health care provider. Do not take baths, swim, or use a hot tub until your health care provider approves. Ask your health care provider if you may take showers. You may only be allowed to take sponge baths. If you were given a sedative during the procedure, it can affect you for several hours. Do not drive or operate machinery until your health care provider says that it is safe. Wear a medical alert bracelet in case of an emergency. This will tell any health care providers that you have a port. Keep all follow-up visits. This is important. Contact a health care provider if: You cannot flush your port with saline as directed, or you cannot draw blood from the port. You have a fever or chills. You have redness, swelling, or pain around your port insertion site. You have fluid or blood coming from your port insertion  site. Your port insertion site feels warm to the touch. You have pus or a bad smell coming from the port insertion site. Get help right away if: You have chest pain or shortness of breath. You have bleeding from your port that you cannot control. These symptoms may be an emergency. Get help right away. Call 911. Do not wait to see if the symptoms will go away. Do not drive yourself to the hospital. Summary Take care of the port as told by your health care provider. Keep the manufacturer's information card with you at all times. Change your dressing as told by your health care provider. Contact a health care provider if you have a fever or chills or if you have redness, swelling, or pain around your port insertion site. Keep all follow-up visits. This information is not intended to replace advice given to you by your health care provider. Make sure you discuss any questions you have with your health care provider. Document Revised: 09/17/2020 Document Reviewed: 09/17/2020 Elsevier Patient Education  2023 Elsevier Inc.   Gastrostomy Tube Home Guide, Adult A gastrostomy tube, or G-tube, is a tube that is inserted through the abdomen into the stomach. The tube  is used to give feedings and medicines when a person cannot eat and drink enough on his or her own or take medicines by mouth.  Cleanse gastrostomy site with soap and water,dress with triple -antibiotic ointment, and cover with gauze for 7 days. Do not have scissors near the catheter. Cap the gastrostomy port. May start using the tube according to instructions from oncolgist. Keep head of bed elevated 30 degree or higher.   How to care for the insertion site Washing hands with soap and water.   Supplies needed: Saline solution or clean, warm water and soap. Saline solution is made of salt and water. Cotton swab or gauze. Pre-cut gauze bandage (dressing) and tape, if needed. Instructions Follow these steps daily to clean the  insertion site: Wash your hands with soap and water for at least 20 seconds. Remove the dressing (if there is one) that is between the person's skin and the tube. Check the area where the tube enters the skin. Check daily for problems such as: Redness, rash, or irritation. Swelling. Pus-like drainage. Extra skin growth. Moisten the cotton swab or gauze with the saline solution or with a soap-and-water mixture. Gently clean around the insertion site. Remove any drainage or crusted material. When the G-tube is first put in, a normal saline solution or water can be used to clean the skin. After the skin around the tube has healed, mild soap and water may be used. Apply a dressing (if there should be one) between the person's skin and the tube. How to flush a G-tube Flush the G-tube regularly to keep it from clogging. Flush it before and after feedings and as often as told by the health care provider. Supplies needed: Purified or germ-free (sterile) water, warmed. Container with lid for boiling water, if needed. 60 cc G-tube syringe. Instructions Before you begin, decide whether to use sterile water or purified drinking water. Use only sterile water if: The person has a weak disease-fighting (immune) system. The person has trouble fighting off infections (is immunocompromised). You are unsure about the amount of chemical contaminants in purified or drinking water. Use purified drinking water in all other cases. To purify drinking water by boiling: Boil water for at least 1 minute. Keep lid over water while it boils. Let water cool to room temperature before using. Follow these steps to flush the G-tube: Wash your hands with soap and water for at least 20 seconds. Bring out (draw up) 30 mL of warm water in a syringe. Connect the syringe to the tube. Slowly and gently push the water into the tube. General tips If the tube comes out: Cover the opening with a clean dressing and tape. Get  help right away. If there is skin or scar tissue growing where the tube enters the skin: Keep the area clean and dry. Secure the tube with tape so that the tube does not move around too much. If the tube gets clogged: Slowly push warm water into the tube with a large syringe. Do not force the fluid into the tube or push an object into the tube. Get help right away if you cannot unclog the tube. Follow these instructions at home: Feedings Give feedings at room temperature. If feedings are continuous: Do not put more than 4 hours' worth of feedings in the feeding bag. Stop the feedings when you need to give medicine or flush the tube. Be sure to restart the feedings. Make sure the person's head is above his or her stomach (  upright position). This will prevent choking and discomfort. Make sure the person is in the right position during and after feedings. During feedings, have the person in the upright position. After a non-continuous feeding (bolus feeding), have the person stay in the upright position for 1 hour. Cover and place unused feedings in the refrigerator. Replace feeding bags and syringes as told. Good hygiene Make sure the person takes good care of his or her mouth and teeth (oral hygiene), such as by brushing his or her teeth. Keep the area where the tube enters the skin clean and dry. General instructions Do not pull or put tension on the tube. Before you remove the tube cap or disconnect a syringe, close the tube by using a clamp (clamping) or bending (kinking) the tube. Measure the length of the G-tube every day from the insertion site to the end of the tube. If the person's G-tube has a balloon, check the fluid in the balloon every week. Check the manufacturer's specifications to find the amount of fluid that should be in the balloon. Remove excess air from the G-tube as told. This is called venting. Do not push feedings, medicines, or flushes fast. Use the feeding tube  equipment, such as syringes and connectors, only as told by your health care provider. Contact a health care provider if: The person with the tube has constipation or a fever. A large amount of fluid or mucus-like liquid is leaking from the tube. Skin or scar tissue appears to be growing where the tube enters the skin. The length of tube from the insertion site to the G-tube gets longer. Get help right away if: The person with the tube has any of these problems: Severe pain, tenderness, or bloating in the abdomen. Nausea or vomiting. Trouble breathing or shortness of breath. Any of these problems happen in the area where the tube enters the skin: Redness, irritation, swelling, or soreness. Pus-like discharge. A bad smell. The tube is clogged and cannot be flushed. The tube comes out. The tube will need to be put back in within 4 hours. Summary A gastrostomy tube, or G-tube, is a tube that is inserted through the abdomen into the stomach. The tube is used to give feedings and medicines when a person cannot eat and drink enough on his or her own or cannot take medicine by mouth. Check and clean the insertion site daily as told by the person's health care provider. Flush the G-tube regularly to keep it from clogging. Flush it before and after feedings and as often as told. Keep the area where the tube enters the skin clean and dry. This information is not intended to replace advice given to you by your health care provider. Make sure you discuss any questions you have with your health care provider. Document Revised: 03/06/2020 Document Reviewed: 08/03/2019 Elsevier Patient Education  2023 [employer].

## 2023-04-28 NOTE — Sedation Documentation (Signed)
Pt remains on procedure table for 2nd part of procedure. Port complete, now to proceed with G tube insertion.

## 2023-04-28 NOTE — Procedures (Signed)
  Procedure:  1. R internal jugular port catheter 2. 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 Postprocedure diagnosis: same EBL:    minimal Complications:   none immediate  See full dictation in [employer].  Thora Lance MD Main # [phone] Pager  [phone] Mobile [phone]

## 2023-04-28 NOTE — Sedation Documentation (Signed)
Port prcoedure complete, prep for g tube

## 2023-04-28 NOTE — Progress Notes (Signed)
Ice bag given to use for comfort to right upper neck and right upper chest.

## 2023-04-28 NOTE — Progress Notes (Signed)
Patient ID: [name], male   DOB: 1970-06-17, 53 y.o.   MRN: [id number] Per order of Dr. Deanne Coffer, electronic prescription for vicodin ,#10, no refills, 1-2 tabs every 6 hours prn moderate pain was sent  to [facility] pharmacy. Pt s/p port a cath and gastrostomy tube placements this am.

## 2023-04-28 NOTE — Sedation Documentation (Signed)
MD in room, did not leave room.

## 2023-04-29 ENCOUNTER — Other Ambulatory Visit: Payer: Self-pay | Admitting: Oncology

## 2023-04-29 ENCOUNTER — Other Ambulatory Visit: Payer: Self-pay

## 2023-04-29 ENCOUNTER — Ambulatory Visit
Admission: RE | Admit: 2023-04-29 | Discharge: 2023-04-29 | Disposition: A | Discharge status: Home / Self Care | Payer: Medicare Other | Source: Ambulatory Visit | Attending: Radiation Oncology | Admitting: Radiation Oncology

## 2023-04-29 DIAGNOSIS — Z510 Encounter for antineoplastic radiation therapy: Secondary | ICD-10-CM | POA: Diagnosis not present

## 2023-04-29 DIAGNOSIS — C321 Malignant neoplasm of supraglottis: Secondary | ICD-10-CM

## 2023-04-29 LAB — RAD ONC ARIA SESSION SUMMARY
Course Elapsed Days: 15
Plan Fractions Treated to Date: 12
Plan Prescribed Dose Per Fraction: 2 Gy
Plan Total Fractions Prescribed: 35
Plan Total Prescribed Dose: 70 Gy
Reference Point Dosage Given to Date: 24 Gy
Reference Point Session Dosage Given: 2 Gy
Session Number: 12

## 2023-04-30 ENCOUNTER — Ambulatory Visit: Payer: Medicare Other | Admitting: Oncology

## 2023-04-30 ENCOUNTER — Inpatient Hospital Stay (HOSPITAL_BASED_OUTPATIENT_CLINIC_OR_DEPARTMENT_OTHER): Payer: Medicare Other | Admitting: Oncology

## 2023-04-30 ENCOUNTER — Encounter: Payer: Self-pay | Admitting: Oncology

## 2023-04-30 ENCOUNTER — Inpatient Hospital Stay: Payer: Medicare Other

## 2023-04-30 ENCOUNTER — Other Ambulatory Visit: Payer: Self-pay

## 2023-04-30 ENCOUNTER — Ambulatory Visit
Admission: RE | Admit: 2023-04-30 | Discharge: 2023-04-30 | Disposition: A | Discharge status: Home / Self Care | Payer: Medicare Other | Source: Ambulatory Visit | Attending: Radiation Oncology | Admitting: Radiation Oncology

## 2023-04-30 VITALS — BP 110/71 | HR 64 | Temp 97.9°F | Resp 17 | Wt 148.7 lb

## 2023-04-30 DIAGNOSIS — T451X5A Adverse effect of antineoplastic and immunosuppressive drugs, initial encounter: Secondary | ICD-10-CM | POA: Diagnosis not present

## 2023-04-30 DIAGNOSIS — C321 Malignant neoplasm of supraglottis: Secondary | ICD-10-CM

## 2023-04-30 DIAGNOSIS — D6959 Other secondary thrombocytopenia: Secondary | ICD-10-CM | POA: Diagnosis not present

## 2023-04-30 DIAGNOSIS — Z510 Encounter for antineoplastic radiation therapy: Secondary | ICD-10-CM | POA: Diagnosis not present

## 2023-04-30 DIAGNOSIS — Z95828 Presence of other vascular implants and grafts: Secondary | ICD-10-CM | POA: Insufficient documentation

## 2023-04-30 LAB — RAD ONC ARIA SESSION SUMMARY
Course Elapsed Days: 16
Plan Fractions Treated to Date: 13
Plan Prescribed Dose Per Fraction: 2 Gy
Plan Total Fractions Prescribed: 35
Plan Total Prescribed Dose: 70 Gy
Reference Point Dosage Given to Date: 26 Gy
Reference Point Session Dosage Given: 2 Gy
Session Number: 13

## 2023-04-30 LAB — CBC WITH DIFFERENTIAL (CANCER CENTER ONLY)
Abs Immature Granulocytes: 0.02 10*3/uL (ref 0.00–0.07)
Basophils Absolute: 0 10*3/uL (ref 0.0–0.1)
Basophils Relative: 0 %
Eosinophils Absolute: 0.1 10*3/uL (ref 0.0–0.5)
Eosinophils Relative: 2 %
HCT: 39.2 % (ref 39.0–52.0)
Hemoglobin: 13 g/dL (ref 13.0–17.0)
Immature Granulocytes: 0 %
Lymphocytes Relative: 11 %
Lymphs Abs: 0.7 10*3/uL (ref 0.7–4.0)
MCH: 29.1 pg (ref 26.0–34.0)
MCHC: 33.2 g/dL (ref 30.0–36.0)
MCV: 87.7 fL (ref 80.0–100.0)
Monocytes Absolute: 0.7 10*3/uL (ref 0.1–1.0)
Monocytes Relative: 10 %
Neutro Abs: 5.4 10*3/uL (ref 1.7–7.7)
Neutrophils Relative %: 77 %
Platelet Count: 122 10*3/uL — ABNORMAL LOW (ref 150–400)
RBC: 4.47 MIL/uL (ref 4.22–5.81)
RDW: 13.3 % (ref 11.5–15.5)
WBC Count: 7 10*3/uL (ref 4.0–10.5)
nRBC: 0 % (ref 0.0–0.2)

## 2023-04-30 LAB — BASIC METABOLIC PANEL - CANCER CENTER ONLY
Anion gap: 7 (ref 5–15)
BUN: 15 mg/dL (ref 6–20)
CO2: 29 mmol/L (ref 22–32)
Calcium: 9 mg/dL (ref 8.9–10.3)
Chloride: 101 mmol/L (ref 98–111)
Creatinine: 1.08 mg/dL (ref 0.61–1.24)
GFR, Estimated: >60 mL/min (ref >60)
Glucose, Bld: 108 mg/dL — ABNORMAL HIGH (ref 70–99)
Potassium: 4 mmol/L (ref 3.5–5.1)
Sodium: 137 mmol/L (ref 135–145)

## 2023-04-30 LAB — MAGNESIUM: Magnesium: 2.1 mg/dL (ref 1.7–2.4)

## 2023-04-30 MED ORDER — SODIUM CHLORIDE 0.9% FLUSH
10.0000 mL | Freq: Once | INTRAVENOUS | Status: AC
  Administered 2023-04-30: 10 mL

## 2023-04-30 MED ORDER — HEPARIN SOD (PORK) LOCK FLUSH 100 UNIT/ML IV SOLN
500.0000 [IU] | Freq: Once | INTRAVENOUS | Status: AC
  Administered 2023-04-30: 500 [IU]

## 2023-04-30 MED FILL — Fosaprepitant Dimeglumine For IV Infusion 150 MG (Base Eq): INTRAVENOUS | Qty: 5 | Status: AC

## 2023-04-30 NOTE — Assessment & Plan Note (Signed)
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   Discussed his case in our tumor conference on 04/14/2023.  Based on imaging studies, it is classified as cT3 lesion.  cN1.  Hence plan is to proceed with concurrent chemoradiation.  We have discussed about role of cisplatin being a radiosensitizer in the treatment of head and neck cancer.  We have discussed about the curative intent of chemoradiation for this patient.     -He began radiation treatments from 04/14/2023.  Began chemotherapy with cisplatin from 04/19/2023.  He has been tolerating chemoradiation well without any noticeable side effects.  -Labs today reveal no dose-limiting toxicities.  Will proceed with cisplatin weekly during the course of radiation.  He is scheduled for cycle 3 on 05/03/2023.  Cisplatin will be given at a dose of 40 mg/m weekly.   -His Port-A-Cath and feeding tube placement were placed on 04/28/2023.    -He has prescriptions available for nausea medicines.  Emphasized adequate hydration.  Patient verbalized understanding.  RTC in 1 week for labs, follow-up.

## 2023-04-30 NOTE — Assessment & Plan Note (Signed)
Platelet count is slightly decreased at 122,000 today.  We can still proceed with standard strength cisplatin dose.  Will continue to monitor platelet count and adjust dose as needed.

## 2023-04-30 NOTE — Progress Notes (Signed)
[facility]  ONCOLOGY CLINIC PROGRESS NOTE   Patient Care Team: [name], MD as PCP - General (Internal Medicine) West Bali, MD (Inactive) as Consulting Physician (Gastroenterology) Lonie Peak, MD as Attending Physician (Radiation Oncology)  PATIENT NAME: [name]   MR#: [id number] DOB: 01/21/1971  Date of visit: 04/30/2023   ASSESSMENT & PLAN:   [name] is a 53 y.o. 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   Malignant neoplasm of supraglottis (HCC) Supraglottic cancer with one suspicious 7 mm lymph node on PET scan, classified as T3, N1, M0, stage III, low volume disease.   Discussed his case in our tumor conference on 04/14/2023.  Based on imaging studies, it is classified as cT3 lesion.  cN1.  Hence plan is to proceed with concurrent chemoradiation.  We have discussed about role of cisplatin being a radiosensitizer in the treatment of head and neck cancer.  We have discussed about the curative intent of chemoradiation for this patient.     -He began radiation treatments from 04/14/2023.  Began chemotherapy with cisplatin from 04/19/2023.  He has been tolerating chemoradiation well without any noticeable side effects.  -Labs today reveal no dose-limiting toxicities.  Will proceed with cisplatin weekly during the course of radiation.  He is scheduled for cycle 3 on 05/03/2023.  Cisplatin will be given at a dose of 40 mg/m weekly.   -His Port-A-Cath and feeding tube placement were placed on 04/28/2023.    -He has prescriptions available for nausea medicines.  Emphasized adequate hydration.  Patient verbalized understanding.  RTC in 1 week for labs, follow-up.    Chemotherapy-induced thrombocytopenia Platelet count is slightly decreased at 122,000 today.  We can still  proceed with standard strength cisplatin dose.  Will continue to monitor platelet count and adjust dose as needed.    I reviewed lab results and outside records for this visit and discussed relevant results with the patient. Diagnosis, plan of care and treatment options were also discussed in detail with the patient. Opportunity provided to ask questions and answers provided to his apparent satisfaction. Provided instructions to call our clinic with any problems, questions or concerns prior to return visit. I recommended to continue follow-up with PCP and sub-specialists. He verbalized understanding and agreed with the plan.   NCCN guidelines have been consulted in the planning of this patient's care.  I spent a total of 30 minutes during this encounter with the patient including review of chart and various tests results, discussions about plan of care and coordination of care plan.   Meryl Crutch, MD  04/30/2023 4:07 PM  [facility] - A DEPT OF [facility] 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   Current Treatment: Concurrent chemoradiation with weekly cisplatin.  Radiation started from 04/14/2023.  Cisplatin started from 04/19/2023.  INTERVAL HISTORY:    Discussed the use of AI scribe software for clinical note transcription with the patient, who gave verbal consent to proceed.   [name] is here today for repeat clinical assessment.   He presents for a follow-up  after his first chemotherapy treatment. He reports that the treatment is going "pretty well" but notes that his voice strength "comes and goes." He has also lost his sense of taste, which has affected his appetite, but he continues to force himself to  eat. He has not yet had a port placed for chemotherapy administration, but it is scheduled for the following week. He reports a slight ringing in his ears but denies any nausea, vomiting, or tingling and numbness in the hands.  I have reviewed the past medical history, past surgical history, social history and family history with the patient and they are unchanged from previous note.  HISTORY OF PRESENT ILLNESS:   ONCOLOGY HISTORY:   He initially presented to the [facility] on 10/28/22 with c/o left ear pain, popping with chewing, and pain radiating down the side of his neck and throat x 1 month. He was treated for a left ear infection with a course of prednisone.   His symptoms including left ear and throat pain however persisted and progressed and he was subsequently referred to Dr. Suszanne Conners on 03/10/23 at [facility] for further evaluation. During which time, the patient detailed that his pain first began in the ear and eventually progressed to involve the left side of his throat.  A laryngoscopy was subsequently performed at that time which revealed an ulcerative lesion on the posterior aspect of the epiglottis, superior to the vocal cords. The arytenoid mucosa was also mildly edematous and the true vocal folds were pale yellow, mobile, and without mass or lesion.       He accordingly underwent a direct laryngoscopy with biopsies of the left supraglottic tumor/mass on 03/16/23. Pathology showed findings consistent with invasive and in situ moderately differentiated squamous cell carcinoma. Per note by Dr Suszanne Conners, "Examination of the vallecula, piriform sinuses, and the pharyngeal mucosa were all normal. A fungating mass was noted on the posterior aspect of the left epiglottis. The mass did not appear to involve the vocal cords."   Soft tissue neck CT and PET scan on 03/23/23 were done - demonstrating a left supraglottic lesion which may involve the glottis as well as a 7 mm left level 3 lymph  node that is suspicious for metastatic involvement.  No distant metastatic disease.  cT3,cN1,cM0, Stage III disease.   Plan made to proceed with concurrent chemoradiation with weekly cisplatin.  Oncology History  Malignant neoplasm of supraglottis (HCC)  03/22/2023 Initial Diagnosis   Malignant neoplasm of supraglottis (HCC)   03/26/2023 Cancer Staging   Staging form: Larynx - Supraglottis, AJCC 8th Edition - Clinical stage from 03/26/2023: Stage III (cT3, cN1, cM0) - Signed by Lonie Peak, MD on 04/14/2023 Stage prefix: Initial diagnosis   04/19/2023 -  Chemotherapy   Patient is on Treatment Plan : HEAD/NECK Cisplatin (40) q7d         REVIEW OF SYSTEMS:   Review of Systems - Oncology  All other pertinent systems were reviewed with the patient and are negative.  ALLERGIES: He has no known allergies.  MEDICATIONS:  Current Outpatient Medications  Medication Sig Dispense Refill   amlodipine-olmesartan (AZOR) 10-20 MG tablet Take 1 tablet by mouth daily. 30 tablet 1   dexamethasone (DECADRON) 4 MG tablet Take 2 tablets (8 mg) by mouth daily x 3 days starting the day after cisplatin chemotherapy. Take with food. 30 tablet 1   famotidine (PEPCID) 40 MG tablet Take 1 tablet (40 mg total) by mouth daily. 30 tablet 3   FLUoxetine (  PROZAC) 20 MG capsule Take 1 capsule (20 mg total) by mouth daily. 30 capsule 3   fluticasone (FLONASE) 50 MCG/ACT nasal spray Place 1 spray into both nostrils 2 (two) times daily. 16 g 2   HYDROcodone-acetaminophen (NORCO) 5-325 MG tablet Take 1-2 tablets by mouth every 6 (six) hours as needed for moderate pain (pain score 4-6). 10 tablet 0   hydrOXYzine (ATARAX) 25 MG tablet Take 1 tablet (25 mg total) by mouth 3 (three) times daily as needed for anxiety. 90 tablet 1   lidocaine (XYLOCAINE) 2 % solution Patient: Mix 1part 2% viscous lidocaine, 1part H20. Swallow 10mL of diluted mixture, before meals and at bedtime, up to QID 200 mL 3    lidocaine-prilocaine (EMLA) cream Apply to affected area once 30 g 3   metoprolol succinate (TOPROL-XL) 25 MG 24 hr tablet Take 1 tablet (25 mg total) by mouth daily. 30 tablet 1   nicotine (NICODERM CQ - DOSED IN MG/24 HOURS) 14 mg/24hr patch Place 1 patch (14 mg total) onto the skin daily. Apply 21 mg patch daily x 6 wk, then 14mg  patch daily x 2 wk, then 7 mg patch daily x 2 wk 14 patch 0   nicotine (NICODERM CQ - DOSED IN MG/24 HOURS) 21 mg/24hr patch Place 1 patch (21 mg total) onto the skin daily. Apply 21 mg patch daily x 6 wk, then 14mg  patch daily x 2 wk, then 7 mg patch daily x 2 wk 14 patch 2   nicotine (NICODERM CQ - DOSED IN MG/24 HR) 7 mg/24hr patch Place 1 patch (7 mg total) onto the skin daily. Apply 21 mg patch daily x 6 wk, then 14mg  patch daily x 2 wk, then 7 mg patch daily x 2 wk 14 patch 0   ondansetron (ZOFRAN) 8 MG tablet Take 1 tablet (8 mg total) by mouth every 8 (eight) hours as needed for nausea or vomiting. Start on the third day after cisplatin. 30 tablet 1   prochlorperazine (COMPAZINE) 10 MG tablet TAKE 1 TABLET(10 MG) BY MOUTH EVERY 6 HOURS AS NEEDED FOR NAUSEA OR VOMITING 30 tablet 1   [START ON 05/02/2023] QUEtiapine (SEROQUEL) 200 MG tablet Take 1 tablet (200 mg total) by mouth at bedtime. 30 tablet 2   QUEtiapine (SEROQUEL) 50 MG tablet Take one pill nightly for 1 week. Then increase to 2 pills nightly for 1 week. Then increase to 3 pills nightly for one week. Then increase to 4 pills nightly for one week. 90 tablet 0   No current facility-administered medications for this visit.     VITALS:   Blood pressure 110/71, pulse 64, temperature 97.9 F (36.6 C), temperature source Temporal, resp. rate 17, weight 148 lb 11.2 oz (67.4 kg), SpO2 97%.  Wt Readings from Last 3 Encounters:  04/30/23 148 lb 11.2 oz (67.4 kg)  04/28/23 152 lb 5.4 oz (69.1 kg)  04/26/23 152 lb 4 oz (69.1 kg)    Body mass index is 25.52 kg/m.   Onc Performance Status - 04/30/23 1547        ECOG Perf Status   ECOG Perf Status Fully active, able to carry on all pre-disease performance without restriction      KPS SCALE   KPS % SCORE Able to carry on normal activity, minor s/s of disease             PHYSICAL EXAM:   Physical Exam Constitutional:      General: He is not in acute distress.  Appearance: Normal appearance.  HENT:     Head: Normocephalic and atraumatic.  Eyes:     General: No scleral icterus.    Conjunctiva/sclera: Conjunctivae normal.  Cardiovascular:     Rate and Rhythm: Normal rate and regular rhythm.     Heart sounds: Normal heart sounds.  Pulmonary:     Effort: Pulmonary effort is normal.     Breath sounds: Normal breath sounds.  Chest:     Comments: Port-A-Cath in place without any signs of infection Abdominal:     General: There is no distension.     Comments: Feeding tube in place, bandaged.  Musculoskeletal:     Right lower leg: No edema.     Left lower leg: No edema.  Neurological:     General: No focal deficit present.     Mental Status: He is alert and oriented to person, place, and time.  Psychiatric:        Mood and Affect: Mood normal.        Behavior: Behavior normal.        Thought Content: Thought content normal.       LABORATORY DATA:   I have reviewed the data as listed.  Results for orders placed or performed in visit on 04/30/23  Rad Onc Aria Session Summary  Result Value Ref Range   Course ID [id number]    Course Start Date 04/05/2023    Session Number 13    Course First Treatment Date 04/14/2023  9:27 AM    Course Last Treatment Date 04/30/2023  3:10 PM    Course Elapsed Days 16    Reference Point ID HN    Reference Point Dosage Given to Date 26 Gy   Reference Point Session Dosage Given 2 Gy   Plan ID HN    Plan Fractions Treated to Date 13    Plan Total Fractions Prescribed 35    Plan Prescribed Dose Per Fraction 2 Gy   Plan Total Prescribed Dose 70.000000 Gy   Plan Primary Reference Point HN   Results  for orders placed or performed in visit on 04/30/23  Basic Metabolic Panel - Cancer Center Only  Result Value Ref Range   Sodium 137 135 - 145 mmol/L   Potassium 4.0 3.5 - 5.1 mmol/L   Chloride 101 98 - 111 mmol/L   CO2 29 22 - 32 mmol/L   Glucose, Bld 108 (H) 70 - 99 mg/dL   BUN 15 6 - 20 mg/dL   Creatinine 0.27 2.53 - 1.24 mg/dL   Calcium 9.0 8.9 - 66.4 mg/dL   GFR, Estimated >40 >34 mL/min   Anion gap 7 5 - 15  CBC with Differential (Cancer Center Only)  Result Value Ref Range   WBC Count 7.0 4.0 - 10.5 K/uL   RBC 4.47 4.22 - 5.81 MIL/uL   Hemoglobin 13.0 13.0 - 17.0 g/dL   HCT 74.2 59.5 - 63.8 %   MCV 87.7 80.0 - 100.0 fL   MCH 29.1 26.0 - 34.0 pg   MCHC 33.2 30.0 - 36.0 g/dL   RDW 75.6 43.3 - 29.5 %   Platelet Count 122 (L) 150 - 400 K/uL   nRBC 0.0 0.0 - 0.2 %   Neutrophils Relative % 77 %   Neutro Abs 5.4 1.7 - 7.7 K/uL   Lymphocytes Relative 11 %   Lymphs Abs 0.7 0.7 - 4.0 K/uL   Monocytes Relative 10 %   Monocytes Absolute 0.7 0.1 - 1.0 K/uL  Eosinophils Relative 2 %   Eosinophils Absolute 0.1 0.0 - 0.5 K/uL   Basophils Relative 0 %   Basophils Absolute 0.0 0.0 - 0.1 K/uL   Immature Granulocytes 0 %   Abs Immature Granulocytes 0.02 0.00 - 0.07 K/uL  Magnesium  Result Value Ref Range   Magnesium 2.1 1.7 - 2.4 mg/dL     RADIOGRAPHIC STUDIES:  I have personally reviewed the radiological images as listed and agree with the findings in the report.  IR GASTROSTOMY TUBE MOD SED Result Date: 04/28/2023 CLINICAL DATA:  Laryngeal carcinoma, needs gastrostomy for enteral feeding support EXAM: PERC PLACEMENT GASTROSTOMY FLUOROSCOPY: Radiation Exposure Index (as provided by the fluoroscopic device): 13 mGy air Kerma TECHNIQUE: The procedure, risks, benefits, and alternatives were explained to the patient. Questions regarding the procedure were encouraged and answered. The patient understands and consents to the procedure. As antibiotic prophylaxis, cefazolin 2 g was  ordered pre-procedure and administered intravenously within one hour of incision. Progression of previously administered oral barium into the colon was confirmed fluoroscopically. A 5 French angiographic catheter was placed as orogastric tube. The upper abdomen was prepped with Betadine, draped in usual sterile fashion, and infiltrated locally with 1% lidocaine. Intravenous Fentanyl and Versed 1mg  were administered by RN during a total moderate (conscious) sedation time of 10 minutes; the patient's level of consciousness and physiological / cardiorespiratory status were monitored continuously by radiology RN under my direct supervision. 0.5 mg glucagon given IV to facilitate gastric distention.Stomach was insufflated using air through the orogastric tube. An 72 French sheath needle was advanced percutaneously into the gastric lumen under fluoroscopy. Gas could be aspirated and a small contrast injection confirmed intraluminal spread. The sheath was exchanged over a guidewire for a 9 [place] vascular sheath, through which the snare device was advanced and used to snare a guidewire passed through the orogastric tube. This was withdrawn, and the snare attached to the 20 French pull-through gastrostomy tube, which was advanced antegrade, positioned with the internal bumper securing the anterior gastric wall to the anterior abdominal wall. Small contrast injection confirms appropriate positioning. The external bumper was applied and the catheter was flushed. COMPLICATIONS: COMPLICATIONS none IMPRESSION: 1. Technically successful 20 French pull-through gastrostomy placement under fluoroscopy. Electronically Signed   By: Corlis Leak M.D.   On: 04/28/2023 15:03   IR IMAGING GUIDED PORT INSERTION Result Date: 04/28/2023 CLINICAL DATA:  Laryngeal carcinoma, needs durable venous access for planned treatment regimen EXAM: TUNNELED PORT CATHETER PLACEMENT WITH ULTRASOUND AND FLUOROSCOPIC GUIDANCE FLUOROSCOPY: Radiation  Exposure Index (as provided by the fluoroscopic device): Less than 0.1 mGy air Kerma ANESTHESIA/SEDATION: Intravenous Fentanyl and Versed 2mg  were administered by RN during a total moderate (conscious) sedation time of 10 minutes; the patient's level of consciousness and physiological / cardiorespiratory status were monitored continuously by radiology RN under my direct supervision. TECHNIQUE: The procedure, risks, benefits, and alternatives were explained to the patient. Questions regarding the procedure were encouraged and answered. The patient understands and consents to the procedure. Patency of the right IJ vein was confirmed with ultrasound with image documentation. An appropriate skin site was determined. Skin site was marked. Region was prepped using maximum barrier technique including cap and mask, sterile gown, sterile gloves, large sterile sheet, and Chlorhexidine as cutaneous antisepsis. The region was infiltrated locally with 1% lidocaine. Under real-time ultrasound guidance, the right IJ vein was accessed with a 21 gauge micropuncture needle; the needle tip within the vein was confirmed with ultrasound image documentation. Needle  was exchanged over a 018 guidewire for transitional dilator, and vascular measurement was performed. A small incision was made on the right anterior chest wall and a subcutaneous pocket fashioned. The power-injectable port was positioned and its catheter tunneled to the right IJ dermatotomy site. The transitional dilator was exchanged over an Amplatz wire for a peel-away sheath, through which the port catheter, which had been trimmed to the appropriate length, was advanced and positioned under fluoroscopy with its tip at the cavoatrial junction. Spot chest radiograph confirms good catheter position and no pneumothorax. The port was flushed per protocol. The pocket was closed with deep interrupted and subcuticular continuous 3-0 Monocryl sutures. The incisions were  covered with Dermabond then covered with a sterile dressing. The patient tolerated the procedure well. COMPLICATIONS: COMPLICATIONS None immediate IMPRESSION: Technically successful right IJ power-injectable port catheter placement. Ready for routine use. Electronically Signed   By: Corlis Leak M.D.   On: 04/28/2023 15:01     CODE STATUS:  Code Status History     Date Active Date Inactive Code Status Order ID Comments User Context   06/23/2017 1540 06/28/2017 2102 Full Code [id number]  Laveda Abbe, NP Inpatient   06/23/2017 0327 06/23/2017 1502 Full Code [id number]  Greg[facility]   01/13/2017 0858 01/13/2017 1329 Full Code [id number]  Lucretia Roers, MD Inpatient   11/05/2016 0018 11/10/2016 1937 Full Code [id number]  Kerry Hough, PA-C Inpatient       No orders of the defined types were placed in this encounter.    Future Appointments  Date Time Provider Department Center  05/03/2023  9:30 AM [facility] [id number] [facility] None  05/03/2023  9:45 AM LINAC-SQUIRE [facility] None  05/03/2023 10:00 AM [facility] INFUSION [facility] None  05/03/2023 10:30 AM Abdul, Arvid Right, LCSW [facility] None  05/03/2023  1:45 PM Alphonse Guild, RD [facility] None  05/04/2023  9:30 AM [facility] [id number] [facility] None  05/05/2023  9:30 AM [facility] [id number] [facility] None  05/06/2023  9:30 AM [facility] LINAC 4 [facility] None  05/06/2023 10:00 AM Breedlove Blue, Blaire L, PT [id number] None  05/07/2023  9:00 AM [facility] FLUSH [facility] None  05/07/2023  9:30 AM [facility] [id number] [facility] None  05/07/2023 10:20 AM Sumner Kirchman, MD [facility] None  05/10/2023  9:30 AM [facility] [id number] [facility] None  05/10/2023 10:00 AM [facility] INFUSION [facility] None  05/10/2023 11:15 AM Alphonse Guild, RD [facility] None  05/11/2023  9:30 AM [facility] [id number] [facility] None  05/12/2023  9:30 AM [facility] [id number] [facility] None  05/13/2023  9:30 AM [facility] [id number]  [facility] None  05/14/2023  9:30 AM [facility] [id number] [facility] None  05/14/2023 10:30 AM [facility] FLUSH [facility] None  05/14/2023 11:20 AM Kourtlynn Trevor, MD [facility] None  05/17/2023  9:30 AM [facility] [id number] [facility] None  05/17/2023 10:00 AM [facility] INFUSION [facility] None  05/17/2023 11:15 AM Alphonse Guild, RD [facility] None  05/18/2023  9:30 AM [facility] [id number] [facility] None  05/19/2023  9:30 AM [facility] [id number] [facility] None  05/20/2023  9:30 AM [facility] [id number] [facility] None  05/21/2023  9:30 AM [facility] [id number] [facility] None  05/24/2023  8:00 AM [facility] FLUSH [facility] None  05/24/2023  8:30 AM Pollyann Samples, NP [facility] None  05/24/2023  9:30 AM [facility] [id number] [facility] None  05/24/2023 10:00 AM [facility] INFUSION [facility] None  05/24/2023 12:00 PM Alphonse Guild, RD [facility] None  05/25/2023  9:30 AM [facility] [id number] [facility] None  05/26/2023  9:30 AM [facility] [id number] [facility] None  05/27/2023  9:30 AM  [facility] [id number] [facility] None  05/28/2023  9:30 AM [facility] [id number] [facility] None  05/28/2023  2:15 PM [facility] FLUSH [facility] None  05/28/2023  3:00 PM Erlin Gardella, MD [facility] None  05/31/2023  9:30 AM [facility] [id number] [facility] None  05/31/2023 10:00 AM [facility] INFUSION [facility] None  05/31/2023 11:15 AM Alphonse Guild, RD [facility] None  06/01/2023  9:30 AM [facility] [id number] [facility] None  08/04/2023  1:40 PM [name], MD [facility]      This document was completed utilizing speech recognition software. Grammatical errors, random word insertions, pronoun errors, and incomplete sentences are an occasional consequence of this system due to software limitations, ambient noise, and hardware issues. Any formal questions or concerns about the content, text or information contained within the body of this dictation should be directly addressed to the  provider for clarification.

## 2023-05-03 ENCOUNTER — Encounter: Payer: Self-pay | Admitting: Oncology

## 2023-05-03 ENCOUNTER — Other Ambulatory Visit: Payer: Self-pay

## 2023-05-03 ENCOUNTER — Inpatient Hospital Stay (HOSPITAL_COMMUNITY)
Admission: EM | Admit: 2023-05-03 | Discharge: 2023-05-07 | DRG: 394 | Disposition: A | Discharge status: Home / Self Care | Payer: Medicare HMO | Attending: Family Medicine | Admitting: Family Medicine

## 2023-05-03 ENCOUNTER — Encounter (HOSPITAL_COMMUNITY): Payer: Self-pay | Admitting: *Deleted

## 2023-05-03 ENCOUNTER — Ambulatory Visit
Admission: RE | Admit: 2023-05-03 | Discharge: 2023-05-03 | Disposition: A | Discharge status: Home / Self Care | Payer: Medicare HMO | Source: Ambulatory Visit | Attending: Radiation Oncology | Admitting: Radiation Oncology

## 2023-05-03 ENCOUNTER — Emergency Department (HOSPITAL_COMMUNITY): Payer: Medicare HMO

## 2023-05-03 ENCOUNTER — Other Ambulatory Visit: Payer: Self-pay | Admitting: Radiation Oncology

## 2023-05-03 ENCOUNTER — Inpatient Hospital Stay: Payer: Medicare HMO

## 2023-05-03 ENCOUNTER — Ambulatory Visit (HOSPITAL_COMMUNITY)
Admission: RE | Admit: 2023-05-03 | Discharge: 2023-05-03 | Disposition: A | Discharge status: Home / Self Care | Payer: Medicare Other | Source: Ambulatory Visit | Attending: Radiation Oncology | Admitting: Radiation Oncology

## 2023-05-03 VITALS — BP 119/90 | HR 78 | Temp 99.7°F | Wt 146.2 lb

## 2023-05-03 DIAGNOSIS — I700 Atherosclerosis of aorta: Secondary | ICD-10-CM | POA: Insufficient documentation

## 2023-05-03 DIAGNOSIS — I10 Essential (primary) hypertension: Secondary | ICD-10-CM | POA: Diagnosis not present

## 2023-05-03 DIAGNOSIS — F419 Anxiety disorder, unspecified: Secondary | ICD-10-CM | POA: Diagnosis present

## 2023-05-03 DIAGNOSIS — Z1152 Encounter for screening for COVID-19: Secondary | ICD-10-CM

## 2023-05-03 DIAGNOSIS — L03311 Cellulitis of abdominal wall: Secondary | ICD-10-CM | POA: Diagnosis not present

## 2023-05-03 DIAGNOSIS — L089 Local infection of the skin and subcutaneous tissue, unspecified: Secondary | ICD-10-CM | POA: Diagnosis present

## 2023-05-03 DIAGNOSIS — Z6825 Body mass index (BMI) 25.0-25.9, adult: Secondary | ICD-10-CM

## 2023-05-03 DIAGNOSIS — K219 Gastro-esophageal reflux disease without esophagitis: Secondary | ICD-10-CM | POA: Diagnosis present

## 2023-05-03 DIAGNOSIS — Z79899 Other long term (current) drug therapy: Secondary | ICD-10-CM | POA: Insufficient documentation

## 2023-05-03 DIAGNOSIS — K9422 Gastrostomy infection: Principal | ICD-10-CM | POA: Diagnosis present

## 2023-05-03 DIAGNOSIS — K429 Umbilical hernia without obstruction or gangrene: Secondary | ICD-10-CM | POA: Insufficient documentation

## 2023-05-03 DIAGNOSIS — Z510 Encounter for antineoplastic radiation therapy: Secondary | ICD-10-CM | POA: Insufficient documentation

## 2023-05-03 DIAGNOSIS — C321 Malignant neoplasm of supraglottis: Secondary | ICD-10-CM | POA: Diagnosis not present

## 2023-05-03 DIAGNOSIS — Z800 Family history of malignant neoplasm of digestive organs: Secondary | ICD-10-CM | POA: Diagnosis not present

## 2023-05-03 DIAGNOSIS — Z923 Personal history of irradiation: Secondary | ICD-10-CM | POA: Insufficient documentation

## 2023-05-03 DIAGNOSIS — Z87891 Personal history of nicotine dependence: Secondary | ICD-10-CM | POA: Diagnosis not present

## 2023-05-03 DIAGNOSIS — B974 Respiratory syncytial virus as the cause of diseases classified elsewhere: Secondary | ICD-10-CM | POA: Diagnosis not present

## 2023-05-03 DIAGNOSIS — Z5111 Encounter for antineoplastic chemotherapy: Secondary | ICD-10-CM | POA: Insufficient documentation

## 2023-05-03 DIAGNOSIS — B338 Other specified viral diseases: Secondary | ICD-10-CM | POA: Diagnosis present

## 2023-05-03 DIAGNOSIS — R110 Nausea: Secondary | ICD-10-CM | POA: Insufficient documentation

## 2023-05-03 DIAGNOSIS — Z7952 Long term (current) use of systemic steroids: Secondary | ICD-10-CM | POA: Insufficient documentation

## 2023-05-03 DIAGNOSIS — R109 Unspecified abdominal pain: Secondary | ICD-10-CM | POA: Diagnosis not present

## 2023-05-03 DIAGNOSIS — Z8042 Family history of malignant neoplasm of prostate: Secondary | ICD-10-CM | POA: Diagnosis not present

## 2023-05-03 DIAGNOSIS — E7800 Pure hypercholesterolemia, unspecified: Secondary | ICD-10-CM | POA: Diagnosis not present

## 2023-05-03 DIAGNOSIS — Z9221 Personal history of antineoplastic chemotherapy: Secondary | ICD-10-CM | POA: Insufficient documentation

## 2023-05-03 DIAGNOSIS — F319 Bipolar disorder, unspecified: Secondary | ICD-10-CM | POA: Diagnosis present

## 2023-05-03 DIAGNOSIS — Z8673 Personal history of transient ischemic attack (TIA), and cerebral infarction without residual deficits: Secondary | ICD-10-CM | POA: Diagnosis not present

## 2023-05-03 DIAGNOSIS — E440 Moderate protein-calorie malnutrition: Secondary | ICD-10-CM | POA: Diagnosis not present

## 2023-05-03 DIAGNOSIS — Z452 Encounter for adjustment and management of vascular access device: Secondary | ICD-10-CM | POA: Diagnosis not present

## 2023-05-03 DIAGNOSIS — R058 Other specified cough: Secondary | ICD-10-CM | POA: Diagnosis not present

## 2023-05-03 HISTORY — DX: Gastrostomy infection: K94.22

## 2023-05-03 HISTORY — PX: IR PATIENT EVAL TECH 0-60 MINS: IMG5564

## 2023-05-03 HISTORY — DX: Local infection of the skin and subcutaneous tissue, unspecified: L08.9

## 2023-05-03 HISTORY — DX: Other specified viral diseases: B33.8

## 2023-05-03 LAB — CBC WITH DIFFERENTIAL/PLATELET
Abs Immature Granulocytes: 0.06 10*3/uL (ref 0.00–0.07)
Basophils Absolute: 0 10*3/uL (ref 0.0–0.1)
Basophils Relative: 0 %
Eosinophils Absolute: 0.1 10*3/uL (ref 0.0–0.5)
Eosinophils Relative: 1 %
HCT: 40.9 % (ref 39.0–52.0)
Hemoglobin: 13.3 g/dL (ref 13.0–17.0)
Immature Granulocytes: 1 %
Lymphocytes Relative: 10 %
Lymphs Abs: 0.9 10*3/uL (ref 0.7–4.0)
MCH: 29.7 pg (ref 26.0–34.0)
MCHC: 32.5 g/dL (ref 30.0–36.0)
MCV: 91.3 fL (ref 80.0–100.0)
Monocytes Absolute: 0.7 10*3/uL (ref 0.1–1.0)
Monocytes Relative: 8 %
Neutro Abs: 7.1 10*3/uL (ref 1.7–7.7)
Neutrophils Relative %: 80 %
Platelets: 170 10*3/uL (ref 150–400)
RBC: 4.48 MIL/uL (ref 4.22–5.81)
RDW: 13.1 % (ref 11.5–15.5)
WBC: 8.9 10*3/uL (ref 4.0–10.5)
nRBC: 0 % (ref 0.0–0.2)

## 2023-05-03 LAB — COMPREHENSIVE METABOLIC PANEL
ALT: 13 U/L (ref 0–44)
AST: 14 U/L — ABNORMAL LOW (ref 15–41)
Albumin: 3.6 g/dL (ref 3.5–5.0)
Alkaline Phosphatase: 49 U/L (ref 38–126)
Anion gap: 9 (ref 5–15)
BUN: 11 mg/dL (ref 6–20)
CO2: 27 mmol/L (ref 22–32)
Calcium: 9 mg/dL (ref 8.9–10.3)
Chloride: 99 mmol/L (ref 98–111)
Creatinine, Ser: 0.82 mg/dL (ref 0.61–1.24)
GFR, Estimated: >60 mL/min (ref >60)
Glucose, Bld: 101 mg/dL — ABNORMAL HIGH (ref 70–99)
Potassium: 4.3 mmol/L (ref 3.5–5.1)
Sodium: 135 mmol/L (ref 135–145)
Total Bilirubin: 0.8 mg/dL (ref 0.0–1.2)
Total Protein: 8 g/dL (ref 6.5–8.1)

## 2023-05-03 LAB — RAD ONC ARIA SESSION SUMMARY
Course Elapsed Days: 19
Plan Fractions Treated to Date: 14
Plan Prescribed Dose Per Fraction: 2 Gy
Plan Total Fractions Prescribed: 35
Plan Total Prescribed Dose: 70 Gy
Reference Point Dosage Given to Date: 28 Gy
Reference Point Session Dosage Given: 2 Gy
Session Number: 14

## 2023-05-03 LAB — RESP PANEL BY RT-PCR (RSV, FLU A&B, COVID)  RVPGX2
Influenza A by PCR: NEGATIVE
Influenza B by PCR: NEGATIVE
Resp Syncytial Virus by PCR: POSITIVE — AB
SARS Coronavirus 2 by RT PCR: NEGATIVE

## 2023-05-03 LAB — I-STAT CG4 LACTIC ACID, ED: Lactic Acid, Venous: 0.5 mmol/L (ref 0.5–1.9)

## 2023-05-03 MED ORDER — VANCOMYCIN HCL IN DEXTROSE 1-5 GM/200ML-% IV SOLN
1000.0000 mg | Freq: Once | INTRAVENOUS | Status: AC
  Administered 2023-05-03: 1000 mg via INTRAVENOUS
  Filled 2023-05-03: qty 200

## 2023-05-03 MED ORDER — VANCOMYCIN HCL 1750 MG/350ML IV SOLN
1750.0000 mg | INTRAVENOUS | Status: DC
  Administered 2023-05-04: 1750 mg via INTRAVENOUS
  Filled 2023-05-03: qty 350

## 2023-05-03 MED ORDER — MORPHINE SULFATE (PF) 2 MG/ML IV SOLN
2.0000 mg | INTRAVENOUS | Status: DC | PRN
  Administered 2023-05-04: 2 mg via INTRAVENOUS
  Filled 2023-05-03: qty 1

## 2023-05-03 MED ORDER — HYDROCODONE-ACETAMINOPHEN 5-325 MG PO TABS
1.0000 | ORAL_TABLET | ORAL | Status: DC | PRN
  Administered 2023-05-03: 2 via ORAL
  Administered 2023-05-04: 1 via ORAL
  Filled 2023-05-03: qty 2
  Filled 2023-05-03: qty 1

## 2023-05-03 MED ORDER — HYDROXYZINE HCL 25 MG PO TABS
25.0000 mg | ORAL_TABLET | Freq: Three times a day (TID) | ORAL | Status: DC | PRN
Start: 2023-05-03 — End: 2023-05-07

## 2023-05-03 MED ORDER — ALBUTEROL SULFATE (2.5 MG/3ML) 0.083% IN NEBU: 2.5000 mg | INHALATION_SOLUTION | RESPIRATORY_TRACT | Status: DC | PRN

## 2023-05-03 MED ORDER — QUETIAPINE FUMARATE 50 MG PO TABS
50.0000 mg | ORAL_TABLET | Freq: Every day | ORAL | Status: DC
  Administered 2023-05-04 – 2023-05-06 (×3): 50 mg via ORAL
  Filled 2023-05-03 (×3): qty 1

## 2023-05-03 MED ORDER — ONDANSETRON HCL 4 MG PO TABS: 4.0000 mg | ORAL_TABLET | Freq: Four times a day (QID) | ORAL | Status: DC | PRN

## 2023-05-03 MED ORDER — ACETAMINOPHEN 325 MG PO TABS
650.0000 mg | ORAL_TABLET | Freq: Four times a day (QID) | ORAL | Status: DC | PRN
  Administered 2023-05-04: 650 mg via ORAL
  Filled 2023-05-03: qty 2

## 2023-05-03 MED ORDER — LACTATED RINGERS IV SOLN: INTRAVENOUS | Status: AC

## 2023-05-03 MED ORDER — SENNOSIDES-DOCUSATE SODIUM 8.6-50 MG PO TABS: 1.0000 | ORAL_TABLET | Freq: Every evening | ORAL | Status: DC | PRN

## 2023-05-03 MED ORDER — ACETAMINOPHEN 650 MG RE SUPP: 650.0000 mg | Freq: Four times a day (QID) | RECTAL | Status: DC | PRN

## 2023-05-03 MED ORDER — SCOPOLAMINE 1 MG/3DAYS TD PT72: 1.0000 | MEDICATED_PATCH | TRANSDERMAL | 2 refills | Status: DC

## 2023-05-03 MED ORDER — ONDANSETRON HCL 4 MG/2ML IJ SOLN: 4.0000 mg | Freq: Four times a day (QID) | INTRAMUSCULAR | Status: DC | PRN

## 2023-05-03 MED ORDER — SCOPOLAMINE 1 MG/3DAYS TD PT72
1.0000 | MEDICATED_PATCH | TRANSDERMAL | Status: DC
  Administered 2023-05-04 – 2023-05-07 (×2): 1.5 mg via TRANSDERMAL
  Filled 2023-05-03 (×2): qty 1

## 2023-05-03 MED ORDER — LIDOCAINE VISCOUS HCL 2 % MT SOLN: 10.0000 mL | Freq: Four times a day (QID) | OROMUCOSAL | Status: DC | PRN

## 2023-05-03 MED ORDER — CEFTRIAXONE SODIUM 2 G IJ SOLR
2.0000 g | INTRAMUSCULAR | Status: DC
  Administered 2023-05-03 – 2023-05-06 (×4): 2 g via INTRAVENOUS
  Filled 2023-05-03 (×4): qty 20

## 2023-05-03 MED ORDER — IOHEXOL 300 MG/ML  SOLN
100.0000 mL | Freq: Once | INTRAMUSCULAR | Status: AC | PRN
  Administered 2023-05-03: 100 mL via INTRAVENOUS

## 2023-05-03 MED ORDER — GUAIFENESIN ER 600 MG PO TB12: 600.0000 mg | ORAL_TABLET | Freq: Two times a day (BID) | ORAL | Status: DC | PRN

## 2023-05-03 MED ORDER — FLUOXETINE HCL 20 MG PO CAPS
20.0000 mg | ORAL_CAPSULE | Freq: Every day | ORAL | Status: DC
Start: 2023-05-04 — End: 2023-05-07
  Administered 2023-05-04 – 2023-05-07 (×4): 20 mg via ORAL
  Filled 2023-05-03 (×4): qty 1

## 2023-05-03 MED ORDER — FAMOTIDINE 20 MG PO TABS
40.0000 mg | ORAL_TABLET | Freq: Every day | ORAL | Status: DC
  Administered 2023-05-04 – 2023-05-07 (×4): 40 mg via ORAL
  Filled 2023-05-03 (×4): qty 2

## 2023-05-03 MED ORDER — OXYMETAZOLINE HCL 0.05 % NA SOLN
2.0000 | Freq: Once | NASAL | Status: AC
  Administered 2023-05-03: 2 via NASAL
  Filled 2023-05-03: qty 30

## 2023-05-03 NOTE — ED Provider Notes (Signed)
[facility] AT [facility] Provider Note   CSN: [id number] Arrival date & time: 05/03/23  1236     History  Chief Complaint  Patient presents with   Infection around G Tube     [name] is a 53 y.o. 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 Patient also with subjective fevers/chills, nausea, vomiting, cough and malaise x2 days. Patient went to IR/cancer appointment today where swelling and redness was seen around G-tube and the bumper was loosened a pus began to flow from the site. Patient referred to ED for further evaluation/possible admission for IV antibiotics.  HPI     Home Medications Prior to Admission medications   Medication Sig Start Date End Date Taking? Authorizing Provider  amlodipine-olmesartan (AZOR) 10-20 MG tablet Take 1 tablet by mouth daily. 02/04/23   [name], MD  dexamethasone (DECADRON) 4 MG tablet Take 2 tablets (8 mg) by mouth daily x 3 days starting the day after cisplatin chemotherapy. Take with food. 04/14/23   Pasam, Archie Patten, MD  famotidine (PEPCID) 40 MG tablet Take 1 tablet (40 mg total) by mouth daily. 02/04/23   [name], MD  FLUoxetine (PROZAC) 20 MG capsule Take 1 capsule (20 mg total) by mouth daily. 03/29/23   [name], MD  fluticasone (FLONASE) 50 MCG/ACT nasal spray Place 1 spray into both nostrils 2 (two) times daily. 10/28/22   Particia Nearing, PA-C  HYDROcodone-acetaminophen (NORCO) 5-325 MG tablet Take 1-2 tablets by mouth every 6 (six) hours as needed for moderate pain (pain score 4-6). 04/28/23   Allred, Darrell K, PA-C  hydrOXYzine (ATARAX) 25 MG tablet Take 1 tablet (25 mg total) by mouth 3 (three) times daily as needed for anxiety. 02/04/23   [name], MD  lidocaine  (XYLOCAINE) 2 % solution Patient: Mix 1part 2% viscous lidocaine, 1part H20. Swallow 10mL of diluted mixture, before meals and at bedtime, up to QID 03/26/23   Lonie Peak, MD  lidocaine-prilocaine (EMLA) cream Apply to affected area once 04/14/23   Pasam, Avinash, MD  metoprolol succinate (TOPROL-XL) 25 MG 24 hr tablet Take 1 tablet (25 mg total) by mouth daily. 02/04/23   [name], MD  nicotine (NICODERM CQ - DOSED IN MG/24 HOURS) 14 mg/24hr patch Place 1 patch (14 mg total) onto the skin daily. Apply 21 mg patch daily x 6 wk, then 14mg  patch daily x 2 wk, then 7 mg patch daily x 2 wk 03/26/23   Lonie Peak, MD  nicotine (NICODERM CQ - DOSED IN MG/24 HOURS) 21 mg/24hr patch Place 1 patch (21 mg total) onto the skin daily. Apply 21 mg patch daily x 6 wk, then 14mg  patch daily x 2 wk, then 7 mg patch daily x 2 wk 03/26/23   Lonie Peak, MD  nicotine (NICODERM CQ - DOSED IN MG/24 HR) 7 mg/24hr patch Place 1 patch (7 mg total) onto the skin daily. Apply 21 mg patch daily x 6 wk, then 14mg  patch daily x 2 wk, then 7 mg patch daily x 2 wk 03/26/23   Lonie Peak, MD  ondansetron (ZOFRAN) 8 MG tablet Take 1 tablet (8 mg total) by mouth every 8 (eight) hours as needed for nausea or vomiting. Start on the third day after cisplatin. 04/14/23   Pasam, Avinash,  MD  prochlorperazine (COMPAZINE) 10 MG tablet TAKE 1 TABLET(10 MG) BY MOUTH EVERY 6 HOURS AS NEEDED FOR NAUSEA OR VOMITING 04/29/23   Pasam, Avinash, MD  QUEtiapine (SEROQUEL) 200 MG tablet Take 1 tablet (200 mg total) by mouth at bedtime. 05/02/23   Elsie Lincoln, MD  QUEtiapine (SEROQUEL) 50 MG tablet Take one pill nightly for 1 week. Then increase to 2 pills nightly for 1 week. Then increase to 3 pills nightly for one week. Then increase to 4 pills nightly for one week. 04/05/23   Elsie Lincoln, MD  scopolamine (TRANSDERM-SCOP) 1 MG/3DAYS Place 1 patch (1.5 mg total) onto the skin every 3 (three) days. 05/03/23   Lonie Peak, MD       Allergies    Patient has no known allergies.    Review of Systems   Review of Systems  Skin:  Positive for rash.    Physical Exam Updated Vital Signs BP 129/85   Pulse 69   Temp 99.5 F (37.5 C) (Oral)   Resp 19   Wt 66.3 kg   SpO2 90%   BMI 25.10 kg/m  Physical Exam Vitals and nursing note reviewed.  Constitutional:      General: He is not in acute distress. HENT:     Head: Normocephalic and atraumatic.     Mouth/Throat:     Mouth: Mucous membranes are moist.     Pharynx: No oropharyngeal exudate or posterior oropharyngeal erythema.  Eyes:     General: No scleral icterus.       Right eye: No discharge.        Left eye: No discharge.     Conjunctiva/sclera: Conjunctivae normal.  Cardiovascular:     Rate and Rhythm: Normal rate and regular rhythm.     Pulses: Normal pulses.     Heart sounds: Normal heart sounds. No murmur heard. Pulmonary:     Effort: Pulmonary effort is normal. No respiratory distress.     Breath sounds: Wheezing present. No rhonchi or rales.     Comments: Expiratory wheezing in BL lungs. Abdominal:     General: Abdomen is flat. Bowel sounds are normal.     Tenderness: There is abdominal tenderness.     Comments: Approx 6cmx4cm area of erythema and tenderness surrounding PEG tube site. Some blood and pus on surrounding gauze.   Musculoskeletal:     Right lower leg: No edema.     Left lower leg: No edema.  Skin:    General: Skin is warm and dry.     Findings: No rash.  Neurological:     General: No focal deficit present.     Mental Status: He is alert and oriented to person, place, and time. Mental status is at baseline.  Psychiatric:        Mood and Affect: Mood normal.        Behavior: Behavior normal.     ED Results / Procedures / Treatments   Labs (all labs ordered are listed, but only abnormal results are displayed) Labs Reviewed  RESP PANEL BY RT-PCR (RSV, FLU A&B, COVID)  RVPGX2 - Abnormal; Notable for the following  components:      Result Value   Resp Syncytial Virus by PCR POSITIVE (*)    All other components within normal limits  COMPREHENSIVE METABOLIC PANEL - Abnormal; Notable for the following components:   Glucose, Bld 101 (*)    AST 14 (*)    All other components within normal limits  CULTURE, BLOOD (ROUTINE X 2)  CULTURE, BLOOD (ROUTINE X 2)  CBC WITH DIFFERENTIAL/PLATELET  LACTIC ACID, PLASMA  LACTIC ACID, PLASMA  LIPASE, BLOOD  I-STAT CHEM 8, ED  I-STAT CG4 LACTIC ACID, ED    EKG None  Radiology CT ABDOMEN PELVIS W CONTRAST Result Date: 05/03/2023 CLINICAL DATA:  Peritonitis or perforation suspected. Abdominal pain. Nausea. Gastrostomy tube insertion. EXAM: CT ABDOMEN AND PELVIS WITH CONTRAST TECHNIQUE: Multidetector CT imaging of the abdomen and pelvis was performed using the standard protocol following bolus administration of intravenous contrast. RADIATION DOSE REDUCTION: This exam was performed according to the departmental dose-optimization program which includes automated exposure control, adjustment of the mA and/or kV according to patient size and/or use of iterative reconstruction technique. CONTRAST:  OMNIPAQUE IOHEXOL 300 MG/ML  SOLN COMPARISON:  PET-CT 03/23/2023. FINDINGS: Lower chest: No acute abnormality. Hepatobiliary: No focal liver abnormality is seen. No gallstones, gallbladder wall thickening, or biliary dilatation. Pancreas: Unremarkable. No pancreatic ductal dilatation or surrounding inflammatory changes. Spleen: Normal in size without focal abnormality. Adrenals/Urinary Tract: Subcentimeter rounded hypodensities in the left kidney are too small to characterize, likely cysts. Otherwise, the adrenal glands, kidneys and bladder are within normal limits. Stomach/Bowel: There is a new percutaneous gastrostomy tube in the body of the stomach. There is mild stranding surrounding the stomach at the level of the gastrostomy tube without fluid collection or free air. The  stomach otherwise appears within normal limits. Appendix is not seen. No evidence of other bowel wall thickening, distention, or inflammatory changes. Vascular/Lymphatic: Aortic atherosclerosis. No enlarged abdominal or pelvic lymph nodes. Reproductive: Prostate is unremarkable. Other: There is a small fat containing umbilical hernia. There is no free fluid or free air. There some subcutaneous and intramuscular edema/stranding at the level of the gastrostomy. No drainable fluid collections are seen. No soft tissue gas. Musculoskeletal: No acute or significant osseous findings. IMPRESSION: 1. New percutaneous gastrostomy tube in the body of the stomach. There is mild stranding surrounding the stomach at the level of the gastrostomy tube without fluid collection or free air. 2. Subcutaneous and intramuscular edema/stranding at the level of the gastrostomy tube. No drainable fluid collections. 3. No other acute localizing process in the abdomen or pelvis. Aortic Atherosclerosis (ICD10-I70.0). Electronically Signed   By: Darliss Cheney M.D.   On: 05/03/2023 17:35   DG Chest 2 View Result Date: 05/03/2023 CLINICAL DATA:  Productive cough.  Laryngeal carcinoma. EXAM: CHEST - 2 VIEW COMPARISON:  None Available. FINDINGS: The heart size and mediastinal contours are within normal limits. Right-sided Port-A-Cath is seen in appropriate position. Both lungs are clear. The visualized skeletal structures are unremarkable. IMPRESSION: No active cardiopulmonary disease. Electronically Signed   By: Danae Orleans M.D.   On: 05/03/2023 15:36   IR PATIENT EVAL TECH 0-60 MINS Result Date: 05/03/2023 Barrett Shell, RT     05/03/2023  2:21 PM Patient presented to IR today with complaints of redness, tenderness, and swelling around newly placed gastrostomy tube site.  Gastrostomy tube bumper appeared to be tight to skin, likely due to swelling.  After bumper was loosened, pus came out from around the tube site. At this time, S.  Rae Roam, [place] consulted D. Deanne Coffer, MD, who advised patient to go to Emergency Department to be treated for infection.    Procedures Procedures    Medications Ordered in ED Medications  vancomycin (VANCOCIN) IVPB 1000 mg/200 mL premix (1,000 mg Intravenous New Bag/Given 05/03/23 1616)  iohexol (OMNIPAQUE) 300 MG/ML solution 100 mL (100  mLs Intravenous Contrast Given 05/03/23 1555)    ED Course/ Medical Decision Making/ A&P                                 Medical Decision Making Amount and/or Complexity of Data Reviewed Labs: ordered. Radiology: ordered.  Risk Prescription drug management.    This patient presents to the ED for concern of PEG tube infection, this involves an extensive number of treatment options, and is a complaint that carries with it a high risk of complications and morbidity.  The differential diagnosis includes gastroenteritis, cellulitis, abscess, sepsis   Co morbidities that complicate the patient evaluation  bipolar 1 disorder, GERD, HLD, HTN, polysubstance use disorder, stroke, supraglottic cancer s/p pull through gastostomy placement 04/28/2023   Additional history obtained:  Additional history obtained from 05/03/2023 IR note: Site assessed in IR bay this morning - significant induration surrounding insertion site which is extremely tender to touch, warm and red. The bumper was noted to be quite tight, likely due to swelling, and when the bumper was loosened significant amount of pus began to flow from the site. Given appearance of insertion site consistent with infection with large area of induration and significant pus coming from site there is concern for abdominal wall abscess vs cellulitis. Patient reports fevers, chills, n/v, malaise which are all worsening. As such patient will be taken to the ED for further evaluation/possible admission for IV antibiotics.  began radiation treatments from 04/14/2023. Began chemotherapy with cisplatin from 04/19/2023.     Problem List / ED Course / Critical interventions / Medication management  Admitting patient for purulent cellulitis of PEG tube. Patient presented for infection around PEG tube. Also with 2 days of fever, nausea, vomiting, cough. Patient went to cancer appointment today where the PEG bumper was loosened and pus drained from PEG site. Physical exam with approx 6x4cm area of erythema and  tenderness around PEG tube. O2 in the lower 90's on RA. Other vitals reassuring.  I Ordered, and personally interpreted labs.  The pertinent results include: CMP reassuring.  CBC without leukocytosis or anemia.  Respiratory panel positive for RSV.  Blood cultures pending. I ordered imaging studies including CT Abd/Pelvis with contrast: evaluate for structural/surgical etiology of patients' severe abdominal pain. I independently visualized and interpreted imaging and I agree with the radiologist interpretation of cellulitis without fluid/abscess. Provided patient with Vanc. Staffed patient with Dr. Andria Meuse who agrees with plan.  Dr. Allena Katz admitting provider.  I have reviewed the patients home medicines and have made adjustments as needed   Social Determinants of Health:  none          Final Clinical Impression(s) / ED Diagnoses Final diagnoses:  Cellulitis of abdominal wall    Rx / DC Orders ED Discharge Orders     None         Margarita Rana 05/03/23 1833    Linwood Dibbles, MD 05/03/23 1904

## 2023-05-03 NOTE — Hospital Course (Signed)
[name] is a 53 y.o. 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

## 2023-05-03 NOTE — Progress Notes (Signed)
At under treatment visit today, pt reports sensation of foreign body in throat.  Out of caution, laryngoscopy was performed:  PROCEDURE NOTE: After obtaining consent and spraying nasal cavity with topical lidocaine and oxymetazoline, the flexible endoscope was coated with lidocaine gel and introduced and passed through the nasal cavity.  The nasopharynx, oropharynx, hypopharynx, and larynx were then examined. No foreign bodies or loose tissue appreciated in the mucosal axis.  The true cords were symmetrically mobile. There is diffuse erythema and mild swelling throughout the mucosal axis.  The patient tolerated the procedure well.  Scopolamine Rx'd today due to c/o excessive sputum.  Pt heading to IR due to PEG tube concerns/signs of possible infection. -----------------------------------  Sergio Peak, MD

## 2023-05-03 NOTE — Progress Notes (Signed)
Patient s/p pull through gastrostomy placement 04/28/23 in IR who presented to the cancer center today for radiation check in appointment where he reported redness, extreme tenderness and firmness at the G tube insertion site. He reports fevers at home (per wife temp up to 103 this weekend), alternating chills/sweating, nausea, vomiting and general malaise for the last 2 or so days.  Site assessed in IR bay this morning - significant induration surrounding insertion site which is extremely tender to touch, warm and red. The bumper was noted to be quite tight, likely due to swelling, and when the bumper was loosened significant amount of pus began to flow from the site.  Given appearance of insertion site consistent with infection with large area of induration and significant pus coming from site there is concern for abdominal wall abscess vs cellulitis. Patient reports fevers, chills, n/v, malaise which are all worsening. As such patient will be taken to the ED for further evaluation/possible admission for IV antibiotics.   Above discussed with patient and his wife, including admission/appropriate antibiotic therapy will be up to the discretion of the ED provider after he is formally evaluated. They state understanding and are in agreement with this plan.  Lynnette Caffey, PA-C

## 2023-05-03 NOTE — Progress Notes (Signed)
Nutrition  RD scheduled to see patient during chemo infusion today but cancelled.  Patient sent to ED due to possible PEG tube infection.    Communication sent to inpatient RD team to see patient if he is admitted.    Patient has been rescheduled with RD on 2/5 during infusion.    [name] B. Freida Busman, RD, LDN Registered Dietitian [phone]

## 2023-05-03 NOTE — Procedures (Signed)
Patient presented to IR today with complaints of redness, tenderness, and swelling around newly placed gastrostomy tube site.  Gastrostomy tube bumper appeared to be tight to skin, likely due to swelling.  After bumper was loosened, pus came out from around the tube site. At this time, S. Rae Roam, [place] consulted D. Deanne Coffer, MD, who advised patient to go to Emergency Department to be treated for infection.

## 2023-05-03 NOTE — Progress Notes (Signed)
Patient seen by Dr. Basilio Cairo for weekly check-in appointment after radiation. Patient complaining of increased tenderness, distention, and redness around PEG site. Reports he has been feeling achy and alternating between hot/cold since the weekend. Oral temp was 99.7 but patient reports he took 2 Tylenol around 7am.   Dr. Basilio Cairo performed a laryngoscope due to patient feeling like he had something hanging in his throat. Per Dr. Basilio Cairo no foreign body visualized; redness/irritation related to radiation treatment noted but not concerning.   Patient escorted to radiology via wheelchair by [facility] [place] Navigator so patient's abdomen/PEG site can be assessed by IR team  Infusion charge nurse updated on patient's status and chemotherapy appointment has been rescheduled for 05/05/23  Vitals:   05/03/23 1001 05/03/23 1003  BP: 124/87 (!) 119/90  Pulse: 78   Temp: 99.7 F (37.6 C)

## 2023-05-03 NOTE — ED Notes (Signed)
Patient care taken, resting awaiting admission.

## 2023-05-03 NOTE — H&P (Signed)
History and Physical    [name] LKG:[id number] DOB: Apr 29, 1970 DOA: 05/03/2023  PCP: [name], MD  Patient coming from: Home  I have personally briefly reviewed patient's old medical records in [facility]  Chief Complaint: Abdominal pain, swelling and redness around feeding tube site  HPI: [name] is a 53 y.o. 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  Patient states the day following his G-tube insertion he began to have abdominal pain near the insertion site.  He has been using the site for tube feeds due to inability to maintain adequate oral intake.  Patient presented to the cancer center earlier today for postradiation check-in appointment as well as chemotherapy appointment.  He had been complaining of abdominal pain with swelling and redness around his PEG tube site.  He had been experiencing fevers, chills, sweats, nausea, vomiting, and malaise over the last 2 days.  He also felt like there was something hanging in his throat.  He was evaluated by his radiation oncologist Dr. Basilio Cairo who performed a laryngoscopy.  No foreign bodies or loose tissue appreciated.  Diffuse erythema and mild swelling throughout the mucosal axis was noted, expected from radiation treatment.  Patient was sent to IR due to concern of G-tube site infection.  Site was assessed by IR and noted to have significant induration and erythema at insertion site of G-tube.  When bumper was loosened a significant amount of pus began to flow from the site.  Patient was then sent to the ED for further management.  Patient states that he is able to take some small amount of liquids by mouth as well as medications but overall oral intake is limited due  to pain.  He has had a nonproductive cough.  He denies shortness of breath.  ED Course  Labs/Imaging on admission: I have personally reviewed following labs and imaging studies.  Initial vitals showed BP 144/87, pulse 67, RR 23, temp 99.6 F, SpO2 95% on room air.  Labs showed WBC 8.9, hemoglobin 13.3, platelets 170,000, sodium 135, potassium 4.3, bicarb 27, BUN 11, creatinine 0.82, serum glucose 101, lactic acid 0.5.  Blood cultures pending collection.  RSV PCR is positive.  COVID and influenza PCR negative.  2 view chest x-ray negative for focal consolidation, edema, effusion.  Right-sided Port-A-Cath seen in appropriate position.  CT abdomen/pelvis with contrast showed percutaneous gastrostomy tube in the body of the stomach with mild stranding surrounding the stomach at the level of the gastrostomy tube without fluid collection or free air.  Subcutaneous and intramuscular edema/stranding at the level of the gastrostomy tube noted without drainable fluid collections.  No other acute localizing process in the abdomen or pelvis.  Patient was given IV vancomycin.  The hospitalist service was consulted to admit for further evaluation and management.  Review of Systems: All systems reviewed and are negative except as documented in history of present illness above.   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   Substance induced mood disorder (HCC) 11/05/2016    Past Surgical History:  Procedure Laterality Date   ANEURYSM COILING     AGE 72 x2   APPENDECTOMY     BIOPSY  01/05/2017   Procedure: BIOPSY;  Surgeon: West Bali, MD;  Location: [facility];  Service: Endoscopy;;   Duodenal and Gastric   COLONOSCOPY WITH PROPOFOL N/A 01/05/2017   Procedure: COLONOSCOPY WITH PROPOFOL;  Surgeon: West Bali, MD;  Location: [facility];  Service: Endoscopy;  Laterality: N/A;  10:00am   DIRECT LARYNGOSCOPY Left 03/16/2023   Procedure: Direct laryngoscopy with biopsy of supraglottic mass;  Surgeon: Newman Pies, MD;  Location: [facility];  Service: ENT;  Laterality: Left;   ESOPHAGOGASTRODUODENOSCOPY (EGD) WITH PROPOFOL N/A 01/05/2017   Procedure: ESOPHAGOGASTRODUODENOSCOPY (EGD) WITH PROPOFOL;  Surgeon: West Bali, MD;  Location: [facility];  Service: Endoscopy;  Laterality: N/A;   IR GASTROSTOMY TUBE MOD SED  04/28/2023   IR IMAGING GUIDED PORT INSERTION  04/28/2023   IR PATIENT EVAL TECH 0-60 MINS  05/03/2023   LAPAROSCOPIC APPENDECTOMY     MASS EXCISION Right 01/13/2017   Procedure: EXCISION LIPOMA RIGHT ARM;  Surgeon: Lucretia Roers, MD;  Location: [facility];  Service: General;  Laterality: Right;    Social History:  reports that he has quit smoking. His smoking use included cigarettes. He has a 6.3 pack-year smoking history. He has never used smokeless tobacco. He reports current alcohol use of about 1.0 standard drink of alcohol per week. He reports that he does not currently use drugs after having used the following drugs: "Crack" cocaine, Cocaine, and Marijuana.  No Known Allergies  Family History  Problem Relation Age of Onset   Cancer Mother        Lung   Stomach cancer Mother    Cancer Sister        Bone cancer   Bone cancer Sister 6   Cancer Brother        3 brothers with prostate cancer   Colon cancer Brother 56   Colon polyps Neg Hx      Prior to Admission medications   Medication Sig Start Date End Date Taking? Authorizing Provider  amlodipine-olmesartan (AZOR) 10-20 MG tablet Take 1 tablet by mouth daily. 02/04/23   [name], MD  dexamethasone (DECADRON) 4 MG tablet Take 2 tablets (8 mg) by mouth daily x 3 days  starting the day after cisplatin chemotherapy. Take with food. 04/14/23   Pasam, Archie Patten, MD  famotidine (PEPCID) 40 MG tablet Take 1 tablet (40 mg total) by mouth daily. 02/04/23   [name], MD  FLUoxetine (PROZAC) 20 MG capsule Take 1 capsule (20 mg total) by mouth daily. 03/29/23   [name], MD  fluticasone (FLONASE) 50 MCG/ACT nasal spray Place 1 spray into both nostrils 2 (two) times daily. 10/28/22   Particia Nearing, PA-C  HYDROcodone-acetaminophen (NORCO) 5-325 MG tablet Take 1-2 tablets by mouth every 6 (six) hours as needed for moderate pain (pain score 4-6). 04/28/23   Allred, Darrell K, PA-C  hydrOXYzine (ATARAX) 25 MG tablet Take 1 tablet (25 mg total) by mouth 3 (three) times daily as needed for anxiety. 02/04/23   [name], MD  lidocaine (XYLOCAINE) 2 % solution Patient: Mix 1part 2% viscous lidocaine, 1part H20. Swallow 10mL of diluted mixture, before meals and at bedtime,  up to QID 03/26/23   Lonie Peak, MD  lidocaine-prilocaine (EMLA) cream Apply to affected area once 04/14/23   Pasam, Avinash, MD  metoprolol succinate (TOPROL-XL) 25 MG 24 hr tablet Take 1 tablet (25 mg total) by mouth daily. 02/04/23   [name], MD  nicotine (NICODERM CQ - DOSED IN MG/24 HOURS) 14 mg/24hr patch Place 1 patch (14 mg total) onto the skin daily. Apply 21 mg patch daily x 6 wk, then 14mg  patch daily x 2 wk, then 7 mg patch daily x 2 wk 03/26/23   Lonie Peak, MD  nicotine (NICODERM CQ - DOSED IN MG/24 HOURS) 21 mg/24hr patch Place 1 patch (21 mg total) onto the skin daily. Apply 21 mg patch daily x 6 wk, then 14mg  patch daily x 2 wk, then 7 mg patch daily x 2 wk 03/26/23   Lonie Peak, MD  nicotine (NICODERM CQ - DOSED IN MG/24 HR) 7 mg/24hr patch Place 1 patch (7 mg total) onto the skin daily. Apply 21 mg patch daily x 6 wk, then 14mg  patch daily x 2 wk, then 7 mg patch daily x 2 wk 03/26/23   Lonie Peak, MD  ondansetron (ZOFRAN) 8 MG tablet Take 1 tablet (8 mg  total) by mouth every 8 (eight) hours as needed for nausea or vomiting. Start on the third day after cisplatin. 04/14/23   Pasam, Archie Patten, MD  prochlorperazine (COMPAZINE) 10 MG tablet TAKE 1 TABLET(10 MG) BY MOUTH EVERY 6 HOURS AS NEEDED FOR NAUSEA OR VOMITING 04/29/23   Pasam, Avinash, MD  QUEtiapine (SEROQUEL) 200 MG tablet Take 1 tablet (200 mg total) by mouth at bedtime. 05/02/23   Elsie Lincoln, MD  QUEtiapine (SEROQUEL) 50 MG tablet Take one pill nightly for 1 week. Then increase to 2 pills nightly for 1 week. Then increase to 3 pills nightly for one week. Then increase to 4 pills nightly for one week. 04/05/23   Elsie Lincoln, MD  scopolamine (TRANSDERM-SCOP) 1 MG/3DAYS Place 1 patch (1.5 mg total) onto the skin every 3 (three) days. 05/03/23   Lonie Peak, MD    Physical Exam: Vitals:   05/03/23 1700 05/03/23 1736 05/03/23 1927 05/03/23 2125  BP: 129/85   123/77  Pulse: 68 69  84  Resp: 19 19  15   Temp:  99.5 F (37.5 C) 99.7 F (37.6 C) 98.9 F (37.2 C)  TempSrc:  Oral Oral   SpO2: 90% 90%  94%  Weight:       Constitutional: Resting in bed with head elevated, appears in discomfort when sitting up Eyes: EOMI, lids and conjunctivae normal ENMT: Mucous membranes are dry. Posterior pharynx clear of any exudate or lesions.Normal dentition.  Neck: normal, supple, no masses. Respiratory: Coarse upper airway sounds, no wheezing. Normal respiratory effort. No accessory muscle use.  Cardiovascular: Regular rate and rhythm, no murmurs / rubs / gallops. No extremity edema. 2+ pedal pulses.  Port-A-Cath in place right chest wall, area without erythema, swelling, induration, or tenderness. Abdomen: Gastrostomy tube in place with purulent and bloody discharge on surrounding gauze.  Area of induration and erythema around insertion site, greater on the right.  Area is tender to palpation. Musculoskeletal: no clubbing / cyanosis. No joint deformity upper and lower extremities. Good ROM, no  contractures. Normal muscle tone.  Skin: nGastrostomy tube in place with purulent and bloody discharge on surrounding gauze.  Area of induration and erythema around insertion site, greater on the right.  Area is tender to palpation.  Neurologic: Sensation intact. Strength 5/5 in all 4.  Psychiatric: Normal judgment and insight. Alert and oriented x 3. Normal mood.    EKG: Not performed.  Assessment/Plan Principal Problem:   Skin infection at gastrostomy tube site [facility]) Active Problems:   Essential hypertension   Malignant neoplasm of supraglottis (HCC)   RSV infection   CHASKA HAGGER is a 53 y.o. 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  Assessment and Plan: Cellulitis of abdomen with infection at gastrostomy tube site: G-tube placed by IR on 04/28/2023, now complicated by G-tube site infection with surrounding abdominal wall cellulitis.  CT A/P without evidence of fluid collection or free air.  Evaluated by IR prior to admission. -Continue IV vancomycin and ceftriaxone -Obtain blood cultures -Hold tube feeds, placed on IV fluid hydration overnight  RSV positive: Mild URI symptoms.  CXR without evidence of superimposed pneumonia.  Continue supportive cares, supplemental oxygen as needed.  Supraglottic cancer: On active radiation and chemotherapy with cisplatin.  Follows with radiation oncology Dr. Basilio Cairo and medical oncology Dr. Arlana Pouch. -Continue lidocaine solution qid, scopolamine patch, Pepcid  Hypertension: BP is stable.  Holding antihypertensives for now.  Depression/anxiety: Continue Prozac, Seroquel, and Atarax as needed.    DVT prophylaxis: SCDs Start: 05/03/23 2000 Code Status: Full code, confirmed with  patient on admission Family Communication: Discussed with patient, he has discussed with family Disposition Plan: From home and likely discharge to home pending clinical progress Consults called: None Severity of Illness: The appropriate patient status for this patient is INPATIENT. Inpatient status is judged to be reasonable and necessary in order to provide the required intensity of service to ensure the patient's safety. The patient's presenting symptoms, physical exam findings, and initial radiographic and laboratory data in the context of their chronic comorbidities is felt to place them at high risk for further clinical deterioration. Furthermore, it is not anticipated that the patient will be medically stable for discharge from the hospital within 2 midnights of admission.   * I certify that at the point of admission it is my clinical judgment that the patient will require inpatient hospital care spanning beyond 2 midnights from the point of admission due to high intensity of service, high risk for further deterioration and high frequency of surveillance required.Darreld Mclean MD [facility]  If 7PM-7AM, please contact night-coverage [email or web address]  05/03/2023, 11:32 PM

## 2023-05-03 NOTE — ED Triage Notes (Signed)
Pt brought over from IR after dx with infection around G-Tube with abd distension. PT states symptoms started last Friday. Nausea noted, pt has congested productive cough reports secretions yellow color.

## 2023-05-03 NOTE — Progress Notes (Signed)
Pharmacy Antibiotic Note  [name] is a 53 y.o. male admitted on 05/03/2023 with purulent wound infection > abdominal wall cellulitis vs abscess around G tube. Pharmacy has been consulted for vancomycin dosing.  Today, 05/03/23 WBC WNL SCr WNL  Plan: Ceftriaxone 2 g IV q24h Vancomycin 1750 mg IV q24h for estimated AUC of 482.  Goal AUC 400-550. Check levels as needed Monitor renal function, culture data  Weight: 66.3 kg (146 lb 4 oz)  Temp (24hrs), Avg:99.6 F (37.6 C), Min:99.5 F (37.5 C), Max:99.7 F (37.6 C)  Recent Labs  Lab 04/28/23 0805 04/30/23 1425 05/03/23 1352 05/03/23 1833  WBC 4.4 7.0 8.9  --   CREATININE 0.87 1.08 0.82  --   LATICACIDVEN  --   --   --  0.5    Estimated Creatinine Clearance: 86.2 mL/min (by C-G formula based on SCr of 0.82 mg/dL).    No Known Allergies  Antimicrobials this admission: vancomycin 2/3 >>  ceftriaxone 2/3 >>   Dose adjustments this admission:  Microbiology results: 2/3 BCx:  RSV+  Cindi Carbon, PharmD 05/03/2023 8:21 PM

## 2023-05-04 ENCOUNTER — Inpatient Hospital Stay: Payer: Medicare HMO

## 2023-05-04 ENCOUNTER — Other Ambulatory Visit: Payer: Self-pay

## 2023-05-04 ENCOUNTER — Ambulatory Visit
Admission: RE | Admit: 2023-05-04 | Discharge: 2023-05-04 | Disposition: A | Discharge status: Home / Self Care | Payer: Medicare HMO | Source: Ambulatory Visit | Attending: Radiation Oncology | Admitting: Radiation Oncology

## 2023-05-04 DIAGNOSIS — Z510 Encounter for antineoplastic radiation therapy: Secondary | ICD-10-CM | POA: Diagnosis not present

## 2023-05-04 DIAGNOSIS — L089 Local infection of the skin and subcutaneous tissue, unspecified: Secondary | ICD-10-CM | POA: Diagnosis not present

## 2023-05-04 DIAGNOSIS — C321 Malignant neoplasm of supraglottis: Secondary | ICD-10-CM | POA: Diagnosis not present

## 2023-05-04 DIAGNOSIS — K9422 Gastrostomy infection: Secondary | ICD-10-CM | POA: Diagnosis not present

## 2023-05-04 LAB — RAD ONC ARIA SESSION SUMMARY
Course Elapsed Days: 20
Plan Fractions Treated to Date: 15
Plan Prescribed Dose Per Fraction: 2 Gy
Plan Total Fractions Prescribed: 35
Plan Total Prescribed Dose: 70 Gy
Reference Point Dosage Given to Date: 30 Gy
Reference Point Session Dosage Given: 2 Gy
Session Number: 15

## 2023-05-04 LAB — BASIC METABOLIC PANEL
Anion gap: 11 (ref 5–15)
BUN: 13 mg/dL (ref 6–20)
CO2: 25 mmol/L (ref 22–32)
Calcium: 8.8 mg/dL — ABNORMAL LOW (ref 8.9–10.3)
Chloride: 99 mmol/L (ref 98–111)
Creatinine, Ser: 1.03 mg/dL (ref 0.61–1.24)
GFR, Estimated: >60 mL/min (ref >60)
Glucose, Bld: 98 mg/dL (ref 70–99)
Potassium: 4.4 mmol/L (ref 3.5–5.1)
Sodium: 135 mmol/L (ref 135–145)

## 2023-05-04 LAB — CBC
HCT: 38.7 % — ABNORMAL LOW (ref 39.0–52.0)
Hemoglobin: 12.8 g/dL — ABNORMAL LOW (ref 13.0–17.0)
MCH: 30.5 pg (ref 26.0–34.0)
MCHC: 33.1 g/dL (ref 30.0–36.0)
MCV: 92.1 fL (ref 80.0–100.0)
Platelets: 168 10*3/uL (ref 150–400)
RBC: 4.2 MIL/uL — ABNORMAL LOW (ref 4.22–5.81)
RDW: 13.2 % (ref 11.5–15.5)
WBC: 7.9 10*3/uL (ref 4.0–10.5)
nRBC: 0 % (ref 0.0–0.2)

## 2023-05-04 LAB — GLUCOSE, CAPILLARY
Glucose-Capillary: 124 mg/dL — ABNORMAL HIGH (ref 70–99)
Glucose-Capillary: 99 mg/dL (ref 70–99)

## 2023-05-04 LAB — LIPASE, BLOOD: Lipase: 30 U/L (ref 11–51)

## 2023-05-04 MED ORDER — PHENOL 1.4 % MT LIQD
1.0000 | OROMUCOSAL | Status: DC | PRN
  Administered 2023-05-04: 1 via OROMUCOSAL
  Filled 2023-05-04: qty 177

## 2023-05-04 MED ORDER — TRAZODONE HCL 50 MG PO TABS
50.0000 mg | ORAL_TABLET | Freq: Every evening | ORAL | Status: DC | PRN
  Administered 2023-05-06: 50 mg via ORAL
  Filled 2023-05-04: qty 1

## 2023-05-04 MED ORDER — METOPROLOL TARTRATE 5 MG/5ML IV SOLN: 5.0000 mg | INTRAVENOUS | Status: DC | PRN

## 2023-05-04 MED ORDER — CHLORHEXIDINE GLUCONATE CLOTH 2 % EX PADS
6.0000 | MEDICATED_PAD | Freq: Every day | CUTANEOUS | Status: DC
  Administered 2023-05-04 – 2023-05-07 (×4): 6 via TOPICAL

## 2023-05-04 MED ORDER — IPRATROPIUM-ALBUTEROL 0.5-2.5 (3) MG/3ML IN SOLN: 3.0000 mL | RESPIRATORY_TRACT | Status: DC | PRN

## 2023-05-04 MED ORDER — SODIUM CHLORIDE 0.9% FLUSH
10.0000 mL | INTRAVENOUS | Status: DC | PRN
  Administered 2023-05-05: 10 mL

## 2023-05-04 MED ORDER — VANCOMYCIN HCL 1500 MG/300ML IV SOLN
1500.0000 mg | INTRAVENOUS | Status: DC
Start: 2023-05-05 — End: 2023-05-05
  Administered 2023-05-05: 1500 mg via INTRAVENOUS
  Filled 2023-05-04: qty 300

## 2023-05-04 MED ORDER — OSMOLITE 1.2 CAL PO LIQD
1000.0000 mL | ORAL | Status: DC
  Administered 2023-05-04: 1000 mL
  Filled 2023-05-04 (×3): qty 1000

## 2023-05-04 MED ORDER — HYDRALAZINE HCL 20 MG/ML IJ SOLN: 10.0000 mg | INTRAMUSCULAR | Status: DC | PRN

## 2023-05-04 NOTE — Progress Notes (Signed)
 Pharmacy Antibiotic Note  [name] is a 53 y.o. male admitted on 05/03/2023 with purulent infected G-tube insertion site. Pharmacy has been consulted for vancomycin  dosing.  Today, 05/04/23 WBC WNL SCr slightly increased  Plan: Ceftriaxone  2 g IV q24h Reduce vancomycin  dose to 1500 mg IV q24h for estimated AUC of 514.  Goal AUC 400-550. Check levels as needed Monitor renal function, culture data  Weight: 66.3 kg (146 lb 4 oz)  Temp (24hrs), Avg:99 F (37.2 C), Min:98.3 F (36.8 C), Max:99.7 F (37.6 C)  Recent Labs  Lab 04/28/23 0805 04/30/23 1425 05/03/23 1352 05/03/23 1833 05/04/23 0557 05/04/23 0558  WBC 4.4 7.0 8.9  --  7.9  --   CREATININE 0.87 1.08 0.82  --   --  1.03  LATICACIDVEN  --   --   --  0.5  --   --     Estimated Creatinine Clearance: 68.7 mL/min (by C-G formula based on SCr of 1.03 mg/dL).    No Known Allergies  Antimicrobials this admission: vancomycin  2/3 >>  ceftriaxone  2/3 >>   Dose adjustments this admission:  Microbiology results: 2/3 BCx: ngtd RSV+  Ronal CHRISTELLA Rav, PharmD 05/04/2023 10:06 AM

## 2023-05-04 NOTE — Progress Notes (Signed)
 PROGRESS NOTE    [name]  FMW:[id number] DOB: 08-Sep-1970 DOA: 05/03/2023 PCP: Tobie Suzzane POUR, MD    Brief Narrative:   53 y.o. 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   Assessment & Plan:  Principal Problem:   Skin infection at gastrostomy tube site [facility]) Active Problems:   Essential hypertension   Malignant neoplasm of supraglottis (HCC)   RSV infection   Cellulitis of abdomen with infection at gastrostomy tube site: G-tube placed by IR on 04/28/2023, now complicated by G-tube site infection with surrounding abdominal wall cellulitis.  CT A/P without evidence of fluid collection or free air.  Evaluated by IR prior to admission. Cont IV Abx, follow cultures.    RSV positive: Mild URI symptoms.  CXR without evidence of superimposed pneumonia.  IS/ bronchodilators.    Supraglottic cancer: On active radiation and chemotherapy with cisplatin .  Follows with radiation oncology Dr. Izell and medical oncology Dr. Autumn. -Continue lidocaine  solution qid, scopolamine  patch, Pepcid    Hypertension: BP is stable.  Holding antihypertensives for now. IV prn   Depression/anxiety: Continue Prozac , Seroquel , and Atarax  as needed.   DVT prophylaxis: SCDs Start: 05/03/23 2000    Code Status: Full Code Family Communication:   Status is: Inpatient Remains inpatient appropriate because: Hosp stay for IV Abx.     Subjective: Doing ok NO new complaints  Tolerated his Radiation this morning.    Examination:  General exam: Appears calm and comfortable  Respiratory system: Clear to auscultation. Respiratory effort normal. Cardiovascular system: S1 & S2 heard, RRR. No JVD, murmurs, rubs, gallops or clicks. No pedal  edema. Gastrointestinal system: Abdomen is nondistended, soft and nontender. No organomegaly or masses felt. Normal bowel sounds heard. Central nervous system: Alert and oriented. No focal neurological deficits. Extremities: Symmetric 5 x 5 power. Skin: Peg tube with surrounding erythema.  Psychiatry: Judgement and insight appear normal. Mood & affect appropriate.                Diet Orders (From admission, onward)     Start     Ordered   05/04/23 0001  Diet NPO time specified Except for: Ice Chips, Sips with Meds  Diet effective midnight       Question Answer Comment  Except for Ice Chips   Except for Sips with Meds      05/03/23 1952            Objective: Vitals:   05/04/23 0500 05/04/23 0530 05/04/23 0800 05/04/23 0940  BP: 128/81 (!) 138/115  128/77  Pulse: 65 62  65  Resp: 12 13  16   Temp:  98.6 F (37 C) 98.3 F (36.8 C) 98.5 F (36.9 C)  TempSrc:   Oral Oral  SpO2: 96% 93%  93%  Weight:        Intake/Output Summary (Last 24 hours) at 05/04/2023 1132 Last data filed at 05/04/2023 0830 Gross per 24 hour  Intake 1587.48 ml  Output 0 ml  Net 1587.48 ml   Filed Weights   05/03/23 1306  Weight: 66.3 kg    Scheduled Meds:  famotidine   40 mg Oral Daily   FLUoxetine   20 mg Oral Daily   QUEtiapine   50 mg Oral QHS   scopolamine   1 patch Transdermal Q72H   Continuous Infusions:  cefTRIAXone  (ROCEPHIN )  IV 2 g (05/03/23 2238)   [START ON 05/05/2023] vancomycin       Nutritional status     Body mass index is 25.1 kg/m.  Data Reviewed:   CBC: Recent Labs  Lab 04/28/23 0805 04/30/23 1425 05/03/23 1352 05/04/23 0557  WBC 4.4 7.0 8.9 7.9  NEUTROABS 2.6 5.4 7.1  --   HGB 13.5 13.0 13.3 12.8*  HCT 42.0 39.2 40.9 38.7*  MCV 91.9 87.7 91.3 92.1  PLT 156 122* 170 168   Basic Metabolic Panel: Recent Labs  Lab 04/28/23 0805 04/30/23 1425 05/03/23 1352 05/04/23 0558  NA 139 137 135 135  K 4.0 4.0 4.3 4.4  CL 102 101 99 99  CO2 26 29 27 25    GLUCOSE 92 108* 101* 98  BUN 13 15 11 13   CREATININE 0.87 1.08 0.82 1.03  CALCIUM 9.1 9.0 9.0 8.8*  MG  --  2.1  --   --    GFR: Estimated Creatinine Clearance: 68.7 mL/min (by C-G formula based on SCr of 1.03 mg/dL). Liver Function Tests: Recent Labs  Lab 05/03/23 1352  AST 14*  ALT 13  ALKPHOS 49  BILITOT 0.8  PROT 8.0  ALBUMIN 3.6   Recent Labs  Lab 05/04/23 0558  LIPASE 30   No results for input(s): AMMONIA in the last 168 hours. Coagulation Profile: Recent Labs  Lab 04/28/23 0805  INR 0.9   Cardiac Enzymes: No results for input(s): CKTOTAL, CKMB, CKMBINDEX, TROPONINI in the last 168 hours. BNP (last 3 results) No results for input(s): PROBNP in the last 8760 hours. HbA1C: No results for input(s): HGBA1C in the last 72 hours. CBG: No results for input(s): GLUCAP in the last 168 hours. Lipid Profile: No results for input(s): CHOL, HDL, LDLCALC, TRIG, CHOLHDL, LDLDIRECT in the last 72 hours. Thyroid  Function Tests: No results for input(s): TSH, T4TOTAL, FREET4, T3FREE, THYROIDAB in the last 72 hours. Anemia Panel: No results for input(s): VITAMINB12, FOLATE, FERRITIN, TIBC, IRON, RETICCTPCT in the last 72 hours. Sepsis Labs: Recent Labs  Lab 05/03/23 1833  LATICACIDVEN 0.5    Recent Results (from the past 240 hours)  Blood culture (routine x 2)     Status: None (Preliminary result)   Collection Time: 05/03/23  1:52 PM   Specimen: BLOOD  Result Value Ref Range Status   Specimen Description   Final    BLOOD LEFT ANTECUBITAL Performed at [facility], [address], [place], [place] [address]    Special Requests   Final    BOTTLES DRAWN AEROBIC AND ANAEROBIC Blood Culture adequate volume Performed at [facility], [address], [place], [place] [address]    Culture   Final    NO GROWTH < 12 HOURS Performed at [facility], [address]  [place], [place] [address]    Report Status PENDING  Incomplete  Resp panel by RT-PCR (RSV, Flu A&B, Covid) Anterior Nasal Swab     Status: Abnormal   Collection Time: 05/03/23  2:18 PM   Specimen: Anterior Nasal Swab  Result Value Ref Range Status   SARS Coronavirus 2 by RT PCR NEGATIVE NEGATIVE Final    Comment: (NOTE) SARS-CoV-2 target nucleic acids are NOT DETECTED.  The SARS-CoV-2 RNA is generally detectable in upper respiratory specimens during the acute phase of infection. The lowest concentration of SARS-CoV-2 viral copies this assay can detect is 138 copies/mL. A negative result does not preclude SARS-Cov-2 infection and should not be  used as the sole basis for treatment or other patient management decisions. A negative result may occur with  improper specimen collection/handling, submission of specimen other than nasopharyngeal swab, presence of viral mutation(s) within the areas targeted by this assay, and inadequate number of viral copies(<138 copies/mL). A negative result must be combined with clinical observations, patient history, and epidemiological information. The expected result is Negative.  Fact Sheet for Patients:  bloggercourse.com  Fact Sheet for Healthcare Providers:  seriousbroker.it  This test is no t yet approved or cleared by the [place]  FDA and  has been authorized for detection and/or diagnosis of SARS-CoV-2 by FDA under an Emergency Use Authorization (EUA). This EUA will remain  in effect (meaning this test can be used) for the duration of the COVID-19 declaration under Section 564(b)(1) of the Act, 21 U.S.C.section 360bbb-3(b)(1), unless the authorization is terminated  or revoked sooner.       Influenza A by PCR NEGATIVE NEGATIVE Final   Influenza B by PCR NEGATIVE NEGATIVE Final    Comment: (NOTE) The Xpert Xpress SARS-CoV-2/FLU/RSV plus assay is intended as an aid in the diagnosis of  influenza from Nasopharyngeal swab specimens and should not be used as a sole basis for treatment. Nasal washings and aspirates are unacceptable for Xpert Xpress SARS-CoV-2/FLU/RSV testing.  Fact Sheet for Patients: bloggercourse.com  Fact Sheet for Healthcare Providers: seriousbroker.it  This test is not yet approved or cleared by the [place]  FDA and has been authorized for detection and/or diagnosis of SARS-CoV-2 by FDA under an Emergency Use Authorization (EUA). This EUA will remain in effect (meaning this test can be used) for the duration of the COVID-19 declaration under Section 564(b)(1) of the Act, 21 U.S.C. section 360bbb-3(b)(1), unless the authorization is terminated or revoked.     Resp Syncytial Virus by PCR POSITIVE (A) NEGATIVE Final    Comment: (NOTE) Fact Sheet for Patients: bloggercourse.com  Fact Sheet for Healthcare Providers: seriousbroker.it  This test is not yet approved or cleared by the [place]  FDA and has been authorized for detection and/or diagnosis of SARS-CoV-2 by FDA under an Emergency Use Authorization (EUA). This EUA will remain in effect (meaning this test can be used) for the duration of the COVID-19 declaration under Section 564(b)(1) of the Act, 21 U.S.C. section 360bbb-3(b)(1), unless the authorization is terminated or revoked.  Performed at [facility], [address] [place], [place] [address]   Blood culture (routine x 2)     Status: None (Preliminary result)   Collection Time: 05/04/23  5:58 AM   Specimen: BLOOD  Result Value Ref Range Status   Specimen Description   Final    BLOOD SITE NOT SPECIFIED Performed at [facility], [address], [place], [place] [address]    Special Requests   Final    BOTTLES DRAWN AEROBIC AND ANAEROBIC Blood Culture adequate volume Performed at [facility], [address] [place], [place] [address]    Culture PENDING  Incomplete   Report Status PENDING  Incomplete         Radiology Studies: CT ABDOMEN PELVIS W CONTRAST Result Date: 05/03/2023 CLINICAL DATA:  Peritonitis or perforation suspected. Abdominal pain. Nausea. Gastrostomy tube insertion. EXAM: CT ABDOMEN AND PELVIS WITH CONTRAST TECHNIQUE: Multidetector CT imaging of the abdomen and pelvis was performed using the standard protocol following bolus administration of intravenous contrast. RADIATION DOSE REDUCTION: This exam was performed according to the departmental dose-optimization program which includes automated exposure  control, adjustment of the mA and/or kV according to patient size and/or use of iterative reconstruction technique. CONTRAST:  OMNIPAQUE  IOHEXOL  300 MG/ML  SOLN COMPARISON:  PET-CT 03/23/2023. FINDINGS: Lower chest: No acute abnormality. Hepatobiliary: No focal liver abnormality is seen. No gallstones, gallbladder wall thickening, or biliary dilatation. Pancreas: Unremarkable. No pancreatic ductal dilatation or surrounding inflammatory changes. Spleen: Normal in size without focal abnormality. Adrenals/Urinary Tract: Subcentimeter rounded hypodensities in the left kidney are too small to characterize, likely cysts. Otherwise, the adrenal glands, kidneys and bladder are within normal limits. Stomach/Bowel: There is a new percutaneous gastrostomy tube in the body of the stomach. There is mild stranding surrounding the stomach at the level of the gastrostomy tube without fluid collection or free air. The stomach otherwise appears within normal limits. Appendix is not seen. No evidence of other bowel wall thickening, distention, or inflammatory changes. Vascular/Lymphatic: Aortic atherosclerosis. No enlarged abdominal or pelvic lymph nodes. Reproductive: Prostate is unremarkable. Other: There is a small fat containing umbilical hernia. There is no free  fluid or free air. There some subcutaneous and intramuscular edema/stranding at the level of the gastrostomy. No drainable fluid collections are seen. No soft tissue gas. Musculoskeletal: No acute or significant osseous findings. IMPRESSION: 1. New percutaneous gastrostomy tube in the body of the stomach. There is mild stranding surrounding the stomach at the level of the gastrostomy tube without fluid collection or free air. 2. Subcutaneous and intramuscular edema/stranding at the level of the gastrostomy tube. No drainable fluid collections. 3. No other acute localizing process in the abdomen or pelvis. Aortic Atherosclerosis (ICD10-I70.0). Electronically Signed   By: Greig Pique M.D.   On: 05/03/2023 17:35   DG Chest 2 View Result Date: 05/03/2023 CLINICAL DATA:  Productive cough.  Laryngeal carcinoma. EXAM: CHEST - 2 VIEW COMPARISON:  None Available. FINDINGS: The heart size and mediastinal contours are within normal limits. Right-sided Port-A-Cath is seen in appropriate position. Both lungs are clear. The visualized skeletal structures are unremarkable. IMPRESSION: No active cardiopulmonary disease. Electronically Signed   By: Norleen DELENA Kil M.D.   On: 05/03/2023 15:36   IR PATIENT EVAL TECH 0-60 MINS Result Date: 05/03/2023 Katha Harlene ORN, RT     05/03/2023  2:21 PM Patient presented to IR today with complaints of redness, tenderness, and swelling around newly placed gastrostomy tube site.  Gastrostomy tube bumper appeared to be tight to skin, likely due to swelling.  After bumper was loosened, pus came out from around the tube site. At this time, S. Neita, [place] consulted D. Johann, MD, who advised patient to go to Emergency Department to be treated for infection.           LOS: 1 day   Time spent= 35 mins    Burgess JAYSON Dare, MD [facility]  If 7PM-7AM, please contact night-coverage  05/04/2023, 11:32 AM

## 2023-05-04 NOTE — Progress Notes (Signed)
 Patient s/p pull through gastrostomy placement 04/28/23 in IR who presented to the cancer center today for radiation check in appointment where he reported redness, extreme tenderness and firmness at the G tube insertion site. He reports fevers at home (per wife temp up to 103 this weekend), alternating chills/sweating, nausea, vomiting and general malaise for the last 2 or so days.  Site assessed in IR yesterday- significant induration surrounding insertion site which is extremely tender to touch, warm and red. The bumper was noted to be quite tight, likely due to swelling, and when the bumper was loosened significant amount of pus began to flow from the site.  Pt was sent to ER for further evaluation, imaging, IV abx, etc.  Pt still in ER this am awaiting bed. CT performed shows G-tube in appropriate position but surrounding stranding and edema at the G-tube site, but no abscess or fluid collection.  BP (!) 138/115   Pulse 62   Temp 98.3 F (36.8 C) (Oral)   Resp 13   Wt 146 lb 4 oz (66.3 kg)   SpO2 93%   BMI 25.10 kg/m   G-tube intact. Surrounding site remains edematous and erythematous. Some purulent drainage on gauze at insertion site.   S/p G-tube 1/29 Site infection/cellulitis. Cont IV abx. Single layer gauze under bumper to keep drainage off skin. Okay to use G-tube for meds/flush/TF IR will cont to follow while inpt.   Franky Rusk PA-C Interventional Radiology 05/04/2023 9:33 AM

## 2023-05-04 NOTE — Plan of Care (Signed)

## 2023-05-05 ENCOUNTER — Inpatient Hospital Stay: Payer: Medicare HMO

## 2023-05-05 ENCOUNTER — Encounter: Payer: Self-pay | Admitting: Oncology

## 2023-05-05 ENCOUNTER — Telehealth: Payer: Self-pay | Admitting: *Deleted

## 2023-05-05 ENCOUNTER — Inpatient Hospital Stay: Payer: Medicare HMO | Admitting: Dietician

## 2023-05-05 ENCOUNTER — Ambulatory Visit
Admission: RE | Admit: 2023-05-05 | Discharge: 2023-05-05 | Disposition: A | Discharge status: Home / Self Care | Payer: Medicare HMO | Source: Ambulatory Visit | Attending: Radiation Oncology | Admitting: Radiation Oncology

## 2023-05-05 ENCOUNTER — Other Ambulatory Visit: Payer: Self-pay

## 2023-05-05 DIAGNOSIS — Z510 Encounter for antineoplastic radiation therapy: Secondary | ICD-10-CM | POA: Diagnosis not present

## 2023-05-05 DIAGNOSIS — C321 Malignant neoplasm of supraglottis: Secondary | ICD-10-CM | POA: Diagnosis not present

## 2023-05-05 DIAGNOSIS — K9422 Gastrostomy infection: Secondary | ICD-10-CM | POA: Diagnosis not present

## 2023-05-05 DIAGNOSIS — L089 Local infection of the skin and subcutaneous tissue, unspecified: Secondary | ICD-10-CM | POA: Diagnosis not present

## 2023-05-05 LAB — GLUCOSE, CAPILLARY
Glucose-Capillary: 114 mg/dL — ABNORMAL HIGH (ref 70–99)
Glucose-Capillary: 147 mg/dL — ABNORMAL HIGH (ref 70–99)
Glucose-Capillary: 112 mg/dL — ABNORMAL HIGH (ref 70–99)
Glucose-Capillary: 112 mg/dL — ABNORMAL HIGH (ref 70–99)
Glucose-Capillary: 97 mg/dL (ref 70–99)
Glucose-Capillary: 115 mg/dL — ABNORMAL HIGH (ref 70–99)

## 2023-05-05 LAB — CBC
HCT: 37.7 % — ABNORMAL LOW (ref 39.0–52.0)
Hemoglobin: 12 g/dL — ABNORMAL LOW (ref 13.0–17.0)
MCH: 29.1 pg (ref 26.0–34.0)
MCHC: 31.8 g/dL (ref 30.0–36.0)
MCV: 91.5 fL (ref 80.0–100.0)
Platelets: 213 10*3/uL (ref 150–400)
RBC: 4.12 MIL/uL — ABNORMAL LOW (ref 4.22–5.81)
RDW: 13 % (ref 11.5–15.5)
WBC: 5 10*3/uL (ref 4.0–10.5)
nRBC: 0 % (ref 0.0–0.2)

## 2023-05-05 LAB — BASIC METABOLIC PANEL
Anion gap: 10 (ref 5–15)
BUN: 12 mg/dL (ref 6–20)
CO2: 27 mmol/L (ref 22–32)
Calcium: 8.7 mg/dL — ABNORMAL LOW (ref 8.9–10.3)
Chloride: 102 mmol/L (ref 98–111)
Creatinine, Ser: 0.63 mg/dL (ref 0.61–1.24)
GFR, Estimated: >60 mL/min (ref >60)
Glucose, Bld: 127 mg/dL — ABNORMAL HIGH (ref 70–99)
Potassium: 3.8 mmol/L (ref 3.5–5.1)
Sodium: 139 mmol/L (ref 135–145)

## 2023-05-05 LAB — RAD ONC ARIA SESSION SUMMARY
Course Elapsed Days: 21
Plan Fractions Treated to Date: 16
Plan Prescribed Dose Per Fraction: 2 Gy
Plan Total Fractions Prescribed: 35
Plan Total Prescribed Dose: 70 Gy
Reference Point Dosage Given to Date: 32 Gy
Reference Point Session Dosage Given: 2 Gy
Session Number: 16

## 2023-05-05 LAB — PHOSPHORUS
Phosphorus: 4 mg/dL (ref 2.5–4.6)
Phosphorus: 3.7 mg/dL (ref 2.5–4.6)

## 2023-05-05 LAB — MAGNESIUM
Magnesium: 2.4 mg/dL (ref 1.7–2.4)
Magnesium: 2.1 mg/dL (ref 1.7–2.4)

## 2023-05-05 MED ORDER — OSMOLITE 1.5 CAL PO LIQD
1000.0000 mL | ORAL | Status: DC
  Administered 2023-05-05 – 2023-05-06 (×2): 1000 mL
  Filled 2023-05-05 (×4): qty 1000

## 2023-05-05 MED ORDER — THIAMINE MONONITRATE 100 MG PO TABS
100.0000 mg | ORAL_TABLET | Freq: Every day | ORAL | Status: DC
  Administered 2023-05-05 – 2023-05-07 (×3): 100 mg via ORAL
  Filled 2023-05-05 (×3): qty 1

## 2023-05-05 MED ORDER — FREE WATER
150.0000 mL | Status: DC
  Administered 2023-05-05 – 2023-05-07 (×13): 150 mL

## 2023-05-05 MED ORDER — PROSOURCE TF20 ENFIT COMPATIBL EN LIQD
60.0000 mL | Freq: Every day | ENTERAL | Status: DC
  Administered 2023-05-05 – 2023-05-07 (×3): 60 mL
  Filled 2023-05-05 (×3): qty 60

## 2023-05-05 MED ORDER — VANCOMYCIN HCL IN DEXTROSE 1-5 GM/200ML-% IV SOLN
1000.0000 mg | Freq: Two times a day (BID) | INTRAVENOUS | Status: DC
  Administered 2023-05-06: 1000 mg via INTRAVENOUS
  Filled 2023-05-05: qty 200

## 2023-05-05 NOTE — Telephone Encounter (Signed)
 [name] [phone]), I'm at work and missed your call.  Never done this before.  Did not know I had to sign the form I found online.  I need an intermittent leave.  The start date can be Monday 05/03/2023 when he was admitted.  Had several test earlier but I won't use these dates.  Fax form to my employer Fax No.#: [phone], to my attention and I will deliver to my supervisor.*  Advised she will need to review in six months.  Denies further questions or needs.  Faxed [employer] to her attention to obtain signature when she visits patient.

## 2023-05-05 NOTE — Telephone Encounter (Signed)
 FMLA form received for [name]'s spouse [name].  Form completed for provider review, signature and return to this nurse.   Today message left for [name] [phone]) to call this nurse directly.regarding [employer] to be signed by [name] currently admitted and details of requested 8-week leave, start date, type and return fax number for employer [employer] [place].

## 2023-05-05 NOTE — Progress Notes (Signed)
 Pharmacy Antibiotic Note  [name] is a 53 y.o. male admitted on 05/03/2023 with  G tube site infection, cellulitis .  Pharmacy has been consulted for Vanco dosing.  ID: Purulent infection G-tube site . RSV + Tmax 99, WBC 5, Scr now <1  Vanc 2/4 > CTX 2/4 >  Dose adjustment: -2/4: Vanc 1750 q24h > 1500 q24h - 2/5: Vanco 1500mg /24h> 1g/12 (AUC 540, Scr 0.8)  2/4: BC x 2>> 2/4: RSV +  Plan: Increase -Vanc 1000mg  IV q12 hrs (AUC 540, Scr 0.8) starting 2/6.  Height: 5' 4 (162.6 cm) Weight: 66.1 kg (145 lb 11.6 oz) IBW/kg (Calculated) : 59.2  Temp (24hrs), Avg:98.6 F (37 C), Min:98.3 F (36.8 C), Max:99 F (37.2 C)  Recent Labs  Lab 04/30/23 1425 05/03/23 1352 05/03/23 1833 05/04/23 0557 05/04/23 0558 05/05/23 0007  WBC 7.0 8.9  --  7.9  --  5.0  CREATININE 1.08 0.82  --   --  1.03 0.63  LATICACIDVEN  --   --  0.5  --   --   --     Estimated Creatinine Clearance: 88.4 mL/min (by C-G formula based on SCr of 0.63 mg/dL).    No Known Allergies  [name], PharmD, BCPS Clinical Staff Pharmacist  [name] 05/05/2023 9:06 AM

## 2023-05-05 NOTE — Progress Notes (Signed)
 PROGRESS NOTE    [name]  FMW:[id number] DOB: January 04, 1971 DOA: 05/03/2023 PCP: Tobie Suzzane POUR, MD    Brief Narrative:   53 y.o. 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   Assessment & Plan:  Principal Problem:   Skin infection at gastrostomy tube site [facility]) Active Problems:   Essential hypertension   Malignant neoplasm of supraglottis (HCC)   RSV infection   Cellulitis of abdomen with infection at gastrostomy tube site: G-tube placed by IR on 04/28/2023, now complicated by G-tube site infection with surrounding abdominal wall cellulitis.  CT A/P without evidence of fluid collection or free air.  Evaluated by IR prior to admission. Cont IV Abx, follow cultures.  Appears to be slowly improving - Dietitian following   RSV positive: Mild URI symptoms.  CXR without evidence of superimposed pneumonia.  IS/ bronchodilators.    Supraglottic cancer: On active radiation and chemotherapy with cisplatin .  Follows with radiation oncology Dr. Izell and medical oncology Dr. Autumn. -Continue lidocaine  solution qid, scopolamine  patch, Pepcid    Hypertension: BP is stable.  Holding antihypertensives for now. IV prn   Depression/anxiety: Continue Prozac , Seroquel , and Atarax  as needed.   DVT prophylaxis: SCDs Start: 05/03/23 2000    Code Status: Full Code Family Communication:  Psychiatrist.  Status is: Inpatient Remains inpatient appropriate because: Hosp stay for IV Abx.     Subjective: Doing okay, abdominal pain is improving.  Examination:  General exam: Appears calm and comfortable  Respiratory system: Clear to auscultation. Respiratory effort normal. Cardiovascular system: S1 & S2 heard, RRR. No JVD,  murmurs, rubs, gallops or clicks. No pedal edema. Gastrointestinal system: Abdomen is nondistended, soft and nontender. No organomegaly or masses felt. Normal bowel sounds heard. Central nervous system: Alert and oriented. No focal neurological deficits. Extremities: Symmetric 5 x 5 power. Skin: Peg tube with surrounding erythema.  Improved Psychiatry: Judgement and insight appear normal. Mood & affect appropriate.                Diet Orders (From admission, onward)     Start     Ordered   05/04/23 0001  Diet NPO time specified Except for: Ice Chips, Sips with Meds  Diet effective midnight       Question Answer Comment  Except for Ice Chips   Except for Sips with Meds      05/03/23 1952            Objective: Vitals:   05/04/23 1733 05/04/23 2149 05/05/23 0221 05/05/23 0555  BP: (!) 143/83 138/80 133/79 130/80  Pulse: 72 61 73 67  Resp: 18 18 18 18   Temp: 98.5 F (36.9 C) 98.6 F (37 C) 98.6 F (37 C) 99 F (37.2 C)  TempSrc: Oral Oral Oral Oral  SpO2: 100% 96% 96% 98%  Weight:      Height:        Intake/Output Summary (Last 24 hours) at 05/05/2023 1153 Last data filed at 05/05/2023 0649 Gross per 24 hour  Intake 840 ml  Output 0 ml  Net 840 ml   Filed Weights   05/03/23 1306 05/04/23 1344  Weight: 66.3 kg 66.1 kg    Scheduled Meds:  Chlorhexidine  Gluconate Cloth  6 each Topical Daily   famotidine   40 mg Oral Daily  feeding supplement (PROSource TF20)  60 mL Per Tube Daily   FLUoxetine   20 mg Oral Daily   QUEtiapine   50 mg Oral QHS   scopolamine   1 patch Transdermal Q72H   thiamine   100 mg Oral Daily   Continuous Infusions:  cefTRIAXone  (ROCEPHIN )  IV 2 g (05/04/23 2011)   feeding supplement (OSMOLITE 1.5 CAL) 1,000 mL (05/05/23 1058)   [START ON 05/06/2023] vancomycin       Nutritional status     Body mass index is 25.01 kg/m.  Data Reviewed:   CBC: Recent Labs  Lab 04/30/23 1425 05/03/23 1352 05/04/23 0557 05/05/23 0007  WBC 7.0  8.9 7.9 5.0  NEUTROABS 5.4 7.1  --   --   HGB 13.0 13.3 12.8* 12.0*  HCT 39.2 40.9 38.7* 37.7*  MCV 87.7 91.3 92.1 91.5  PLT 122* 170 168 213   Basic Metabolic Panel: Recent Labs  Lab 04/30/23 1425 05/03/23 1352 05/04/23 0558 05/05/23 0007  NA 137 135 135 139  K 4.0 4.3 4.4 3.8  CL 101 99 99 102  CO2 29 27 25 27   GLUCOSE 108* 101* 98 127*  BUN 15 11 13 12   CREATININE 1.08 0.82 1.03 0.63  CALCIUM 9.0 9.0 8.8* 8.7*  MG 2.1  --   --  2.4  PHOS  --   --   --  4.0   GFR: Estimated Creatinine Clearance: 88.4 mL/min (by C-G formula based on SCr of 0.63 mg/dL). Liver Function Tests: Recent Labs  Lab 05/03/23 1352  AST 14*  ALT 13  ALKPHOS 49  BILITOT 0.8  PROT 8.0  ALBUMIN 3.6   Recent Labs  Lab 05/04/23 0558  LIPASE 30   No results for input(s): AMMONIA in the last 168 hours. Coagulation Profile: No results for input(s): INR, PROTIME in the last 168 hours. Cardiac Enzymes: No results for input(s): CKTOTAL, CKMB, CKMBINDEX, TROPONINI in the last 168 hours. BNP (last 3 results) No results for input(s): PROBNP in the last 8760 hours. HbA1C: No results for input(s): HGBA1C in the last 72 hours. CBG: Recent Labs  Lab 05/04/23 1957 05/04/23 2313 05/05/23 0421 05/05/23 0748  GLUCAP 124* 99 114* 147*   Lipid Profile: No results for input(s): CHOL, HDL, LDLCALC, TRIG, CHOLHDL, LDLDIRECT in the last 72 hours. Thyroid  Function Tests: No results for input(s): TSH, T4TOTAL, FREET4, T3FREE, THYROIDAB in the last 72 hours. Anemia Panel: No results for input(s): VITAMINB12, FOLATE, FERRITIN, TIBC, IRON, RETICCTPCT in the last 72 hours. Sepsis Labs: Recent Labs  Lab 05/03/23 1833  LATICACIDVEN 0.5    Recent Results (from the past 240 hours)  Blood culture (routine x 2)     Status: None (Preliminary result)   Collection Time: 05/03/23  1:52 PM   Specimen: BLOOD  Result Value Ref Range Status   Specimen  Description   Final    BLOOD LEFT ANTECUBITAL Performed at [facility], [address], [place], [place] [address]    Special Requests   Final    BOTTLES DRAWN AEROBIC AND ANAEROBIC Blood Culture adequate volume Performed at [facility], [address], [place], [place] [address]    Culture   Final    NO GROWTH 1 DAY Performed at [facility], [address], [place], [place] [address]    Report Status PENDING  Incomplete  Resp panel by RT-PCR (RSV, Flu A&B, Covid) Anterior Nasal Swab     Status: Abnormal   Collection Time: 05/03/23  2:18  PM   Specimen: Anterior Nasal Swab  Result Value Ref Range Status   SARS Coronavirus 2 by RT PCR NEGATIVE NEGATIVE Final    Comment: (NOTE) SARS-CoV-2 target nucleic acids are NOT DETECTED.  The SARS-CoV-2 RNA is generally detectable in upper respiratory specimens during the acute phase of infection. The lowest concentration of SARS-CoV-2 viral copies this assay can detect is 138 copies/mL. A negative result does not preclude SARS-Cov-2 infection and should not be used as the sole basis for treatment or other patient management decisions. A negative result may occur with  improper specimen collection/handling, submission of specimen other than nasopharyngeal swab, presence of viral mutation(s) within the areas targeted by this assay, and inadequate number of viral copies(<138 copies/mL). A negative result must be combined with clinical observations, patient history, and epidemiological information. The expected result is Negative.  Fact Sheet for Patients:  bloggercourse.com  Fact Sheet for Healthcare Providers:  seriousbroker.it  This test is no t yet approved or cleared by the [place]  FDA and  has been authorized for detection and/or diagnosis of SARS-CoV-2 by FDA under an Emergency Use Authorization (EUA). This EUA will remain  in effect  (meaning this test can be used) for the duration of the COVID-19 declaration under Section 564(b)(1) of the Act, 21 U.S.C.section 360bbb-3(b)(1), unless the authorization is terminated  or revoked sooner.       Influenza A by PCR NEGATIVE NEGATIVE Final   Influenza B by PCR NEGATIVE NEGATIVE Final    Comment: (NOTE) The Xpert Xpress SARS-CoV-2/FLU/RSV plus assay is intended as an aid in the diagnosis of influenza from Nasopharyngeal swab specimens and should not be used as a sole basis for treatment. Nasal washings and aspirates are unacceptable for Xpert Xpress SARS-CoV-2/FLU/RSV testing.  Fact Sheet for Patients: bloggercourse.com  Fact Sheet for Healthcare Providers: seriousbroker.it  This test is not yet approved or cleared by the [place]  FDA and has been authorized for detection and/or diagnosis of SARS-CoV-2 by FDA under an Emergency Use Authorization (EUA). This EUA will remain in effect (meaning this test can be used) for the duration of the COVID-19 declaration under Section 564(b)(1) of the Act, 21 U.S.C. section 360bbb-3(b)(1), unless the authorization is terminated or revoked.     Resp Syncytial Virus by PCR POSITIVE (A) NEGATIVE Final    Comment: (NOTE) Fact Sheet for Patients: bloggercourse.com  Fact Sheet for Healthcare Providers: seriousbroker.it  This test is not yet approved or cleared by the [place]  FDA and has been authorized for detection and/or diagnosis of SARS-CoV-2 by FDA under an Emergency Use Authorization (EUA). This EUA will remain in effect (meaning this test can be used) for the duration of the COVID-19 declaration under Section 564(b)(1) of the Act, 21 U.S.C. section 360bbb-3(b)(1), unless the authorization is terminated or revoked.  Performed at [facility], [address], [place], [place] [address]    Blood culture (routine x 2)     Status: None (Preliminary result)   Collection Time: 05/04/23  5:58 AM   Specimen: BLOOD  Result Value Ref Range Status   Specimen Description   Final    BLOOD SITE NOT SPECIFIED Performed at [facility], [address] [place], [place] [address]    Special Requests   Final    BOTTLES DRAWN AEROBIC AND ANAEROBIC Blood Culture adequate volume Performed at [facility], [address], [place], [place] [address]    Culture   Final    NO  GROWTH < 24 HOURS Performed at [facility], [address] [place], [place] [address]    Report Status PENDING  Incomplete         Radiology Studies: CT ABDOMEN PELVIS W CONTRAST Result Date: 05/03/2023 CLINICAL DATA:  Peritonitis or perforation suspected. Abdominal pain. Nausea. Gastrostomy tube insertion. EXAM: CT ABDOMEN AND PELVIS WITH CONTRAST TECHNIQUE: Multidetector CT imaging of the abdomen and pelvis was performed using the standard protocol following bolus administration of intravenous contrast. RADIATION DOSE REDUCTION: This exam was performed according to the departmental dose-optimization program which includes automated exposure control, adjustment of the mA and/or kV according to patient size and/or use of iterative reconstruction technique. CONTRAST:  OMNIPAQUE  IOHEXOL  300 MG/ML  SOLN COMPARISON:  PET-CT 03/23/2023. FINDINGS: Lower chest: No acute abnormality. Hepatobiliary: No focal liver abnormality is seen. No gallstones, gallbladder wall thickening, or biliary dilatation. Pancreas: Unremarkable. No pancreatic ductal dilatation or surrounding inflammatory changes. Spleen: Normal in size without focal abnormality. Adrenals/Urinary Tract: Subcentimeter rounded hypodensities in the left kidney are too small to characterize, likely cysts. Otherwise, the adrenal glands, kidneys and bladder are within normal limits. Stomach/Bowel: There is a new percutaneous gastrostomy tube  in the body of the stomach. There is mild stranding surrounding the stomach at the level of the gastrostomy tube without fluid collection or free air. The stomach otherwise appears within normal limits. Appendix is not seen. No evidence of other bowel wall thickening, distention, or inflammatory changes. Vascular/Lymphatic: Aortic atherosclerosis. No enlarged abdominal or pelvic lymph nodes. Reproductive: Prostate is unremarkable. Other: There is a small fat containing umbilical hernia. There is no free fluid or free air. There some subcutaneous and intramuscular edema/stranding at the level of the gastrostomy. No drainable fluid collections are seen. No soft tissue gas. Musculoskeletal: No acute or significant osseous findings. IMPRESSION: 1. New percutaneous gastrostomy tube in the body of the stomach. There is mild stranding surrounding the stomach at the level of the gastrostomy tube without fluid collection or free air. 2. Subcutaneous and intramuscular edema/stranding at the level of the gastrostomy tube. No drainable fluid collections. 3. No other acute localizing process in the abdomen or pelvis. Aortic Atherosclerosis (ICD10-I70.0). Electronically Signed   By: Greig Pique M.D.   On: 05/03/2023 17:35   DG Chest 2 View Result Date: 05/03/2023 CLINICAL DATA:  Productive cough.  Laryngeal carcinoma. EXAM: CHEST - 2 VIEW COMPARISON:  None Available. FINDINGS: The heart size and mediastinal contours are within normal limits. Right-sided Port-A-Cath is seen in appropriate position. Both lungs are clear. The visualized skeletal structures are unremarkable. IMPRESSION: No active cardiopulmonary disease. Electronically Signed   By: Norleen DELENA Kil M.D.   On: 05/03/2023 15:36           LOS: 2 days   Time spent= 35 mins    Sergio JAYSON Dare, MD [facility]  If 7PM-7AM, please contact night-coverage  05/05/2023, 11:53 AM

## 2023-05-05 NOTE — Progress Notes (Signed)
 Initial Nutrition Assessment  DOCUMENTATION CODES:   Non-severe (moderate) malnutrition in context of chronic illness  INTERVENTION:  - Initiate tube feeding via G-tube: Osmolite 1.5 at 60 ml/h (1440 ml per day) *Start at 70mL/hr and advance by 10mL Q12H  Prosource TF20 60 ml daily  +132mL Q4H FWF ( ) Provides 2240 kcal, 110 gm protein, 1997 ml free water  daily  - Monitor magnesium , potassium, and phosphorus BID for at least 3 days, MD to replete as needed, as pt is at risk for refeeding syndrome given significant weight loss and inadequate oral intake for >2 weeks. - Add 100mg  Thiamine  daily for 5 days.  - Patient NPO at this time.   - Can consider bolus regimen for home after discharge, once tolerating goal continuous tube feeds. Would recommend: 6.5 cartons of Osmolite 1.5 daily  *Consider 2 cartons @ 6am, 1.5 cartons @ 11am, 1.5 cartons @ 4pm, and 1.5 cartons at 9pm + 30mL flushes before and after each feed ( ) and 175mL flushes 4x daily ( ) Provides 2310 kcals, 97g protein, and free water    NUTRITION DIAGNOSIS:   Moderate Malnutrition related to chronic illness (squamous cell carcinoma of the supraglottis on chemo/radiation) as evidenced by mild fat depletion, mild muscle depletion, percent weight loss (6% in 2 weeks).  GOAL:   Patient will meet greater than or equal to 90% of their needs  MONITOR:   Diet advancement, Labs, Weight trends, TF tolerance  REASON FOR ASSESSMENT:   Consult Assessment of nutrition requirement/status, Enteral/tube feeding initiation and management  ASSESSMENT:   53 y.o. 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  Patient reports a UBW of 162# and weight loss over the past ~2 weeks due to not eating  well and treatment. Per EMR, patient has had a 9# or 6% weight loss in the past 2 weeks, which is severe for the time frame.   Patient endorses that during those two weeks, he lost his sense of taste and this greatly impacted his intake. Was trying to eat but noted he started to feel sick to his stomach. Has also been having increased swallowing difficulty with ongoing treatment. Admits to having had almost no intake ofr the past 2 weeks.   Before issues began 2 weeks ago, patient reports eating very well with 3+ meals a day. Also drinking Boost/Ensure 2x daily.   Patient currently NPO. Admitted for G-tube site infection but IR evaluated site and per their recs G-tube okay to use for TF/meds/flushes.   Patient started on Osmolite 1.2 at 80mL/hr yesterday evening. Due to concern for refeeding syndrome (given significant weight loss and inadequate oral intake >2 weeks), will plan to reduce rate to 52mL/hr and advance by 10mL Q12H to goal. Will also add thiamine  due to refeeding risk.  Discussed plan with MD and RN.   Suspect that once patient tolerating goal continuous regimen, can transition to bolus for discharge. Patient is followed by outpatient cancer RD so this could be done after discharge if necessary. Will leave recs above if remains admitted long enough for bolus regimen.     Medications reviewed and include: -  Labs reviewed:  -   NUTRITION - FOCUSED PHYSICAL EXAM:  Flowsheet Row Most Recent Value  Orbital Region Mild depletion  Upper Arm Region Moderate depletion  Thoracic and Lumbar Region Mild depletion  Buccal Region No depletion  Temple Region Mild depletion  Clavicle Bone Region No depletion  Clavicle and Acromion Bone Region Mild depletion  Scapular Bone Region Unable to assess  Dorsal Hand No depletion  Patellar Region No depletion  Anterior Thigh Region No depletion  Posterior Calf Region No depletion  Edema (RD Assessment) None  Hair Reviewed  Eyes Reviewed   Mouth Reviewed  Skin Reviewed  Nails Reviewed       Diet Order:   Diet Order             Diet NPO time specified Except for: Ice Chips, Sips with Meds  Diet effective midnight                   EDUCATION NEEDS:  Education needs have been addressed  Skin:  Skin Assessment: Reviewed RN Assessment  Last BM:  2/5  Height:  Ht Readings from Last 1 Encounters:  05/04/23 5' 4 (1.626 m)   Weight:  Wt Readings from Last 1 Encounters:  05/04/23 66.1 kg    BMI:  Body mass index is 25.01 kg/m.  Estimated Nutritional Needs:  Kcal:  2000-2300 kcals Protein:  100-120 grams Fluid:  >/= 2L    Trude Ned RD, LDN Contact via Secure Chat.

## 2023-05-05 NOTE — Progress Notes (Addendum)
 SLP reached out to [name] via secure chat as [name] was scheduled for an OP MBS due to his dysphagia associated with his supraglottic cancer and treatment.   [name] indicated it would not be unreasonable to pursue MBS prior to pt discharging hospital, therefore will request order from hospitalist given pt has initiated XRT 1/15 and chemo 1/20.  If pt does not have MBS as IP - can be completed as OP if [name] indicates necessity.    [name] graciously approved plan for MBS prior to dc, SLP will survey chart to determine readiness.  Thank you.   Sergio POUR, MS [facility] SLP [facility] [employer] [phone]

## 2023-05-06 ENCOUNTER — Ambulatory Visit
Admission: RE | Admit: 2023-05-06 | Discharge: 2023-05-06 | Disposition: A | Discharge status: Home / Self Care | Payer: Medicare HMO | Source: Ambulatory Visit | Attending: Radiation Oncology | Admitting: Radiation Oncology

## 2023-05-06 ENCOUNTER — Inpatient Hospital Stay: Payer: Medicare HMO

## 2023-05-06 ENCOUNTER — Other Ambulatory Visit: Payer: Self-pay

## 2023-05-06 ENCOUNTER — Ambulatory Visit: Payer: Medicare HMO | Admitting: Physical Therapy

## 2023-05-06 DIAGNOSIS — E440 Moderate protein-calorie malnutrition: Secondary | ICD-10-CM | POA: Insufficient documentation

## 2023-05-06 DIAGNOSIS — K9422 Gastrostomy infection: Secondary | ICD-10-CM | POA: Diagnosis not present

## 2023-05-06 DIAGNOSIS — L03311 Cellulitis of abdominal wall: Secondary | ICD-10-CM | POA: Diagnosis not present

## 2023-05-06 DIAGNOSIS — C321 Malignant neoplasm of supraglottis: Secondary | ICD-10-CM

## 2023-05-06 DIAGNOSIS — L089 Local infection of the skin and subcutaneous tissue, unspecified: Secondary | ICD-10-CM | POA: Diagnosis not present

## 2023-05-06 DIAGNOSIS — Z510 Encounter for antineoplastic radiation therapy: Secondary | ICD-10-CM | POA: Diagnosis not present

## 2023-05-06 LAB — BASIC METABOLIC PANEL
Anion gap: 10 (ref 5–15)
BUN: 10 mg/dL (ref 6–20)
CO2: 27 mmol/L (ref 22–32)
Calcium: 9 mg/dL (ref 8.9–10.3)
Chloride: 101 mmol/L (ref 98–111)
Creatinine, Ser: 0.9 mg/dL (ref 0.61–1.24)
GFR, Estimated: >60 mL/min (ref >60)
Glucose, Bld: 124 mg/dL — ABNORMAL HIGH (ref 70–99)
Potassium: 3.8 mmol/L (ref 3.5–5.1)
Sodium: 138 mmol/L (ref 135–145)

## 2023-05-06 LAB — CBC
HCT: 37 % — ABNORMAL LOW (ref 39.0–52.0)
Hemoglobin: 11.8 g/dL — ABNORMAL LOW (ref 13.0–17.0)
MCH: 29.1 pg (ref 26.0–34.0)
MCHC: 31.9 g/dL (ref 30.0–36.0)
MCV: 91.1 fL (ref 80.0–100.0)
Platelets: 247 10*3/uL (ref 150–400)
RBC: 4.06 MIL/uL — ABNORMAL LOW (ref 4.22–5.81)
RDW: 12.9 % (ref 11.5–15.5)
WBC: 4.2 10*3/uL (ref 4.0–10.5)
nRBC: 0 % (ref 0.0–0.2)

## 2023-05-06 LAB — RAD ONC ARIA SESSION SUMMARY
Course Elapsed Days: 22
Plan Fractions Treated to Date: 17
Plan Prescribed Dose Per Fraction: 2 Gy
Plan Total Fractions Prescribed: 35
Plan Total Prescribed Dose: 70 Gy
Reference Point Dosage Given to Date: 34 Gy
Reference Point Session Dosage Given: 2 Gy
Session Number: 17

## 2023-05-06 LAB — PHOSPHORUS
Phosphorus: 3.7 mg/dL (ref 2.5–4.6)
Phosphorus: 4 mg/dL (ref 2.5–4.6)

## 2023-05-06 LAB — GLUCOSE, CAPILLARY
Glucose-Capillary: 119 mg/dL — ABNORMAL HIGH (ref 70–99)
Glucose-Capillary: 124 mg/dL — ABNORMAL HIGH (ref 70–99)
Glucose-Capillary: 139 mg/dL — ABNORMAL HIGH (ref 70–99)
Glucose-Capillary: 153 mg/dL — ABNORMAL HIGH (ref 70–99)
Glucose-Capillary: 140 mg/dL — ABNORMAL HIGH (ref 70–99)

## 2023-05-06 LAB — MAGNESIUM
Magnesium: 2.4 mg/dL (ref 1.7–2.4)
Magnesium: 2.1 mg/dL (ref 1.7–2.4)

## 2023-05-06 MED ORDER — VANCOMYCIN HCL 1750 MG/350ML IV SOLN
1750.0000 mg | INTRAVENOUS | Status: DC
  Administered 2023-05-06: 1750 mg via INTRAVENOUS
  Filled 2023-05-06: qty 350

## 2023-05-06 NOTE — Plan of Care (Signed)
   Problem: Education: Goal: Knowledge of General Education information will improve Description: Including pain rating scale, medication(s)/side effects and non-pharmacologic comfort measures Outcome: Progressing   Problem: Coping: Goal: Level of anxiety will decrease Outcome: Progressing

## 2023-05-06 NOTE — Progress Notes (Signed)
 PROGRESS NOTE    [name]  FMW:[id number] DOB: 1970-09-19 DOA: 05/03/2023 PCP: Tobie Suzzane POUR, MD    Brief Narrative:   53 y.o. 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  Patient is slowly improving on IV antibiotics.  Hopefully home soon  Assessment & Plan:  Principal Problem:   Skin infection at gastrostomy tube site [facility]) Active Problems:   Essential hypertension   Malignant neoplasm of supraglottis (HCC)   RSV infection   Cellulitis of abdomen with infection at gastrostomy tube site: G-tube placed by IR on 04/28/2023, now complicated by G-tube site infection with surrounding abdominal wall cellulitis.  CT A/P without evidence of fluid collection or free air.  Evaluated by IR, no intervention at this time.  Okay to use his feeding tube.  On IV antibiotics as symptoms are improving.  Cultures remain negative. - Dietitian following.  Speech and swallow evaluation.   RSV positive: Mild URI symptoms.  CXR without evidence of superimposed pneumonia.  IS/ bronchodilators.    Supraglottic cancer: On active radiation and chemotherapy with cisplatin .  Follows outpatient radiation and medical oncology -Continue lidocaine  solution qid, scopolamine  patch, Pepcid    Hypertension: BP is stable.  Will slowly resume as blood pressure allows IV prn   Depression/anxiety: Continue Prozac , Seroquel , and Atarax  as needed.   DVT prophylaxis: SCDs Start: 05/03/23 2000    Code Status: Full Code Family Communication:  Psychiatrist.  Status is: Inpatient Remains inpatient appropriate because: Hosp stay for IV Abx.  I think we should be to discharge him tomorrow morning with p.o. antibiotics   Subjective: Abdominal pain  and erythema continues to improve.  Examination:  General exam: Appears calm and comfortable  Respiratory system: Clear to auscultation. Respiratory effort normal. Cardiovascular system: S1 & S2 heard, RRR. No JVD, murmurs, rubs, gallops or clicks. No pedal edema. Gastrointestinal system: Abdomen is nondistended, soft and nontender. No organomegaly or masses felt. Normal bowel sounds heard. Central nervous system: Alert and oriented. No focal neurological deficits. Extremities: Symmetric 5 x 5 power. Skin: Peg tube with surrounding erythema.  Improved Psychiatry: Judgement and insight appear normal. Mood & affect appropriate.                Diet Orders (From admission, onward)     Start     Ordered   05/04/23 0001  Diet NPO time specified Except for: Ice Chips, Sips with Meds  Diet effective midnight       Question Answer Comment  Except for Ice Chips   Except for Sips with Meds      05/03/23 1952            Objective: Vitals:   05/06/23 0513 05/06/23 0711 05/06/23 1141 05/06/23 1145  BP: (!) 134/96  126/78 126/78  Pulse: 70  61 (!) 59  Resp: 16  18 16   Temp: 98.8 F (37.1 C)  99.3 F (37.4 C) 99.3 F (37.4 C)  TempSrc: Oral  Oral Oral  SpO2: 92%  94% 91%  Weight:  65.3 kg    Height:        Intake/Output Summary (Last 24 hours) at 05/06/2023 1215 Last data filed at 05/06/2023 1000 Gross per 24 hour  Intake 2059.66 ml  Output 0 ml  Net 2059.66 ml   [employer]  05/03/23 1306 05/04/23 1344 05/06/23 0711  Weight: 66.3 kg 66.1 kg 65.3 kg    Scheduled Meds:  Chlorhexidine  Gluconate Cloth  6 each Topical Daily   famotidine   40 mg Oral Daily   feeding supplement (PROSource TF20)  60 mL Per Tube Daily   FLUoxetine   20 mg Oral Daily   free water   150 mL Per Tube Q4H   QUEtiapine   50 mg Oral QHS   scopolamine   1 patch Transdermal Q72H   thiamine   100 mg Oral Daily   Continuous Infusions:  cefTRIAXone  (ROCEPHIN )  IV 2 g (05/05/23 1956)   feeding  supplement (OSMOLITE 1.5 CAL) 40 mL/hr at 05/06/23 0023   vancomycin       Nutritional status Signs/Symptoms: mild fat depletion, mild muscle depletion, percent weight loss (6% in 2 weeks) Percent weight loss: 6 % (in 2 weeks) Interventions: Refer to RD note for recommendations, Tube feeding Body mass index is 24.71 kg/m.  Data Reviewed:   CBC: Recent Labs  Lab 04/30/23 1425 05/03/23 1352 05/04/23 0557 05/05/23 0007 05/06/23 0159  WBC 7.0 8.9 7.9 5.0 4.2  NEUTROABS 5.4 7.1  --   --   --   HGB 13.0 13.3 12.8* 12.0* 11.8*  HCT 39.2 40.9 38.7* 37.7* 37.0*  MCV 87.7 91.3 92.1 91.5 91.1  PLT 122* 170 168 213 247   Basic Metabolic Panel: Recent Labs  Lab 04/30/23 1425 05/03/23 1352 05/04/23 0558 05/05/23 0007 05/05/23 1805 05/06/23 0159  NA 137 135 135 139  --  138  K 4.0 4.3 4.4 3.8  --  3.8  CL 101 99 99 102  --  101  CO2 29 27 25 27   --  27  GLUCOSE 108* 101* 98 127*  --  124*  BUN 15 11 13 12   --  10  CREATININE 1.08 0.82 1.03 0.63  --  0.90  CALCIUM 9.0 9.0 8.8* 8.7*  --  9.0  MG 2.1  --   --  2.4 2.1 2.4  PHOS  --   --   --  4.0 3.7 3.7   GFR: Estimated Creatinine Clearance: 78.6 mL/min (by C-G formula based on SCr of 0.9 mg/dL). Liver Function Tests: Recent Labs  Lab 05/03/23 1352  AST 14*  ALT 13  ALKPHOS 49  BILITOT 0.8  PROT 8.0  ALBUMIN 3.6   Recent Labs  Lab 05/04/23 0558  LIPASE 30   No results for input(s): AMMONIA in the last 168 hours. Coagulation Profile: No results for input(s): INR, PROTIME in the last 168 hours. Cardiac Enzymes: No results for input(s): CKTOTAL, CKMB, CKMBINDEX, TROPONINI in the last 168 hours. BNP (last 3 results) No results for input(s): PROBNP in the last 8760 hours. HbA1C: No results for input(s): HGBA1C in the last 72 hours. CBG: Recent Labs  Lab 05/05/23 1955 05/05/23 2313 05/06/23 0515 05/06/23 0754 05/06/23 1141  GLUCAP 97 115* 119* 124* 139*   Lipid Profile: No results for  input(s): CHOL, HDL, LDLCALC, TRIG, CHOLHDL, LDLDIRECT in the last 72 hours. Thyroid  Function Tests: No results for input(s): TSH, T4TOTAL, FREET4, T3FREE, THYROIDAB in the last 72 hours. Anemia Panel: No results for input(s): VITAMINB12, FOLATE, FERRITIN, TIBC, IRON, RETICCTPCT in the last 72 hours. Sepsis Labs: Recent Labs  Lab 05/03/23 1833  LATICACIDVEN 0.5    Recent Results (from the past 240 hours)  Blood culture (routine x 2)     Status: None (Preliminary result)   Collection Time: 05/03/23  1:52 PM   Specimen:  BLOOD  Result Value Ref Range Status   Specimen Description   Final    BLOOD LEFT ANTECUBITAL Performed at [facility], [address], [place], [place] [address]    Special Requests   Final    BOTTLES DRAWN AEROBIC AND ANAEROBIC Blood Culture adequate volume Performed at [facility], [address] [place], [place] [address]    Culture   Final    NO GROWTH 2 DAYS Performed at [facility], [address] [place], [place] [address]    Report Status PENDING  Incomplete  Resp panel by RT-PCR (RSV, Flu A&B, Covid) Anterior Nasal Swab     Status: Abnormal   Collection Time: 05/03/23  2:18 PM   Specimen: Anterior Nasal Swab  Result Value Ref Range Status   SARS Coronavirus 2 by RT PCR NEGATIVE NEGATIVE Final    Comment: (NOTE) SARS-CoV-2 target nucleic acids are NOT DETECTED.  The SARS-CoV-2 RNA is generally detectable in upper respiratory specimens during the acute phase of infection. The lowest concentration of SARS-CoV-2 viral copies this assay can detect is 138 copies/mL. A negative result does not preclude SARS-Cov-2 infection and should not be used as the sole basis for treatment or other patient management decisions. A negative result may occur with  improper specimen collection/handling, submission of specimen other than nasopharyngeal swab, presence of viral mutation(s)  within the areas targeted by this assay, and inadequate number of viral copies(<138 copies/mL). A negative result must be combined with clinical observations, patient history, and epidemiological information. The expected result is Negative.  Fact Sheet for Patients:  bloggercourse.com  Fact Sheet for Healthcare Providers:  seriousbroker.it  This test is no t yet approved or cleared by the [place]  FDA and  has been authorized for detection and/or diagnosis of SARS-CoV-2 by FDA under an Emergency Use Authorization (EUA). This EUA will remain  in effect (meaning this test can be used) for the duration of the COVID-19 declaration under Section 564(b)(1) of the Act, 21 U.S.C.section 360bbb-3(b)(1), unless the authorization is terminated  or revoked sooner.       Influenza A by PCR NEGATIVE NEGATIVE Final   Influenza B by PCR NEGATIVE NEGATIVE Final    Comment: (NOTE) The Xpert Xpress SARS-CoV-2/FLU/RSV plus assay is intended as an aid in the diagnosis of influenza from Nasopharyngeal swab specimens and should not be used as a sole basis for treatment. Nasal washings and aspirates are unacceptable for Xpert Xpress SARS-CoV-2/FLU/RSV testing.  Fact Sheet for Patients: bloggercourse.com  Fact Sheet for Healthcare Providers: seriousbroker.it  This test is not yet approved or cleared by the [place]  FDA and has been authorized for detection and/or diagnosis of SARS-CoV-2 by FDA under an Emergency Use Authorization (EUA). This EUA will remain in effect (meaning this test can be used) for the duration of the COVID-19 declaration under Section 564(b)(1) of the Act, 21 U.S.C. section 360bbb-3(b)(1), unless the authorization is terminated or revoked.     Resp Syncytial Virus by PCR POSITIVE (A) NEGATIVE Final    Comment: (NOTE) Fact Sheet for  Patients: bloggercourse.com  Fact Sheet for Healthcare Providers: seriousbroker.it  This test is not yet approved or cleared by the [place]  FDA and has been authorized for detection and/or diagnosis of SARS-CoV-2 by FDA under an Emergency Use Authorization (EUA). This EUA will remain in effect (meaning this test can be used) for the duration of the COVID-19 declaration under Section 564(b)(1) of the Act, 21 U.S.C. section  360bbb-3(b)(1), unless the authorization is terminated or revoked.  Performed at [facility], [address], [place], [place] [address]   Blood culture (routine x 2)     Status: None (Preliminary result)   Collection Time: 05/04/23  5:58 AM   Specimen: BLOOD  Result Value Ref Range Status   Specimen Description   Final    BLOOD SITE NOT SPECIFIED Performed at [facility], [address], [place], [place] [address]    Special Requests   Final    BOTTLES DRAWN AEROBIC AND ANAEROBIC Blood Culture adequate volume Performed at [facility], [address] [place], [place] [address]    Culture   Final    NO GROWTH 2 DAYS Performed at [facility], [address], [place], [place] [address]    Report Status PENDING  Incomplete         Radiology Studies: No results found.         LOS: 3 days   Time spent= 35 mins    Burgess JAYSON Dare, MD [facility]  If 7PM-7AM, please contact night-coverage  05/06/2023, 12:15 PM

## 2023-05-06 NOTE — Progress Notes (Signed)
 Referring Physician(s): none  Supervising Physician: Luverne Aran  Patient Status:  [facility] - In-pt  Chief Complaint:  Purulent discharge from the G tube insertion site  Patient is s/p g tube placement on 1/29   Subjective:  Patient laying in bed, NAD.  Reports that the discharge from the G tube stopped, the site looks much better and it is not hurting anymore.  The G tube is in use.   Allergies: Patient has no known allergies.  Medications: Prior to Admission medications   Medication Sig Start Date End Date Taking? Authorizing Provider  acetaminophen  (TYLENOL ) 500 MG tablet Take 1,000 mg by mouth every 6 (six) hours as needed for mild pain (pain score 1-3) or moderate pain (pain score 4-6).   Yes [provider]  amlodipine -olmesartan  (AZOR ) 10-20 MG tablet Take 1 tablet by mouth daily. 02/04/23  Yes Tobie Suzzane POUR, MD  famotidine  (PEPCID ) 40 MG tablet Take 1 tablet (40 mg total) by mouth daily. 02/04/23  Yes Tobie Suzzane POUR, MD  FLUoxetine  (PROZAC ) 20 MG capsule Take 1 capsule (20 mg total) by mouth daily. 03/29/23  Yes Tobie Suzzane POUR, MD  hydrOXYzine  (ATARAX ) 25 MG tablet Take 1 tablet (25 mg total) by mouth 3 (three) times daily as needed for anxiety. 02/04/23  Yes Tobie Suzzane POUR, MD  lidocaine  (XYLOCAINE ) 2 % solution Patient: Mix 1part 2% viscous lidocaine , 1part H20. Swallow 10mL of diluted mixture, before meals and at bedtime, up to QID 03/26/23  Yes Izell Domino, MD  metoprolol  succinate (TOPROL -XL) 25 MG 24 hr tablet Take 1 tablet (25 mg total) by mouth daily. 02/04/23  Yes Tobie Suzzane POUR, MD  nicotine  (NICODERM CQ  - DOSED IN MG/24 HOURS) 21 mg/24hr patch Place 1 patch (21 mg total) onto the skin daily. Apply 21 mg patch daily x 6 wk, then 14mg  patch daily x 2 wk, then 7 mg patch daily x 2 wk 03/26/23  Yes Izell Domino, MD  ondansetron  (ZOFRAN ) 8 MG tablet Take 1 tablet (8 mg total) by mouth every 8 (eight) hours as needed for nausea or vomiting. Start  on the third day after cisplatin . 04/14/23  Yes Pasam, Avinash, MD  prochlorperazine  (COMPAZINE ) 10 MG tablet TAKE 1 TABLET(10 MG) BY MOUTH EVERY 6 HOURS AS NEEDED FOR NAUSEA OR VOMITING 04/29/23  Yes Pasam, Avinash, MD  QUEtiapine  (SEROQUEL ) 50 MG tablet Take one pill nightly for 1 week. Then increase to 2 pills nightly for 1 week. Then increase to 3 pills nightly for one week. Then increase to 4 pills nightly for one week. 04/05/23  Yes Barbra Jayson LABOR, MD  dexamethasone  (DECADRON ) 4 MG tablet Take 2 tablets (8 mg) by mouth daily x 3 days starting the day after cisplatin  chemotherapy. Take with food. Patient not taking: Reported on 05/04/2023 04/14/23   Pasam, Chinita, MD  fluticasone  (FLONASE ) 50 MCG/ACT nasal spray Place 1 spray into both nostrils 2 (two) times daily. Patient not taking: Reported on 05/04/2023 10/28/22   Stuart Vernell Norris, PA-C  HYDROcodone -acetaminophen  (NORCO) 5-325 MG tablet Take 1-2 tablets by mouth every 6 (six) hours as needed for moderate pain (pain score 4-6). Patient not taking: Reported on 05/04/2023 04/28/23   Allred, Darrell K, PA-C  lidocaine -prilocaine  (EMLA ) cream Apply to affected area once Patient not taking: Reported on 05/04/2023 04/14/23   Pasam, Chinita, MD  nicotine  (NICODERM CQ  - DOSED IN MG/24 HOURS) 14 mg/24hr patch Place 1 patch (14 mg total) onto the skin daily. Apply 21 mg patch  daily x 6 wk, then 14mg  patch daily x 2 wk, then 7 mg patch daily x 2 wk Patient not taking: Reported on 05/04/2023 03/26/23   Izell Domino, MD  nicotine  (NICODERM CQ  - DOSED IN MG/24 HR) 7 mg/24hr patch Place 1 patch (7 mg total) onto the skin daily. Apply 21 mg patch daily x 6 wk, then 14mg  patch daily x 2 wk, then 7 mg patch daily x 2 wk Patient not taking: Reported on 05/04/2023 03/26/23   Izell Domino, MD  QUEtiapine  (SEROQUEL ) 200 MG tablet Take 1 tablet (200 mg total) by mouth at bedtime. Patient not taking: Reported on 05/04/2023 05/02/23   Barbra Jayson LABOR, MD  scopolamine   (TRANSDERM-SCOP) 1 MG/3DAYS Place 1 patch (1.5 mg total) onto the skin every 3 (three) days. Patient not taking: Reported on 05/04/2023 05/03/23   Izell Domino, MD     Vital Signs: BP (!) 134/96 (BP Location: Left Arm)   Pulse 70   Temp 98.8 F (37.1 C) (Oral)   Resp 16   Ht 5' 4 (1.626 m)   Wt 143 lb 15.4 oz (65.3 kg)   SpO2 92%   BMI 24.71 kg/m   Physical Exam Vitals reviewed.  Constitutional:      General: He is not in acute distress.    Appearance: He is not ill-appearing.  HENT:     Head: Normocephalic and atraumatic.  Pulmonary:     Effort: Pulmonary effort is normal.  Abdominal:     General: Abdomen is flat.     Palpations: Abdomen is soft.  Skin:    General: Skin is warm and dry.     Coloration: Skin is not jaundiced or pale.     Comments: The site with previous induration was marked. The site is soft and non tender except the skin around the G tube insertion site is still hard and tender, the color is little darker than other part of abdominal skin. No discharge, no findings suggesting sq abscess.   Neurological:     Mental Status: He is alert.  Psychiatric:        Mood and Affect: Mood normal.        Behavior: Behavior normal.        Judgment: Judgment normal.     Imaging: CT ABDOMEN PELVIS W CONTRAST Result Date: 05/03/2023 CLINICAL DATA:  Peritonitis or perforation suspected. Abdominal pain. Nausea. Gastrostomy tube insertion. EXAM: CT ABDOMEN AND PELVIS WITH CONTRAST TECHNIQUE: Multidetector CT imaging of the abdomen and pelvis was performed using the standard protocol following bolus administration of intravenous contrast. RADIATION DOSE REDUCTION: This exam was performed according to the departmental dose-optimization program which includes automated exposure control, adjustment of the mA and/or kV according to patient size and/or use of iterative reconstruction technique. CONTRAST:  OMNIPAQUE  IOHEXOL  300 MG/ML  SOLN COMPARISON:  PET-CT 03/23/2023.  FINDINGS: Lower chest: No acute abnormality. Hepatobiliary: No focal liver abnormality is seen. No gallstones, gallbladder wall thickening, or biliary dilatation. Pancreas: Unremarkable. No pancreatic ductal dilatation or surrounding inflammatory changes. Spleen: Normal in size without focal abnormality. Adrenals/Urinary Tract: Subcentimeter rounded hypodensities in the left kidney are too small to characterize, likely cysts. Otherwise, the adrenal glands, kidneys and bladder are within normal limits. Stomach/Bowel: There is a new percutaneous gastrostomy tube in the body of the stomach. There is mild stranding surrounding the stomach at the level of the gastrostomy tube without fluid collection or free air. The stomach otherwise appears within normal limits. Appendix is not seen.  No evidence of other bowel wall thickening, distention, or inflammatory changes. Vascular/Lymphatic: Aortic atherosclerosis. No enlarged abdominal or pelvic lymph nodes. Reproductive: Prostate is unremarkable. Other: There is a small fat containing umbilical hernia. There is no free fluid or free air. There some subcutaneous and intramuscular edema/stranding at the level of the gastrostomy. No drainable fluid collections are seen. No soft tissue gas. Musculoskeletal: No acute or significant osseous findings. IMPRESSION: 1. New percutaneous gastrostomy tube in the body of the stomach. There is mild stranding surrounding the stomach at the level of the gastrostomy tube without fluid collection or free air. 2. Subcutaneous and intramuscular edema/stranding at the level of the gastrostomy tube. No drainable fluid collections. 3. No other acute localizing process in the abdomen or pelvis. Aortic Atherosclerosis (ICD10-I70.0). Electronically Signed   By: Greig Pique M.D.   On: 05/03/2023 17:35   DG Chest 2 View Result Date: 05/03/2023 CLINICAL DATA:  Productive cough.  Laryngeal carcinoma. EXAM: CHEST - 2 VIEW COMPARISON:  None Available.  FINDINGS: The heart size and mediastinal contours are within normal limits. Right-sided Port-A-Cath is seen in appropriate position. Both lungs are clear. The visualized skeletal structures are unremarkable. IMPRESSION: No active cardiopulmonary disease. Electronically Signed   By: Norleen DELENA Kil M.D.   On: 05/03/2023 15:36   IR PATIENT EVAL TECH 0-60 MINS Result Date: 05/03/2023 Katha Harlene ORN, RT     05/03/2023  2:21 PM Patient presented to IR today with complaints of redness, tenderness, and swelling around newly placed gastrostomy tube site.  Gastrostomy tube bumper appeared to be tight to skin, likely due to swelling.  After bumper was loosened, pus came out from around the tube site. At this time, S. Neita, [place] consulted D. Johann, MD, who advised patient to go to Emergency Department to be treated for infection.    Labs:  CBC: Recent Labs    05/03/23 1352 05/04/23 0557 05/05/23 0007 05/06/23 0159  WBC 8.9 7.9 5.0 4.2  HGB 13.3 12.8* 12.0* 11.8*  HCT 40.9 38.7* 37.7* 37.0*  PLT 170 168 213 247    COAGS: Recent Labs    04/28/23 0805  INR 0.9    BMP: Recent Labs    05/03/23 1352 05/04/23 0558 05/05/23 0007 05/06/23 0159  NA 135 135 139 138  K 4.3 4.4 3.8 3.8  CL 99 99 102 101  CO2 27 25 27 27   GLUCOSE 101* 98 127* 124*  BUN 11 13 12 10   CALCIUM 9.0 8.8* 8.7* 9.0  CREATININE 0.82 1.03 0.63 0.90  GFRNONAA >60 >60 >60 >60    LIVER FUNCTION TESTS: Recent Labs    02/04/23 1354 04/05/23 0806 04/16/23 1422 05/03/23 1352  BILITOT 0.3 1.0 0.7 0.8  AST 19 21 15  14*  ALT 14 13 9 13   ALKPHOS 67 60 56 49  PROT 7.3 7.8 7.8 8.0  ALBUMIN 4.4 4.4 4.4 3.6    Assessment and Plan:  53 y.o. male with head/ neck CA s/p pull through G tube placement on 1/29 presented to [facility] IR on 2/3 for G tube eval due to pain and discharge, found to have copious purulent discharge, large area of induration  concerning for sq abscess/cellulitis, patient was sent to ED for further eval and  management, CT showed mild stranding surrounding the stomach at the level of the gastrostomy tube without fluid collection or free air, subcutaneous and intramuscular edema/stranding at the level of the gastrostomy tube with no drainable fluid collections. Patient was admitted and being treated  for cellulitis.   The skin around the G tube is much better, there is still a small area of erythematous, hardened, and tender skin around the G tube insertion site w/o any finding suggestive of sq abscess. Most likely remaining cellulitis, pt in IV abx. The tube is being used, no discharge found.    Further treatment plan per [facility].  Appreciate and agree with the plan.  IR to follow.    Electronically Signed: Toya VEAR Cousin, PA-C 05/06/2023, 10:23 AM   I spent a total of 15 Minutes at the the patient's bedside AND on the patient's hospital floor or unit, greater than 50% of which was counseling/coordinating care for G tube eval/ cellulitis.   This chart was dictated using voice recognition software.  Despite best efforts to proofread,  errors can occur which can change the documentation meaning.

## 2023-05-06 NOTE — Progress Notes (Signed)
 Pharmacy Antibiotic Note  [name] is a 53 y.o. male admitted on 05/03/2023 with G tube (placed by IR on 04/28/2023) site infection with surrounding abdominal cellulitis .  Pharmacy has been consulted for vancomycin  dosing.  Today. 05/06/23 Serum creatinine up to 0.9 and est CrCl 79 ml/min; estimated AUC re-calculated to reach goal AUC  Plan: Change vancomycin  to 1750 mg IV q24h (Goal AUC 400-550, eAUC 528.2, SCr used: 0.9) Monitor clinical progress, renal function, vancomycin  levels as indicated F/U C&S, abx deescalation / LOT   Height: 5' 4 (162.6 cm) Weight: 66.1 kg (145 lb 11.6 oz) IBW/kg (Calculated) : 59.2  Temp (24hrs), Avg:98.8 F (37.1 C), Min:98.6 F (37 C), Max:98.9 F (37.2 C)  Recent Labs  Lab 04/30/23 1425 05/03/23 1352 05/03/23 1833 05/04/23 0557 05/04/23 0558 05/05/23 0007 05/06/23 0159  WBC 7.0 8.9  --  7.9  --  5.0 4.2  CREATININE 1.08 0.82  --   --  1.03 0.63 0.90  LATICACIDVEN  --   --  0.5  --   --   --   --     Estimated Creatinine Clearance: 78.6 mL/min (by C-G formula based on SCr of 0.9 mg/dL).    No Known Allergies  Antimicrobials this admission: Vanc 2/3 > CTX 2/3 >  Dose adjustments this admission: -2/4: Vanc 1750 q24h > 1500 q24h - 2/5: Vanc 1500mg /24h> 1g/12 (AUC 540, Scr 0.8) -2/6 change vanc back to 1750 mg IV q24h (eAUC 528.2, SCr 0.9)  Microbiology results: 2/4 BCx: NGTD 2/3 resp panel: RSV +  Thank you for allowing pharmacy to be a part of this patient's care.  Eleanor Agent, PharmD, BCPS 05/06/2023 7:27 AM

## 2023-05-06 NOTE — Progress Notes (Signed)
 [facility]  HEMATOLOGY/ONCOLOGY IN-PATIENT PROGRESS NOTE   PATIENT NAME: [name]   MR#: [id number] DOB: 1970/09/17 CSN#: [id number]   DATE OF SERVICE: 05/06/2023  ASSESSMENT & PLAN:   [name] is a 53 y.o. 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  He started concurrent chemoradiation with weekly cisplatin  during the week of 04/14/2023.  He was admitted to the hospital on 05/03/2023 after he presented with abdominal pain, swelling and redness around feeding tube site.  He is currently receiving IV antibiotics with resolution of cellulitis.  -Given ongoing infection concerns, we skipped chemotherapy this week.  On exam today, cellulitis is almost resolved.  Patient does not have any fevers or chills.  Subjectively he is feeling a lot better.  He is scheduled to see me in clinic tomorrow but we will skip that appointment.  Labs reviewed from today and no dose-limiting toxicities noted.  Will proceed with next cycle of chemotherapy on 05/10/2023 as scheduled, without any dose adjustments.  Patient agreeable with this plan.  Rest of the management is as per primary team.  Please call with any questions.  Discharge planning  Anticipated discharge tomorrow, 05/07/2023.  We will resume chemotherapy on 05/10/2023 as scheduled.  Chinita Patten, MD 05/06/2023 3:30 PM  ONCOLOGY HISTORY:   He initially presented to the [facility] on 10/28/22 with c/o left ear pain, popping with chewing, and pain radiating down the side of his neck and throat x 1 month. He was treated for a left ear infection with a course of prednisone .   His symptoms including left ear and throat pain however persisted and progressed and he was subsequently referred to Dr. Karis on 03/10/23 at [facility] for further  evaluation. During which time, the patient detailed that his pain first began in the ear and eventually progressed to involve the left side of his throat.  A laryngoscopy was subsequently performed at that time which revealed an ulcerative lesion on the posterior aspect of the epiglottis, superior to the vocal cords. The arytenoid mucosa was also mildly edematous and the true vocal folds were pale yellow, mobile, and without mass or lesion.       He accordingly underwent a direct laryngoscopy with biopsies of the left supraglottic tumor/mass on 03/16/23. Pathology showed findings consistent with invasive and in situ moderately differentiated squamous cell carcinoma. Per note by Dr Karis, Examination of the vallecula, piriform sinuses, and the pharyngeal mucosa were all normal. A fungating mass was noted on the posterior aspect of the left epiglottis. The mass did not appear to involve the vocal cords.   Soft tissue neck CT and PET scan on 03/23/23 were done - demonstrating a left supraglottic lesion which may involve the glottis as well as a 7 mm left level 3 lymph node that is suspicious for metastatic involvement.  No distant metastatic disease.   cT3,cN1,cM0, Stage III disease.    Plan made to proceed with concurrent chemoradiation with weekly cisplatin . Radiation started from 04/14/2023. Cisplatin  started from 04/19/2023.   SUBJECTIVE:   Patient seen and evaluated.  He is feeling well overall.  Abdominal pain has resolved.  Tolerating tube feeds well.  Denies any fevers or chills.  OBJECTIVE:  Vitals:   05/06/23 1145 05/06/23 1412  BP: 126/78 (!) 144/96  Pulse: (!) 59 81  Resp: 16 18  Temp: 99.3 F (37.4 C) 99.8 F (37.7 C)  SpO2: 91% 98%     Intake/Output Summary (Last 24 hours) at 05/06/2023 1530 Last data filed at 05/06/2023 1503 Gross per 24 hour  Intake 1548.32 ml  Output 0 ml  Net 1548.32 ml    Physical Exam Constitutional:      General: He is not in acute distress.     Appearance: Normal appearance.  HENT:     Head: Normocephalic and atraumatic.  Eyes:     General: No scleral icterus.    Conjunctiva/sclera: Conjunctivae normal.  Cardiovascular:     Rate and Rhythm: Normal rate and regular rhythm.     Heart sounds: Normal heart sounds.  Pulmonary:     Effort: Pulmonary effort is normal.     Breath sounds: Normal breath sounds.  Chest:     Comments: Right-sided Port-A-Cath in place without any signs of infection. Abdominal:     General: There is no distension.     Comments: Feeding tube in place.  Cellulitis almost resolved.  Musculoskeletal:     Right lower leg: No edema.     Left lower leg: No edema.  Neurological:     General: No focal deficit present.     Mental Status: He is alert and oriented to person, place, and time.  Psychiatric:        Mood and Affect: Mood normal.        Behavior: Behavior normal.        Thought Content: Thought content normal.      LABS:   Results for orders placed or performed during the hospital encounter of 05/03/23 (from the past 24 hours)  Glucose, capillary     Status: Abnormal   Collection Time: 05/05/23  4:54 PM  Result Value Ref Range   Glucose-Capillary 112 (H) 70 - 99 mg/dL  Magnesium      Status: None   Collection Time: 05/05/23  6:05 PM  Result Value Ref Range   Magnesium  2.1 1.7 - 2.4 mg/dL  Phosphorus     Status: None   Collection Time: 05/05/23  6:05 PM  Result Value Ref Range   Phosphorus 3.7 2.5 - 4.6 mg/dL  Glucose, capillary     Status: None   Collection Time: 05/05/23  7:55 PM  Result Value Ref Range   Glucose-Capillary 97 70 - 99 mg/dL  Glucose, capillary     Status: Abnormal   Collection Time: 05/05/23 11:13 PM  Result Value Ref Range   Glucose-Capillary 115 (H) 70 - 99 mg/dL  Basic metabolic panel     Status: Abnormal   Collection Time: 05/06/23  1:59 AM  Result Value Ref Range   Sodium 138 135 - 145 mmol/L   Potassium 3.8 3.5 - 5.1 mmol/L   Chloride 101 98 - 111 mmol/L    CO2 27 22 - 32 mmol/L   Glucose, Bld 124 (H) 70 - 99 mg/dL   BUN 10 6 - 20 mg/dL   Creatinine, Ser 9.09 0.61 - 1.24 mg/dL   Calcium 9.0 8.9 - 89.6 mg/dL   GFR, Estimated >39 >39 mL/min   Anion gap 10 5 - 15  CBC     Status: Abnormal   Collection Time: 05/06/23  1:59 AM  Result Value Ref Range   WBC 4.2 4.0 - 10.5 K/uL   RBC 4.06 (L) 4.22 - 5.81 MIL/uL   Hemoglobin 11.8 (L) 13.0 - 17.0 g/dL   HCT 62.9 (L) 60.9 -  52.0 %   MCV 91.1 80.0 - 100.0 fL   MCH 29.1 26.0 - 34.0 pg   MCHC 31.9 30.0 - 36.0 g/dL   RDW 87.0 88.4 - 84.4 %   Platelets 247 150 - 400 K/uL   nRBC 0.0 0.0 - 0.2 %  Magnesium      Status: None   Collection Time: 05/06/23  1:59 AM  Result Value Ref Range   Magnesium  2.4 1.7 - 2.4 mg/dL  Phosphorus     Status: None   Collection Time: 05/06/23  1:59 AM  Result Value Ref Range   Phosphorus 3.7 2.5 - 4.6 mg/dL  Glucose, capillary     Status: Abnormal   Collection Time: 05/06/23  5:15 AM  Result Value Ref Range   Glucose-Capillary 119 (H) 70 - 99 mg/dL  Glucose, capillary     Status: Abnormal   Collection Time: 05/06/23  7:54 AM  Result Value Ref Range   Glucose-Capillary 124 (H) 70 - 99 mg/dL  Glucose, capillary     Status: Abnormal   Collection Time: 05/06/23 11:41 AM  Result Value Ref Range   Glucose-Capillary 139 (H) 70 - 99 mg/dL     IMAGING STUDIES:   CT ABDOMEN PELVIS W CONTRAST Result Date: 05/03/2023 CLINICAL DATA:  Peritonitis or perforation suspected. Abdominal pain. Nausea. Gastrostomy tube insertion. EXAM: CT ABDOMEN AND PELVIS WITH CONTRAST TECHNIQUE: Multidetector CT imaging of the abdomen and pelvis was performed using the standard protocol following bolus administration of intravenous contrast. RADIATION DOSE REDUCTION: This exam was performed according to the departmental dose-optimization program which includes automated exposure control, adjustment of the mA and/or kV according to patient size and/or use of iterative reconstruction technique.  CONTRAST:  OMNIPAQUE  IOHEXOL  300 MG/ML  SOLN COMPARISON:  PET-CT 03/23/2023. FINDINGS: Lower chest: No acute abnormality. Hepatobiliary: No focal liver abnormality is seen. No gallstones, gallbladder wall thickening, or biliary dilatation. Pancreas: Unremarkable. No pancreatic ductal dilatation or surrounding inflammatory changes. Spleen: Normal in size without focal abnormality. Adrenals/Urinary Tract: Subcentimeter rounded hypodensities in the left kidney are too small to characterize, likely cysts. Otherwise, the adrenal glands, kidneys and bladder are within normal limits. Stomach/Bowel: There is a new percutaneous gastrostomy tube in the body of the stomach. There is mild stranding surrounding the stomach at the level of the gastrostomy tube without fluid collection or free air. The stomach otherwise appears within normal limits. Appendix is not seen. No evidence of other bowel wall thickening, distention, or inflammatory changes. Vascular/Lymphatic: Aortic atherosclerosis. No enlarged abdominal or pelvic lymph nodes. Reproductive: Prostate is unremarkable. Other: There is a small fat containing umbilical hernia. There is no free fluid or free air. There some subcutaneous and intramuscular edema/stranding at the level of the gastrostomy. No drainable fluid collections are seen. No soft tissue gas. Musculoskeletal: No acute or significant osseous findings. IMPRESSION: 1. New percutaneous gastrostomy tube in the body of the stomach. There is mild stranding surrounding the stomach at the level of the gastrostomy tube without fluid collection or free air. 2. Subcutaneous and intramuscular edema/stranding at the level of the gastrostomy tube. No drainable fluid collections. 3. No other acute localizing process in the abdomen or pelvis. Aortic Atherosclerosis (ICD10-I70.0). Electronically Signed   By: Greig Pique M.D.   On: 05/03/2023 17:35   DG Chest 2 View Result Date: 05/03/2023 CLINICAL DATA:   Productive cough.  Laryngeal carcinoma. EXAM: CHEST - 2 VIEW COMPARISON:  None Available. FINDINGS: The heart size and mediastinal contours are within normal limits.  Right-sided Port-A-Cath is seen in appropriate position. Both lungs are clear. The visualized skeletal structures are unremarkable. IMPRESSION: No active cardiopulmonary disease. Electronically Signed   By: Norleen DELENA Kil M.D.   On: 05/03/2023 15:36   IR PATIENT EVAL TECH 0-60 MINS Result Date: 05/03/2023 Katha Harlene ORN, RT     05/03/2023  2:21 PM Patient presented to IR today with complaints of redness, tenderness, and swelling around newly placed gastrostomy tube site.  Gastrostomy tube bumper appeared to be tight to skin, likely due to swelling.  After bumper was loosened, pus came out from around the tube site. At this time, S. Neita, [place] consulted D. Johann, MD, who advised patient to go to Emergency Department to be treated for infection.   IR GASTROSTOMY TUBE MOD SED Result Date: 04/28/2023 CLINICAL DATA:  Laryngeal carcinoma, needs gastrostomy for enteral feeding support EXAM: PERC PLACEMENT GASTROSTOMY FLUOROSCOPY: Radiation Exposure Index (as provided by the fluoroscopic device): 13 mGy air Kerma TECHNIQUE: The procedure, risks, benefits, and alternatives were explained to the patient. Questions regarding the procedure were encouraged and answered. The patient understands and consents to the procedure. As antibiotic prophylaxis, cefazolin  2 g was ordered pre-procedure and administered intravenously within one hour of incision. Progression of previously administered oral barium into the colon was confirmed fluoroscopically. A 5 French angiographic catheter was placed as orogastric tube. The upper abdomen was prepped with Betadine , draped in usual sterile fashion, and infiltrated locally with 1% lidocaine . Intravenous Fentanyl  50mcg and Versed  1mg  were administered by RN during a total moderate (conscious) sedation time of 10 minutes; the  patient's level of consciousness and physiological / cardiorespiratory status were monitored continuously by radiology RN under my direct supervision. 0.5 mg glucagon  given IV to facilitate gastric distention.Stomach was insufflated using air through the orogastric tube. An 29 French sheath needle was advanced percutaneously into the gastric lumen under fluoroscopy. Gas could be aspirated and a small contrast injection confirmed intraluminal spread. The sheath was exchanged over a guidewire for a 9 French vascular sheath, through which the snare device was advanced and used to snare a guidewire passed through the orogastric tube. This was withdrawn, and the snare attached to the 20 French pull-through gastrostomy tube, which was advanced antegrade, positioned with the internal bumper securing the anterior gastric wall to the anterior abdominal wall. Small contrast injection confirms appropriate positioning. The external bumper was applied and the catheter was flushed. COMPLICATIONS: COMPLICATIONS none IMPRESSION: 1. Technically successful 20 French pull-through gastrostomy placement under fluoroscopy. Electronically Signed   By: JONETTA Johann M.D.   On: 04/28/2023 15:03   IR IMAGING GUIDED PORT INSERTION Result Date: 04/28/2023 CLINICAL DATA:  Laryngeal carcinoma, needs durable venous access for planned treatment regimen EXAM: TUNNELED PORT CATHETER PLACEMENT WITH ULTRASOUND AND FLUOROSCOPIC GUIDANCE FLUOROSCOPY: Radiation Exposure Index (as provided by the fluoroscopic device): Less than 0.1 mGy air Kerma ANESTHESIA/SEDATION: Intravenous Fentanyl  100mcg and Versed  2mg  were administered by RN during a total moderate (conscious) sedation time of 10 minutes; the patient's level of consciousness and physiological / cardiorespiratory status were monitored continuously by radiology RN under my direct supervision. TECHNIQUE: The procedure, risks, benefits, and alternatives were explained to the patient. Questions  regarding the procedure were encouraged and answered. The patient understands and consents to the procedure. Patency of the right IJ vein was confirmed with ultrasound with image documentation. An appropriate skin site was determined. Skin site was marked. Region was prepped using maximum barrier technique including cap and mask, sterile gown, sterile gloves,  large sterile sheet, and Chlorhexidine  as cutaneous antisepsis. The region was infiltrated locally with 1% lidocaine . Under real-time ultrasound guidance, the right IJ vein was accessed with a 21 gauge micropuncture needle; the needle tip within the vein was confirmed with ultrasound image documentation. Needle was exchanged over a 018 guidewire for transitional dilator, and vascular measurement was performed. A small incision was made on the right anterior chest wall and a subcutaneous pocket fashioned. The power-injectable port was positioned and its catheter tunneled to the right IJ dermatotomy site. The transitional dilator was exchanged over an Amplatz wire for a peel-away sheath, through which the port catheter, which had been trimmed to the appropriate length, was advanced and positioned under fluoroscopy with its tip at the cavoatrial junction. Spot chest radiograph confirms good catheter position and no pneumothorax. The port was flushed per protocol. The pocket was closed with deep interrupted and subcuticular continuous 3-0 Monocryl sutures. The incisions were covered with Dermabond then covered with a sterile dressing. The patient tolerated the procedure well. COMPLICATIONS: COMPLICATIONS None immediate IMPRESSION: Technically successful right IJ power-injectable port catheter placement. Ready for routine use. Electronically Signed   By: JONETTA Faes M.D.   On: 04/28/2023 15:01

## 2023-05-06 NOTE — Plan of Care (Signed)
  Problem: Activity: Goal: Risk for activity intolerance will decrease Outcome: Completed/Met

## 2023-05-07 ENCOUNTER — Ambulatory Visit: Payer: Medicare Other | Admitting: Oncology

## 2023-05-07 ENCOUNTER — Inpatient Hospital Stay: Payer: Medicare HMO

## 2023-05-07 ENCOUNTER — Inpatient Hospital Stay: Payer: Medicare HMO | Admitting: Oncology

## 2023-05-07 ENCOUNTER — Other Ambulatory Visit: Payer: Self-pay

## 2023-05-07 ENCOUNTER — Ambulatory Visit
Admission: RE | Admit: 2023-05-07 | Discharge: 2023-05-07 | Disposition: A | Discharge status: Home / Self Care | Payer: Medicare HMO | Source: Ambulatory Visit | Attending: Radiation Oncology | Admitting: Radiation Oncology

## 2023-05-07 DIAGNOSIS — K9422 Gastrostomy infection: Secondary | ICD-10-CM | POA: Diagnosis not present

## 2023-05-07 DIAGNOSIS — L089 Local infection of the skin and subcutaneous tissue, unspecified: Secondary | ICD-10-CM | POA: Diagnosis not present

## 2023-05-07 LAB — RAD ONC ARIA SESSION SUMMARY
Course Elapsed Days: 23
Plan Fractions Treated to Date: 18
Plan Prescribed Dose Per Fraction: 2 Gy
Plan Total Fractions Prescribed: 35
Plan Total Prescribed Dose: 70 Gy
Reference Point Dosage Given to Date: 36 Gy
Reference Point Session Dosage Given: 2 Gy
Session Number: 18

## 2023-05-07 LAB — MAGNESIUM: Magnesium: 2.2 mg/dL (ref 1.7–2.4)

## 2023-05-07 LAB — CBC
HCT: 36.4 % — ABNORMAL LOW (ref 39.0–52.0)
Hemoglobin: 11.7 g/dL — ABNORMAL LOW (ref 13.0–17.0)
MCH: 29.4 pg (ref 26.0–34.0)
MCHC: 32.1 g/dL (ref 30.0–36.0)
MCV: 91.5 fL (ref 80.0–100.0)
Platelets: 279 10*3/uL (ref 150–400)
RBC: 3.98 MIL/uL — ABNORMAL LOW (ref 4.22–5.81)
RDW: 12.8 % (ref 11.5–15.5)
WBC: 4.2 10*3/uL (ref 4.0–10.5)
nRBC: 0 % (ref 0.0–0.2)

## 2023-05-07 LAB — BASIC METABOLIC PANEL
Anion gap: 9 (ref 5–15)
BUN: 11 mg/dL (ref 6–20)
CO2: 26 mmol/L (ref 22–32)
Calcium: 9.1 mg/dL (ref 8.9–10.3)
Chloride: 104 mmol/L (ref 98–111)
Creatinine, Ser: 0.79 mg/dL (ref 0.61–1.24)
GFR, Estimated: >60 mL/min (ref >60)
Glucose, Bld: 114 mg/dL — ABNORMAL HIGH (ref 70–99)
Potassium: 4.1 mmol/L (ref 3.5–5.1)
Sodium: 139 mmol/L (ref 135–145)

## 2023-05-07 LAB — GLUCOSE, CAPILLARY
Glucose-Capillary: 110 mg/dL — ABNORMAL HIGH (ref 70–99)
Glucose-Capillary: 125 mg/dL — ABNORMAL HIGH (ref 70–99)
Glucose-Capillary: 127 mg/dL — ABNORMAL HIGH (ref 70–99)

## 2023-05-07 MED ORDER — OSMOLITE 1.5 CAL PO LIQD
60.0000 mL/h | ORAL | 0 refills | Status: DC
Start: 2023-05-07 — End: 2023-05-31

## 2023-05-07 MED ORDER — DOXYCYCLINE HYCLATE 100 MG PO TABS: 100.0000 mg | ORAL_TABLET | Freq: Two times a day (BID) | ORAL | 0 refills | Status: AC

## 2023-05-07 MED ORDER — OSMOLITE 1.5 CAL PO LIQD: 60.0000 mL/h | ORAL | 0 refills | Status: DC

## 2023-05-07 MED ORDER — HEPARIN SOD (PORK) LOCK FLUSH 100 UNIT/ML IV SOLN
500.0000 [IU] | INTRAVENOUS | Status: DC | PRN
  Filled 2023-05-07: qty 5

## 2023-05-07 MED ORDER — PROSOURCE TF20 ENFIT COMPATIBL EN LIQD: 60.0000 mL | Freq: Every day | ENTERAL | 0 refills | Status: DC

## 2023-05-07 MED FILL — Fosaprepitant Dimeglumine For IV Infusion 150 MG (Base Eq): INTRAVENOUS | Qty: 5 | Status: AC

## 2023-05-07 NOTE — Progress Notes (Signed)
 Nutrition Follow-up  DOCUMENTATION CODES:   Non-severe (moderate) malnutrition in context of chronic illness  INTERVENTION:  - Patient to discharge home today. Home TF regimen as below via G-tube. 7 Cartons of Osmolite 1.5 per day + an additional free water  from flushes Provides 2488 kcal, 104 gm protein, 2164 ml free water  daily  - Provided home TF instructions for bolus to patient. Discussed slowly increasing as tolerated to goal of 7 cartons daily. Patient to follow up with outpatient oncology dietitian on his next scheduled chemo appointment on 2/10.    NUTRITION DIAGNOSIS:   Moderate Malnutrition related to chronic illness (squamous cell carcinoma of the supraglottis on chemo/radiation) as evidenced by mild fat depletion, mild muscle depletion, percent weight loss (6% in 2 weeks). *ongoing  GOAL:   Patient will meet greater than or equal to 90% of their needs *met with TF  MONITOR:   Diet advancement, Labs, Weight trends, TF tolerance  REASON FOR ASSESSMENT:   Consult Assessment of nutrition requirement/status, Enteral/tube feeding initiation and management  ASSESSMENT:   53 y.o. 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  Patient at goal of 72mL/hr this morning. He reports tolerating well with no issues.   Patient to discharge home today. His next cycle of chemo is scheduled for Monday, 2/10.   Discussed patient with outpatient oncology RD. Plan for patient's home tube feed regimen to be 7 cartons of Osmolite 1.5 daily.   Provided home tube feed instructions for bolus on discharge. Patient reports he was doing about 2 cartons of Osmolite 1.5 per day after PEG placed and before admission.  Discussed slowly increasing to goal regimen of 7  cartons daily. He plans to do 1-2 cartons today after he returns home and then increase over the weekend to 3-5 cartons daily.  Outpatient cancer RD to follow up with patient on Monday to assess tolerance and continue to work with patient on home TF regimen that fits his schedule and is best tolerated.   Patient endorsed understanding of all information discussed. Inquiring about eating after discharge. Encouraged patient to discuss with MD prior to leaving.    Medications reviewed and include: Q4H FWF, 100mg  thiamine  x5 days  Labs: HA1C 5.8 Blood Glucose 110-153 x24 hours   Diet Order:   Diet Order             Diet NPO time specified Except for: Ice Chips, Sips with Meds  Diet effective midnight                   EDUCATION NEEDS:  Education needs have been addressed  Skin:  Skin Assessment: Reviewed RN Assessment  Last BM:  2/5  Height:  Ht Readings from Last 1 Encounters:  05/04/23 5' 4 (1.626 m)   Weight:  Wt Readings from Last 1 Encounters:  05/07/23 65.3 kg   BMI:  Body mass index is 24.71 kg/m.  Estimated Nutritional Needs:  Kcal:  2000-2300 kcals Protein:  100-120 grams Fluid:  >/= 2L    Trude Ned RD, LDN Contact via Secure Chat.

## 2023-05-07 NOTE — Discharge Summary (Signed)
 Physician Discharge Summary   Patient: [name] MRN: [id number] DOB: 01-28-1971  Admit date:     05/03/2023  Discharge date: 05/07/23  Discharge Physician: Reyes VEAR Gaw   PCP: Tobie Suzzane POUR, MD   Recommendations at discharge:   Ensure patient has finished his course of doxycycline . Ensure continued resolution of cellulitis around GI tube. Continue outpatient tube feeds and ensure patient understands instructions as per home health RN Follow-up with oncology outpatient  Discharge Diagnoses: Principal Problem:   Skin infection at gastrostomy tube site [facility]) Active Problems:   Essential hypertension   Malignant neoplasm of supraglottis (HCC)   RSV infection   Malnutrition of moderate degree   Cellulitis of abdominal wall  Brief Narrative:    53 y.o. 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  Patient continued to exhibit improvement on IV antibiotics.  Switch to p.o. doxycycline  as had purulent drainage and able to discharge home due to resolution of cellulitis     Cellulitis of abdomen with infection at gastrostomy tube site: G-tube placed by IR on 04/28/2023, now complicated by G-tube site infection with surrounding abdominal wall cellulitis.  CT A/P without evidence of fluid collection or free air.  Evaluated by IR, no intervention at this time.  Okay to use his feeding tube.  On IV antibiotics as symptoms are improving.  Cultures remain negative. - Dietitian following.  -Completely resolved on day of discharge. -Following IDSA algorithm this was purulent discharge and thus concern for possible MRSA.  Therefore switched to p.o. doxycycline .    RSV positive: Mild URI symptoms.  CXR without evidence of  superimposed pneumonia.  IS/ bronchodilators.    Supraglottic cancer: On active radiation and chemotherapy with cisplatin .  Follows outpatient radiation and medical oncology -Continue lidocaine  solution qid, scopolamine  patch, Pepcid    Hypertension: BP is stable.  Discharged on home blood pressure medicines   Depression/anxiety: Continue Prozac , Seroquel , and Atarax  as needed.        Disposition: Home Diet recommendation:  Discharge Diet Orders (From admission, onward)     Start     Ordered   05/07/23 0000  Diet general        05/07/23 1147           Diet plan: Tube feeds DISCHARGE MEDICATION: Allergies as of 05/07/2023   No Known Allergies      Medication List     TAKE these medications    acetaminophen  500 MG tablet Commonly known as: TYLENOL  Take 1,000 mg by mouth every 6 (six) hours as needed for mild pain (pain score 1-3) or moderate pain (pain score 4-6).   amlodipine -olmesartan  10-20 MG tablet Commonly known as: AZOR  Take 1 tablet by mouth daily.   dexamethasone  4 MG tablet Commonly known as: DECADRON  Take 2 tablets (8 mg) by mouth daily x 3 days starting the day after cisplatin  chemotherapy. Take with food.   doxycycline  100 MG tablet Commonly known as: VIBRA -TABS Take 1 tablet (100 mg total) by mouth 2 (two) times daily for 6 days.   famotidine  40 MG tablet Commonly known as: Pepcid  Take 1 tablet (40 mg total) by mouth daily.   feeding supplement (OSMOLITE 1.5 CAL) Liqd Place 60 mL/hr into feeding tube continuous.   feeding supplement (PROSource TF20) liquid Place 60 mLs into feeding tube  daily. Start taking on: May 08, 2023   FLUoxetine  20 MG capsule Commonly known as: PROZAC  Take 1 capsule (20 mg total) by mouth daily.   fluticasone  50 MCG/ACT nasal spray Commonly known as: FLONASE  Place 1 spray into both nostrils 2 (two) times daily.   HYDROcodone -acetaminophen  5-325 MG tablet Commonly known as: Norco Take 1-2 tablets by  mouth every 6 (six) hours as needed for moderate pain (pain score 4-6).   hydrOXYzine  25 MG tablet Commonly known as: ATARAX  Take 1 tablet (25 mg total) by mouth 3 (three) times daily as needed for anxiety.   lidocaine  2 % solution Commonly known as: XYLOCAINE  Patient: Mix 1part 2% viscous lidocaine , 1part H20. Swallow 10mL of diluted mixture, before meals and at bedtime, up to QID   lidocaine -prilocaine  cream Commonly known as: EMLA  Apply to affected area once   metoprolol  succinate 25 MG 24 hr tablet Commonly known as: TOPROL -XL Take 1 tablet (25 mg total) by mouth daily.   nicotine  21 mg/24hr patch Commonly known as: NICODERM CQ  - dosed in mg/24 hours Place 1 patch (21 mg total) onto the skin daily. Apply 21 mg patch daily x 6 wk, then 14mg  patch daily x 2 wk, then 7 mg patch daily x 2 wk   nicotine  14 mg/24hr patch Commonly known as: NICODERM CQ  - dosed in mg/24 hours Place 1 patch (14 mg total) onto the skin daily. Apply 21 mg patch daily x 6 wk, then 14mg  patch daily x 2 wk, then 7 mg patch daily x 2 wk   nicotine  7 mg/24hr patch Commonly known as: NICODERM CQ  - dosed in mg/24 hr Place 1 patch (7 mg total) onto the skin daily. Apply 21 mg patch daily x 6 wk, then 14mg  patch daily x 2 wk, then 7 mg patch daily x 2 wk   ondansetron  8 MG tablet Commonly known as: Zofran  Take 1 tablet (8 mg total) by mouth every 8 (eight) hours as needed for nausea or vomiting. Start on the third day after cisplatin .   prochlorperazine  10 MG tablet Commonly known as: COMPAZINE  TAKE 1 TABLET(10 MG) BY MOUTH EVERY 6 HOURS AS NEEDED FOR NAUSEA OR VOMITING   QUEtiapine  50 MG tablet Commonly known as: SEROQUEL  Take one pill nightly for 1 week. Then increase to 2 pills nightly for 1 week. Then increase to 3 pills nightly for one week. Then increase to 4 pills nightly for one week.   QUEtiapine  200 MG tablet Commonly known as: SEROquel  Take 1 tablet (200 mg total) by mouth at bedtime.    scopolamine  1 MG/3DAYS Commonly known as: TRANSDERM-SCOP Place 1 patch (1.5 mg total) onto the skin every 3 (three) days.        Discharge Exam: Filed Weights   05/04/23 1344 05/06/23 0711 05/07/23 0701  Weight: 66.1 kg 65.3 kg 65.3 kg   Examination:   General exam: Appears calm and comfortable  Respiratory system: Clear to auscultation. Respiratory effort normal. Cardiovascular system: S1 & S2 heard, RRR. No JVD, murmurs, rubs, gallops or clicks. No pedal edema. Gastrointestinal system: Abdomen is nondistended, soft and nontender. No organomegaly or masses felt. Normal bowel sounds heard. Central nervous system: Alert and oriented. No focal neurological deficits. Extremities: Symmetric 5 x 5 power. Skin: Peg tube with no further surrounding erythema.  Nontender to palpation Psychiatry: Judgement and insight appear normal. Mood & affect appropriate.    Condition at discharge: good  The results of significant diagnostics from this hospitalization (including imaging, microbiology,  ancillary and laboratory) are listed below for reference.   Imaging Studies: CT ABDOMEN PELVIS W CONTRAST Result Date: 05/03/2023 CLINICAL DATA:  Peritonitis or perforation suspected. Abdominal pain. Nausea. Gastrostomy tube insertion. EXAM: CT ABDOMEN AND PELVIS WITH CONTRAST TECHNIQUE: Multidetector CT imaging of the abdomen and pelvis was performed using the standard protocol following bolus administration of intravenous contrast. RADIATION DOSE REDUCTION: This exam was performed according to the departmental dose-optimization program which includes automated exposure control, adjustment of the mA and/or kV according to patient size and/or use of iterative reconstruction technique. CONTRAST:  OMNIPAQUE  IOHEXOL  300 MG/ML  SOLN COMPARISON:  PET-CT 03/23/2023. FINDINGS: Lower chest: No acute abnormality. Hepatobiliary: No focal liver abnormality is seen. No gallstones, gallbladder wall thickening, or  biliary dilatation. Pancreas: Unremarkable. No pancreatic ductal dilatation or surrounding inflammatory changes. Spleen: Normal in size without focal abnormality. Adrenals/Urinary Tract: Subcentimeter rounded hypodensities in the left kidney are too small to characterize, likely cysts. Otherwise, the adrenal glands, kidneys and bladder are within normal limits. Stomach/Bowel: There is a new percutaneous gastrostomy tube in the body of the stomach. There is mild stranding surrounding the stomach at the level of the gastrostomy tube without fluid collection or free air. The stomach otherwise appears within normal limits. Appendix is not seen. No evidence of other bowel wall thickening, distention, or inflammatory changes. Vascular/Lymphatic: Aortic atherosclerosis. No enlarged abdominal or pelvic lymph nodes. Reproductive: Prostate is unremarkable. Other: There is a small fat containing umbilical hernia. There is no free fluid or free air. There some subcutaneous and intramuscular edema/stranding at the level of the gastrostomy. No drainable fluid collections are seen. No soft tissue gas. Musculoskeletal: No acute or significant osseous findings. IMPRESSION: 1. New percutaneous gastrostomy tube in the body of the stomach. There is mild stranding surrounding the stomach at the level of the gastrostomy tube without fluid collection or free air. 2. Subcutaneous and intramuscular edema/stranding at the level of the gastrostomy tube. No drainable fluid collections. 3. No other acute localizing process in the abdomen or pelvis. Aortic Atherosclerosis (ICD10-I70.0). Electronically Signed   By: Greig Pique M.D.   On: 05/03/2023 17:35   DG Chest 2 View Result Date: 05/03/2023 CLINICAL DATA:  Productive cough.  Laryngeal carcinoma. EXAM: CHEST - 2 VIEW COMPARISON:  None Available. FINDINGS: The heart size and mediastinal contours are within normal limits. Right-sided Port-A-Cath is seen in appropriate position. Both lungs  are clear. The visualized skeletal structures are unremarkable. IMPRESSION: No active cardiopulmonary disease. Electronically Signed   By: Norleen DELENA Kil M.D.   On: 05/03/2023 15:36   IR PATIENT EVAL TECH 0-60 MINS Result Date: 05/03/2023 Katha Harlene ORN, RT     05/03/2023  2:21 PM Patient presented to IR today with complaints of redness, tenderness, and swelling around newly placed gastrostomy tube site.  Gastrostomy tube bumper appeared to be tight to skin, likely due to swelling.  After bumper was loosened, pus came out from around the tube site. At this time, S. Neita, [place] consulted D. Johann, MD, who advised patient to go to Emergency Department to be treated for infection.   IR GASTROSTOMY TUBE MOD SED Result Date: 04/28/2023 CLINICAL DATA:  Laryngeal carcinoma, needs gastrostomy for enteral feeding support EXAM: PERC PLACEMENT GASTROSTOMY FLUOROSCOPY: Radiation Exposure Index (as provided by the fluoroscopic device): 13 mGy air Kerma TECHNIQUE: The procedure, risks, benefits, and alternatives were explained to the patient. Questions regarding the procedure were encouraged and answered. The patient understands and consents to the procedure. As antibiotic  prophylaxis, cefazolin  2 g was ordered pre-procedure and administered intravenously within one hour of incision. Progression of previously administered oral barium into the colon was confirmed fluoroscopically. A 5 French angiographic catheter was placed as orogastric tube. The upper abdomen was prepped with Betadine , draped in usual sterile fashion, and infiltrated locally with 1% lidocaine . Intravenous Fentanyl  50mcg and Versed  1mg  were administered by RN during a total moderate (conscious) sedation time of 10 minutes; the patient's level of consciousness and physiological / cardiorespiratory status were monitored continuously by radiology RN under my direct supervision. 0.5 mg glucagon  given IV to facilitate gastric distention.Stomach was insufflated  using air through the orogastric tube. An 34 French sheath needle was advanced percutaneously into the gastric lumen under fluoroscopy. Gas could be aspirated and a small contrast injection confirmed intraluminal spread. The sheath was exchanged over a guidewire for a 9 French vascular sheath, through which the snare device was advanced and used to snare a guidewire passed through the orogastric tube. This was withdrawn, and the snare attached to the 20 French pull-through gastrostomy tube, which was advanced antegrade, positioned with the internal bumper securing the anterior gastric wall to the anterior abdominal wall. Small contrast injection confirms appropriate positioning. The external bumper was applied and the catheter was flushed. COMPLICATIONS: COMPLICATIONS none IMPRESSION: 1. Technically successful 20 French pull-through gastrostomy placement under fluoroscopy. Electronically Signed   By: JONETTA Faes M.D.   On: 04/28/2023 15:03   IR IMAGING GUIDED PORT INSERTION Result Date: 04/28/2023 CLINICAL DATA:  Laryngeal carcinoma, needs durable venous access for planned treatment regimen EXAM: TUNNELED PORT CATHETER PLACEMENT WITH ULTRASOUND AND FLUOROSCOPIC GUIDANCE FLUOROSCOPY: Radiation Exposure Index (as provided by the fluoroscopic device): Less than 0.1 mGy air Kerma ANESTHESIA/SEDATION: Intravenous Fentanyl  100mcg and Versed  2mg  were administered by RN during a total moderate (conscious) sedation time of 10 minutes; the patient's level of consciousness and physiological / cardiorespiratory status were monitored continuously by radiology RN under my direct supervision. TECHNIQUE: The procedure, risks, benefits, and alternatives were explained to the patient. Questions regarding the procedure were encouraged and answered. The patient understands and consents to the procedure. Patency of the right IJ vein was confirmed with ultrasound with image documentation. An appropriate skin site was determined. Skin  site was marked. Region was prepped using maximum barrier technique including cap and mask, sterile gown, sterile gloves, large sterile sheet, and Chlorhexidine  as cutaneous antisepsis. The region was infiltrated locally with 1% lidocaine . Under real-time ultrasound guidance, the right IJ vein was accessed with a 21 gauge micropuncture needle; the needle tip within the vein was confirmed with ultrasound image documentation. Needle was exchanged over a 018 guidewire for transitional dilator, and vascular measurement was performed. A small incision was made on the right anterior chest wall and a subcutaneous pocket fashioned. The power-injectable port was positioned and its catheter tunneled to the right IJ dermatotomy site. The transitional dilator was exchanged over an Amplatz wire for a peel-away sheath, through which the port catheter, which had been trimmed to the appropriate length, was advanced and positioned under fluoroscopy with its tip at the cavoatrial junction. Spot chest radiograph confirms good catheter position and no pneumothorax. The port was flushed per protocol. The pocket was closed with deep interrupted and subcuticular continuous 3-0 Monocryl sutures. The incisions were covered with Dermabond then covered with a sterile dressing. The patient tolerated the procedure well. COMPLICATIONS: COMPLICATIONS None immediate IMPRESSION: Technically successful right IJ power-injectable port catheter placement. Ready for routine use. Electronically Signed  By: JONETTA Faes M.D.   On: 04/28/2023 15:01    Microbiology: Results for orders placed or performed during the hospital encounter of 05/03/23  Blood culture (routine x 2)     Status: None (Preliminary result)   Collection Time: 05/03/23  1:52 PM   Specimen: BLOOD  Result Value Ref Range Status   Specimen Description   Final    BLOOD LEFT ANTECUBITAL Performed at [facility], [address], [place], [place] [address]     Special Requests   Final    BOTTLES DRAWN AEROBIC AND ANAEROBIC Blood Culture adequate volume Performed at [facility], [address], [place], [place] [address]    Culture   Final    NO GROWTH 3 DAYS Performed at [facility], [address], [place], [place] [address]    Report Status PENDING  Incomplete  Resp panel by RT-PCR (RSV, Flu A&B, Covid) Anterior Nasal Swab     Status: Abnormal   Collection Time: 05/03/23  2:18 PM   Specimen: Anterior Nasal Swab  Result Value Ref Range Status   SARS Coronavirus 2 by RT PCR NEGATIVE NEGATIVE Final    Comment: (NOTE) SARS-CoV-2 target nucleic acids are NOT DETECTED.  The SARS-CoV-2 RNA is generally detectable in upper respiratory specimens during the acute phase of infection. The lowest concentration of SARS-CoV-2 viral copies this assay can detect is 138 copies/mL. A negative result does not preclude SARS-Cov-2 infection and should not be used as the sole basis for treatment or other patient management decisions. A negative result may occur with  improper specimen collection/handling, submission of specimen other than nasopharyngeal swab, presence of viral mutation(s) within the areas targeted by this assay, and inadequate number of viral copies(<138 copies/mL). A negative result must be combined with clinical observations, patient history, and epidemiological information. The expected result is Negative.  Fact Sheet for Patients:  bloggercourse.com  Fact Sheet for Healthcare Providers:  seriousbroker.it  This test is no t yet approved or cleared by the [place]  FDA and  has been authorized for detection and/or diagnosis of SARS-CoV-2 by FDA under an Emergency Use Authorization (EUA). This EUA will remain  in effect (meaning this test can be used) for the duration of the COVID-19 declaration under Section 564(b)(1) of the Act, 21 U.S.C.section  360bbb-3(b)(1), unless the authorization is terminated  or revoked sooner.       Influenza A by PCR NEGATIVE NEGATIVE Final   Influenza B by PCR NEGATIVE NEGATIVE Final    Comment: (NOTE) The Xpert Xpress SARS-CoV-2/FLU/RSV plus assay is intended as an aid in the diagnosis of influenza from Nasopharyngeal swab specimens and should not be used as a sole basis for treatment. Nasal washings and aspirates are unacceptable for Xpert Xpress SARS-CoV-2/FLU/RSV testing.  Fact Sheet for Patients: bloggercourse.com  Fact Sheet for Healthcare Providers: seriousbroker.it  This test is not yet approved or cleared by the [place]  FDA and has been authorized for detection and/or diagnosis of SARS-CoV-2 by FDA under an Emergency Use Authorization (EUA). This EUA will remain in effect (meaning this test can be used) for the duration of the COVID-19 declaration under Section 564(b)(1) of the Act, 21 U.S.C. section 360bbb-3(b)(1), unless the authorization is terminated or revoked.     Resp Syncytial Virus by PCR POSITIVE (A) NEGATIVE Final    Comment: (NOTE) Fact Sheet for Patients: bloggercourse.com  Fact Sheet for Healthcare Providers: seriousbroker.it  This test is not yet approved or cleared  by the [place]  FDA and has been authorized for detection and/or diagnosis of SARS-CoV-2 by FDA under an Emergency Use Authorization (EUA). This EUA will remain in effect (meaning this test can be used) for the duration of the COVID-19 declaration under Section 564(b)(1) of the Act, 21 U.S.C. section 360bbb-3(b)(1), unless the authorization is terminated or revoked.  Performed at [facility], [address], [place], [place] [address]   Blood culture (routine x 2)     Status: None (Preliminary result)   Collection Time: 05/04/23  5:58 AM   Specimen: BLOOD  Result  Value Ref Range Status   Specimen Description   Final    BLOOD SITE NOT SPECIFIED Performed at [facility], [address], [place], [place] [address]    Special Requests   Final    BOTTLES DRAWN AEROBIC AND ANAEROBIC Blood Culture adequate volume Performed at [facility], [address] [place], [place] [address]    Culture   Final    NO GROWTH 3 DAYS Performed at [facility], [address] [place], [place] [address]    Report Status PENDING  Incomplete    Labs: CBC: Recent Labs  Lab 04/30/23 1425 05/03/23 1352 05/04/23 0557 05/05/23 0007 05/06/23 0159 05/07/23 0213  WBC 7.0 8.9 7.9 5.0 4.2 4.2  NEUTROABS 5.4 7.1  --   --   --   --   HGB 13.0 13.3 12.8* 12.0* 11.8* 11.7*  HCT 39.2 40.9 38.7* 37.7* 37.0* 36.4*  MCV 87.7 91.3 92.1 91.5 91.1 91.5  PLT 122* 170 168 213 247 279   Basic Metabolic Panel: Recent Labs  Lab 05/03/23 1352 05/04/23 0558 05/05/23 0007 05/05/23 1805 05/06/23 0159 05/06/23 2018 05/07/23 0213  NA 135 135 139  --  138  --  139  K 4.3 4.4 3.8  --  3.8  --  4.1  CL 99 99 102  --  101  --  104  CO2 27 25 27   --  27  --  26  GLUCOSE 101* 98 127*  --  124*  --  114*  BUN 11 13 12   --  10  --  11  CREATININE 0.82 1.03 0.63  --  0.90  --  0.79  CALCIUM 9.0 8.8* 8.7*  --  9.0  --  9.1  MG  --   --  2.4 2.1 2.4 2.1 2.2  PHOS  --   --  4.0 3.7 3.7 4.0  --    Liver Function Tests: Recent Labs  Lab 05/03/23 1352  AST 14*  ALT 13  ALKPHOS 49  BILITOT 0.8  PROT 8.0  ALBUMIN 3.6   CBG: Recent Labs  Lab 05/06/23 1615 05/06/23 2015 05/07/23 0037 05/07/23 0509 05/07/23 0727  GLUCAP 153* 140* 127* 110* 125*    Discharge time spent: less than 30 minutes.  Signed: Reyes VEAR Gaw, MD [facility] 05/07/2023

## 2023-05-07 NOTE — Evaluation (Addendum)
 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 Therefore, SLP and pt together determined to cancel MBS.  MBS can be completed in future PRN if/when pt and Dr Izell indicate.)  Encouraged pt to continue swallowing exercises and po intake as much as possible to prevent disuse atrophy. Thanks.   [name], MS [facility] SLP Acute Rehab Services Office [phone]    Nicolas Emmie Caldron 05/07/2023, 8:27 AM

## 2023-05-07 NOTE — TOC Initial Note (Addendum)
 Transition of Care [facility]) - Initial/Assessment Note    Patient Details  Name: [name] MRN: [id number] Date of Birth: [birth year]  Transition of Care [facility] CM/SW Contact:    Sonda Manuella Quill, RN Phone Number: 05/07/2023, 11:54 AM  Clinical Narrative:                 TOC for d/c planning; spoke w/ pt in room; pt says he lives at home w/ his spouse  [name] [phone]); he plans to return at d/c; pt says his wife will provide transportation; he verified PCP/insurance; pt denies SDOH risks. Pt does not have HH services or home oxygen; per pt he receives bolus TF (Osmolyte 1.5) and supplies from the Dr [name] at the [facility]; per dieticians recc these will continue at d/c; contacted pt's wife and she says he has almost a full box; no TOC needs.  -1215- confirmed w/ [name] at [facility] that pt will need orders for TF and supplies as he received a complimentary start up [facility]; LVM for [name] at [place] to discuss; awaiting return call.  -1221- return call from [name] at [facility]; she says orders can be placed in EPIC for long-term TF and HHRN; Dr [name] notified; also contacted [name] at [facility] to see if agency can provide service; he will contact branch and let this RN, CM know; awaiting decision.  -1250- notified by [name] at [facility] agency can provide service  -1455- received medical necessity form from [facility]; she requested it be faxed to [phone]; Dr [name] notified; awaiting signature; pt's wife [name] notified of providing agencies; pt was also given courtesy bottles of TF to cover patient until delivery from [employer]; contact info for [facility] and [employer] placed in follow up provider section of d/c instructions; no TOC needs.  -1635- Medical Necessity and face sheet faxed; awaiting electronic confirmation  -1636- electronic confirmation received  Expected Discharge Plan: Home/Self Care Barriers to  Discharge: No Barriers Identified   Patient Goals and CMS Choice Patient states their goals for this hospitalization and ongoing recovery are:: home CMS Medicare.gov Compare Post Acute Care list provided to:: Patient        Expected Discharge Plan and Services   Discharge Planning Services: CM Consult Post Acute Care Choice: Resumption of Svcs/PTA Provider ([facility] per [facility]) Living arrangements for the past 2 months: Single Family Home Expected Discharge Date: 05/07/23               DME Arranged: N/A DME Agency: NA       HH Arranged: NA HH Agency: NA        Prior Living Arrangements/Services Living arrangements for the past 2 months: Single Family Home Lives with:: Spouse Patient language and need for interpreter reviewed:: Yes Do you feel safe going back to the place where you live?: Yes      Need for Family Participation in Patient Care: Yes (Comment) Care giver support system in place?: Yes (comment) Current home services: DME (TF per [facility]) Criminal Activity/Legal Involvement Pertinent to Current Situation/Hospitalization: No - Comment as needed  Activities of Daily Living   ADL Screening (condition at time of admission) Independently performs ADLs?: Yes (appropriate for developmental age) Is the patient deaf or have difficulty hearing?: No Does the patient have difficulty seeing, even when wearing glasses/contacts?: No Does the patient have difficulty concentrating, remembering, or making decisions?: No  Permission Sought/Granted Permission sought to share information with : Case  Manager Permission granted to share information with : Yes, Verbal Permission Granted  Share Information with NAME: Case Manager     Permission granted to share info w Relationship: [name] (spouse) [phone]     Emotional Assessment Appearance:: Appears stated age Attitude/Demeanor/Rapport: Gracious Affect (typically observed): Accepting Orientation: :  Oriented to Self, Oriented to Place, Oriented to  Time, Oriented to Situation Alcohol / Substance Use: Not Applicable Psych Involvement: No (comment)  Admission diagnosis:  Cellulitis of abdominal wall [L03.311] Skin infection at gastrostomy tube site [facility]) [X05.77, L08.9] Patient Active Problem List   Diagnosis Date Noted   Malnutrition of moderate degree 05/06/2023   Cellulitis of abdominal wall 05/06/2023   Skin infection at gastrostomy tube site (HCC) 05/03/2023   RSV infection 05/03/2023   Port-A-Cath in place 04/30/2023   Chemotherapy-induced thrombocytopenia 04/30/2023   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   Memory deficit 03/18/2023   Tobacco use disorder 03/18/2023   Panic disorder with agoraphobia 03/18/2023   Left ear pain 03/10/2023   Oropharyngeal dysphagia 03/10/2023   Essential hypertension 02/04/2023   GAD (generalized anxiety disorder) 02/04/2023   Otitis media 02/04/2023   Cocaine use disorder, severe, in sustained remission (HCC)    Major depressive disorder, recurrent episode, severe, 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   Chronic hepatitis C virus infection (HCC) G1a 12/28/2016   GERD (gastroesophageal reflux disease) 12/10/2016   Encounter for antineoplastic chemotherapy 12/10/2016   Alcohol use disorder, severe, dependence in unclear remission status    PCP:  Tobie Suzzane POUR, MD Pharmacy:   [facility] [phone] - [place], [place] - [address] AT [facility] [address] & [address] [place] [address] Phone: [phone] Fax: [phone]  [facility] [place], [place] - [address] [place] [address] Phone: [phone] Fax: [phone]     Social Drivers of Health (SDOH) Social History: SDOH Screenings   Food Insecurity: No Food Insecurity (05/07/2023)  Housing: Low Risk  (05/07/2023)  Transportation Needs: No Transportation Needs (05/07/2023)  Utilities: Not At Risk (05/07/2023)   Alcohol Screen: Medium Risk (06/23/2017)  Depression (PHQ2-9): High Risk (03/29/2023)  Tobacco Use: Medium Risk (05/03/2023)   SDOH Interventions: Food Insecurity Interventions: Intervention Not Indicated, Inpatient TOC Housing Interventions: Intervention Not Indicated, Inpatient TOC Transportation Interventions: Intervention Not Indicated, Inpatient TOC Utilities Interventions: Intervention Not Indicated, Inpatient TOC   Readmission Risk Interventions    05/07/2023   11:49 AM  Readmission Risk Prevention Plan  Transportation Screening Complete  PCP or Specialist Appt within 3-5 Days Complete  HRI or Home Care Consult Complete  Social Work Consult for Recovery Care Planning/Counseling Complete  Palliative Care Screening Not Applicable  Medication Review Oceanographer) Complete

## 2023-05-07 NOTE — Plan of Care (Signed)
   Problem: Education: Goal: Knowledge of General Education information will improve Description: Including pain rating scale, medication(s)/side effects and non-pharmacologic comfort measures Outcome: Progressing   Problem: Coping: Goal: Level of anxiety will decrease Outcome: Progressing

## 2023-05-07 NOTE — Progress Notes (Signed)
 Discharge instructions given to patient and all questions were answered.

## 2023-05-09 LAB — CULTURE, BLOOD (ROUTINE X 2)
Culture: NO GROWTH
Culture: NO GROWTH
Special Requests: ADEQUATE
Special Requests: ADEQUATE

## 2023-05-10 ENCOUNTER — Telehealth: Payer: Self-pay

## 2023-05-10 ENCOUNTER — Ambulatory Visit
Admission: RE | Admit: 2023-05-10 | Discharge: 2023-05-10 | Disposition: A | Discharge status: Home / Self Care | Payer: Medicare HMO | Source: Ambulatory Visit | Attending: Radiation Oncology | Admitting: Radiation Oncology

## 2023-05-10 ENCOUNTER — Other Ambulatory Visit: Payer: Self-pay

## 2023-05-10 ENCOUNTER — Inpatient Hospital Stay: Payer: Medicare HMO

## 2023-05-10 ENCOUNTER — Other Ambulatory Visit: Payer: Self-pay | Admitting: Internal Medicine

## 2023-05-10 VITALS — BP 123/79 | HR 60 | Temp 98.5°F | Resp 16

## 2023-05-10 DIAGNOSIS — L03311 Cellulitis of abdominal wall: Secondary | ICD-10-CM | POA: Diagnosis not present

## 2023-05-10 DIAGNOSIS — I10 Essential (primary) hypertension: Secondary | ICD-10-CM

## 2023-05-10 DIAGNOSIS — I700 Atherosclerosis of aorta: Secondary | ICD-10-CM | POA: Diagnosis not present

## 2023-05-10 DIAGNOSIS — K429 Umbilical hernia without obstruction or gangrene: Secondary | ICD-10-CM | POA: Diagnosis not present

## 2023-05-10 DIAGNOSIS — Z7952 Long term (current) use of systemic steroids: Secondary | ICD-10-CM | POA: Diagnosis not present

## 2023-05-10 DIAGNOSIS — C321 Malignant neoplasm of supraglottis: Secondary | ICD-10-CM

## 2023-05-10 DIAGNOSIS — Z510 Encounter for antineoplastic radiation therapy: Secondary | ICD-10-CM | POA: Diagnosis not present

## 2023-05-10 DIAGNOSIS — Z79899 Other long term (current) drug therapy: Secondary | ICD-10-CM | POA: Diagnosis not present

## 2023-05-10 DIAGNOSIS — Z9221 Personal history of antineoplastic chemotherapy: Secondary | ICD-10-CM | POA: Diagnosis not present

## 2023-05-10 DIAGNOSIS — Z5111 Encounter for antineoplastic chemotherapy: Secondary | ICD-10-CM | POA: Diagnosis not present

## 2023-05-10 DIAGNOSIS — Z923 Personal history of irradiation: Secondary | ICD-10-CM | POA: Diagnosis not present

## 2023-05-10 DIAGNOSIS — R110 Nausea: Secondary | ICD-10-CM | POA: Diagnosis not present

## 2023-05-10 LAB — RAD ONC ARIA SESSION SUMMARY
Course Elapsed Days: 26
Plan Fractions Treated to Date: 19
Plan Prescribed Dose Per Fraction: 2 Gy
Plan Total Fractions Prescribed: 35
Plan Total Prescribed Dose: 70 Gy
Reference Point Dosage Given to Date: 38 Gy
Reference Point Session Dosage Given: 2 Gy
Session Number: 19

## 2023-05-10 MED ORDER — SODIUM CHLORIDE 0.9% FLUSH
10.0000 mL | INTRAVENOUS | Status: DC | PRN
  Administered 2023-05-10: 10 mL

## 2023-05-10 MED ORDER — CISPLATIN CHEMO INJECTION 100MG/100ML
40.0000 mg/m2 | Freq: Once | INTRAVENOUS | Status: AC
  Administered 2023-05-10: 70 mg via INTRAVENOUS
  Filled 2023-05-10: qty 70

## 2023-05-10 MED ORDER — MAGNESIUM SULFATE 2 GM/50ML IV SOLN
2.0000 g | Freq: Once | INTRAVENOUS | Status: AC
Start: 2023-05-10 — End: 2023-05-10
  Administered 2023-05-10: 2 g via INTRAVENOUS
  Filled 2023-05-10: qty 50

## 2023-05-10 MED ORDER — SODIUM CHLORIDE 0.9 % IV SOLN
150.0000 mg | Freq: Once | INTRAVENOUS | Status: AC
  Administered 2023-05-10: 150 mg via INTRAVENOUS
  Filled 2023-05-10: qty 150

## 2023-05-10 MED ORDER — PALONOSETRON HCL INJECTION 0.25 MG/5ML
0.2500 mg | Freq: Once | INTRAVENOUS | Status: AC
  Administered 2023-05-10: 0.25 mg via INTRAVENOUS
  Filled 2023-05-10: qty 5

## 2023-05-10 MED ORDER — SODIUM CHLORIDE 0.9 % IV SOLN: INTRAVENOUS | Status: DC

## 2023-05-10 MED ORDER — DEXAMETHASONE SODIUM PHOSPHATE 10 MG/ML IJ SOLN
10.0000 mg | Freq: Once | INTRAMUSCULAR | Status: AC
  Administered 2023-05-10: 10 mg via INTRAVENOUS
  Filled 2023-05-10: qty 1

## 2023-05-10 MED ORDER — POTASSIUM CHLORIDE IN NACL 20-0.9 MEQ/L-% IV SOLN
Freq: Once | INTRAVENOUS | Status: AC
  Filled 2023-05-10: qty 1000

## 2023-05-10 MED ORDER — HEPARIN SOD (PORK) LOCK FLUSH 100 UNIT/ML IV SOLN
500.0000 [IU] | Freq: Once | INTRAVENOUS | Status: AC | PRN
Start: 2023-05-10 — End: 2023-05-10
  Administered 2023-05-10: 500 [IU]

## 2023-05-10 NOTE — Progress Notes (Signed)
 Nutrition Follow-up:  Patient  with SCC of surpraglottis, stage III. Patient receiving concurrent chemotherapy and radiation.   Met with patient today during infusion.  Reports that he has been giving 1 bottle of tube feeding through tube since discharge (given 1 liter bottle instead of cartons). He has been giving a small amount of formula several times of day.  He has been eating soft foods that he does not have to chew.  Reports awful taste in his mouth.  Eating sherbet during visit.  Sipping on liquids orally.     He has not received any shipment from [employer] for more formula and supplies   Medications: reviewed  Labs: reviewed  Anthropometrics:   Weight 143 lb on 2/7 148 lb on 1/31 151 lb on 1/6   Estimated Energy Needs  Kcals: 2100-2450 Protein: 105-122 g Fluid: > 2.1 L  NUTRITION DIAGNOSIS: Predicted sub optimal energy intake continues    INTERVENTION:  Communicated with Pam, representative for [employer] and formula and supplies should be delivered by 2/13. Written instructions given to patient to give 4 cartons of formula (1 carton QID) with 60ml water  flush before and after.  Once tolerating will add 1 carton daily to current regimen until reaches total of 7 cartons/day.  Instructed to drink additional 3 cups of fluid or put 3 cups water  via tube for additional hydration. Patient verbalized understanding. Continue soft solids orally as tolerated    MONITORING, EVALUATION, GOAL: weight trends, intake, tube feeding   NEXT VISIT: Monday, Feb 17 during infusion  Tayanna Talford B. Leighton Punches, RD, LDN Registered Dietitian [phone]

## 2023-05-10 NOTE — Transitions of Care (Post Inpatient/ED Visit) (Signed)
   05/10/2023  Name: [name] MRN: [id number] DOB: 12-03-1970  Today's TOC FU Call Status: Today's TOC FU Call Status:: Unsuccessful Call (1st Attempt) Unsuccessful Call (1st Attempt) Date: 05/10/23  Attempted to reach the patient regarding the most recent Inpatient/ED visit.  Follow Up Plan: Additional outreach attempts will be made to reach the patient to complete the Transitions of Care (Post Inpatient/ED visit) call.   Signature Germain Kohler, CMA (AAMA)  [facility]- AWV Program [phone]

## 2023-05-10 NOTE — Patient Instructions (Signed)
 [facility] - A DEPT OF [facility]  Discharge Instructions: Thank you for choosing [facility] to provide your oncology and hematology care.   If you have a lab appointment with the Cancer Center, please go directly to the Cancer Center and check in at the registration area.   Wear comfortable clothing and clothing appropriate for easy access to any Portacath or PICC line.   We strive to give you quality time with your provider. You may need to reschedule your appointment if you arrive late (15 or more minutes).  Arriving late affects you and other patients whose appointments are after yours.  Also, if you miss three or more appointments without notifying the office, you may be dismissed from the clinic at the provider's discretion.      For prescription refill requests, have your pharmacy contact our office and allow 72 hours for refills to be completed.    Today you received the following chemotherapy and/or immunotherapy agents cisplatin      To help prevent nausea and vomiting after your treatment, we encourage you to take your nausea medication as directed.  BELOW ARE SYMPTOMS THAT SHOULD BE REPORTED IMMEDIATELY: *FEVER GREATER THAN 100.4 F (38 C) OR HIGHER *CHILLS OR SWEATING *NAUSEA AND VOMITING THAT IS NOT CONTROLLED WITH YOUR NAUSEA MEDICATION *UNUSUAL SHORTNESS OF BREATH *UNUSUAL BRUISING OR BLEEDING *URINARY PROBLEMS (pain or burning when urinating, or frequent urination) *BOWEL PROBLEMS (unusual diarrhea, constipation, pain near the anus) TENDERNESS IN MOUTH AND THROAT WITH OR WITHOUT PRESENCE OF ULCERS (sore throat, sores in mouth, or a toothache) UNUSUAL RASH, SWELLING OR PAIN  UNUSUAL VAGINAL DISCHARGE OR ITCHING   Items with * indicate a potential emergency and should be followed up as soon as possible or go to the Emergency Department if any problems should occur.  Please show the CHEMOTHERAPY ALERT CARD or IMMUNOTHERAPY  ALERT CARD at check-in to the Emergency Department and triage nurse.  Should you have questions after your visit or need to cancel or reschedule your appointment, please contact [facility] - A DEPT OF [facility]  Dept: [phone]  and follow the prompts.  Office hours are 8:00 a.m. to 4:30 p.m. Monday - Friday. Please note that voicemails left after 4:00 p.m. may not be returned until the following business day.  We are closed weekends and major holidays. You have access to a nurse at all times for urgent questions. Please call the main number to the clinic Dept: [phone] and follow the prompts.   For any non-urgent questions, you may also contact your provider using MyChart. We now offer e-Visits for anyone 66 and older to request care online for non-urgent symptoms. For details visit [email or web address].   Also download the MyChart app! Go to the app store, search "MyChart", open the app, select [place], and log in with your MyChart username and password.

## 2023-05-10 NOTE — Transitions of Care (Post Inpatient/ED Visit) (Signed)
   05/10/2023  Name: [name] MRN: [id number] DOB: May 15, 1970  Today's TOC FU Call Status: Today's TOC FU Call Status:: Unsuccessful Call (1st Attempt) Unsuccessful Call (1st Attempt) Date: 05/10/23  Attempted to reach the patient regarding the most recent Inpatient/ED visit.  Follow Up Plan: No further outreach attempts will be made at this time. We have been unable to contact the patient. Following up with Oncology.  Signature Germain Kohler, CMA (AAMA)  [facility]- [facility] Program [phone]

## 2023-05-11 ENCOUNTER — Ambulatory Visit
Admission: RE | Admit: 2023-05-11 | Discharge: 2023-05-11 | Discharge status: Home / Self Care | Payer: Medicare HMO | Source: Ambulatory Visit | Attending: Radiation Oncology

## 2023-05-11 ENCOUNTER — Inpatient Hospital Stay: Payer: Medicare HMO

## 2023-05-11 ENCOUNTER — Other Ambulatory Visit: Payer: Self-pay

## 2023-05-11 DIAGNOSIS — Z510 Encounter for antineoplastic radiation therapy: Secondary | ICD-10-CM | POA: Diagnosis not present

## 2023-05-11 DIAGNOSIS — Z9221 Personal history of antineoplastic chemotherapy: Secondary | ICD-10-CM | POA: Diagnosis not present

## 2023-05-11 DIAGNOSIS — L03311 Cellulitis of abdominal wall: Secondary | ICD-10-CM | POA: Diagnosis not present

## 2023-05-11 DIAGNOSIS — K429 Umbilical hernia without obstruction or gangrene: Secondary | ICD-10-CM | POA: Diagnosis not present

## 2023-05-11 DIAGNOSIS — C321 Malignant neoplasm of supraglottis: Secondary | ICD-10-CM | POA: Diagnosis not present

## 2023-05-11 DIAGNOSIS — Z923 Personal history of irradiation: Secondary | ICD-10-CM | POA: Diagnosis not present

## 2023-05-11 DIAGNOSIS — R110 Nausea: Secondary | ICD-10-CM | POA: Diagnosis not present

## 2023-05-11 DIAGNOSIS — Z5111 Encounter for antineoplastic chemotherapy: Secondary | ICD-10-CM | POA: Diagnosis not present

## 2023-05-11 DIAGNOSIS — I700 Atherosclerosis of aorta: Secondary | ICD-10-CM | POA: Diagnosis not present

## 2023-05-11 LAB — RAD ONC ARIA SESSION SUMMARY
Course Elapsed Days: 27
Plan Fractions Treated to Date: 20
Plan Prescribed Dose Per Fraction: 2 Gy
Plan Total Fractions Prescribed: 35
Plan Total Prescribed Dose: 70 Gy
Reference Point Dosage Given to Date: 40 Gy
Reference Point Session Dosage Given: 2 Gy
Session Number: 20

## 2023-05-11 NOTE — Telephone Encounter (Signed)
Copied from CRM [id number]. Topic: Clinical - Home Health Verbal Orders >> May 10, 2023 11:36 AM Dimitri Ped wrote: Caller/Agency: pamela/[facility] register nurse Callback Number: [phone] Service Requested: Skilled Nursing Frequency: 1 week 4 2 month 1 2 as needed visit  Any new concerns about the patient? Yes he was discharge with a g2 and he has throat cancer . Follow him for aftercare of the procedure and currently has 2 feedings. Port of cath pac its a device under the skin where they can stick a needle

## 2023-05-12 ENCOUNTER — Other Ambulatory Visit: Payer: Self-pay

## 2023-05-12 ENCOUNTER — Inpatient Hospital Stay: Payer: Medicare HMO

## 2023-05-12 ENCOUNTER — Ambulatory Visit
Admission: RE | Admit: 2023-05-12 | Discharge: 2023-05-12 | Disposition: A | Discharge status: Home / Self Care | Payer: Medicare HMO | Source: Ambulatory Visit | Attending: Radiation Oncology | Admitting: Radiation Oncology

## 2023-05-12 DIAGNOSIS — Z5111 Encounter for antineoplastic chemotherapy: Secondary | ICD-10-CM | POA: Diagnosis not present

## 2023-05-12 DIAGNOSIS — I700 Atherosclerosis of aorta: Secondary | ICD-10-CM | POA: Diagnosis not present

## 2023-05-12 DIAGNOSIS — L03311 Cellulitis of abdominal wall: Secondary | ICD-10-CM | POA: Diagnosis not present

## 2023-05-12 DIAGNOSIS — R1312 Dysphagia, oropharyngeal phase: Secondary | ICD-10-CM | POA: Diagnosis not present

## 2023-05-12 DIAGNOSIS — Z510 Encounter for antineoplastic radiation therapy: Secondary | ICD-10-CM | POA: Diagnosis not present

## 2023-05-12 DIAGNOSIS — R110 Nausea: Secondary | ICD-10-CM | POA: Diagnosis not present

## 2023-05-12 DIAGNOSIS — C321 Malignant neoplasm of supraglottis: Secondary | ICD-10-CM | POA: Diagnosis not present

## 2023-05-12 DIAGNOSIS — Z9221 Personal history of antineoplastic chemotherapy: Secondary | ICD-10-CM | POA: Diagnosis not present

## 2023-05-12 DIAGNOSIS — K429 Umbilical hernia without obstruction or gangrene: Secondary | ICD-10-CM | POA: Diagnosis not present

## 2023-05-12 DIAGNOSIS — Z923 Personal history of irradiation: Secondary | ICD-10-CM | POA: Diagnosis not present

## 2023-05-12 LAB — RAD ONC ARIA SESSION SUMMARY
Course Elapsed Days: 28
Plan Fractions Treated to Date: 21
Plan Prescribed Dose Per Fraction: 2 Gy
Plan Total Fractions Prescribed: 35
Plan Total Prescribed Dose: 70 Gy
Reference Point Dosage Given to Date: 42 Gy
Reference Point Session Dosage Given: 2 Gy
Session Number: 21

## 2023-05-13 ENCOUNTER — Inpatient Hospital Stay: Payer: Medicare HMO

## 2023-05-13 ENCOUNTER — Other Ambulatory Visit: Payer: Self-pay

## 2023-05-13 ENCOUNTER — Ambulatory Visit
Admission: RE | Admit: 2023-05-13 | Discharge: 2023-05-13 | Disposition: A | Discharge status: Home / Self Care | Payer: Medicare HMO | Source: Ambulatory Visit | Attending: Radiation Oncology | Admitting: Radiation Oncology

## 2023-05-13 DIAGNOSIS — Z9221 Personal history of antineoplastic chemotherapy: Secondary | ICD-10-CM | POA: Diagnosis not present

## 2023-05-13 DIAGNOSIS — I700 Atherosclerosis of aorta: Secondary | ICD-10-CM | POA: Diagnosis not present

## 2023-05-13 DIAGNOSIS — C321 Malignant neoplasm of supraglottis: Secondary | ICD-10-CM | POA: Diagnosis not present

## 2023-05-13 DIAGNOSIS — Z5111 Encounter for antineoplastic chemotherapy: Secondary | ICD-10-CM | POA: Diagnosis not present

## 2023-05-13 DIAGNOSIS — R110 Nausea: Secondary | ICD-10-CM | POA: Diagnosis not present

## 2023-05-13 DIAGNOSIS — K429 Umbilical hernia without obstruction or gangrene: Secondary | ICD-10-CM | POA: Diagnosis not present

## 2023-05-13 DIAGNOSIS — Z923 Personal history of irradiation: Secondary | ICD-10-CM | POA: Diagnosis not present

## 2023-05-13 DIAGNOSIS — Z510 Encounter for antineoplastic radiation therapy: Secondary | ICD-10-CM | POA: Diagnosis not present

## 2023-05-13 DIAGNOSIS — L03311 Cellulitis of abdominal wall: Secondary | ICD-10-CM | POA: Diagnosis not present

## 2023-05-13 LAB — RAD ONC ARIA SESSION SUMMARY
Course Elapsed Days: 29
Plan Fractions Treated to Date: 22
Plan Prescribed Dose Per Fraction: 2 Gy
Plan Total Fractions Prescribed: 35
Plan Total Prescribed Dose: 70 Gy
Reference Point Dosage Given to Date: 44 Gy
Reference Point Session Dosage Given: 2 Gy
Session Number: 22

## 2023-05-13 NOTE — Telephone Encounter (Signed)
Copied from CRM [id number]. Topic: General - Other >> May 12, 2023  1:23 PM Whitney O wrote: Reason for CRM: Ms. Rinaldo Cloud from [facility] is calling back concerning a verbal order for patient . Ms. Rinaldo Cloud say they can't do anything until the order has been approved . Ms. Rinaldo Cloud is waiting for the order to be approved so theyy can do what do what needs to be done . I did let her know it was sent to provider . Ms Rinaldo Cloud say she seen patient on the 8th and contacted office on the 10th . Just need to get the orders asap . They keep bugging me for them  [phone] Ms Rinaldo Cloud

## 2023-05-14 ENCOUNTER — Inpatient Hospital Stay: Payer: Medicare HMO

## 2023-05-14 ENCOUNTER — Ambulatory Visit
Admission: RE | Admit: 2023-05-14 | Discharge: 2023-05-14 | Disposition: A | Discharge status: Home / Self Care | Payer: Medicare HMO | Source: Ambulatory Visit | Attending: Radiation Oncology | Admitting: Radiation Oncology

## 2023-05-14 ENCOUNTER — Encounter: Payer: Self-pay | Admitting: Oncology

## 2023-05-14 ENCOUNTER — Other Ambulatory Visit: Payer: Self-pay

## 2023-05-14 ENCOUNTER — Inpatient Hospital Stay (HOSPITAL_BASED_OUTPATIENT_CLINIC_OR_DEPARTMENT_OTHER): Payer: Medicare HMO | Admitting: Oncology

## 2023-05-14 VITALS — BP 134/90 | HR 54 | Temp 97.6°F | Resp 17 | Wt 148.1 lb

## 2023-05-14 DIAGNOSIS — Z510 Encounter for antineoplastic radiation therapy: Secondary | ICD-10-CM | POA: Diagnosis not present

## 2023-05-14 DIAGNOSIS — K429 Umbilical hernia without obstruction or gangrene: Secondary | ICD-10-CM | POA: Diagnosis not present

## 2023-05-14 DIAGNOSIS — L03311 Cellulitis of abdominal wall: Secondary | ICD-10-CM | POA: Diagnosis not present

## 2023-05-14 DIAGNOSIS — Z5111 Encounter for antineoplastic chemotherapy: Secondary | ICD-10-CM

## 2023-05-14 DIAGNOSIS — C321 Malignant neoplasm of supraglottis: Secondary | ICD-10-CM

## 2023-05-14 DIAGNOSIS — R110 Nausea: Secondary | ICD-10-CM | POA: Diagnosis not present

## 2023-05-14 DIAGNOSIS — Z95828 Presence of other vascular implants and grafts: Secondary | ICD-10-CM

## 2023-05-14 DIAGNOSIS — Z9221 Personal history of antineoplastic chemotherapy: Secondary | ICD-10-CM | POA: Diagnosis not present

## 2023-05-14 DIAGNOSIS — I700 Atherosclerosis of aorta: Secondary | ICD-10-CM | POA: Diagnosis not present

## 2023-05-14 DIAGNOSIS — Z923 Personal history of irradiation: Secondary | ICD-10-CM | POA: Diagnosis not present

## 2023-05-14 LAB — BASIC METABOLIC PANEL - CANCER CENTER ONLY
Anion gap: 5 (ref 5–15)
BUN: 15 mg/dL (ref 6–20)
CO2: 33 mmol/L — ABNORMAL HIGH (ref 22–32)
Calcium: 9.4 mg/dL (ref 8.9–10.3)
Chloride: 101 mmol/L (ref 98–111)
Creatinine: 0.9 mg/dL (ref 0.61–1.24)
GFR, Estimated: >60 mL/min (ref >60)
Glucose, Bld: 84 mg/dL (ref 70–99)
Potassium: 3.8 mmol/L (ref 3.5–5.1)
Sodium: 139 mmol/L (ref 135–145)

## 2023-05-14 LAB — CBC WITH DIFFERENTIAL (CANCER CENTER ONLY)
Abs Immature Granulocytes: 0.01 10*3/uL (ref 0.00–0.07)
Basophils Absolute: 0 10*3/uL (ref 0.0–0.1)
Basophils Relative: 0 %
Eosinophils Absolute: 0 10*3/uL (ref 0.0–0.5)
Eosinophils Relative: 0 %
HCT: 37.6 % — ABNORMAL LOW (ref 39.0–52.0)
Hemoglobin: 12.5 g/dL — ABNORMAL LOW (ref 13.0–17.0)
Immature Granulocytes: 0 %
Lymphocytes Relative: 19 %
Lymphs Abs: 0.7 10*3/uL (ref 0.7–4.0)
MCH: 29.4 pg (ref 26.0–34.0)
MCHC: 33.2 g/dL (ref 30.0–36.0)
MCV: 88.5 fL (ref 80.0–100.0)
Monocytes Absolute: 0.6 10*3/uL (ref 0.1–1.0)
Monocytes Relative: 18 %
Neutro Abs: 2.3 10*3/uL (ref 1.7–7.7)
Neutrophils Relative %: 63 %
Platelet Count: 291 10*3/uL (ref 150–400)
RBC: 4.25 MIL/uL (ref 4.22–5.81)
RDW: 12.7 % (ref 11.5–15.5)
WBC Count: 3.6 10*3/uL — ABNORMAL LOW (ref 4.0–10.5)
nRBC: 0 % (ref 0.0–0.2)

## 2023-05-14 LAB — RAD ONC ARIA SESSION SUMMARY
Course Elapsed Days: 30
Plan Fractions Treated to Date: 23
Plan Prescribed Dose Per Fraction: 2 Gy
Plan Total Fractions Prescribed: 35
Plan Total Prescribed Dose: 70 Gy
Reference Point Dosage Given to Date: 46 Gy
Reference Point Session Dosage Given: 2 Gy
Session Number: 23

## 2023-05-14 LAB — MAGNESIUM: Magnesium: 1.8 mg/dL (ref 1.7–2.4)

## 2023-05-14 MED ORDER — HEPARIN SOD (PORK) LOCK FLUSH 100 UNIT/ML IV SOLN
500.0000 [IU] | Freq: Once | INTRAVENOUS | Status: AC
  Administered 2023-05-14: 500 [IU]

## 2023-05-14 MED ORDER — SODIUM CHLORIDE 0.9% FLUSH
10.0000 mL | Freq: Once | INTRAVENOUS | Status: AC
  Administered 2023-05-14: 10 mL

## 2023-05-14 MED FILL — Fosaprepitant Dimeglumine For IV Infusion 150 MG (Base Eq): INTRAVENOUS | Qty: 5 | Status: AC

## 2023-05-14 NOTE — Assessment & Plan Note (Signed)
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   Discussed his case in our tumor conference on 04/14/2023.  Based on imaging studies, it is classified as cT3 lesion.  cN1.  Hence plan is to proceed with concurrent chemoradiation.  We have discussed about role of cisplatin being a radiosensitizer in the treatment of head and neck cancer.  We have discussed about the curative intent of chemoradiation for this patient.     -He began radiation treatments from 04/14/2023.  Began chemotherapy with cisplatin from 04/19/2023.  He has been tolerating chemoradiation well without any noticeable side effects.  Recently he was hospitalized from 05/03/2023 until 05/07/2023 for cellulitis around the site of he G-tube insertion.  He was treated with IV antibiotics initially and currently is on oral antibiotics, scheduled to complete course in 2 to 3 days.  We held chemotherapy that week.  -Labs today reveal no dose-limiting toxicities.  Will proceed with cisplatin weekly during the course of radiation.  He is scheduled for cycle 5 on 05/17/2023.  Cisplatin will be given at a dose of 40 mg/m weekly.   -He has prescriptions available for nausea medicines.  Emphasized adequate hydration.  Patient verbalized understanding.  Will continue to monitor blood counts with each treatment.  Will consider dose reducing cisplatin to 30 mg/m if progressive leukopenia or neutropenia is noted.  We could also consider starting him on Neupogen as needed.  RTC in 1 week for labs, follow-up and continuation of chemotherapy.

## 2023-05-14 NOTE — Progress Notes (Signed)
[facility]  ONCOLOGY CLINIC PROGRESS NOTE   Patient Care Team: [name], MD as PCP - General (Internal Medicine) West Bali, MD (Inactive) as Consulting Physician (Gastroenterology) Lonie Peak, MD as Attending Physician (Radiation Oncology) Malmfelt, Lise Auer, RN as Oncology Nurse Navigator Meryl Crutch, MD as Consulting Physician (Oncology) Karle Barr, MD as Referring Physician (Otolaryngology)  PATIENT NAME: [name]   MR#: [id number] DOB: 27-Jul-1970  Date of visit: 05/14/2023   ASSESSMENT & PLAN:   [name] is a 53 y.o. 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   Malignant neoplasm of supraglottis (HCC) Supraglottic cancer with one suspicious 7 mm lymph node on PET scan, classified as T3, N1, M0, stage III, low volume disease.   Discussed his case in our tumor conference on 04/14/2023.  Based on imaging studies, it is classified as cT3 lesion.  cN1.  Hence plan is to proceed with concurrent chemoradiation.  We have discussed about role of cisplatin being a radiosensitizer in the treatment of head and neck cancer.  We have discussed about the curative intent of chemoradiation for this patient.     -He began radiation treatments from 04/14/2023.  Began chemotherapy with cisplatin from 04/19/2023.  He has been tolerating chemoradiation well without any noticeable side effects.  Recently he was hospitalized from 05/03/2023 until 05/07/2023 for cellulitis around the site of he G-tube insertion.  He was treated with IV antibiotics initially and currently is on oral antibiotics, scheduled to complete course in 2 to 3 days.  We held chemotherapy that week.  -Labs today reveal no dose-limiting toxicities.  Will proceed with cisplatin weekly during the course of radiation.  He is  scheduled for cycle 5 on 05/17/2023.  Cisplatin will be given at a dose of 40 mg/m weekly.   -He has prescriptions available for nausea medicines.  Emphasized adequate hydration.  Patient verbalized understanding.  Will continue to monitor blood counts with each treatment.  Will consider dose reducing cisplatin to 30 mg/m if progressive leukopenia or neutropenia is noted.  We could also consider starting him on Neupogen as needed.  RTC in 1 week for labs, follow-up and continuation of chemotherapy.   Cellulitis of abdominal wall This has resolved now.  He is scheduled to complete antibiotic course in the next day or 2.     I reviewed lab results and outside records for this visit and discussed relevant results with the patient. Diagnosis, plan of care and treatment options were also discussed in detail with the patient. Opportunity provided to ask questions and answers provided to his apparent satisfaction. Provided instructions to call our clinic with any problems, questions or concerns prior to return visit. I recommended to continue follow-up with PCP and sub-specialists. He verbalized understanding and agreed with the plan.   NCCN guidelines have been consulted in the planning of this patient's care.  I spent a total of 30 minutes during this encounter with the patient including review of chart and various tests results, discussions about plan of care and coordination of care plan.   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   Current Treatment: Concurrent chemoradiation with weekly cisplatin.  Radiation started from 04/14/2023.  Cisplatin  started from 04/19/2023.  INTERVAL HISTORY:    Discussed the use of AI scribe software for clinical note transcription with the patient, who gave verbal consent to proceed.   [name] is here today for repeat clinical assessment.   Recently he was hospitalized from 05/03/2023 until 05/07/2023 for cellulitis around the site of he G-tube insertion.  He was treated with IV antibiotics initially and currently is on oral antibiotics, scheduled to complete course in 2 to 3 days.  We held chemotherapy that week.  Reports that his stomach is 'doing a lot better' and he has no more pain. He is almost done with his antibiotics, with only 'two or three more' left. He denies fevers, chills, and night sweats.  He had chemotherapy on Monday and his blood counts look overall okay, although his white count is slightly low at three thousand six hundred. He has been eating okay, but he is 'struggling a little bit' with it. He has been in contact with a nutritionist who suggested a vitamin supplement. He denies fevers, chills, nausea, and vomiting. He reports a little ringing in the ear, but it's 'not as bad as it used to be.' His voice is 'definitely stronger.'  He also has a port for his chemotherapy, which looks good and is not red. He has been doing mouthwashes.  I have reviewed the past medical history, past surgical history, social history and family history with the patient and they are unchanged from previous note.  HISTORY OF PRESENT ILLNESS:   ONCOLOGY HISTORY:   He initially presented to the [facility] on 10/28/22 with c/o left ear pain, popping with chewing, and pain radiating down the side of his neck and throat x 1 month. He was treated for a left ear infection with a course of prednisone.   His symptoms including left ear and throat pain however persisted and progressed and he was subsequently referred to Dr. Suszanne Conners on 03/10/23 at [facility] for further evaluation. During which time, the  patient detailed that his pain first began in the ear and eventually progressed to involve the left side of his throat.  A laryngoscopy was subsequently performed at that time which revealed an ulcerative lesion on the posterior aspect of the epiglottis, superior to the vocal cords. The arytenoid mucosa was also mildly edematous and the true vocal folds were pale yellow, mobile, and without mass or lesion.       He accordingly underwent a direct laryngoscopy with biopsies of the left supraglottic tumor/mass on 03/16/23. Pathology showed findings consistent with invasive and in situ moderately differentiated squamous cell carcinoma. Per note by Dr Suszanne Conners, "Examination of the vallecula, piriform sinuses, and the pharyngeal mucosa were all normal. A fungating mass was noted on the posterior aspect of the left epiglottis. The mass did not appear to involve the vocal cords."   Soft tissue neck CT and PET scan on 03/23/23 were done - demonstrating a left supraglottic lesion which may involve the glottis as well as a 7 mm left level 3 lymph node that is suspicious for metastatic involvement.  No distant metastatic disease.  cT3,cN1,cM0, Stage III disease.   Plan made to proceed with concurrent chemoradiation with weekly cisplatin.  Oncology History  Malignant neoplasm  of supraglottis (HCC)  03/22/2023 Initial Diagnosis   Malignant neoplasm of supraglottis (HCC)   03/26/2023 Cancer Staging   Staging form: Larynx - Supraglottis, AJCC 8th Edition - Clinical stage from 03/26/2023: Stage III (cT3, cN1, cM0) - Signed by Lonie Peak, MD on 04/14/2023 Stage prefix: Initial diagnosis   04/19/2023 -  Chemotherapy   Patient is on Treatment Plan : HEAD/NECK Cisplatin (40) q7d         REVIEW OF SYSTEMS:   Review of Systems - Oncology  All other pertinent systems were reviewed with the patient and are negative.  ALLERGIES: He has no known allergies.  MEDICATIONS:  Current Outpatient Medications   Medication Sig Dispense Refill   acetaminophen (TYLENOL) 500 MG tablet Take 1,000 mg by mouth every 6 (six) hours as needed for mild pain (pain score 1-3) or moderate pain (pain score 4-6).     amlodipine-olmesartan (AZOR) 10-20 MG tablet TAKE 1 TABLET BY MOUTH DAILY 30 tablet 1   dexamethasone (DECADRON) 4 MG tablet Take 2 tablets (8 mg) by mouth daily x 3 days starting the day after cisplatin chemotherapy. Take with food. 30 tablet 1   famotidine (PEPCID) 40 MG tablet Take 1 tablet (40 mg total) by mouth daily. 30 tablet 3   FLUoxetine (PROZAC) 20 MG capsule Take 1 capsule (20 mg total) by mouth daily. 30 capsule 3   fluticasone (FLONASE) 50 MCG/ACT nasal spray Place 1 spray into both nostrils 2 (two) times daily. 16 g 2   hydrOXYzine (ATARAX) 25 MG tablet Take 1 tablet (25 mg total) by mouth 3 (three) times daily as needed for anxiety. 90 tablet 1   lidocaine (XYLOCAINE) 2 % solution Patient: Mix 1part 2% viscous lidocaine, 1part H20. Swallow 10mL of diluted mixture, before meals and at bedtime, up to QID 200 mL 3   lidocaine-prilocaine (EMLA) cream Apply to affected area once 30 g 3   metoprolol succinate (TOPROL-XL) 25 MG 24 hr tablet TAKE 1 TABLET(25 MG) BY MOUTH DAILY 30 tablet 1   nicotine (NICODERM CQ - DOSED IN MG/24 HOURS) 14 mg/24hr patch Place 1 patch (14 mg total) onto the skin daily. Apply 21 mg patch daily x 6 wk, then 14mg  patch daily x 2 wk, then 7 mg patch daily x 2 wk 14 patch 0   nicotine (NICODERM CQ - DOSED IN MG/24 HOURS) 21 mg/24hr patch Place 1 patch (21 mg total) onto the skin daily. Apply 21 mg patch daily x 6 wk, then 14mg  patch daily x 2 wk, then 7 mg patch daily x 2 wk 14 patch 2   nicotine (NICODERM CQ - DOSED IN MG/24 HR) 7 mg/24hr patch Place 1 patch (7 mg total) onto the skin daily. Apply 21 mg patch daily x 6 wk, then 14mg  patch daily x 2 wk, then 7 mg patch daily x 2 wk 14 patch 0   Nutritional Supplements (FEEDING SUPPLEMENT, OSMOLITE 1.5 CAL,) LIQD Place  60 mL/hr into feeding tube continuous. 7 Cartons of Osmolite 1.5 per day + an additional free water from flushes For long-term 1000 mL 0   ondansetron (ZOFRAN) 8 MG tablet Take 1 tablet (8 mg total) by mouth every 8 (eight) hours as needed for nausea or vomiting. Start on the third day after cisplatin. 30 tablet 1   prochlorperazine (COMPAZINE) 10 MG tablet TAKE 1 TABLET(10 MG) BY MOUTH EVERY 6 HOURS AS NEEDED FOR NAUSEA OR VOMITING 30 tablet 1   Protein (FEEDING SUPPLEMENT, PROSOURCE TF20,) liquid  Place 60 mLs into feeding tube daily. 1800 mL 0   QUEtiapine (SEROQUEL) 200 MG tablet Take 1 tablet (200 mg total) by mouth at bedtime. 30 tablet 2   QUEtiapine (SEROQUEL) 50 MG tablet Take one pill nightly for 1 week. Then increase to 2 pills nightly for 1 week. Then increase to 3 pills nightly for one week. Then increase to 4 pills nightly for one week. 90 tablet 0   scopolamine (TRANSDERM-SCOP) 1 MG/3DAYS Place 1 patch (1.5 mg total) onto the skin every 3 (three) days. 10 patch 2   No current facility-administered medications for this visit.     VITALS:   Blood pressure (!) 134/90, pulse (!) 54, temperature 97.6 F (36.4 C), temperature source Temporal, resp. rate 17, weight 148 lb 2 oz (67.2 kg), SpO2 100%.  Wt Readings from Last 3 Encounters:  05/14/23 148 lb 2 oz (67.2 kg)  05/07/23 143 lb 15.4 oz (65.3 kg)  05/03/23 146 lb 4 oz (66.3 kg)    Body mass index is 25.43 kg/m.   Onc Performance Status - 05/14/23 1151       ECOG Perf Status   ECOG Perf Status Fully active, able to carry on all pre-disease performance without restriction      KPS SCALE   KPS % SCORE Able to carry on normal activity, minor s/s of disease             PHYSICAL EXAM:   Physical Exam Constitutional:      General: He is not in acute distress.    Appearance: Normal appearance.  HENT:     Head: Normocephalic and atraumatic.     Mouth/Throat:     Comments: Mild erythema in the back of the  throat. Eyes:     General: No scleral icterus.    Conjunctiva/sclera: Conjunctivae normal.  Cardiovascular:     Rate and Rhythm: Normal rate and regular rhythm.     Heart sounds: Normal heart sounds.  Pulmonary:     Effort: Pulmonary effort is normal.     Breath sounds: Normal breath sounds.  Chest:     Comments: Port-A-Cath in place without any signs of infection Abdominal:     General: There is no distension.     Comments: Feeding tube in place, bandaged.  Cellulitis has resolved.  Musculoskeletal:     Right lower leg: No edema.     Left lower leg: No edema.  Neurological:     General: No focal deficit present.     Mental Status: He is alert and oriented to person, place, and time.  Psychiatric:        Mood and Affect: Mood normal.        Behavior: Behavior normal.        Thought Content: Thought content normal.       LABORATORY DATA:   I have reviewed the data as listed.  Results for orders placed or performed in visit on 05/14/23  Magnesium  Result Value Ref Range   Magnesium 1.8 1.7 - 2.4 mg/dL  Basic Metabolic Panel - Cancer Center Only  Result Value Ref Range   Sodium 139 135 - 145 mmol/L   Potassium 3.8 3.5 - 5.1 mmol/L   Chloride 101 98 - 111 mmol/L   CO2 33 (H) 22 - 32 mmol/L   Glucose, Bld 84 70 - 99 mg/dL   BUN 15 6 - 20 mg/dL   Creatinine 1.61 0.96 - 1.24 mg/dL   Calcium 9.4 8.9 -  10.3 mg/dL   GFR, Estimated >45 >40 mL/min   Anion gap 5 5 - 15  CBC with Differential (Cancer Center Only)  Result Value Ref Range   WBC Count 3.6 (L) 4.0 - 10.5 K/uL   RBC 4.25 4.22 - 5.81 MIL/uL   Hemoglobin 12.5 (L) 13.0 - 17.0 g/dL   HCT 98.1 (L) 19.1 - 47.8 %   MCV 88.5 80.0 - 100.0 fL   MCH 29.4 26.0 - 34.0 pg   MCHC 33.2 30.0 - 36.0 g/dL   RDW 29.5 62.1 - 30.8 %   Platelet Count 291 150 - 400 K/uL   nRBC 0.0 0.0 - 0.2 %   Neutrophils Relative % 63 %   Neutro Abs 2.3 1.7 - 7.7 K/uL   Lymphocytes Relative 19 %   Lymphs Abs 0.7 0.7 - 4.0 K/uL   Monocytes  Relative 18 %   Monocytes Absolute 0.6 0.1 - 1.0 K/uL   Eosinophils Relative 0 %   Eosinophils Absolute 0.0 0.0 - 0.5 K/uL   Basophils Relative 0 %   Basophils Absolute 0.0 0.0 - 0.1 K/uL   Immature Granulocytes 0 %   Abs Immature Granulocytes 0.01 0.00 - 0.07 K/uL  Results for orders placed or performed in visit on 05/14/23  Rad Onc Aria Session Summary  Result Value Ref Range   Course ID [id number]    Course Start Date 04/05/2023    Session Number 23    Course First Treatment Date 04/14/2023  9:27 AM    Course Last Treatment Date 05/14/2023  9:15 AM    Course Elapsed Days 30    Reference Point ID HN    Reference Point Dosage Given to Date 46 Gy   Reference Point Session Dosage Given 2 Gy   Plan ID HN    Plan Fractions Treated to Date 23    Plan Total Fractions Prescribed 35    Plan Prescribed Dose Per Fraction 2 Gy   Plan Total Prescribed Dose 70.000000 Gy   Plan Primary Reference Point HN      RADIOGRAPHIC STUDIES:  I have personally reviewed the radiological images as listed and agree with the findings in the report.  CT ABDOMEN PELVIS W CONTRAST Result Date: 05/03/2023 CLINICAL DATA:  Peritonitis or perforation suspected. Abdominal pain. Nausea. Gastrostomy tube insertion. EXAM: CT ABDOMEN AND PELVIS WITH CONTRAST TECHNIQUE: Multidetector CT imaging of the abdomen and pelvis was performed using the standard protocol following bolus administration of intravenous contrast. RADIATION DOSE REDUCTION: This exam was performed according to the departmental dose-optimization program which includes automated exposure control, adjustment of the mA and/or kV according to patient size and/or use of iterative reconstruction technique. CONTRAST:  OMNIPAQUE IOHEXOL 300 MG/ML  SOLN COMPARISON:  PET-CT 03/23/2023. FINDINGS: Lower chest: No acute abnormality. Hepatobiliary: No focal liver abnormality is seen. No gallstones, gallbladder wall thickening, or biliary dilatation. Pancreas: Unremarkable.  No pancreatic ductal dilatation or surrounding inflammatory changes. Spleen: Normal in size without focal abnormality. Adrenals/Urinary Tract: Subcentimeter rounded hypodensities in the left kidney are too small to characterize, likely cysts. Otherwise, the adrenal glands, kidneys and bladder are within normal limits. Stomach/Bowel: There is a new percutaneous gastrostomy tube in the body of the stomach. There is mild stranding surrounding the stomach at the level of the gastrostomy tube without fluid collection or free air. The stomach otherwise appears within normal limits. Appendix is not seen. No evidence of other bowel wall thickening, distention, or inflammatory changes. Vascular/Lymphatic: Aortic atherosclerosis. No enlarged abdominal  or pelvic lymph nodes. Reproductive: Prostate is unremarkable. Other: There is a small fat containing umbilical hernia. There is no free fluid or free air. There some subcutaneous and intramuscular edema/stranding at the level of the gastrostomy. No drainable fluid collections are seen. No soft tissue gas. Musculoskeletal: No acute or significant osseous findings. IMPRESSION: 1. New percutaneous gastrostomy tube in the body of the stomach. There is mild stranding surrounding the stomach at the level of the gastrostomy tube without fluid collection or free air. 2. Subcutaneous and intramuscular edema/stranding at the level of the gastrostomy tube. No drainable fluid collections. 3. No other acute localizing process in the abdomen or pelvis. Aortic Atherosclerosis (ICD10-I70.0). Electronically Signed   By: Darliss Cheney M.D.   On: 05/03/2023 17:35   DG Chest 2 View Result Date: 05/03/2023 CLINICAL DATA:  Productive cough.  Laryngeal carcinoma. EXAM: CHEST - 2 VIEW COMPARISON:  None Available. FINDINGS: The heart size and mediastinal contours are within normal limits. Right-sided Port-A-Cath is seen in appropriate position. Both lungs are clear. The visualized skeletal structures  are unremarkable. IMPRESSION: No active cardiopulmonary disease. Electronically Signed   By: Danae Orleans M.D.   On: 05/03/2023 15:36   IR PATIENT EVAL TECH 0-60 MINS Result Date: 05/03/2023 Barrett Shell, RT     05/03/2023  2:21 PM Patient presented to IR today with complaints of redness, tenderness, and swelling around newly placed gastrostomy tube site.  Gastrostomy tube bumper appeared to be tight to skin, likely due to swelling.  After bumper was loosened, pus came out from around the tube site. At this time, S. Rae Roam, [place] consulted D. Deanne Coffer, MD, who advised patient to go to Emergency Department to be treated for infection.   IR GASTROSTOMY TUBE MOD SED Result Date: 04/28/2023 CLINICAL DATA:  Laryngeal carcinoma, needs gastrostomy for enteral feeding support EXAM: PERC PLACEMENT GASTROSTOMY FLUOROSCOPY: Radiation Exposure Index (as provided by the fluoroscopic device): 13 mGy air Kerma TECHNIQUE: The procedure, risks, benefits, and alternatives were explained to the patient. Questions regarding the procedure were encouraged and answered. The patient understands and consents to the procedure. As antibiotic prophylaxis, cefazolin 2 g was ordered pre-procedure and administered intravenously within one hour of incision. Progression of previously administered oral barium into the colon was confirmed fluoroscopically. A 5 French angiographic catheter was placed as orogastric tube. The upper abdomen was prepped with Betadine, draped in usual sterile fashion, and infiltrated locally with 1% lidocaine. Intravenous Fentanyl and Versed 1mg  were administered by RN during a total moderate (conscious) sedation time of 10 minutes; the patient's level of consciousness and physiological / cardiorespiratory status were monitored continuously by radiology RN under my direct supervision. 0.5 mg glucagon given IV to facilitate gastric distention.Stomach was insufflated using air through the orogastric tube. An 72  French sheath needle was advanced percutaneously into the gastric lumen under fluoroscopy. Gas could be aspirated and a small contrast injection confirmed intraluminal spread. The sheath was exchanged over a guidewire for a 9 [place] vascular sheath, through which the snare device was advanced and used to snare a guidewire passed through the orogastric tube. This was withdrawn, and the snare attached to the 20 French pull-through gastrostomy tube, which was advanced antegrade, positioned with the internal bumper securing the anterior gastric wall to the anterior abdominal wall. Small contrast injection confirms appropriate positioning. The external bumper was applied and the catheter was flushed. COMPLICATIONS: COMPLICATIONS none IMPRESSION: 1. Technically successful 20 French pull-through gastrostomy placement under fluoroscopy. Electronically Signed  By: Corlis Leak M.D.   On: 04/28/2023 15:03   IR IMAGING GUIDED PORT INSERTION Result Date: 04/28/2023 CLINICAL DATA:  Laryngeal carcinoma, needs durable venous access for planned treatment regimen EXAM: TUNNELED PORT CATHETER PLACEMENT WITH ULTRASOUND AND FLUOROSCOPIC GUIDANCE FLUOROSCOPY: Radiation Exposure Index (as provided by the fluoroscopic device): Less than 0.1 mGy air Kerma ANESTHESIA/SEDATION: Intravenous Fentanyl and Versed 2mg  were administered by RN during a total moderate (conscious) sedation time of 10 minutes; the patient's level of consciousness and physiological / cardiorespiratory status were monitored continuously by radiology RN under my direct supervision. TECHNIQUE: The procedure, risks, benefits, and alternatives were explained to the patient. Questions regarding the procedure were encouraged and answered. The patient understands and consents to the procedure. Patency of the right IJ vein was confirmed with ultrasound with image documentation. An appropriate skin site was determined. Skin site was marked. Region was prepped using  maximum barrier technique including cap and mask, sterile gown, sterile gloves, large sterile sheet, and Chlorhexidine as cutaneous antisepsis. The region was infiltrated locally with 1% lidocaine. Under real-time ultrasound guidance, the right IJ vein was accessed with a 21 gauge micropuncture needle; the needle tip within the vein was confirmed with ultrasound image documentation. Needle was exchanged over a 018 guidewire for transitional dilator, and vascular measurement was performed. A small incision was made on the right anterior chest wall and a subcutaneous pocket fashioned. The power-injectable port was positioned and its catheter tunneled to the right IJ dermatotomy site. The transitional dilator was exchanged over an Amplatz wire for a peel-away sheath, through which the port catheter, which had been trimmed to the appropriate length, was advanced and positioned under fluoroscopy with its tip at the cavoatrial junction. Spot chest radiograph confirms good catheter position and no pneumothorax. The port was flushed per protocol. The pocket was closed with deep interrupted and subcuticular continuous 3-0 Monocryl sutures. The incisions were covered with Dermabond then covered with a sterile dressing. The patient tolerated the procedure well. COMPLICATIONS: COMPLICATIONS None immediate IMPRESSION: Technically successful right IJ power-injectable port catheter placement. Ready for routine use. Electronically Signed   By: Corlis Leak M.D.   On: 04/28/2023 15:01     CODE STATUS:  Code Status History     Date Active Date Inactive Code Status Order ID Comments User Context   06/23/2017 1540 06/28/2017 2102 Full Code [id number]  Laveda Abbe, NP Inpatient   06/23/2017 0327 06/23/2017 1502 Full Code [id number]  [facility]   01/13/2017 0858 01/13/2017 1329 Full Code [id number]  Lucretia Roers, MD Inpatient   11/05/2016 0018 11/10/2016 1937 Full Code [id number]  Kerry Hough, PA-C  Inpatient       No orders of the defined types were placed in this encounter.    Future Appointments  Date Time Provider Department Center  05/17/2023  9:35 AM [facility] [id number] [facility] None  05/17/2023  9:45 AM [facility] None  05/17/2023 10:00 AM [facility] INFUSION [facility] None  05/17/2023 11:15 AM Alphonse Guild, RD [facility] None  05/18/2023  9:30 AM [facility] [id number] [facility] None  05/19/2023  9:30 AM [facility] [id number] [facility] None  05/20/2023  9:25 AM [facility] [id number] [facility] None  05/21/2023  9:30 AM [facility] [id number] [facility] None  05/24/2023  8:00 AM [facility] FLUSH [facility] None  05/24/2023  8:30 AM Pollyann Samples, NP [facility] None  05/24/2023  9:30 AM [facility] [id number] [facility] None  05/24/2023 10:00 AM [facility] INFUSION [facility] None  05/24/2023 12:00 PM  Alphonse Guild, RD [facility] None  05/25/2023  9:30 AM [facility] [id number] [facility] None  05/26/2023  9:30 AM [facility] [id number] [facility] None  05/27/2023  9:30 AM [facility] [id number] [facility] None  05/28/2023  9:30 AM [facility] [id number] [facility] None  05/28/2023  2:15 PM [facility] FLUSH [facility] None  05/28/2023  3:00 PM Sharice Harriss, MD [facility] None  05/31/2023  9:30 AM [facility] [id number] [facility] None  05/31/2023 10:00 AM [facility] INFUSION [facility] None  05/31/2023  3:15 PM Alphonse Guild, RD [facility] None  06/01/2023  9:30 AM [facility] [id number] [facility] None  08/04/2023  1:40 PM [name], MD [facility]      This document was completed utilizing speech recognition software. Grammatical errors, random word insertions, pronoun errors, and incomplete sentences are an occasional consequence of this system due to software limitations, ambient noise, and hardware issues. Any formal questions or concerns about the content, text or information contained within the body of this dictation should be directly addressed  to the provider for clarification.

## 2023-05-14 NOTE — Assessment & Plan Note (Signed)
This has resolved now.  He is scheduled to complete antibiotic course in the next day or 2.

## 2023-05-17 ENCOUNTER — Inpatient Hospital Stay: Payer: Medicare HMO

## 2023-05-17 ENCOUNTER — Ambulatory Visit
Admission: RE | Admit: 2023-05-17 | Discharge: 2023-05-17 | Disposition: A | Discharge status: Home / Self Care | Payer: Medicare HMO | Source: Ambulatory Visit | Attending: Radiation Oncology

## 2023-05-17 ENCOUNTER — Other Ambulatory Visit: Payer: Self-pay

## 2023-05-17 ENCOUNTER — Encounter: Payer: Self-pay | Admitting: Oncology

## 2023-05-17 VITALS — BP 117/78 | HR 57 | Temp 98.4°F | Resp 20 | Wt 146.0 lb

## 2023-05-17 DIAGNOSIS — C321 Malignant neoplasm of supraglottis: Secondary | ICD-10-CM

## 2023-05-17 DIAGNOSIS — I700 Atherosclerosis of aorta: Secondary | ICD-10-CM | POA: Diagnosis not present

## 2023-05-17 DIAGNOSIS — Z510 Encounter for antineoplastic radiation therapy: Secondary | ICD-10-CM | POA: Diagnosis not present

## 2023-05-17 DIAGNOSIS — Z9221 Personal history of antineoplastic chemotherapy: Secondary | ICD-10-CM | POA: Diagnosis not present

## 2023-05-17 DIAGNOSIS — L03311 Cellulitis of abdominal wall: Secondary | ICD-10-CM | POA: Diagnosis not present

## 2023-05-17 DIAGNOSIS — Z5111 Encounter for antineoplastic chemotherapy: Secondary | ICD-10-CM | POA: Diagnosis not present

## 2023-05-17 DIAGNOSIS — K429 Umbilical hernia without obstruction or gangrene: Secondary | ICD-10-CM | POA: Diagnosis not present

## 2023-05-17 DIAGNOSIS — Z923 Personal history of irradiation: Secondary | ICD-10-CM | POA: Diagnosis not present

## 2023-05-17 DIAGNOSIS — R110 Nausea: Secondary | ICD-10-CM | POA: Diagnosis not present

## 2023-05-17 LAB — RAD ONC ARIA SESSION SUMMARY
Course Elapsed Days: 33
Plan Fractions Treated to Date: 24
Plan Prescribed Dose Per Fraction: 2 Gy
Plan Total Fractions Prescribed: 35
Plan Total Prescribed Dose: 70 Gy
Reference Point Dosage Given to Date: 48 Gy
Reference Point Session Dosage Given: 2 Gy
Session Number: 24

## 2023-05-17 MED ORDER — SODIUM CHLORIDE 0.9 % IV SOLN
150.0000 mg | Freq: Once | INTRAVENOUS | Status: AC
  Administered 2023-05-17: 150 mg via INTRAVENOUS
  Filled 2023-05-17: qty 150

## 2023-05-17 MED ORDER — SODIUM CHLORIDE 0.9 % IV SOLN: INTRAVENOUS | Status: DC

## 2023-05-17 MED ORDER — MAGNESIUM SULFATE 2 GM/50ML IV SOLN
2.0000 g | Freq: Once | INTRAVENOUS | Status: AC
  Administered 2023-05-17: 2 g via INTRAVENOUS
  Filled 2023-05-17: qty 50

## 2023-05-17 MED ORDER — POTASSIUM CHLORIDE IN NACL 20-0.9 MEQ/L-% IV SOLN
Freq: Once | INTRAVENOUS | Status: AC
  Filled 2023-05-17: qty 1000

## 2023-05-17 MED ORDER — HEPARIN SOD (PORK) LOCK FLUSH 100 UNIT/ML IV SOLN
500.0000 [IU] | Freq: Once | INTRAVENOUS | Status: AC | PRN
  Administered 2023-05-17: 500 [IU]

## 2023-05-17 MED ORDER — DEXAMETHASONE SODIUM PHOSPHATE 10 MG/ML IJ SOLN
10.0000 mg | Freq: Once | INTRAMUSCULAR | Status: AC
  Administered 2023-05-17: 10 mg via INTRAVENOUS
  Filled 2023-05-17: qty 1

## 2023-05-17 MED ORDER — SODIUM CHLORIDE 0.9% FLUSH
10.0000 mL | INTRAVENOUS | Status: DC | PRN
  Administered 2023-05-17: 10 mL

## 2023-05-17 MED ORDER — PALONOSETRON HCL INJECTION 0.25 MG/5ML
0.2500 mg | Freq: Once | INTRAVENOUS | Status: AC
  Administered 2023-05-17: 0.25 mg via INTRAVENOUS
  Filled 2023-05-17: qty 5

## 2023-05-17 MED ORDER — SODIUM CHLORIDE 0.9 % IV SOLN
40.0000 mg/m2 | Freq: Once | INTRAVENOUS | Status: AC
  Administered 2023-05-17: 70 mg via INTRAVENOUS
  Filled 2023-05-17: qty 70

## 2023-05-17 NOTE — Patient Instructions (Signed)

## 2023-05-17 NOTE — Progress Notes (Signed)
 Nutrition Follow-up:  Patient with SCC of supraglottis, stage III.  Patient receiving concurrent chemotherapy and radiation.   Met with patient today during infusion.  Patient has been giving taking 2 cartons of formula and 16 oz bottle water at early morning feeding (7-8am).  Denies feeling full, bloating or intolerance.  Later during the day will give 1 carton of formula with 16oz bottle of water around 3pm.  Then says he can't do anymore feedings due to not able to get the formula in (feels full).  Has been eating orally as well (soups, yogurt, jello, easy to swallow foods.      Medications: reviewed  Labs: reviewed  Anthropometrics:   Weight 148 lb 2 oz today  143 lb on 2/7 148 lb on `1/31 151 lb on 1/6   Estimated Energy Needs  Kcals: 2100-2450 Protein: 105-122 g Fluid: > 2.1 L  NUTRITION DIAGNOSIS: Predicted sub optimal energy intake continues   INTERVENTION:  Asked patient to not eat or drink liquids 1-2 hours before feeding. Add feeding at 8pm (1 carton with 60 ml water flush before and after).  Keep feeding at 7am and 3pm.       MONITORING, EVALUATION, GOAL: weight trends, intake, tube feeding   NEXT VISIT: Monday, Feb 24 during infusion  Dollie Bressi B. Freida Busman, RD, LDN Registered Dietitian [phone]

## 2023-05-18 ENCOUNTER — Ambulatory Visit
Admission: RE | Admit: 2023-05-18 | Discharge: 2023-05-18 | Disposition: A | Discharge status: Home / Self Care | Payer: Medicare HMO | Source: Ambulatory Visit | Attending: Radiation Oncology

## 2023-05-18 ENCOUNTER — Other Ambulatory Visit: Payer: Self-pay

## 2023-05-18 DIAGNOSIS — J189 Pneumonia, unspecified organism: Secondary | ICD-10-CM | POA: Diagnosis present

## 2023-05-18 DIAGNOSIS — Z1152 Encounter for screening for COVID-19: Secondary | ICD-10-CM | POA: Diagnosis not present

## 2023-05-18 DIAGNOSIS — R55 Syncope and collapse: Secondary | ICD-10-CM | POA: Diagnosis present

## 2023-05-18 DIAGNOSIS — I700 Atherosclerosis of aorta: Secondary | ICD-10-CM | POA: Diagnosis not present

## 2023-05-18 DIAGNOSIS — E876 Hypokalemia: Secondary | ICD-10-CM | POA: Diagnosis present

## 2023-05-18 DIAGNOSIS — K2980 Duodenitis without bleeding: Secondary | ICD-10-CM | POA: Diagnosis present

## 2023-05-18 DIAGNOSIS — E7800 Pure hypercholesterolemia, unspecified: Secondary | ICD-10-CM | POA: Diagnosis present

## 2023-05-18 DIAGNOSIS — C321 Malignant neoplasm of supraglottis: Secondary | ICD-10-CM | POA: Diagnosis present

## 2023-05-18 DIAGNOSIS — Z510 Encounter for antineoplastic radiation therapy: Secondary | ICD-10-CM | POA: Diagnosis not present

## 2023-05-18 DIAGNOSIS — I82621 Acute embolism and thrombosis of deep veins of right upper extremity: Secondary | ICD-10-CM | POA: Diagnosis not present

## 2023-05-18 DIAGNOSIS — B182 Chronic viral hepatitis C: Secondary | ICD-10-CM | POA: Diagnosis present

## 2023-05-18 DIAGNOSIS — I6523 Occlusion and stenosis of bilateral carotid arteries: Secondary | ICD-10-CM | POA: Diagnosis not present

## 2023-05-18 DIAGNOSIS — I10 Essential (primary) hypertension: Secondary | ICD-10-CM | POA: Diagnosis present

## 2023-05-18 DIAGNOSIS — R918 Other nonspecific abnormal finding of lung field: Secondary | ICD-10-CM | POA: Diagnosis not present

## 2023-05-18 DIAGNOSIS — J40 Bronchitis, not specified as acute or chronic: Secondary | ICD-10-CM | POA: Diagnosis not present

## 2023-05-18 DIAGNOSIS — F319 Bipolar disorder, unspecified: Secondary | ICD-10-CM | POA: Diagnosis present

## 2023-05-18 DIAGNOSIS — I82C11 Acute embolism and thrombosis of right internal jugular vein: Secondary | ICD-10-CM | POA: Diagnosis present

## 2023-05-18 DIAGNOSIS — Y842 Radiological procedure and radiotherapy as the cause of abnormal reaction of the patient, or of later complication, without mention of misadventure at the time of the procedure: Secondary | ICD-10-CM | POA: Diagnosis present

## 2023-05-18 DIAGNOSIS — F17210 Nicotine dependence, cigarettes, uncomplicated: Secondary | ICD-10-CM | POA: Diagnosis present

## 2023-05-18 DIAGNOSIS — T451X5A Adverse effect of antineoplastic and immunosuppressive drugs, initial encounter: Secondary | ICD-10-CM | POA: Diagnosis present

## 2023-05-18 DIAGNOSIS — A419 Sepsis, unspecified organism: Secondary | ICD-10-CM | POA: Diagnosis present

## 2023-05-18 DIAGNOSIS — I2510 Atherosclerotic heart disease of native coronary artery without angina pectoris: Secondary | ICD-10-CM | POA: Diagnosis not present

## 2023-05-18 DIAGNOSIS — Z8719 Personal history of other diseases of the digestive system: Secondary | ICD-10-CM | POA: Diagnosis not present

## 2023-05-18 DIAGNOSIS — K2950 Unspecified chronic gastritis without bleeding: Secondary | ICD-10-CM | POA: Diagnosis present

## 2023-05-18 DIAGNOSIS — I959 Hypotension, unspecified: Secondary | ICD-10-CM | POA: Diagnosis not present

## 2023-05-18 DIAGNOSIS — F1011 Alcohol abuse, in remission: Secondary | ICD-10-CM | POA: Diagnosis present

## 2023-05-18 DIAGNOSIS — R293 Abnormal posture: Secondary | ICD-10-CM | POA: Diagnosis present

## 2023-05-18 DIAGNOSIS — F17290 Nicotine dependence, other tobacco product, uncomplicated: Secondary | ICD-10-CM | POA: Diagnosis present

## 2023-05-18 DIAGNOSIS — Z95828 Presence of other vascular implants and grafts: Secondary | ICD-10-CM | POA: Diagnosis not present

## 2023-05-18 DIAGNOSIS — Z931 Gastrostomy status: Secondary | ICD-10-CM | POA: Diagnosis not present

## 2023-05-18 DIAGNOSIS — D6481 Anemia due to antineoplastic chemotherapy: Secondary | ICD-10-CM | POA: Diagnosis present

## 2023-05-18 DIAGNOSIS — D630 Anemia in neoplastic disease: Secondary | ICD-10-CM | POA: Diagnosis present

## 2023-05-18 DIAGNOSIS — R059 Cough, unspecified: Secondary | ICD-10-CM | POA: Diagnosis not present

## 2023-05-18 DIAGNOSIS — K219 Gastro-esophageal reflux disease without esophagitis: Secondary | ICD-10-CM | POA: Diagnosis present

## 2023-05-18 DIAGNOSIS — F1411 Cocaine abuse, in remission: Secondary | ICD-10-CM | POA: Diagnosis present

## 2023-05-18 DIAGNOSIS — D62 Acute posthemorrhagic anemia: Secondary | ICD-10-CM | POA: Diagnosis not present

## 2023-05-18 DIAGNOSIS — J439 Emphysema, unspecified: Secondary | ICD-10-CM | POA: Diagnosis not present

## 2023-05-18 DIAGNOSIS — I8290 Acute embolism and thrombosis of unspecified vein: Secondary | ICD-10-CM | POA: Diagnosis not present

## 2023-05-18 DIAGNOSIS — R531 Weakness: Secondary | ICD-10-CM | POA: Diagnosis not present

## 2023-05-18 DIAGNOSIS — D649 Anemia, unspecified: Secondary | ICD-10-CM | POA: Diagnosis not present

## 2023-05-18 DIAGNOSIS — R195 Other fecal abnormalities: Secondary | ICD-10-CM | POA: Diagnosis not present

## 2023-05-18 DIAGNOSIS — R1310 Dysphagia, unspecified: Secondary | ICD-10-CM | POA: Diagnosis present

## 2023-05-18 LAB — RAD ONC ARIA SESSION SUMMARY
Course Elapsed Days: 34
Plan Fractions Treated to Date: 25
Plan Prescribed Dose Per Fraction: 2 Gy
Plan Total Fractions Prescribed: 35
Plan Total Prescribed Dose: 70 Gy
Reference Point Dosage Given to Date: 50 Gy
Reference Point Session Dosage Given: 2 Gy
Session Number: 25

## 2023-05-19 ENCOUNTER — Inpatient Hospital Stay: Payer: Medicare HMO

## 2023-05-19 ENCOUNTER — Other Ambulatory Visit: Payer: Self-pay

## 2023-05-19 ENCOUNTER — Ambulatory Visit
Admission: RE | Admit: 2023-05-19 | Discharge: 2023-05-19 | Disposition: A | Discharge status: Home / Self Care | Payer: Medicare HMO | Source: Ambulatory Visit | Attending: Radiation Oncology

## 2023-05-19 DIAGNOSIS — C321 Malignant neoplasm of supraglottis: Secondary | ICD-10-CM | POA: Diagnosis not present

## 2023-05-19 DIAGNOSIS — Z510 Encounter for antineoplastic radiation therapy: Secondary | ICD-10-CM | POA: Diagnosis not present

## 2023-05-19 LAB — RAD ONC ARIA SESSION SUMMARY
Course Elapsed Days: 35
Plan Fractions Treated to Date: 26
Plan Prescribed Dose Per Fraction: 2 Gy
Plan Total Fractions Prescribed: 35
Plan Total Prescribed Dose: 70 Gy
Reference Point Dosage Given to Date: 52 Gy
Reference Point Session Dosage Given: 2 Gy
Session Number: 26

## 2023-05-20 ENCOUNTER — Inpatient Hospital Stay: Payer: Medicare HMO

## 2023-05-20 ENCOUNTER — Other Ambulatory Visit: Payer: Self-pay

## 2023-05-20 ENCOUNTER — Encounter: Payer: Self-pay | Admitting: Physical Therapy

## 2023-05-20 ENCOUNTER — Ambulatory Visit: Payer: Medicare HMO | Admitting: Physical Therapy

## 2023-05-20 ENCOUNTER — Ambulatory Visit
Admission: RE | Admit: 2023-05-20 | Discharge: 2023-05-20 | Disposition: A | Discharge status: Home / Self Care | Payer: Medicare HMO | Source: Ambulatory Visit | Attending: Radiation Oncology | Admitting: Radiation Oncology

## 2023-05-20 ENCOUNTER — Ambulatory Visit: Payer: Medicare HMO

## 2023-05-20 DIAGNOSIS — R1310 Dysphagia, unspecified: Secondary | ICD-10-CM | POA: Insufficient documentation

## 2023-05-20 DIAGNOSIS — R490 Dysphonia: Secondary | ICD-10-CM | POA: Insufficient documentation

## 2023-05-20 DIAGNOSIS — R293 Abnormal posture: Secondary | ICD-10-CM | POA: Insufficient documentation

## 2023-05-20 DIAGNOSIS — Z510 Encounter for antineoplastic radiation therapy: Secondary | ICD-10-CM | POA: Diagnosis not present

## 2023-05-20 DIAGNOSIS — C321 Malignant neoplasm of supraglottis: Secondary | ICD-10-CM | POA: Insufficient documentation

## 2023-05-20 LAB — RAD ONC ARIA SESSION SUMMARY
Course Elapsed Days: 36
Plan Fractions Treated to Date: 27
Plan Prescribed Dose Per Fraction: 2 Gy
Plan Total Fractions Prescribed: 35
Plan Total Prescribed Dose: 70 Gy
Reference Point Dosage Given to Date: 54 Gy
Reference Point Session Dosage Given: 2 Gy
Session Number: 27

## 2023-05-20 NOTE — Therapy (Signed)
 OUTPATIENT PHYSICAL THERAPY HEAD AND NECK BASELINE EVALUATION   Patient Name: [name] MRN: [id number] DOB:Jul 19, 1970, 53 y.o., male Today's Date: 05/20/2023  END OF SESSION:  PT End of Session - 05/20/23 1014     Visit Number 1    Number of Visits 2    Date for PT Re-Evaluation 07/01/23    PT Start Time 0951    PT Stop Time 1013    PT Time Calculation (min) 22 min    Activity Tolerance Patient tolerated treatment well    Behavior During Therapy [facility] for tasks assessed/performed             Past Medical History:  Diagnosis Date   Anxiety    Bipolar 1 disorder (HCC)    EtOH dependence (HCC)    QUIT   GERD without esophagitis    Hepatitis C carrier (HCC)    Hypercholesteremia    Hypertension    laryngeal ca 02/2023   Polysubstance dependence (HCC)    COCAINE- quit   Stroke [facility])    has some ST memory deficits from stroke.   Substance induced mood disorder (HCC) 11/05/2016   Past Surgical History:  Procedure Laterality Date   ANEURYSM COILING     AGE 68 x2   APPENDECTOMY     BIOPSY  01/05/2017   Procedure: BIOPSY;  Surgeon: West Bali, MD;  [facility]: [facility];  Service: Endoscopy;;  Duodenal and Gastric   COLONOSCOPY WITH PROPOFOL N/A 01/05/2017   Procedure: COLONOSCOPY WITH PROPOFOL;  Surgeon: West Bali, MD;  Location: [facility];  Service: Endoscopy;  Laterality: N/A;  10:00am   DIRECT LARYNGOSCOPY Left 03/16/2023   Procedure: Direct laryngoscopy with biopsy of supraglottic mass;  Surgeon: Newman Pies, MD;  Location: [facility];  Service: ENT;  Laterality: Left;   ESOPHAGOGASTRODUODENOSCOPY (EGD) WITH PROPOFOL N/A 01/05/2017   Procedure: ESOPHAGOGASTRODUODENOSCOPY (EGD) WITH PROPOFOL;  Surgeon: West Bali, MD;  Location: [facility];  Service: Endoscopy;  Laterality: N/A;   IR GASTROSTOMY TUBE MOD SED  04/28/2023   IR IMAGING GUIDED PORT INSERTION  04/28/2023   IR PATIENT EVAL TECH 0-60 MINS  05/03/2023   LAPAROSCOPIC  APPENDECTOMY     MASS EXCISION Right 01/13/2017   Procedure: EXCISION LIPOMA RIGHT ARM;  Surgeon: Lucretia Roers, MD;  Location: [facility];  Service: General;  Laterality: Right;   Patient Active Problem List   Diagnosis Date Noted   Malnutrition of moderate degree 05/06/2023   Cellulitis of abdominal wall 05/06/2023   Skin infection at gastrostomy tube site (HCC) 05/03/2023   RSV infection 05/03/2023   Port-A-Cath in place 04/30/2023   Chemotherapy-induced thrombocytopenia 04/30/2023   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   Memory deficit 03/18/2023   Tobacco use disorder 03/18/2023   Panic disorder with agoraphobia 03/18/2023   Left ear pain 03/10/2023   Oropharyngeal dysphagia 03/10/2023   Essential hypertension 02/04/2023   GAD (generalized anxiety disorder) 02/04/2023   Otitis media 02/04/2023   Cocaine use disorder, severe, in sustained remission (HCC)    Major depressive disorder, recurrent episode, severe, 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   Chronic hepatitis C virus infection (HCC) G1a 12/28/2016   GERD (gastroesophageal reflux disease) 12/10/2016   Encounter for antineoplastic chemotherapy 12/10/2016   Alcohol use disorder, severe, dependence in unclear remission status     PCP: Trena Platt, MD  REFERRING PROVIDER: Lonie Peak, MD  REFERRING DIAG: C32.1 (ICD-10-CM) - Malignant neoplasm of supraglottis (HCC)   THERAPY  DIAG:  Abnormal posture  Malignant neoplasm of supraglottis (HCC)  Rationale for Evaluation and Treatment: Rehabilitation  ONSET DATE: 03/16/23  SUBJECTIVE:     SUBJECTIVE STATEMENT: Patient reports they are here today to be seen by their medical team for newly diagnosed cancer of left supraglottis.    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 He presented to the [facility] urgent care on 10/28/22 with c/o left ear pain, popping with chewing, and  history of pain radiating down the side of his neck and throat for one month. He was treated for left ear infection and prednisone. His symptoms persisted and progressed, and he was referred to Dr. Suszanne Conners on 03/10/23 at [facility]. Laryngoscopy was performed which revealed an ulcerative lesion on the posterior aspect of the epiglottis, superior to the vocal cords. The arytenoid mucosa was also mildly edematous, and the true vocal folds were pale yellow, mobile, and without mass or lesion.    03/16/23 He underwent direct laryngoscopy with biopsies with pathology showing findings consistent with invasive and in situ moderately differentiated squamous cell carcinoma. Per note by Dr Suszanne Conners, "Examination of the vallecula, piriform sinuses, and the pharyngeal mucosa were all normal. A fungating mass was noted on the posterior aspect of the left epiglottis. The mass did not appear to involve the vocal cords." 03/23/23 CT neck and PET demonstrating a left supraglottic lesion which may involve the glottis as well as a 7 mm left level 3 lymph node that is suspicious for metastatic involvement. No distant metastatic disease. He will receive 35 fractions of radiation to his Larynx and bilateral neck with weekly chemotherapy. He started on 1/15 and he will complete 06/01/23. PAC/PEG 04/28/23. 05/03/23 he developed a PEG infection and was admitted for several days. He has recovered.             PATIENT GOALS:   to be educated about the signs and symptoms of lymphedema and learn post op HEP.   PAIN:  Are you having pain? Yes: NPRS scale: 6 Pain location: throat Pain description: throbbing Aggravating factors: swallowing solid foods Relieving factors: ice  PRECAUTIONS: Active CA  RED FLAGS: Has hx of aneuryism      WEIGHT BEARING RESTRICTIONS: No  FALLS:  Has patient fallen in last 6 months? No Does the patient have a fear of falling that limits activity? No Is the patient reluctant to leave the house due to a fear of  falling?No  LIVING ENVIRONMENT: Patient lives with: wife Lives in: House/apartment Has following equipment at home: None  OCCUPATION: disabled  LEISURE: does not currently exercise  PRIOR LEVEL OF FUNCTION: Independent   OBJECTIVE: Note: Objective measures were completed at Evaluation unless otherwise noted.  COGNITION: Overall cognitive status: Within functional limits for tasks assessed                  POSTURE:  Forward head and rounded shoulders posture  30 SEC SIT TO STAND: 13 reps in 30 sec without use of UEs which is  Poor for patient's age  SHOULDER AROM:   WFL   CERVICAL AROM:   Percent limited  Flexion WFL  Extension 25% limited due to skin breakdown from radiation  Right lateral flexion WFL  Left lateral flexion WFL  Right rotation 25% limited  Left rotation 25% limited    (Blank rows=not tested)  GAIT: Assessed: Yes Assistance needed: Independent Ambulation Distance: 10 feet Assistive Device:  none Gait pattern: WFL Ambulation surface: Level  PATIENT EDUCATION:  Education details: Neck ROM, importance of posture when sitting, standing and lying down, deep breathing, walking program and importance of staying active throughout treatment, CURE article on staying active, "Why exercise?" flyer, lymphedema and PT info Person educated: Patient Education method: Explanation, Demonstration, Handout Education comprehension: Patient verbalized understanding and returned demonstration  HOME EXERCISE PROGRAM: Patient was instructed today in a home exercise program today for head and neck range of motion exercises. These included active cervical flexion, active cervical extension, active cervical rotation to each direction, upper trap stretch, and shoulder retraction. Patient was encouraged to do these 2-3 times a day, holding for 5 sec each and completing for 5 reps. Pt was educated that once this becomes easier then hold the stretches for 30-60 seconds.     ASSESSMENT:  CLINICAL IMPRESSION: Pt arrives to PT with recently diagnosed cancer of supraglottis. He will receive 35 fractions of radiation to his Larynx and bilateral neck with weekly chemotherapy. He started on 1/15 and he will complete 06/01/23. Pt's cervical ROM was limited by skin breakdown at anterior neck secondary to radiation. Educated pt about signs and symptoms of lymphedema as well as anatomy and physiology of lymphatic system. Educated pt in importance of staying as active as possible throughout treatment to decrease fatigue as well as head and neck ROM exercises to decrease loss of ROM. Will see pt after completion of radiation to reassess ROM and assess for lymphedema and to determine therapy needs at that time.  Pt will benefit from skilled therapeutic intervention to improve on the following deficits: Decreased knowledge of precautions and postural dysfunction. Other deficits: decreased knowledge of condition  PT treatment/interventions: ADL/self-care home management, pt/family education, therapeutic exercise. Other interventions 97164- PT Re-evaluation, 97110-Therapeutic exercises, 97530- Therapeutic activity, [id number]- Neuromuscular re-education, 97535- Self Care, and 16109- Manual therapy  REHAB POTENTIAL: Good  CLINICAL DECISION MAKING: Evolving/moderate complexity  EVALUATION COMPLEXITY: Moderate   GOALS: Goals reviewed with patient? YES  LONG TERM GOALS: (STG=LTG)   Name Target Date  Goal status  1 Patient will be able to verbalize understanding of a home exercise program for cervical range of motion, posture, and walking.   Baseline:  No knowledge 05/20/2023 Achieved at eval  2 Patient will be able to verbalize understanding of proper sitting and standing posture. Baseline:  No knowledge 05/20/2023 Achieved at eval  3 Patient will be able to verbalize understanding of lymphedema risk and availability of treatment for this condition Baseline:  No knowledge 05/20/2023  Achieved at eval  4 Pt will demonstrate a return to full cervical ROM and function post operatively compared to baselines and not demonstrate any signs or symptoms of lymphedema.  Baseline: See objective measurements taken today. 07/01/23 New    PLAN:  PT FREQUENCY/DURATION: EVAL and 1 follow up appointment.   PLAN FOR NEXT SESSION: will reassess 2 weeks after completion of radiation to determine needs.  Patient will follow up at outpatient cancer rehab 2 weeks after completion of radiation.  If the patient requires physical therapy at that time, a specific plan will be dictated and sent to the referring physician for approval. The patient was educated today on appropriate basic range of motion exercises to begin now and continue throughout radiation and educated on the signs and symptoms of lymphedema. Patient verbalized good understanding.     Physical Therapy Information for During and After Head/Neck Cancer Treatment: Lymphedema is a swelling condition that you may be at  risk for in your neck and/or face if you have radiation treatment to the area and/or if you have surgery that includes removing lymph nodes.  There is treatment available for this condition and it is not life-threatening.  Contact your physician or physical therapist with concerns. An excellent resource for those seeking information on lymphedema is the National Lymphedema Network's website.  It can be accessed at www.lymphnet.org If you notice swelling in your neck or face at any time following surgery (even if it is many years from now), please contact your doctor or physical therapist to discuss this.  Lymphedema can be treated at any time but it is easier for you if it is treated early on. If you have had surgery to your neck, please check with your surgeon about how soon to start doing neck range of motion exercises.  If you are not having surgery, I encourage you to start doing neck range of motion exercises today and  continue these while undergoing treatment, UNLESS you have irritation of your skin or soft tissue that is aggravated by doing them.  These exercises are intended to help you prevent loss of range of motion and/or to gain range of motion in your neck (which can be limited by tightening effects of radiation), and NOT to aggravate these tissues if they develop sensitivities from treatment. Neck range of motion exercises should be done to the point of feeling a GENTLE, TOLERABLE stretch only.  You are encouraged to start a walking or other exercise program tomorrow and continue this as much as you are able through and after treatment.  Please feel free to call me with any questions. Leonette Most, PT, CLT Physical Therapist and Certified Lymphedema Therapist [facility] [address], [address], [place], [place] [address] [phone] [email or web address]  WALKING  Walking is a great form of exercise to increase your strength, endurance and overall fitness.  A walking program can help you start slowly and gradually build endurance as you go.  Everyone's ability is different, so each person's starting point will be different.  You do not have to follow them exactly.  The are just samples. You should simply find out what's right for you and stick to that program.   In the beginning, you'll start off walking 2-3 times a day for short distances.  As you get stronger, you'll be walking further at just 1-2 times per day.  A. You Can Walk For A Certain Length Of Time Each Day    Walk 5 minutes 3 times per day.  Increase 2 minutes every 2 days (3 times per day).  Work up to 25-30 minutes (1-2 times per day).   Example:   Day 1-2 5 minutes 3 times per day   Day 7-8 12 minutes 2-3 times per day   Day 13-14 25 minutes 1-2 times per day  B. You Can Walk For a Certain Distance Each Day     Distance can be substituted for time.    Example:   3 trips to mailbox  (at road)   3 trips to corner of block   3 trips around the block  C. Go to local high school and use the track.    Walk for distance ____ around track  Or time ____ minutes  D. Walk ____ Jog ____ Run ___   Why exercise?  So many benefits! Here are SOME of them: Heart health, including raising your good cholesterol level and reducing heart rate  and blood pressure Lung health, including improved lung capacity It burns fats, and most of [place] can stand to be leaner, whether or not we are overweight. It increases the body's natural painkillers and mood elevators, so makes you feel better. Not only makes you feel better, but look better too Improves sleep Takes a bite out of stress May decrease your risk of many types of cancer If you are currently undergoing cancer treatment, exercise may improve your ability to tolerate treatments including chemotherapy. For everybody, it can improve your energy level. Those with cancer-related fatigue report a 40-50% reduction in this symptom when exercising regularly. If you are a survivor of breast, colon, or prostate cancer, it may decrease your risk of a recurrence. (This may hold for other cancers too, but so far we have data just for these three types.)  How to exercise: Get your doctor's okay. Pick something you enjoy doing, like walking, Zumba, biking, swimming, or whatever. Start at low intensity and time, then gradually increase.  (See walking program handout.) Set a goal to achieve over time.  The American Cancer Society, American Heart Association, and U.S. Dept. of Health and Human Services recommend 150 minutes of moderate exercise, 75 minutes of vigorous exercise, or a combination of both per week. This should be done in episodes at least 10 minutes long, spread throughout the week.  Need help being motivated? Pick something you enjoy doing, because you'll be more inclined to stick with that activity than something that feels like a  chore. Do it with a friend so that you are accountable to each other. Schedule it into your day. Place it on your calendar and keep that appointment just like you do any appointment that you make. Join an exercise group that meets at a specific time.  That way, you have to show up on time, and that makes it harder to procrastinate about doing your workout.  It also keeps you accountable--people begin to expect you to be there. Join a gym where you feel comfortable and not intimidated, at the right cost. Sign up for something that you'll need to be in shape for on a specific date, like a 1K or a 5K to walk or run, a 20 or 30 mile bike ride, a mud run or something like that. If the date is looming, you know you'll need to train to be ready for it.  An added benefit is that many of these are fundraisers for good causes. If you've already paid for a gym membership, group exercise class or event, you might as well work out, so you haven't wasted your money!    [facility], PT 05/20/2023, 10:15 AM

## 2023-05-20 NOTE — Addendum Note (Signed)
 Encounter addended by: Edward Qualia on: 05/20/2023 12:43 PM  Actions taken: Imaging Exam ended

## 2023-05-20 NOTE — Therapy (Signed)
 OUTPATIENT SPEECH LANGUAGE PATHOLOGY ONCOLOGY EVALUATION   Patient Name: [name] MRN: [id number] DOB:May 05, 1970, 53 y.o., male Today's Date: 05/20/2023  PCP: Trena Platt, MD REFERRING PROVIDER: Lonie Peak, MD  END OF SESSION:  End of Session - 05/20/23 1153     Visit Number 1    Number of Visits 7    Date for SLP Re-Evaluation 08/18/23    SLP Start Time 1044    SLP Stop Time  1119    SLP Time Calculation (min) 35 min    Activity Tolerance Patient tolerated treatment well             Past Medical History:  Diagnosis Date   Anxiety    Bipolar 1 disorder (HCC)    EtOH dependence (HCC)    QUIT   GERD without esophagitis    Hepatitis C carrier (HCC)    Hypercholesteremia    Hypertension    laryngeal ca 02/2023   Polysubstance dependence (HCC)    COCAINE- quit   Stroke [facility])    has some ST memory deficits from stroke.   Substance induced mood disorder (HCC) 11/05/2016   Past Surgical History:  Procedure Laterality Date   ANEURYSM COILING     AGE 70 x2   APPENDECTOMY     BIOPSY  01/05/2017   Procedure: BIOPSY;  Surgeon: West Bali, MD;  [facility]: [facility];  Service: Endoscopy;;  Duodenal and Gastric   COLONOSCOPY WITH PROPOFOL N/A 01/05/2017   Procedure: COLONOSCOPY WITH PROPOFOL;  Surgeon: West Bali, MD;  Location: [facility];  Service: Endoscopy;  Laterality: N/A;  10:00am   DIRECT LARYNGOSCOPY Left 03/16/2023   Procedure: Direct laryngoscopy with biopsy of supraglottic mass;  Surgeon: Newman Pies, MD;  Location: [facility];  Service: ENT;  Laterality: Left;   ESOPHAGOGASTRODUODENOSCOPY (EGD) WITH PROPOFOL N/A 01/05/2017   Procedure: ESOPHAGOGASTRODUODENOSCOPY (EGD) WITH PROPOFOL;  Surgeon: West Bali, MD;  Location: [facility];  Service: Endoscopy;  Laterality: N/A;   IR GASTROSTOMY TUBE MOD SED  04/28/2023   IR IMAGING GUIDED PORT INSERTION  04/28/2023   IR PATIENT EVAL TECH 0-60 MINS  05/03/2023   LAPAROSCOPIC  APPENDECTOMY     MASS EXCISION Right 01/13/2017   Procedure: EXCISION LIPOMA RIGHT ARM;  Surgeon: Lucretia Roers, MD;  Location: [facility];  Service: General;  Laterality: Right;   Patient Active Problem List   Diagnosis Date Noted   Malnutrition of moderate degree 05/06/2023   Cellulitis of abdominal wall 05/06/2023   Skin infection at gastrostomy tube site (HCC) 05/03/2023   RSV infection 05/03/2023   Port-A-Cath in place 04/30/2023   Chemotherapy-induced thrombocytopenia 04/30/2023   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   Memory deficit 03/18/2023   Tobacco use disorder 03/18/2023   Panic disorder with agoraphobia 03/18/2023   Left ear pain 03/10/2023   Oropharyngeal dysphagia 03/10/2023   Essential hypertension 02/04/2023   GAD (generalized anxiety disorder) 02/04/2023   Otitis media 02/04/2023   Cocaine use disorder, severe, in sustained remission (HCC)    Major depressive disorder, recurrent episode, severe, 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   Chronic hepatitis C virus infection (HCC) G1a 12/28/2016   GERD (gastroesophageal reflux disease) 12/10/2016   Encounter for antineoplastic chemotherapy 12/10/2016   Alcohol use disorder, severe, dependence in unclear remission status     ONSET DATE: see"pertinent history" below   REFERRING DIAG: malignant neoplasm of supraglottis  THERAPY DIAG:  Dysphagia, unspecified type - Plan: SLP plan of care  cert/re-cert  Hoarseness  Rationale for Evaluation and Treatment: Rehabilitation  SUBJECTIVE:   SUBJECTIVE STATEMENT: Pt is eating pureed and soups; 4 cans PEG tube feeding/day currently. 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 He was treated for left ear infection and prednisone. His symptoms persisted and progressed, and he was referred to Dr. Suszanne Conners on 03/10/23 at [facility]. Laryngoscopy was performed which revealed an ulcerative lesion on the posterior aspect of the epiglottis, superior to the vocal cords. The arytenoid mucosa was also mildly edematous, and the true vocal folds were pale yellow, mobile, and without mass or lesion. 03/16/23 He underwent direct laryngoscopy with biopsies with pathology showing findings consistent with invasive and in situ moderately differentiated squamous cell carcinoma. Per note by Dr Suszanne Conners, "Examination of the vallecula, piriform sinuses, and the pharyngeal mucosa were all normal. A fungating mass was noted on the posterior aspect of the left epiglottis. The mass did not appear to involve the vocal cords." 03/23/23 CT neck and PET demonstrating a left supraglottic lesion which may involve the glottis as well as a 7 mm left level 3 lymph node that is suspicious for metastatic involvement. No distant metastatic disease. 03/26/23 Consult with Dr. Basilio Cairo and 04/09/23 Consult with Dr. Arlana Pouch. He will receive chemo/radiation. Treatment plan:  He will receive 35 fractions of radiation to his Larynx and bilateral neck with weekly chemotherapy. He started on 1/15 and he will complete 06/01/23.   PAIN:  Are you having pain? Yes: NPRS scale: 6-10 Pain location: throat Pain description: sore Aggravating factors: "swallowing a solid" Relieving factors: meds  FALLS: Has patient fallen in last 6 months?  See PT evaluation for details   PLOF:  Level of assistance: Independent with ADLs, Independent with IADLs Employment: Other: not working  PATIENT GOALS: Have normal swallowing throughout  OBJECTIVE:  Note: Objective measures were completed at Evaluation unless otherwise noted.   COGNITION: Overall cognitive status: Within functional limits for tasks assessed  LANGUAGE: Receptive and  Expressive language appeared WNL.  ORAL MOTOR EXAMINATION: Overall status: WFL  MOTOR SPEECH: Overall motor speech: Appears intact Mild hoarseness noted, mild intermittent aphonia  SUBJECTIVE DYSPHAGIA REPORTS:  Date of onset: 1-2 weeks ago Reported symptoms: odynophagia  Current diet: Dysphagia 1 (puree) and thin liquids   FACTORS WHICH MAY INCREASE RISK OF ADVERSE EVENT IN PRESENCE OF ASPIRATION:  General health: well appearing - however undergoing head/neck ChRT  Risk factors: compromised immune system  CLINICAL SWALLOW ASSESSMENT:   Dentition: adequate natural dentition Vocal quality at baseline: hoarse and aphonic Patient directly observed with POs: Yes: dysphagia 1 (puree) and thin liquids  Feeding: able to feed self Liquids provided by: cup Oral phase signs and symptoms:  none noted with applesauce - pt reported little pain Pharyngeal phase signs and symptoms:  none noted today  TREATMENT DATE:   05/20/23 (eval): Research states the risk for dysphagia increases due to radiation and/or chemotherapy treatment due to a variety of factors, so SLP educated the pt about the possibility of reduced/limited ability for PO intake during rad tx. SLP also educated pt regarding possible changes to swallowing musculature after rad tx, and why adherence to dysphagia HEP provided today and PO consumption was necessary to inhibit muscle fibrosis following rad tx and to mitigate muscle disuse atrophy. SLP informed pt why this would be detrimental to their swallowing status and to their pulmonary health. Pt demonstrated understanding of these things to SLP. SLP encouraged pt to safely eat and drink as deep into their radiation/chemotherapy as possible to provide the best possible long-term swallowing outcome for pt.  SLP then developed an individualized HEP for pt involving  oral and pharyngeal and vocal  strengthening and ROM and pt was instructed how to perform these exercises, including SLP demonstration. After SLP demonstration, pt return demonstrated each exercise. SLP ensured pt performance was correct prior to educating pt on next exercise. Pt required usual demo cues faded to modified independent to perform HEP. Pt was instructed to complete this program 6-7 days/week, at least 20 reps/day (except [place]) until 6 months after his or her last day of rad tx, and then x2 a week after that, indefinitely. Among other modifications for days when pt cannot functionally swallow, SLP also suggested pt to perform only non-swallowing tasks on the handout/HEP, and if necessary to cycle through the swallowing portion so the full program of exercises can be completed instead of fatiguing on one of the swallowing exercises and being unable to perform the other swallowing exercises. SLP instructed that swallowing exercises should then be added back into the regimen as pt is able to do so.   PATIENT EDUCATION: Education details: late effects head/neck radiation on swallow function, HEP procedure, and modification to HEP when difficulty experienced with swallowing during and after radiation course Person educated: Patient and Child(ren) Education method: Explanation, Demonstration, Verbal cues, and Handouts Education comprehension: verbalized understanding, returned demonstration, verbal cues required, and needs further education   ASSESSMENT:  CLINICAL IMPRESSION: Patient is a 53 y.o. M who was seen today for assessment of swallowing as they undergo radiation/chemoradiation therapy. Today pt ate items from Dys I and drank thin liquids without overt s/s oral or pharyngeal difficulty. At this time pt swallowing is deemed WNL/WFL with these POs. As chemo-rad tx continues pt may feel safer switching to tube feeding, but he was encouraged to cont to have POs as far into his tx as  possible. No oral or overt s/sx pharyngeal deficits, including aspiration were observed. There are no overt s/s aspiration PNA observed by SLP nor any reported by pt at this time. Data indicate that pt's swallow ability will likely decrease over the course of radiation/chemoradiation therapy and could very well decline over time following the conclusion of that therapy due to muscle disuse atrophy and/or muscle fibrosis. Pt will cont to need to be seen by SLP in order to assess safety of PO intake, assess the need for recommending any objective swallow assessment, and ensuring pt is correctly completing the individualized HEP.  OBJECTIVE IMPAIRMENTS: include voice disorder and dysphagia. These impairments are limiting patient from safety when swallowing. Factors affecting potential to achieve goals and functional outcome are  none noted today . Patient will benefit from skilled SLP services to address above impairments and improve overall function.  REHAB POTENTIAL: Excellent  GOALS: Goals reviewed with patient? Generally, yes   SHORT TERM GOALS: Target: 3rd total session   1. Pt will compelte HEP with modified independence in 2 sessions Baseline: Goal status: INITIAL   2.  pt will tell SLP why pt is completing HEP with modified independence Baseline:  Goal status: INITIAL   3.  pt will describe 3 overt s/s aspiration PNA with modified independence Baseline:  Goal status: INITIAL   4.  pt will tell SLP how a food journal could ease return to a more normalized diet Baseline:  Goal status: INITIAL     LONG TERM GOALS: Target: 7th total session   1.  pt will complete HEP with independence over two visits Baseline:  Goal status: INITIAL   2.  pt will describe how to modify HEP over time, and the timeline associated with reduction in HEP frequency with modified independence over two sessions Baseline:  Goal status: INITIAL   PLAN:   SLP FREQUENCY:  once approx every 4 weeks    SLP DURATION:  7 sessions   PLANNED INTERVENTIONS: Aspiration precaution training, Pharyngeal strengthening exercises, Diet toleration management , Trials of upgraded texture/liquids, SLP instruction and feedback, Compensatory strategies, and Patient/family education   [facility], CCC-SLP 05/20/2023, 11:57 AM

## 2023-05-21 ENCOUNTER — Inpatient Hospital Stay (HOSPITAL_COMMUNITY)
Admission: EM | Admit: 2023-05-21 | Discharge: 2023-05-27 | DRG: 871 | Disposition: A | Discharge status: Home Health | Payer: Medicare HMO | Attending: Internal Medicine | Admitting: Internal Medicine

## 2023-05-21 ENCOUNTER — Inpatient Hospital Stay (HOSPITAL_COMMUNITY): Payer: Medicare HMO

## 2023-05-21 ENCOUNTER — Emergency Department (HOSPITAL_COMMUNITY): Payer: Medicare HMO

## 2023-05-21 ENCOUNTER — Other Ambulatory Visit: Payer: Self-pay

## 2023-05-21 ENCOUNTER — Encounter (HOSPITAL_COMMUNITY): Payer: Self-pay | Admitting: Emergency Medicine

## 2023-05-21 ENCOUNTER — Ambulatory Visit: Payer: Medicare HMO

## 2023-05-21 ENCOUNTER — Inpatient Hospital Stay: Payer: Medicare HMO

## 2023-05-21 DIAGNOSIS — R059 Cough, unspecified: Secondary | ICD-10-CM | POA: Diagnosis not present

## 2023-05-21 DIAGNOSIS — F17290 Nicotine dependence, other tobacco product, uncomplicated: Secondary | ICD-10-CM | POA: Diagnosis present

## 2023-05-21 DIAGNOSIS — C321 Malignant neoplasm of supraglottis: Secondary | ICD-10-CM | POA: Diagnosis present

## 2023-05-21 DIAGNOSIS — Z931 Gastrostomy status: Secondary | ICD-10-CM

## 2023-05-21 DIAGNOSIS — Y842 Radiological procedure and radiotherapy as the cause of abnormal reaction of the patient, or of later complication, without mention of misadventure at the time of the procedure: Secondary | ICD-10-CM | POA: Diagnosis present

## 2023-05-21 DIAGNOSIS — D500 Iron deficiency anemia secondary to blood loss (chronic): Secondary | ICD-10-CM

## 2023-05-21 DIAGNOSIS — J439 Emphysema, unspecified: Secondary | ICD-10-CM | POA: Diagnosis not present

## 2023-05-21 DIAGNOSIS — B182 Chronic viral hepatitis C: Secondary | ICD-10-CM | POA: Diagnosis present

## 2023-05-21 DIAGNOSIS — R55 Syncope and collapse: Principal | ICD-10-CM | POA: Diagnosis present

## 2023-05-21 DIAGNOSIS — Z7901 Long term (current) use of anticoagulants: Secondary | ICD-10-CM

## 2023-05-21 DIAGNOSIS — K2980 Duodenitis without bleeding: Secondary | ICD-10-CM | POA: Diagnosis present

## 2023-05-21 DIAGNOSIS — D6481 Anemia due to antineoplastic chemotherapy: Secondary | ICD-10-CM | POA: Diagnosis present

## 2023-05-21 DIAGNOSIS — I959 Hypotension, unspecified: Secondary | ICD-10-CM

## 2023-05-21 DIAGNOSIS — Z95828 Presence of other vascular implants and grafts: Secondary | ICD-10-CM | POA: Diagnosis not present

## 2023-05-21 DIAGNOSIS — F319 Bipolar disorder, unspecified: Secondary | ICD-10-CM | POA: Diagnosis present

## 2023-05-21 DIAGNOSIS — I6523 Occlusion and stenosis of bilateral carotid arteries: Secondary | ICD-10-CM | POA: Diagnosis not present

## 2023-05-21 DIAGNOSIS — E7800 Pure hypercholesterolemia, unspecified: Secondary | ICD-10-CM | POA: Diagnosis present

## 2023-05-21 DIAGNOSIS — F17210 Nicotine dependence, cigarettes, uncomplicated: Secondary | ICD-10-CM | POA: Diagnosis present

## 2023-05-21 DIAGNOSIS — F1411 Cocaine abuse, in remission: Secondary | ICD-10-CM | POA: Diagnosis present

## 2023-05-21 DIAGNOSIS — D62 Acute posthemorrhagic anemia: Secondary | ICD-10-CM | POA: Diagnosis not present

## 2023-05-21 DIAGNOSIS — Z1152 Encounter for screening for COVID-19: Secondary | ICD-10-CM

## 2023-05-21 DIAGNOSIS — R293 Abnormal posture: Secondary | ICD-10-CM | POA: Diagnosis present

## 2023-05-21 DIAGNOSIS — J40 Bronchitis, not specified as acute or chronic: Secondary | ICD-10-CM | POA: Diagnosis not present

## 2023-05-21 DIAGNOSIS — Z800 Family history of malignant neoplasm of digestive organs: Secondary | ICD-10-CM

## 2023-05-21 DIAGNOSIS — Z8042 Family history of malignant neoplasm of prostate: Secondary | ICD-10-CM

## 2023-05-21 DIAGNOSIS — I82C11 Acute embolism and thrombosis of right internal jugular vein: Secondary | ICD-10-CM | POA: Diagnosis present

## 2023-05-21 DIAGNOSIS — R531 Weakness: Secondary | ICD-10-CM | POA: Diagnosis not present

## 2023-05-21 DIAGNOSIS — I82621 Acute embolism and thrombosis of deep veins of right upper extremity: Secondary | ICD-10-CM | POA: Diagnosis not present

## 2023-05-21 DIAGNOSIS — I10 Essential (primary) hypertension: Secondary | ICD-10-CM | POA: Diagnosis present

## 2023-05-21 DIAGNOSIS — T451X5A Adverse effect of antineoplastic and immunosuppressive drugs, initial encounter: Secondary | ICD-10-CM | POA: Diagnosis present

## 2023-05-21 DIAGNOSIS — J189 Pneumonia, unspecified organism: Secondary | ICD-10-CM | POA: Diagnosis present

## 2023-05-21 DIAGNOSIS — E876 Hypokalemia: Secondary | ICD-10-CM | POA: Diagnosis present

## 2023-05-21 DIAGNOSIS — I8290 Acute embolism and thrombosis of unspecified vein: Secondary | ICD-10-CM | POA: Diagnosis not present

## 2023-05-21 DIAGNOSIS — Z959 Presence of cardiac and vascular implant and graft, unspecified: Secondary | ICD-10-CM

## 2023-05-21 DIAGNOSIS — I700 Atherosclerosis of aorta: Secondary | ICD-10-CM | POA: Diagnosis not present

## 2023-05-21 DIAGNOSIS — F1011 Alcohol abuse, in remission: Secondary | ICD-10-CM | POA: Diagnosis present

## 2023-05-21 DIAGNOSIS — Z8673 Personal history of transient ischemic attack (TIA), and cerebral infarction without residual deficits: Secondary | ICD-10-CM

## 2023-05-21 DIAGNOSIS — D630 Anemia in neoplastic disease: Secondary | ICD-10-CM | POA: Diagnosis present

## 2023-05-21 DIAGNOSIS — Z79899 Other long term (current) drug therapy: Secondary | ICD-10-CM

## 2023-05-21 DIAGNOSIS — R918 Other nonspecific abnormal finding of lung field: Secondary | ICD-10-CM | POA: Diagnosis not present

## 2023-05-21 DIAGNOSIS — K219 Gastro-esophageal reflux disease without esophagitis: Secondary | ICD-10-CM | POA: Diagnosis present

## 2023-05-21 DIAGNOSIS — Z8719 Personal history of other diseases of the digestive system: Secondary | ICD-10-CM | POA: Diagnosis not present

## 2023-05-21 DIAGNOSIS — I82409 Acute embolism and thrombosis of unspecified deep veins of unspecified lower extremity: Secondary | ICD-10-CM | POA: Diagnosis present

## 2023-05-21 DIAGNOSIS — Z923 Personal history of irradiation: Secondary | ICD-10-CM

## 2023-05-21 DIAGNOSIS — R195 Other fecal abnormalities: Secondary | ICD-10-CM | POA: Diagnosis not present

## 2023-05-21 DIAGNOSIS — I2510 Atherosclerotic heart disease of native coronary artery without angina pectoris: Secondary | ICD-10-CM | POA: Diagnosis not present

## 2023-05-21 DIAGNOSIS — K2950 Unspecified chronic gastritis without bleeding: Secondary | ICD-10-CM | POA: Diagnosis present

## 2023-05-21 DIAGNOSIS — R1310 Dysphagia, unspecified: Secondary | ICD-10-CM | POA: Diagnosis present

## 2023-05-21 DIAGNOSIS — Z7952 Long term (current) use of systemic steroids: Secondary | ICD-10-CM

## 2023-05-21 DIAGNOSIS — A419 Sepsis, unspecified organism: Secondary | ICD-10-CM | POA: Diagnosis present

## 2023-05-21 DIAGNOSIS — D649 Anemia, unspecified: Secondary | ICD-10-CM | POA: Diagnosis not present

## 2023-05-21 HISTORY — DX: Syncope and collapse: R55

## 2023-05-21 LAB — COMPREHENSIVE METABOLIC PANEL
ALT: 14 U/L (ref 0–44)
AST: 14 U/L — ABNORMAL LOW (ref 15–41)
Albumin: 3.4 g/dL — ABNORMAL LOW (ref 3.5–5.0)
Alkaline Phosphatase: 44 U/L (ref 38–126)
Anion gap: 14 (ref 5–15)
BUN: 13 mg/dL (ref 6–20)
CO2: 22 mmol/L (ref 22–32)
Calcium: 8.9 mg/dL (ref 8.9–10.3)
Chloride: 101 mmol/L (ref 98–111)
Creatinine, Ser: 1.14 mg/dL (ref 0.61–1.24)
GFR, Estimated: >60 mL/min (ref >60)
Glucose, Bld: 104 mg/dL — ABNORMAL HIGH (ref 70–99)
Potassium: 2.9 mmol/L — ABNORMAL LOW (ref 3.5–5.1)
Sodium: 137 mmol/L (ref 135–145)
Total Bilirubin: 0.6 mg/dL (ref 0.0–1.2)
Total Protein: 7.2 g/dL (ref 6.5–8.1)

## 2023-05-21 LAB — URINALYSIS, W/ REFLEX TO CULTURE (INFECTION SUSPECTED)
Bacteria, UA: NONE SEEN
Bilirubin Urine: NEGATIVE
Glucose, UA: NEGATIVE mg/dL
Hgb urine dipstick: NEGATIVE
Ketones, ur: NEGATIVE mg/dL
Leukocytes,Ua: NEGATIVE
Nitrite: NEGATIVE
Protein, ur: NEGATIVE mg/dL
Specific Gravity, Urine: 1.004 — ABNORMAL LOW (ref 1.005–1.030)
pH: 6 (ref 5.0–8.0)

## 2023-05-21 LAB — LACTIC ACID, PLASMA
Lactic Acid, Venous: 3.7 mmol/L (ref 0.5–1.9)
Lactic Acid, Venous: 2.3 mmol/L (ref 0.5–1.9)

## 2023-05-21 LAB — CORTISOL-AM, BLOOD: Cortisol - AM: 4.7 ug/dL — ABNORMAL LOW (ref 6.7–22.6)

## 2023-05-21 LAB — LIPASE, BLOOD: Lipase: 44 U/L (ref 11–51)

## 2023-05-21 LAB — CBG MONITORING, ED: Glucose-Capillary: 113 mg/dL — ABNORMAL HIGH (ref 70–99)

## 2023-05-21 LAB — CBC WITH DIFFERENTIAL/PLATELET
Abs Immature Granulocytes: 0.01 10*3/uL (ref 0.00–0.07)
Basophils Absolute: 0 10*3/uL (ref 0.0–0.1)
Basophils Relative: 0 %
Eosinophils Absolute: 0 10*3/uL (ref 0.0–0.5)
Eosinophils Relative: 0 %
HCT: 36.9 % — ABNORMAL LOW (ref 39.0–52.0)
Hemoglobin: 12.3 g/dL — ABNORMAL LOW (ref 13.0–17.0)
Immature Granulocytes: 0 %
Lymphocytes Relative: 19 %
Lymphs Abs: 0.6 10*3/uL — ABNORMAL LOW (ref 0.7–4.0)
MCH: 29.9 pg (ref 26.0–34.0)
MCHC: 33.3 g/dL (ref 30.0–36.0)
MCV: 89.6 fL (ref 80.0–100.0)
Monocytes Absolute: 0.6 10*3/uL (ref 0.1–1.0)
Monocytes Relative: 18 %
Neutro Abs: 2 10*3/uL (ref 1.7–7.7)
Neutrophils Relative %: 63 %
Platelets: 169 10*3/uL (ref 150–400)
RBC: 4.12 MIL/uL — ABNORMAL LOW (ref 4.22–5.81)
RDW: 13 % (ref 11.5–15.5)
WBC: 3.2 10*3/uL — ABNORMAL LOW (ref 4.0–10.5)
nRBC: 0 % (ref 0.0–0.2)

## 2023-05-21 LAB — TROPONIN I (HIGH SENSITIVITY): Troponin I (High Sensitivity): <2 ng/L (ref <18)

## 2023-05-21 LAB — RESP PANEL BY RT-PCR (RSV, FLU A&B, COVID)  RVPGX2
Influenza A by PCR: NEGATIVE
Influenza B by PCR: NEGATIVE
Resp Syncytial Virus by PCR: NEGATIVE
SARS Coronavirus 2 by RT PCR: NEGATIVE

## 2023-05-21 LAB — HEPARIN LEVEL (UNFRACTIONATED): Heparin Unfractionated: 0.66 [IU]/mL (ref 0.30–0.70)

## 2023-05-21 LAB — MAGNESIUM: Magnesium: 2.2 mg/dL (ref 1.7–2.4)

## 2023-05-21 MED ORDER — FREE WATER
200.0000 mL | Status: DC
  Administered 2023-05-21 – 2023-05-27 (×36): 200 mL

## 2023-05-21 MED ORDER — POTASSIUM CHLORIDE 20 MEQ PO PACK
40.0000 meq | PACK | ORAL | Status: AC
  Administered 2023-05-21 (×2): 40 meq
  Filled 2023-05-21 (×2): qty 2

## 2023-05-21 MED ORDER — PROSOURCE TF20 ENFIT COMPATIBL EN LIQD
60.0000 mL | Freq: Every day | ENTERAL | Status: DC
  Filled 2023-05-21: qty 60

## 2023-05-21 MED ORDER — ONDANSETRON HCL 4 MG/2ML IJ SOLN: 4.0000 mg | Freq: Four times a day (QID) | INTRAMUSCULAR | Status: DC | PRN

## 2023-05-21 MED ORDER — SODIUM CHLORIDE 0.9 % IV SOLN: INTRAVENOUS | Status: AC

## 2023-05-21 MED ORDER — FLUOXETINE HCL 20 MG PO CAPS
20.0000 mg | ORAL_CAPSULE | Freq: Every day | ORAL | Status: DC
  Administered 2023-05-21 – 2023-05-27 (×7): 20 mg
  Filled 2023-05-21 (×7): qty 1

## 2023-05-21 MED ORDER — QUETIAPINE FUMARATE 25 MG PO TABS
50.0000 mg | ORAL_TABLET | Freq: Every day | ORAL | Status: DC
Start: 2023-05-21 — End: 2023-05-27
  Administered 2023-05-21 – 2023-05-26 (×6): 50 mg
  Filled 2023-05-21 (×6): qty 2

## 2023-05-21 MED ORDER — METOPROLOL SUCCINATE ER 25 MG PO TB24
25.0000 mg | ORAL_TABLET | Freq: Every day | ORAL | Status: DC
  Administered 2023-05-22 – 2023-05-24 (×2): 25 mg via ORAL
  Filled 2023-05-21 (×3): qty 1

## 2023-05-21 MED ORDER — IOHEXOL 350 MG/ML SOLN
75.0000 mL | Freq: Once | INTRAVENOUS | Status: AC | PRN
  Administered 2023-05-21: 75 mL via INTRAVENOUS

## 2023-05-21 MED ORDER — ONDANSETRON HCL 4 MG PO TABS: 4.0000 mg | ORAL_TABLET | Freq: Four times a day (QID) | ORAL | Status: DC | PRN

## 2023-05-21 MED ORDER — SODIUM CHLORIDE 0.9% FLUSH
3.0000 mL | Freq: Two times a day (BID) | INTRAVENOUS | Status: DC
  Administered 2023-05-21 – 2023-05-27 (×11): 3 mL via INTRAVENOUS

## 2023-05-21 MED ORDER — POTASSIUM CHLORIDE 10 MEQ/100ML IV SOLN
10.0000 meq | Freq: Once | INTRAVENOUS | Status: AC
  Administered 2023-05-21: 10 meq via INTRAVENOUS
  Filled 2023-05-21: qty 100

## 2023-05-21 MED ORDER — SODIUM CHLORIDE 0.9% FLUSH: 3.0000 mL | INTRAVENOUS | Status: DC | PRN

## 2023-05-21 MED ORDER — BISACODYL 10 MG RE SUPP: 10.0000 mg | Freq: Every day | RECTAL | Status: DC | PRN

## 2023-05-21 MED ORDER — FAMOTIDINE 20 MG PO TABS
40.0000 mg | ORAL_TABLET | Freq: Every day | ORAL | Status: DC
  Administered 2023-05-21 – 2023-05-27 (×7): 40 mg
  Filled 2023-05-21 (×7): qty 2

## 2023-05-21 MED ORDER — ACETAMINOPHEN 325 MG PO TABS
650.0000 mg | ORAL_TABLET | Freq: Four times a day (QID) | ORAL | Status: DC | PRN
  Administered 2023-05-21 – 2023-05-22 (×4): 650 mg
  Filled 2023-05-21 (×4): qty 2

## 2023-05-21 MED ORDER — HEPARIN (PORCINE) 25000 UT/250ML-% IV SOLN
1100.0000 [IU]/h | INTRAVENOUS | Status: DC
  Administered 2023-05-21: 1100 [IU]/h via INTRAVENOUS
  Filled 2023-05-21: qty 250

## 2023-05-21 MED ORDER — HYDROXYZINE HCL 25 MG PO TABS
25.0000 mg | ORAL_TABLET | Freq: Three times a day (TID) | ORAL | Status: DC | PRN
  Administered 2023-05-23 – 2023-05-25 (×3): 25 mg
  Filled 2023-05-21 (×3): qty 1

## 2023-05-21 MED ORDER — SODIUM CHLORIDE 0.9 % IV SOLN: INTRAVENOUS | Status: AC | PRN

## 2023-05-21 MED ORDER — POLYETHYLENE GLYCOL 3350 17 G PO PACK: 17.0000 g | PACK | Freq: Every day | ORAL | Status: DC | PRN

## 2023-05-21 MED ORDER — OSMOLITE 1.5 CAL PO LIQD
1000.0000 mL | ORAL | Status: DC
  Administered 2023-05-21 – 2023-05-22 (×2): 1000 mL
  Filled 2023-05-21 (×2): qty 1000

## 2023-05-21 MED ORDER — SODIUM CHLORIDE 0.9 % IV BOLUS
1000.0000 mL | Freq: Once | INTRAVENOUS | Status: AC
  Administered 2023-05-21: 1000 mL via INTRAVENOUS

## 2023-05-21 MED ORDER — HEPARIN SODIUM (PORCINE) 5000 UNIT/ML IJ SOLN
5000.0000 [IU] | Freq: Three times a day (TID) | INTRAMUSCULAR | Status: DC
  Filled 2023-05-21: qty 1

## 2023-05-21 MED ORDER — SODIUM CHLORIDE 0.9% FLUSH
3.0000 mL | Freq: Two times a day (BID) | INTRAVENOUS | Status: DC
  Administered 2023-05-21 – 2023-05-27 (×7): 3 mL via INTRAVENOUS

## 2023-05-21 MED ORDER — ACETAMINOPHEN 650 MG RE SUPP: 650.0000 mg | Freq: Four times a day (QID) | RECTAL | Status: DC | PRN

## 2023-05-21 MED ORDER — COSYNTROPIN 0.25 MG IJ SOLR
0.2500 mg | Freq: Once | INTRAMUSCULAR | Status: AC
  Administered 2023-05-22: 0.25 mg via INTRAVENOUS
  Filled 2023-05-21: qty 0.25

## 2023-05-21 MED ORDER — HEPARIN BOLUS VIA INFUSION
4000.0000 [IU] | Freq: Once | INTRAVENOUS | Status: AC
  Administered 2023-05-21: 4000 [IU] via INTRAVENOUS

## 2023-05-21 MED ORDER — SCOPOLAMINE 1 MG/3DAYS TD PT72: 1.0000 | MEDICATED_PATCH | TRANSDERMAL | Status: DC

## 2023-05-21 MED FILL — Fosaprepitant Dimeglumine For IV Infusion 150 MG (Base Eq): INTRAVENOUS | Qty: 5 | Status: AC

## 2023-05-21 NOTE — Consult Note (Deleted)
 PHARMACY - ANTICOAGULATION CONSULT NOTE  Pharmacy Consult for heparin Indication: DVT  No Known Allergies  Patient Measurements: Height: 5\' 4"  (162.6 cm) Weight: 68 kg (150 lb) IBW/kg (Calculated) : 59.2 Heparin Dosing Weight: 68 kg  Vital Signs: Temp: 98.4 F (36.9 C) (02/21 1323) Temp Source: Oral (02/21 1323) BP: 130/76 (02/21 1323) Pulse Rate: 76 (02/21 1323)  Labs: Recent Labs    05/21/23 0148  HGB 12.3*  HCT 36.9*  PLT 169  CREATININE 1.14    Estimated Creatinine Clearance: 62 mL/min (by C-G formula based on SCr of 1.14 mg/dL).   Medical History: Past Medical History:  Diagnosis Date   Anxiety    Bipolar 1 disorder (HCC)    EtOH dependence (HCC)    QUIT   GERD without esophagitis    Hepatitis C carrier (HCC)    Hypercholesteremia    Hypertension    laryngeal ca 02/2023   Polysubstance dependence (HCC)    COCAINE- quit   Stroke [facility])    has some ST memory deficits from stroke.   Substance induced mood disorder (HCC) 11/05/2016    Medications:  (Not in a hospital admission)  Scheduled:   famotidine  40 mg Per Tube Daily   FLUoxetine  20 mg Per Tube Daily   free water  200 mL Per Tube Q4H   heparin  4,000 Units Intravenous Once   heparin  5,000 Units Subcutaneous Q8H   [START ON 05/22/2023] metoprolol succinate  25 mg Oral Daily   potassium chloride  40 mEq Per Tube Q3H   QUEtiapine  50 mg Per Tube QHS   sodium chloride flush  3 mL Intravenous Q12H   sodium chloride flush  3 mL Intravenous Q12H    Assessment: Pharmacy consulted to dose heparin in patient with DVT. Patient is not on anticoagulation prior to admission.  Goal of Therapy:  Heparin level 0.3-0.7 units/ml Monitor platelets by anticoagulation protocol: Yes   Plan:  Give 5500 units bolus x 1 Start heparin infusion at 1200 units/hr Check anti-Xa level in 6-8 hours and daily while on heparin Continue to monitor H&H and platelets  [employer]  D 05/21/2023,2:11 PM

## 2023-05-21 NOTE — Progress Notes (Signed)
 PHARMACY - ANTICOAGULATION CONSULT NOTE  Pharmacy Consult for Heparin Indication: DVT  No Known Allergies  Patient Measurements: Height: 5\' 4"  (162.6 cm) Weight: 68 kg (150 lb) IBW/kg (Calculated) : 59.2 HEPARIN DW (KG): 68   Vital Signs: Temp: 98.4 F (36.9 C) (02/21 1323) Temp Source: Oral (02/21 1323) BP: 130/76 (02/21 1323) Pulse Rate: 76 (02/21 1323)  Labs: Recent Labs    05/21/23 0148  HGB 12.3*  HCT 36.9*  PLT 169  CREATININE 1.14    Estimated Creatinine Clearance: 62 mL/min (by C-G formula based on SCr of 1.14 mg/dL).   Medical History: Past Medical History:  Diagnosis Date   Anxiety    Bipolar 1 disorder (HCC)    EtOH dependence (HCC)    QUIT   GERD without esophagitis    Hepatitis C carrier (HCC)    Hypercholesteremia    Hypertension    laryngeal ca 02/2023   Polysubstance dependence (HCC)    COCAINE- quit   Stroke [facility])    has some ST memory deficits from stroke.   Substance induced mood disorder (HCC) 11/05/2016    Medications:  See med rec  Assessment: 53 year old male with past medical history of throat cancer currently undergoing chemotherapy and radiation. Patient brought by EMS after a syncopal episode, Patient had bilateral carotid duplex u/s and showed acute occlusive thrombus throughout the right internal jugular vein. Not on oral anticoagulants prior to admission. Pharmacy asked to start heparin  Goal of Therapy:  Heparin level 0.3-0.7 units/ml Monitor platelets by anticoagulation protocol: Yes   Plan:  Give 4000 units bolus x 1 Start heparin infusion at 1100 units/hr Check anti-Xa level in ~6-8 hours and daily while on heparin Continue to monitor H&H and platelets  Elder Cyphers, BS Pharm D, BCPS Clinical Pharmacist 05/21/2023,2:10 PM

## 2023-05-21 NOTE — Progress Notes (Signed)
 Initial Nutrition Assessment  DOCUMENTATION CODES:   Not applicable  INTERVENTION:   Initiate tube feeding via G-tube, will start with continuous feedings since patient has not been able to tolerate large volume boluses at home: Osmolite 1.5 at 30 ml/h, increase by 10 ml every 4 hours to goal rate of 70 ml/h (1680 ml per day) Provides 2520 kcal, 105 gm protein, 1280 ml free water daily. Free water flushes 200 ml q4h for a total of 2480 ml free water daily (TF + flushes).   When patient tolerates TF at goal rate, can transition to bolus feedings:  Goal is Osmolite 1.5 420 ml QID, start with 240 ml and increase by 60 ml q24h until reaching goal of 420 ml QID.  Free water flushes 60 ml before and after each bolus feeding plus 720 ml additional free water given as flushes throughout the day with meds, etc.   NUTRITION DIAGNOSIS:   Inadequate oral intake related to dysphagia, cancer and cancer related treatments as evidenced by NPO status.  GOAL:   Patient will meet greater than or equal to 90% of their needs  MONITOR:   TF tolerance  REASON FOR ASSESSMENT:   Consult Enteral/tube feeding initiation and management, Assessment of nutrition requirement/status  ASSESSMENT:   53 yo male admitted S/P syncopal episode. 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  G-tube was placed on 1/29. Patient has been taking 4 cartons of Osmolite 1.5 tube feeding per day as boluses. He has been able to tolerate thin liquids and pureed foods at home, such as soups, yogurt, and jello. Currently NPO. He just had an outpatient dysphagia treatment session with SLP 2/20. Outpatient RD notes reviewed. Patient has recently only been able to get in 3 cartons of formula per day d/t feeling full. He is working up to 7 cartons of Osmolite 1.5 per day.  During last hospitalization ~2 weeks ago, patient was identified to have moderate malnutrition.  Patient is currently in the ED; unable to speak to patient or complete NFPE at this time.   Labs reviewed. K 2.9, Hgb 12.3 CBG: 113  Medications reviewed and include pepcid, klor-con, IV potassium chloride x 1 run today.  Weight history reviewed. Patient lost from 70.5 kg on 04/19/23 to 66.2 kg on 05/21/23. 6% weight loss within a month is severe for the time frame. Current weight 68 kg (150 lbs) suspect is a stated or estimated weight.  Suspect malnutrition is ongoing, however, unable to obtain enough information at this time for identification of malnutrition.   NUTRITION - FOCUSED PHYSICAL EXAM:  Unable to complete  Diet Order:   Diet Order             Diet NPO time specified  Diet effective now                   EDUCATION NEEDS:   Not appropriate for education at this time  Skin:  Skin Assessment: Reviewed RN Assessment  Last BM:  no BM documented  Height:   Ht Readings from Last 1 Encounters:  05/21/23 5\' 4"  (1.626 m)    Weight:   Wt Readings from Last 1 Encounters:  05/21/23 68 kg    Ideal Body Weight:  59.1 kg  BMI:  Body mass index is 25.75 kg/m.  Estimated Nutritional Needs:   Kcal:  2200-2400  Protein:  105-125 gm  Fluid:  >/= 2.1 L   Gabriel Rainwater RD, LDN, CNSC Contact via secure chat. If unavailable, use group chat "RD Inpatient."

## 2023-05-21 NOTE — H&P (Addendum)
 History and Physical   Patient: [name]:[id number] DOB: [birth year] DOA: 05/21/2023 DOS: the patient was seen and examined on 05/21/2023 PCP: [name], MD  Patient coming from: Home  Chief Complaint:  Chief Complaint  Patient presents with   Loss of Consciousness   HPI: [name] is a 53 y.o. 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  Patient has poor recollection of events he said he just ended up on the floor- -family says patient was hallucinating and talking to somebody who was in the air when he came around -In the ED patient is alert and oriented x 4..  Denies chest pains palpitations or dizziness -Patient with fevers up to 101.1 --has Nausea and poor appetite but no emesis and no diarrhea -Patient reports fatigue and generalized weakness -he does radiation Monday through Friday and takes chemo on Mondays. -In the ED COVID, flu and RSV negative -Chest x-ray without acute cardiopulmonary findings -LE Extremity venous Dopplers without DVT -CTA chest without PE, emphysema noted, bronchitis noted -Carotid artery Dopplers with incidental finding of--Incidental note is made of acute occlusive thrombus throughout the right internal jugular vein. -No hemodynamically significant carotid artery stenosis -Lactic acid 3.7 repeat 2.3--- IV fluids ordered -UA not suggestive of UTI, blood cultures requested -A.m. cortisol is 4.7--- ACTH stimulation test ordered -Lipase WNL EKG is normal sinus rhythm without acute findings, troponin  requested -WBC 3.2, hemoglobin 12.3 platelets 169 -Sodium 137, potassium 2.9, magnesium 2.2, creatinine 1.1, LFTs are not elevated  Review of Systems: As mentioned in the history of present illness. All other systems reviewed and are negative. 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   Substance induced mood disorder (HCC) 11/05/2016   Past Surgical History:  Procedure Laterality Date   ANEURYSM COILING     AGE 12 x2   APPENDECTOMY     BIOPSY  01/05/2017   Procedure: BIOPSY;  Surgeon: West Bali, MD;  Location: [facility];  Service: Endoscopy;;  Duodenal and Gastric   COLONOSCOPY WITH PROPOFOL N/A 01/05/2017   Procedure: COLONOSCOPY WITH PROPOFOL;  Surgeon: West Bali, MD;  Location: [facility];  Service: Endoscopy;  Laterality: N/A;  10:00am   DIRECT LARYNGOSCOPY Left 03/16/2023   Procedure: Direct laryngoscopy with biopsy of supraglottic mass;  Surgeon: Newman Pies, MD;  Location: [facility];  Service: ENT;  Laterality: Left;   ESOPHAGOGASTRODUODENOSCOPY (EGD) WITH PROPOFOL N/A 01/05/2017   Procedure: ESOPHAGOGASTRODUODENOSCOPY (EGD) WITH PROPOFOL;  Surgeon: West Bali, MD;  Location: [facility];  Service: Endoscopy;  Laterality: N/A;   IR GASTROSTOMY TUBE MOD SED  04/28/2023   IR IMAGING GUIDED PORT INSERTION  04/28/2023   IR PATIENT EVAL TECH  0-60 MINS  05/03/2023   LAPAROSCOPIC APPENDECTOMY     MASS EXCISION Right 01/13/2017   Procedure: EXCISION LIPOMA RIGHT ARM;  Surgeon: Lucretia Roers, MD;  Location: [facility];  Service: General;  Laterality: Right;   Social History:  reports that he has quit smoking. His smoking use included cigarettes. He has a  6.3 pack-year smoking history. He has never used smokeless tobacco. He reports current alcohol use of about 1.0 standard drink of alcohol per week. He reports that he does not currently use drugs after having used the following drugs: "Crack" cocaine, Cocaine, and Marijuana.  No Known Allergies  Family History  Problem Relation Age of Onset   Cancer Mother        Lung   Stomach cancer Mother    Cancer Sister        Bone cancer   Bone cancer Sister 17   Cancer Brother        3 brothers with prostate cancer   Colon cancer Brother 78   Colon polyps Neg Hx     Prior to Admission medications   Medication Sig Start Date End Date Taking? Authorizing Provider  acetaminophen (TYLENOL) 500 MG tablet Take 1,000 mg by mouth every 6 (six) hours as needed for mild pain (pain score 1-3) or moderate pain (pain score 4-6).   Yes [provider]  amlodipine-olmesartan (AZOR) 10-20 MG tablet TAKE 1 TABLET BY MOUTH DAILY 05/10/23  Yes [name], MD  dexamethasone (DECADRON) 4 MG tablet Take 2 tablets (8 mg) by mouth daily x 3 days starting the day after cisplatin chemotherapy. Take with food. 04/14/23  Yes Pasam, Avinash, MD  famotidine (PEPCID) 40 MG tablet Take 1 tablet (40 mg total) by mouth daily. 02/04/23  Yes [name], MD  FLUoxetine (PROZAC) 20 MG capsule Take 1 capsule (20 mg total) by mouth daily. 03/29/23  Yes [name], MD  hydrOXYzine (ATARAX) 25 MG tablet Take 1 tablet (25 mg total) by mouth 3 (three) times daily as needed for anxiety. Patient taking differently: Take 25 mg by mouth at bedtime. 02/04/23  Yes [name], MD  lidocaine (XYLOCAINE) 2 % solution Patient: Mix 1part 2% viscous lidocaine, 1part H20. Swallow 10mL of diluted mixture, before meals and at bedtime, up to QID 03/26/23  Yes Lonie Peak, MD  lidocaine-prilocaine (EMLA) cream Apply to affected area once 04/14/23  Yes Pasam, Avinash, MD  metoprolol succinate (TOPROL-XL) 25 MG 24 hr tablet  TAKE 1 TABLET(25 MG) BY MOUTH DAILY 05/10/23  Yes [name], MD  Nutritional Supplements (FEEDING SUPPLEMENT, OSMOLITE 1.5 CAL,) LIQD Place 60 mL/hr into feeding tube continuous. 7 Cartons of Osmolite 1.5 per day + an additional free water from flushes For long-term Patient taking differently: Place 60 mL/hr into feeding tube continuous. 4 Cartons of Osmolite 1.5 per day + an additional free water from flushes For long-term 05/07/23  Yes Tobey Grim, MD  ondansetron (ZOFRAN) 8 MG tablet Take 1 tablet (8 mg total) by mouth every 8 (eight) hours as needed for nausea or vomiting. Start on the third day after cisplatin. 04/14/23  Yes Pasam, Avinash, MD  prochlorperazine (COMPAZINE) 10 MG tablet TAKE 1 TABLET(10 MG) BY MOUTH EVERY 6 HOURS AS NEEDED FOR NAUSEA OR VOMITING 04/29/23  Yes Pasam, Avinash, MD  QUEtiapine (SEROQUEL) 50 MG tablet Take one pill nightly for 1 week. Then increase to 2 pills nightly for 1 week. Then increase to 3 pills  nightly for one week. Then increase to 4 pills nightly for one week. Patient taking differently: Take 50 mg by mouth at bedtime. Take one pill nightly for 1 week. Then increase to 2 pills nightly for 1 week. Then increase to 3 pills nightly for one week. Then increase to 4 pills nightly for one week. 04/05/23  Yes Elsie Lincoln, MD  nicotine (NICODERM CQ - DOSED IN MG/24 HOURS) 21 mg/24hr patch Place 1 patch (21 mg total) onto the skin daily. Apply 21 mg patch daily x 6 wk, then 14mg  patch daily x 2 wk, then 7 mg patch daily x 2 wk Patient not taking: Reported on 05/21/2023 03/26/23   Lonie Peak, MD  Protein (FEEDING SUPPLEMENT, PROSOURCE TF20,) liquid Place 60 mLs into feeding tube daily. Patient not taking: Reported on 05/21/2023 05/08/23   Tobey Grim, MD  QUEtiapine (SEROQUEL) 200 MG tablet Take 1 tablet (200 mg total) by mouth at bedtime. Patient not taking: Reported on 05/21/2023 05/02/23   Elsie Lincoln, MD  scopolamine  (TRANSDERM-SCOP) 1 MG/3DAYS Place 1 patch (1.5 mg total) onto the skin every 3 (three) days. Patient not taking: Reported on 05/21/2023 05/03/23   Lonie Peak, MD    Physical Exam: Vitals:   05/21/23 0615 05/21/23 0900 05/21/23 1323 05/21/23 1648  BP: 100/61 121/78 130/76 139/81  Pulse:  62 76 77  Resp: 11 12 15 20   Temp:   98.4 F (36.9 C) (!) 101.1 F (38.4 C)  TempSrc:   Oral Oral  SpO2:  92% 94% 95%  Weight:    63 kg  Height:    5\' 4"  (1.626 m)    Physical Exam Gen:- Awake Alert, in no acute distress  HEENT:- [place].AT, No sclera icterus Neck-Supple Neck,No JVD, radiation injury to skin of neck area Lungs-  CTAB , fair air movement bilaterally  CV- S1, S2 normal, RRR, right-sided Port-A-Cath in situ--does not appear infected Abd-  +ve B.Sounds, Abd Soft, No tenderness, PEG tube site is clean dry and intact Extremity/Skin:- No  edema,   good pedal pulses  Psych-affect is appropriate, oriented x3 Neuro-generalized weakness, no new focal deficits, no tremors  Data Reviewed: In the ED COVID, flu and RSV negative -Chest x-ray without acute cardiopulmonary findings -LE Extremity venous Dopplers without DVT -CTA chest without PE, emphysema noted, bronchitis noted -Carotid artery Dopplers with incidental finding of--Incidental note is made of acute occlusive thrombus throughout the right internal jugular vein. -No hemodynamically significant carotid artery stenosis -Lactic acid 3.7 repeat 2.3--- IV fluids ordered -UA not suggestive of UTI, blood cultures requested -A.m. cortisol is 4.7--- ACTH stimulation test ordered -Lipase WNL -EKG is normal sinus rhythm without acute findings, troponin requested -WBC 3.2, hemoglobin 12.3 platelets 169 -Sodium 137, potassium 2.9, magnesium 2.2, creatinine 1.1, LFTs are not elevated  Assessment and Plan: 1)Syncope- admit to telemetry monitored unit, watch for arrhythmias, EKG is normal sinus rhythm without acute findings, troponin  requested -check echocardiogram to rule out significant aortic stenosis or other outflow obstruction, and also to evaluate EF and to rule out segmental/Regional wall motion abnormalities.  -Carotid artery Dopplers with incidental finding of--Incidental note is made of acute occlusive thrombus throughout the right internal jugular vein. -No hemodynamically significant carotid artery stenosis  2)Acute DVT----Carotid artery Dopplers with incidental finding of--Incidental note is made of acute occlusive thrombus throughout the right internal jugular vein----in the setting of malignancy and right-sided Port-A-Cath in situ -No hemodynamically significant carotid artery stenosis -- Start IV heparin -Echo  pending  3)Febrile illness----Tmax 101.1---query if due to inflammation/acute DVT , rather than infection -COVID, flu and RSV negative -Chest x-ray without acute cardiopulmonary findings --CTA chest without PE, emphysema noted, bronchitis noted --Lactic acid 3.7 repeat 2.3--- IV fluids ordered -UA not suggestive of UTI, blood cultures requested --WBC 3.2,  -Hold off on antibiotics -Blood cultures pending -Echo pending  4)A.m. cortisol is 4.7--- ACTH stimulation test ordered --Patient presented with syncope and hypotension  5)Hypokalemia--- potassium 2.9, magnesium 2.2,  -- Replace potassium  6)Squamous cell carcinoma of the supraglottics (stage III (cT3, cN1, cM0)) in January 2025  --started radiation on 04/14/2023, chemotherapy with cisplatin on 04/19/2023,  He gets radiation Monday through Friday and takes chemo on Mondays. Last seen by primary oncologist--Dr. [name] on 05/14/23  7) dysphagia/odynophagia--- due to supraglottic squamous cell carcinoma and subsequent radiotherapy--- --dietitian consult for adjustment of PEG tube feeding appreciated -Patient has had poor tolerance to bolus feeding dietitian recommends continues to fluid with Osmolite 1.5, with plans to transition to  bolus feeding later if patient tolerates continuous feeding at goal rate    Advance Care Planning:   Code Status: Full Code   Consults: radiology  Family Communication: None at bedside  Severity of Illness: The appropriate patient status for this patient is INPATIENT. Inpatient status is judged to be reasonable and necessary in order to provide the required intensity of service to ensure the patient's safety. The patient's presenting symptoms, physical exam findings, and initial radiographic and laboratory data in the context of their chronic comorbidities is felt to place them at high risk for further clinical deterioration. Furthermore, it is not anticipated that the patient will be medically stable for discharge from the hospital within 2 midnights of admission.   * I certify that at the point of admission it is my clinical judgment that the patient will require inpatient hospital care spanning beyond 2 midnights from the point of admission due to high intensity of service, high risk for further deterioration and high frequency of surveillance required.*  Author: Shon Hale, MD 05/21/2023 5:03 PM  For on call review [email or web address].

## 2023-05-21 NOTE — Progress Notes (Signed)
 PHARMACY - ANTICOAGULATION Pharmacy Consult for Heparin Indication: DVT Brief A/P: Heparin level within goal range Continue Heparin at current rate   No Known Allergies  Patient Measurements: Height: 5\' 4"  (162.6 cm) Weight: 63 kg (139 lb) IBW/kg (Calculated) : 59.2 HEPARIN DW (KG): 63   Vital Signs: Temp: 100.4 F (38 C) (02/21 2029) Temp Source: Oral (02/21 2029) BP: 117/69 (02/21 2029) Pulse Rate: 80 (02/21 2029)  Labs: Recent Labs    05/21/23 0148 05/21/23 1734 05/21/23 2100  HGB 12.3*  --   --   HCT 36.9*  --   --   PLT 169  --   --   HEPARINUNFRC  --   --  0.66  CREATININE 1.14  --   --   TROPONINIHS  --  <2  --     Estimated Creatinine Clearance: 62 mL/min (by C-G formula based on SCr of 1.14 mg/dL).  Assessment: 53 y.o. male with DVT for heparin  Goal of Therapy:  Heparin level 0.3-0.7 units/ml Monitor platelets by anticoagulation protocol: Yes   Plan:  No change to heparin  Follow-up am labs.  Geannie Risen, PharmD, BCPS 05/21/2023,11:22 PM

## 2023-05-21 NOTE — Progress Notes (Signed)
 This Investment banker, operational was transferring patient from ED to 300 unit. On monitor patient had two episodes of bradycardia down to a low of 33. EKG completed once gotten to floor (SR in the 60s) & placed patient on telemetry. Patient symptomatic.

## 2023-05-21 NOTE — ED Provider Notes (Signed)
 [name] Provider Note   CSN: [id number] Arrival date & time: 05/21/23  0049     History  Chief Complaint  Patient presents with   Loss of Consciousness    [name] is a 53 y.o. male.  This patient is a 53 year old male with past medical history of throat cancer currently undergoing chemotherapy and radiation.  Patient brought by EMS after a syncopal episode.  This evening, he seems somewhat disoriented, then began speaking with someone who was not there.  He attempted to ambulate, then had a syncopal episode where he lost consciousness briefly.  He was then brought here by EMS.  He has a feeding tube and gets most of his nourishment through this tube.  He has had decreased p.o. intake due to pain from swallowing and inflammation from radiation therapy.  No fevers or chills.  No diarrhea or vomiting.  The history is provided by the patient.       Home Medications Prior to Admission medications   Medication Sig Start Date End Date Taking? Authorizing Provider  acetaminophen (TYLENOL) 500 MG tablet Take 1,000 mg by mouth every 6 (six) hours as needed for mild pain (pain score 1-3) or moderate pain (pain score 4-6).    [provider]  amlodipine-olmesartan (AZOR) 10-20 MG tablet TAKE 1 TABLET BY MOUTH DAILY 05/10/23   [name], MD  dexamethasone (DECADRON) 4 MG tablet Take 2 tablets (8 mg) by mouth daily x 3 days starting the day after cisplatin chemotherapy. Take with food. 04/14/23   Pasam, Archie Patten, MD  famotidine (PEPCID) 40 MG tablet Take 1 tablet (40 mg total) by mouth daily. 02/04/23   [name], MD  FLUoxetine (PROZAC) 20 MG capsule Take 1 capsule (20 mg total) by mouth daily. 03/29/23   [name], MD  fluticasone (FLONASE) 50 MCG/ACT nasal spray Place 1 spray into both nostrils 2 (two) times daily. 10/28/22   Particia Nearing, PA-C  hydrOXYzine (ATARAX) 25 MG tablet Take 1 tablet (25 mg total)  by mouth 3 (three) times daily as needed for anxiety. 02/04/23   [name], MD  lidocaine (XYLOCAINE) 2 % solution Patient: Mix 1part 2% viscous lidocaine, 1part H20. Swallow 10mL of diluted mixture, before meals and at bedtime, up to QID 03/26/23   Lonie Peak, MD  lidocaine-prilocaine (EMLA) cream Apply to affected area once 04/14/23   Pasam, Avinash, MD  metoprolol succinate (TOPROL-XL) 25 MG 24 hr tablet TAKE 1 TABLET(25 MG) BY MOUTH DAILY 05/10/23   [name], MD  nicotine (NICODERM CQ - DOSED IN MG/24 HOURS) 14 mg/24hr patch Place 1 patch (14 mg total) onto the skin daily. Apply 21 mg patch daily x 6 wk, then 14mg  patch daily x 2 wk, then 7 mg patch daily x 2 wk 03/26/23   Lonie Peak, MD  nicotine (NICODERM CQ - DOSED IN MG/24 HOURS) 21 mg/24hr patch Place 1 patch (21 mg total) onto the skin daily. Apply 21 mg patch daily x 6 wk, then 14mg  patch daily x 2 wk, then 7 mg patch daily x 2 wk 03/26/23   Lonie Peak, MD  nicotine (NICODERM CQ - DOSED IN MG/24 HR) 7 mg/24hr patch Place 1 patch (7 mg total) onto the skin daily. Apply 21 mg patch daily x 6 wk, then 14mg  patch daily x 2 wk, then 7 mg patch daily x 2 wk 03/26/23   Lonie Peak, MD  Nutritional  Supplements (FEEDING SUPPLEMENT, OSMOLITE 1.5 CAL,) LIQD Place 60 mL/hr into feeding tube continuous. 7 Cartons of Osmolite 1.5 per day + an additional free water from flushes For long-term 05/07/23   Tobey Grim, MD  ondansetron (ZOFRAN) 8 MG tablet Take 1 tablet (8 mg total) by mouth every 8 (eight) hours as needed for nausea or vomiting. Start on the third day after cisplatin. 04/14/23   Pasam, Archie Patten, MD  prochlorperazine (COMPAZINE) 10 MG tablet TAKE 1 TABLET(10 MG) BY MOUTH EVERY 6 HOURS AS NEEDED FOR NAUSEA OR VOMITING 04/29/23   Pasam, Avinash, MD  Protein (FEEDING SUPPLEMENT, PROSOURCE TF20,) liquid Place 60 mLs into feeding tube daily. 05/08/23   Tobey Grim, MD  QUEtiapine (SEROQUEL) 200 MG tablet Take 1  tablet (200 mg total) by mouth at bedtime. 05/02/23   Elsie Lincoln, MD  QUEtiapine (SEROQUEL) 50 MG tablet Take one pill nightly for 1 week. Then increase to 2 pills nightly for 1 week. Then increase to 3 pills nightly for one week. Then increase to 4 pills nightly for one week. 04/05/23   Elsie Lincoln, MD  scopolamine (TRANSDERM-SCOP) 1 MG/3DAYS Place 1 patch (1.5 mg total) onto the skin every 3 (three) days. 05/03/23   Lonie Peak, MD      Allergies    Patient has no known allergies.    Review of Systems   Review of Systems  All other systems reviewed and are negative.   Physical Exam Updated Vital Signs BP (!) 79/48   Pulse (!) 58   Temp 98.3 F (36.8 C) (Oral)   Resp 18   Ht 5\' 4"  (1.626 m)   Wt 68 kg   SpO2 93%   BMI 25.75 kg/m  Physical Exam Vitals and nursing note reviewed.  Constitutional:      General: He is not in acute distress.    Appearance: He is well-developed. He is not diaphoretic.  HENT:     Head: Normocephalic and atraumatic.  Cardiovascular:     Rate and Rhythm: Normal rate and regular rhythm.     Heart sounds: No murmur heard.    No friction rub.  Pulmonary:     Effort: Pulmonary effort is normal. No respiratory distress.     Breath sounds: Normal breath sounds. No wheezing or rales.  Abdominal:     General: Bowel sounds are normal. There is no distension.     Palpations: Abdomen is soft.     Tenderness: There is no abdominal tenderness.  Musculoskeletal:        General: Normal range of motion.     Cervical back: Normal range of motion and neck supple.  Skin:    General: Skin is warm and dry.     Comments: There are radiation burns noted to his neck.  Neurological:     Mental Status: He is alert and oriented to person, place, and time.     Coordination: Coordination normal.     ED Results / Procedures / Treatments   Labs (all labs ordered are listed, but only abnormal results are displayed) Labs Reviewed  CBG MONITORING, ED -  Abnormal; Notable for the following components:      Result Value   Glucose-Capillary 113 (*)    All other components within normal limits    EKG ED ECG REPORT   Date: 05/21/2023  Rate: 78  Rhythm: normal sinus rhythm  QRS Axis: normal  Intervals: normal  ST/T Wave abnormalities: normal  Conduction Disutrbances:none  Narrative Interpretation:   Old EKG Reviewed: unchanged  I have personally reviewed the EKG tracing and agree with the computerized printout as noted.   Radiology No results found.  Procedures Procedures    Medications Ordered in ED Medications  sodium chloride 0.9 % bolus 1,000 mL (1,000 mLs Intravenous New Bag/Given 05/21/23 0127)    ED Course/ Medical Decision Making/ A&P  Patient is a 52 year old male with history of throat cancer presenting for evaluation of syncope, the details of which are described in the HPI.  Patient arrives here hypotensive, but with vital signs that are otherwise stable.  There is no hypoxia.  Physical examination basically unremarkable.  Laboratory studies obtained including CBC, CMP, and lipase, all of which are basically unremarkable.  Initial lactate was 3.7, then upon repeat after fluids was 2.3.  Blood cultures obtained and are pending.  Urinalysis inconsistent with infection.  COVID/flu/RSV all negative.  Chest x-ray obtained showing no acute process.  Patient has received IV fluids due to his initial hypotension and blood pressures after 2 L are now much improved.  I am uncertain as to the etiology of the hypotension, but suspect possibly dehydration.  Patient is supposed to be doing supplements through his feeding tube, however it sounds as though he has not been doing this.  Also considered is sepsis, but without fever or white count have decided to observe.  Patient to be admitted to the hospitalist service for further monitoring and care.  CRITICAL CARE Performed by: Geoffery Lyons Total critical care time: 35  minutes Critical care time was exclusive of separately billable procedures and treating other patients. Critical care was necessary to treat or prevent imminent or life-threatening deterioration. Critical care was time spent personally by me on the following activities: development of treatment plan with patient and/or surrogate as well as nursing, discussions with consultants, evaluation of patient's response to treatment, examination of patient, obtaining history from patient or surrogate, ordering and performing treatments and interventions, ordering and review of laboratory studies, ordering and review of radiographic studies, pulse oximetry and re-evaluation of patient's condition.   Final Clinical Impression(s) / ED Diagnoses Final diagnoses:  None    Rx / DC Orders ED Discharge Orders     None         Geoffery Lyons, MD 05/21/23 [phone]

## 2023-05-21 NOTE — ED Notes (Addendum)
 ED TO INPATIENT HANDOFF REPORT  ED Nurse Name and Phone #: Beverley Fiedler Name/Age/Gender [name] 53 y.o. male Room/Bed: APA02/APA02  Code Status   Code Status: Full Code  Home/SNF/Other Home Patient oriented to: self, place, time, and situation Is this baseline? Yes   Triage Complete: Triage complete  Chief Complaint Syncope [R55]  Triage Note BIB [facility] EMS after a syncopal episode at home. Family told EMS that he was talking to someone who wasn't there. Patient A&Ox4 with EMS. Recent diagnosis of RSV. C/o sore throat, a little shortness of breath, feeling weak and tired. EMS placed patient on 2 liter of O2.  Is currently under treatment for stage 3 throat cancer. States he does radiation Monday through Friday and takes chemo on Mondays.   Allergies No Known Allergies  Level of Care/Admitting Diagnosis ED Disposition     ED Disposition  Admit   Condition  --   Comment  Hospital Area: [facility] [[id number]]  Level of Care: Telemetry [5]  Covid Evaluation: Asymptomatic - no recent exposure (last 10 days) testing not required  Diagnosis: Syncope [206001]  Admitting Physician: [name] [[id number]]  Attending Physician: [name] [[id number]]  Certification:: I certify this patient will need inpatient services for at least 2 midnights  Expected Medical Readiness: 05/24/2023          B Medical/Surgery History Past Medical History:  Diagnosis Date   Anxiety    Bipolar 1 disorder (HCC)    EtOH dependence (HCC)    QUIT   GERD without esophagitis    Hepatitis C carrier (HCC)    Hypercholesteremia    Hypertension    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   Substance induced mood disorder (HCC) 11/05/2016   Past Surgical History:  Procedure Laterality Date   ANEURYSM COILING     AGE 79 x2   APPENDECTOMY     BIOPSY  01/05/2017    Procedure: BIOPSY;  Surgeon: West Bali, MD;  Location: [facility];  Service: Endoscopy;;  Duodenal and Gastric   COLONOSCOPY WITH PROPOFOL N/A 01/05/2017   Procedure: COLONOSCOPY WITH PROPOFOL;  Surgeon: West Bali, MD;  Location: [facility];  Service: Endoscopy;  Laterality: N/A;  10:00am   DIRECT LARYNGOSCOPY Left 03/16/2023   Procedure: Direct laryngoscopy with biopsy of supraglottic mass;  Surgeon: Newman Pies, MD;  Location:  SURGERY CENTER;  Service: ENT;  Laterality: Left;   ESOPHAGOGASTRODUODENOSCOPY (EGD) WITH PROPOFOL N/A 01/05/2017   Procedure: ESOPHAGOGASTRODUODENOSCOPY (EGD) WITH PROPOFOL;  Surgeon: West Bali, MD;  Location: [facility];  Service: Endoscopy;  Laterality: N/A;   IR GASTROSTOMY TUBE MOD SED  04/28/2023   IR IMAGING GUIDED PORT INSERTION  04/28/2023   IR PATIENT EVAL TECH 0-60 MINS  05/03/2023   LAPAROSCOPIC APPENDECTOMY     MASS EXCISION Right 01/13/2017   Procedure: EXCISION LIPOMA RIGHT ARM;  Surgeon: Lucretia Roers, MD;  Location: [facility] ORS;  Service: General;  Laterality: Right;     A IV Location/Drains/Wounds Patient Lines/Drains/Airways Status     Active Line/Drains/Airways     Name Placement date Placement time Site Days   Implanted Port 04/28/23 Right Chest Single 04/28/23  1013  Chest  23   Gastrostomy/Enterostomy Percutaneous endoscopic gastrostomy (PEG) 20 Fr. LUQ 04/28/23  1035  LUQ  23  Intake/Output Last 24 hours  Intake/Output Summary (Last 24 hours) at 05/21/2023 1621 Last data filed at 05/21/2023 1610 Gross per 24 hour  Intake 2100.16 ml  Output --  Net 2100.16 ml    Labs/Imaging Results for orders placed or performed during the hospital encounter of 05/21/23 (from the past 48 hours)  CBG monitoring, ED     Status: Abnormal   Collection Time: 05/21/23 12:52 AM  Result Value Ref Range   Glucose-Capillary 113 (H) 70 - 99 mg/dL    Comment: Glucose reference range applies only to samples  taken after fasting for at least 8 hours.  Urinalysis, w/ Reflex to Culture (Infection Suspected) -Urine, Clean Catch     Status: Abnormal   Collection Time: 05/21/23  1:37 AM  Result Value Ref Range   Specimen Source URINE, CLEAN CATCH    Color, Urine STRAW (A) YELLOW   APPearance CLEAR CLEAR   Specific Gravity, Urine 1.004 (L) 1.005 - 1.030   pH 6.0 5.0 - 8.0   Glucose, UA NEGATIVE NEGATIVE mg/dL   Hgb urine dipstick NEGATIVE NEGATIVE   Bilirubin Urine NEGATIVE NEGATIVE   Ketones, ur NEGATIVE NEGATIVE mg/dL   Protein, ur NEGATIVE NEGATIVE mg/dL   Nitrite NEGATIVE NEGATIVE   Leukocytes,Ua NEGATIVE NEGATIVE   RBC / HPF 0-5 0 - 5 RBC/hpf   WBC, UA 0-5 0 - 5 WBC/hpf    Comment:        Reflex urine culture not performed if WBC <=10, OR if Squamous epithelial cells >5. If Squamous epithelial cells >5 suggest recollection.    Bacteria, UA NONE SEEN NONE SEEN   Squamous Epithelial / HPF 0-5 0 - 5 /HPF    Comment: Performed at [facility], [address] [place], [place] [address]  Comprehensive metabolic panel     Status: Abnormal   Collection Time: 05/21/23  1:48 AM  Result Value Ref Range   Sodium 137 135 - 145 mmol/L   Potassium 2.9 (L) 3.5 - 5.1 mmol/L   Chloride 101 98 - 111 mmol/L   CO2 22 22 - 32 mmol/L   Glucose, Bld 104 (H) 70 - 99 mg/dL    Comment: Glucose reference range applies only to samples taken after fasting for at least 8 hours.   BUN 13 6 - 20 mg/dL   Creatinine, Ser 4.09 0.61 - 1.24 mg/dL   Calcium 8.9 8.9 - 81.1 mg/dL   Total Protein 7.2 6.5 - 8.1 g/dL   Albumin 3.4 (L) 3.5 - 5.0 g/dL   AST 14 (L) 15 - 41 U/L   ALT 14 0 - 44 U/L   Alkaline Phosphatase 44 38 - 126 U/L   Total Bilirubin 0.6 0.0 - 1.2 mg/dL   GFR, Estimated >91 >47 mL/min    Comment: (NOTE) Calculated using the CKD-EPI Creatinine Equation (2021)    Anion gap 14 5 - 15    Comment: Performed at [facility], [address] [place], [place] [address]  CBC with Differential     Status:  Abnormal   Collection Time: 05/21/23  1:48 AM  Result Value Ref Range   WBC 3.2 (L) 4.0 - 10.5 K/uL   RBC 4.12 (L) 4.22 - 5.81 MIL/uL   Hemoglobin 12.3 (L) 13.0 - 17.0 g/dL   HCT 21.3 (L) 08.6 - 57.8 %   MCV 89.6 80.0 - 100.0 fL   MCH 29.9 26.0 - 34.0 pg   MCHC 33.3 30.0 - 36.0 g/dL   RDW 46.9 62.9 - 52.8 %  Platelets 169 150 - 400 K/uL   nRBC 0.0 0.0 - 0.2 %   Neutrophils Relative % 63 %   Neutro Abs 2.0 1.7 - 7.7 K/uL   Lymphocytes Relative 19 %   Lymphs Abs 0.6 (L) 0.7 - 4.0 K/uL   Monocytes Relative 18 %   Monocytes Absolute 0.6 0.1 - 1.0 K/uL   Eosinophils Relative 0 %   Eosinophils Absolute 0.0 0.0 - 0.5 K/uL   Basophils Relative 0 %   Basophils Absolute 0.0 0.0 - 0.1 K/uL   Immature Granulocytes 0 %   Abs Immature Granulocytes 0.01 0.00 - 0.07 K/uL    Comment: Performed at [facility], [address] [place], [place] [address]  Lactic acid, plasma     Status: Abnormal   Collection Time: 05/21/23  1:48 AM  Result Value Ref Range   Lactic Acid, Venous 3.7 (HH) 0.5 - 1.9 mmol/L    Comment: CRITICAL RESULT CALLED TO, READ BACK BY AND VERIFIED WITH Carrington Clamp [id number], BJYNW,G Performed at [facility], [address], [place], [place] [address]   Lipase, blood     Status: None   Collection Time: 05/21/23  1:48 AM  Result Value Ref Range   Lipase 44 11 - 51 U/L    Comment: Performed at [facility], [address], [place], [place] [address]  Resp panel by RT-PCR (RSV, Flu A&B, Covid) Anterior Nasal Swab     Status: None   Collection Time: 05/21/23  1:48 AM   Specimen: Anterior Nasal Swab  Result Value Ref Range   SARS Coronavirus 2 by RT PCR NEGATIVE NEGATIVE    Comment: (NOTE) SARS-CoV-2 target nucleic acids are NOT DETECTED.  The SARS-CoV-2 RNA is generally detectable in upper respiratory specimens during the acute phase of infection. The lowest concentration of SARS-CoV-2 viral copies this assay can detect is 138 copies/mL. A negative result does not  preclude SARS-Cov-2 infection and should not be used as the sole basis for treatment or other patient management decisions. A negative result may occur with  improper specimen collection/handling, submission of specimen other than nasopharyngeal swab, presence of viral mutation(s) within the areas targeted by this assay, and inadequate number of viral copies(<138 copies/mL). A negative result must be combined with clinical observations, patient history, and epidemiological information. The expected result is Negative.  Fact Sheet for Patients:  BloggerCourse.com  Fact Sheet for Healthcare Providers:  SeriousBroker.it  This test is no t yet approved or cleared by the [place] FDA and  has been authorized for detection and/or diagnosis of SARS-CoV-2 by FDA under an Emergency Use Authorization (EUA). This EUA will remain  in effect (meaning this test can be used) for the duration of the COVID-19 declaration under Section 564(b)(1) of the Act, 21 U.S.C.section 360bbb-3(b)(1), unless the authorization is terminated  or revoked sooner.       Influenza A by PCR NEGATIVE NEGATIVE   Influenza B by PCR NEGATIVE NEGATIVE    Comment: (NOTE) The Xpert Xpress SARS-CoV-2/FLU/RSV plus assay is intended as an aid in the diagnosis of influenza from Nasopharyngeal swab specimens and should not be used as a sole basis for treatment. Nasal washings and aspirates are unacceptable for Xpert Xpress SARS-CoV-2/FLU/RSV testing.  Fact Sheet for Patients: BloggerCourse.com  Fact Sheet for Healthcare Providers: SeriousBroker.it  This test is not yet approved or cleared by the [place] FDA and has been authorized for detection and/or diagnosis of SARS-CoV-2 by FDA under an Emergency Use Authorization (EUA).  This EUA will remain in effect (meaning this test can be used) for the duration of  the COVID-19 declaration under Section 564(b)(1) of the Act, 21 U.S.C. section 360bbb-3(b)(1), unless the authorization is terminated or revoked.     Resp Syncytial Virus by PCR NEGATIVE NEGATIVE    Comment: (NOTE) Fact Sheet for Patients: BloggerCourse.com  Fact Sheet for Healthcare Providers: SeriousBroker.it  This test is not yet approved or cleared by the [place] FDA and has been authorized for detection and/or diagnosis of SARS-CoV-2 by FDA under an Emergency Use Authorization (EUA). This EUA will remain in effect (meaning this test can be used) for the duration of the COVID-19 declaration under Section 564(b)(1) of the Act, 21 U.S.C. section 360bbb-3(b)(1), unless the authorization is terminated or revoked.  Performed at [facility], [address] [place], [place] [address]   Culture, blood (routine x 2)     Status: None (Preliminary result)   Collection Time: 05/21/23  2:07 AM   Specimen: BLOOD  Result Value Ref Range   Specimen Description BLOOD    Special Requests Normal    Culture      NO GROWTH < 12 HOURS Performed at [facility], [address], [place], [place] [address]    Report Status PENDING   Culture, blood (routine x 2)     Status: None (Preliminary result)   Collection Time: 05/21/23  2:07 AM   Specimen: BLOOD  Result Value Ref Range   Specimen Description BLOOD    Special Requests Normal    Culture      NO GROWTH < 12 HOURS Performed at [facility], [address], [place], [place] [address]    Report Status PENDING   Cortisol-am, blood     Status: Abnormal   Collection Time: 05/21/23  2:07 AM  Result Value Ref Range   Cortisol - AM 4.7 (L) 6.7 - 22.6 ug/dL    Comment: Performed at [facility], [address] [place], [place] [address]  Lactic acid, plasma     Status: Abnormal   Collection Time: 05/21/23  3:50 AM  Result Value Ref Range   Lactic Acid, Venous 2.3 (HH) 0.5 - 1.9  mmol/L    Comment: CRITICAL RESULT CALLED TO, READ BACK BY AND VERIFIED WITH FOWLER,D AT 4:45AM ON 05/21/23 BY [facility] Performed at [facility], [address], [place], [place] [address]   Magnesium     Status: None   Collection Time: 05/21/23  8:24 AM  Result Value Ref Range   Magnesium 2.2 1.7 - 2.4 mg/dL    Comment: Performed at [facility], [address], [place], [place] [address]   US Carotid Bilateral Result Date: 05/21/2023 CLINICAL DATA:  Syncope EXAM: BILATERAL CAROTID DUPLEX ULTRASOUND TECHNIQUE: Wallace Cullens scale imaging, color Doppler and duplex ultrasound were performed of bilateral carotid and vertebral arteries in the neck. COMPARISON:  None Available. FINDINGS: Criteria: Quantification of carotid stenosis is based on velocity parameters that correlate the residual internal carotid diameter with NASCET-based stenosis levels, using the diameter of the distal internal carotid lumen as the denominator for stenosis measurement. The following velocity measurements were obtained: RIGHT ICA: 114/35 cm/sec CCA: 118/16 cm/sec SYSTOLIC ICA/CCA RATIO:  1.3 ECA:  68 cm/sec LEFT ICA: 92/29 cm/sec CCA: 106/26 cm/sec SYSTOLIC ICA/CCA RATIO:  1.0 ECA:  84 cm/sec RIGHT CAROTID ARTERY: Mild heterogeneous atherosclerotic plaque in the proximal internal carotid artery. By peak systolic velocity criteria, the estimated stenosis is less than 50%. RIGHT VERTEBRAL ARTERY:  Patent with normal antegrade flow. LEFT CAROTID ARTERY: Mild heterogeneous atherosclerotic plaque in the proximal internal carotid artery. By peak systolic velocity criteria, the estimated stenosis is less than 50%. LEFT VERTEBRAL ARTERY:  Patent with normal antegrade flow. Other: Incidental note is made of thrombus within the right internal jugular vein. The vein is noncompressible in there is no evidence of color flow. Findings are consistent with acute occlusive thrombus. IMPRESSION: 1. Incidental note is made of acute occlusive thrombus  throughout the right internal jugular vein. 2. Mild (1-49%) stenosis proximal right internal carotid artery secondary to heterogenous atherosclerotic plaque. 3. Mild (1-49%) stenosis proximal left internal carotid artery secondary to heterogenous atherosclerotic plaque. 4. Vertebral arteries are patent with normal antegrade flow. Electronically Signed   By: Malachy Moan M.D.   On: 05/21/2023 13:55   CT Angio Chest Pulmonary Embolism (PE) W or WO Contrast Result Date: 05/21/2023 CLINICAL DATA:  History of laryngeal cancer with syncopal episode, shortness of breath, and weakness. Altered mental status EXAM: CT ANGIOGRAPHY CHEST WITH CONTRAST TECHNIQUE: Multidetector CT imaging of the chest was performed using the standard protocol during bolus administration of intravenous contrast. Multiplanar CT image reconstructions and MIPs were obtained to evaluate the vascular anatomy. RADIATION DOSE REDUCTION: This exam was performed according to the departmental dose-optimization program which includes automated exposure control, adjustment of the mA and/or kV according to patient size and/or use of iterative reconstruction technique. CONTRAST:  75mL OMNIPAQUE IOHEXOL 350 MG/ML SOLN COMPARISON:  Same day chest radiograph FINDINGS: Cardiovascular: Right chest wall port terminates in the right atrium. The study is high quality for the evaluation of pulmonary embolism. There are no filling defects in the central, lobar, segmental or subsegmental pulmonary artery branches to suggest acute pulmonary embolism. Great vessels are normal in course and caliber. Normal heart size. No significant pericardial fluid/thickening. Coronary artery calcifications and aortic atherosclerosis. Mediastinum/Nodes: Imaged thyroid gland without nodules meeting criteria for imaging follow-up by size. Normal esophagus. No pathologically enlarged axillary, supraclavicular, mediastinal, or hilar lymph nodes. Lungs/Pleura: The central airways are  patent. Layering secretions within the trachea extending into bilateral bronchi with left lower lobe subsegmental mucous plugging. Mild diffuse bronchial wall thickening. Minimal right apical paraseptal emphysema. Medial bilateral lower lobe tree-in-bud and ground-glass nodules. No pneumothorax. No pleural effusion. Upper abdomen: Partially imaged percutaneous gastrostomy tube in-situ. Musculoskeletal: No acute or abnormal lytic or blastic osseous lesions. Review of the MIP images confirms the above findings. IMPRESSION: 1. No evidence of pulmonary embolism. 2. Findings of bronchitis with medial bilateral lower lobe tree-in-bud and ground-glass nodules, likely infectious/inflammatory. 3. Aortic Atherosclerosis (ICD10-I70.0) and Emphysema (ICD10-J43.9). Coronary artery calcifications. Assessment for potential risk factor modification, dietary therapy or pharmacologic therapy may be warranted, if clinically indicated. Electronically Signed   By: Agustin Cree M.D.   On: 05/21/2023 12:01   US Venous Img Lower Bilateral (DVT) Result Date: 05/21/2023 CLINICAL DATA:  Pulmonary emboli (HCC) EXAM: BILATERAL LOWER EXTREMITY VENOUS DOPPLER ULTRASOUND TECHNIQUE: Gray-scale sonography with graded compression, as well as color Doppler and duplex ultrasound were performed to evaluate the lower extremity deep venous systems from the level of the common femoral vein and including the common femoral, femoral, profunda femoral, popliteal and calf veins including the posterior tibial, peroneal and gastrocnemius veins when visible. The superficial great saphenous vein was also interrogated. Spectral Doppler was utilized to evaluate flow at rest and with distal augmentation maneuvers in the common femoral, femoral and popliteal veins. COMPARISON:  None Available. FINDINGS: RIGHT LOWER EXTREMITY Common Femoral  Vein: No evidence of thrombus. Normal compressibility, respiratory phasicity and response to augmentation. Saphenofemoral  Junction: No evidence of thrombus. Normal compressibility and flow on color Doppler imaging. Profunda Femoral Vein: No evidence of thrombus. Normal compressibility and flow on color Doppler imaging. Femoral Vein: No evidence of thrombus. Normal compressibility, respiratory phasicity and response to augmentation. Popliteal Vein: No evidence of thrombus. Normal compressibility, respiratory phasicity and response to augmentation. Calf Veins: No evidence of thrombus. Normal compressibility and flow on color Doppler imaging. LEFT LOWER EXTREMITY Common Femoral Vein: No evidence of thrombus. Normal compressibility, respiratory phasicity and response to augmentation. Saphenofemoral Junction: No evidence of thrombus. Normal compressibility and flow on color Doppler imaging. Profunda Femoral Vein: No evidence of thrombus. Normal compressibility and flow on color Doppler imaging. Femoral Vein: No evidence of thrombus. Normal compressibility, respiratory phasicity and response to augmentation. Popliteal Vein: No evidence of thrombus. Normal compressibility, respiratory phasicity and response to augmentation. Calf Veins: No evidence of thrombus. Normal compressibility and flow on color Doppler imaging. IMPRESSION: No evidence of deep venous thrombosis in either lower extremity. Electronically Signed   By: Feliberto Harts M.D.   On: 05/21/2023 10:42   DG Chest Port 1 View Result Date: 05/21/2023 CLINICAL DATA:  weakness EXAM: PORTABLE CHEST 1 VIEW COMPARISON:  Chest x-ray 05/03/2023 FINDINGS: The heart and mediastinal contours are unchanged. Right chest wall accessed Port-A-Cath with tip overlying the expected region the superior caval junction. Atherosclerotic plaque. No focal consolidation. No pulmonary edema. No pleural effusion. No pneumothorax. No acute osseous abnormality. IMPRESSION: 1. No active disease. 2.  Aortic Atherosclerosis (ICD10-I70.0). Electronically Signed   By: Tish Frederickson M.D.   On: 05/21/2023 01:59     Pending Labs Unresulted Labs (From admission, onward)     Start     Ordered   05/23/23 0500  CBC  Daily,   R      05/21/23 1416   05/22/23 0500  Comprehensive metabolic panel  Tomorrow morning,   R        05/21/23 0822   05/22/23 0500  CBC  Tomorrow morning,   R        05/21/23 0822   05/22/23 0500  Heparin level (unfractionated)  Daily,   R      05/21/23 1416   05/21/23 2100  Heparin level (unfractionated)  ONCE - URGENT,   URGENT        05/21/23 1416   05/21/23 0137  Urine Culture  Once,   URGENT       Question Answer Comment  Indication Altered mental status (if no other cause identified)   Patient immune status Normal      05/21/23 0137            Vitals/Pain Today's Vitals   05/21/23 0615 05/21/23 0900 05/21/23 0912 05/21/23 1323  BP: 100/61 121/78  130/76  Pulse:  62  76  Resp: 11 12  15   Temp:    98.4 F (36.9 C)  TempSrc:    Oral  SpO2:  92%  94%  Weight:      Height:      PainSc:   7  7     Isolation Precautions No active isolations  Medications Medications  metoprolol succinate (TOPROL-XL) 24 hr tablet 25 mg (has no administration in time range)  FLUoxetine (PROZAC) capsule 20 mg (20 mg Per Tube Given 05/21/23 1416)  hydrOXYzine (ATARAX) tablet 25 mg (has no administration in time range)  QUEtiapine (SEROQUEL) tablet 50 mg (has no  administration in time range)  famotidine (PEPCID) tablet 40 mg (40 mg Per Tube Given 05/21/23 1422)  sodium chloride flush (NS) 0.9 % injection 3 mL (has no administration in time range)  sodium chloride flush (NS) 0.9 % injection 3 mL (3 mLs Intravenous Given 05/21/23 1325)  sodium chloride flush (NS) 0.9 % injection 3 mL (has no administration in time range)  0.9 %  sodium chloride infusion (has no administration in time range)  acetaminophen (TYLENOL) tablet 650 mg (has no administration in time range)    Or  acetaminophen (TYLENOL) suppository 650 mg (has no administration in time range)  polyethylene glycol  (MIRALAX / GLYCOLAX) packet 17 g (has no administration in time range)  bisacodyl (DULCOLAX) suppository 10 mg (has no administration in time range)  ondansetron (ZOFRAN) tablet 4 mg (has no administration in time range)    Or  ondansetron (ZOFRAN) injection 4 mg (has no administration in time range)  0.9 %  sodium chloride infusion ( Intravenous New Bag/Given 05/21/23 0909)  feeding supplement (OSMOLITE 1.5 CAL) liquid 1,000 mL (has no administration in time range)  free water 200 mL (200 mLs Per Tube Given 05/21/23 1433)  heparin bolus via infusion 4,000 Units (4,000 Units Intravenous Bolus from Bag 05/21/23 1607)    Followed by  heparin ADULT infusion 100 units/mL (25000 units/261mL) (1,100 Units/hr Intravenous New Bag/Given 05/21/23 1606)  sodium chloride 0.9 % bolus 1,000 mL (0 mLs Intravenous Stopped 05/21/23 0242)  sodium chloride 0.9 % bolus 1,000 mL (0 mLs Intravenous Stopped 05/21/23 0342)  potassium chloride 10 mEq in 100 mL IVPB (0 mEq Intravenous Stopped 05/21/23 0649)  potassium chloride (KLOR-CON) packet 40 mEq (40 mEq Per Tube Given 05/21/23 1421)  iohexol (OMNIPAQUE) 350 MG/ML injection 75 mL (75 mLs Intravenous Contrast Given 05/21/23 1051)    Mobility walks       R Recommendations: See Admitting Provider Note  Report given to: Hospital doctor

## 2023-05-21 NOTE — ED Triage Notes (Addendum)
 BIB [facility] EMS after a syncopal episode at home. Family told EMS that he was talking to someone who wasn't there. Patient A&Ox4 with EMS. Recent diagnosis of RSV. C/o sore throat, a little shortness of breath, feeling weak and tired. EMS placed patient on 2 liter of O2.  Is currently under treatment for stage 3 throat cancer. States he does radiation Monday through Friday and takes chemo on Mondays.

## 2023-05-21 NOTE — ED Notes (Signed)
 Dr Mariea Clonts said he may have a thrombosis in his neck, so not to ambulate patient till they know more.

## 2023-05-22 ENCOUNTER — Inpatient Hospital Stay (HOSPITAL_COMMUNITY): Payer: Medicare HMO

## 2023-05-22 DIAGNOSIS — A419 Sepsis, unspecified organism: Secondary | ICD-10-CM | POA: Diagnosis present

## 2023-05-22 DIAGNOSIS — I82C11 Acute embolism and thrombosis of right internal jugular vein: Secondary | ICD-10-CM

## 2023-05-22 DIAGNOSIS — R55 Syncope and collapse: Secondary | ICD-10-CM

## 2023-05-22 DIAGNOSIS — J189 Pneumonia, unspecified organism: Secondary | ICD-10-CM | POA: Diagnosis present

## 2023-05-22 HISTORY — DX: Sepsis, unspecified organism: A41.9

## 2023-05-22 LAB — COMPREHENSIVE METABOLIC PANEL
ALT: 13 U/L (ref 0–44)
AST: 14 U/L — ABNORMAL LOW (ref 15–41)
Albumin: 2.8 g/dL — ABNORMAL LOW (ref 3.5–5.0)
Alkaline Phosphatase: 41 U/L (ref 38–126)
Anion gap: 6 (ref 5–15)
BUN: 15 mg/dL (ref 6–20)
CO2: 22 mmol/L (ref 22–32)
Calcium: 8.1 mg/dL — ABNORMAL LOW (ref 8.9–10.3)
Chloride: 107 mmol/L (ref 98–111)
Creatinine, Ser: 1.13 mg/dL (ref 0.61–1.24)
GFR, Estimated: >60 mL/min (ref >60)
Glucose, Bld: 108 mg/dL — ABNORMAL HIGH (ref 70–99)
Potassium: 3.7 mmol/L (ref 3.5–5.1)
Sodium: 135 mmol/L (ref 135–145)
Total Bilirubin: 0.3 mg/dL (ref 0.0–1.2)
Total Protein: 6 g/dL — ABNORMAL LOW (ref 6.5–8.1)

## 2023-05-22 LAB — CBC
HCT: 31.6 % — ABNORMAL LOW (ref 39.0–52.0)
Hemoglobin: 10.9 g/dL — ABNORMAL LOW (ref 13.0–17.0)
MCH: 30.4 pg (ref 26.0–34.0)
MCHC: 34.5 g/dL (ref 30.0–36.0)
MCV: 88.3 fL (ref 80.0–100.0)
Platelets: 123 10*3/uL — ABNORMAL LOW (ref 150–400)
RBC: 3.58 MIL/uL — ABNORMAL LOW (ref 4.22–5.81)
RDW: 13.1 % (ref 11.5–15.5)
WBC: 12.1 10*3/uL — ABNORMAL HIGH (ref 4.0–10.5)
nRBC: 0 % (ref 0.0–0.2)

## 2023-05-22 LAB — ECHOCARDIOGRAM COMPLETE
AR max vel: 2.03 cm2
AV Area VTI: 2.14 cm2
AV Area mean vel: 2.1 cm2
AV Mean grad: 6 mmHg
AV Peak grad: 11 mmHg
Ao pk vel: 1.66 m/s
Area-P 1/2: 4.06 cm2
Height: 64 in
S' Lateral: 2.9 cm
Weight: 2224 [oz_av]

## 2023-05-22 LAB — URINE CULTURE
Culture: NO GROWTH
Special Requests: NORMAL

## 2023-05-22 LAB — ACTH STIMULATION, 3 TIME POINTS
Cortisol, 30 Min: 21.5 ug/dL
Cortisol, 60 Min: 22.3 ug/dL
Cortisol, Base: 16.4 ug/dL

## 2023-05-22 LAB — HEPARIN LEVEL (UNFRACTIONATED)
Heparin Unfractionated: >1.1 [IU]/mL — ABNORMAL HIGH (ref 0.30–0.70)
Heparin Unfractionated: >1.1 [IU]/mL — ABNORMAL HIGH (ref 0.30–0.70)
Heparin Unfractionated: 0.32 [IU]/mL (ref 0.30–0.70)

## 2023-05-22 LAB — GLUCOSE, CAPILLARY
Glucose-Capillary: 143 mg/dL — ABNORMAL HIGH (ref 70–99)
Glucose-Capillary: 123 mg/dL — ABNORMAL HIGH (ref 70–99)

## 2023-05-22 MED ORDER — GUAIFENESIN-DM 100-10 MG/5ML PO SYRP
15.0000 mL | ORAL_SOLUTION | Freq: Four times a day (QID) | ORAL | Status: DC
  Administered 2023-05-22 – 2023-05-27 (×18): 15 mL
  Filled 2023-05-22 (×18): qty 15

## 2023-05-22 MED ORDER — SODIUM CHLORIDE 0.9 % IV SOLN
2.0000 g | INTRAVENOUS | Status: DC
  Administered 2023-05-22 – 2023-05-27 (×6): 2 g via INTRAVENOUS
  Filled 2023-05-22 (×6): qty 20

## 2023-05-22 MED ORDER — SODIUM CHLORIDE 0.9% FLUSH
10.0000 mL | Freq: Two times a day (BID) | INTRAVENOUS | Status: DC
Start: 2023-05-22 — End: 2023-05-27
  Administered 2023-05-22 – 2023-05-26 (×8): 10 mL

## 2023-05-22 MED ORDER — SODIUM CHLORIDE 0.9% FLUSH: 10.0000 mL | INTRAVENOUS | Status: DC | PRN

## 2023-05-22 MED ORDER — SODIUM CHLORIDE 0.9 % IV SOLN
500.0000 mg | INTRAVENOUS | Status: DC
  Administered 2023-05-22 – 2023-05-26 (×5): 500 mg via INTRAVENOUS
  Filled 2023-05-22 (×5): qty 5

## 2023-05-22 MED ORDER — HEPARIN (PORCINE) 25000 UT/250ML-% IV SOLN
1450.0000 [IU]/h | INTRAVENOUS | Status: DC
  Administered 2023-05-22 – 2023-05-23 (×2): 950 [IU]/h via INTRAVENOUS
  Administered 2023-05-24: 1100 [IU]/h via INTRAVENOUS
  Administered 2023-05-25: 1350 [IU]/h via INTRAVENOUS
  Administered 2023-05-26 (×2): 1450 [IU]/h via INTRAVENOUS
  Filled 2023-05-22 (×6): qty 250

## 2023-05-22 MED ORDER — CHLORHEXIDINE GLUCONATE CLOTH 2 % EX PADS
6.0000 | MEDICATED_PAD | Freq: Every day | CUTANEOUS | Status: DC
  Administered 2023-05-22 – 2023-05-27 (×6): 6 via TOPICAL

## 2023-05-22 MED ORDER — DM-GUAIFENESIN ER 30-600 MG PO TB12: 1.0000 | ORAL_TABLET | Freq: Two times a day (BID) | ORAL | Status: DC

## 2023-05-22 NOTE — Progress Notes (Signed)
 PROGRESS NOTE  [name], is a 53 y.o. male, DOB - 12-16-1970, YQM:[id number]  Admit date - 05/21/2023   Admitting Physician [name], DO  Outpatient Primary MD for the patient is [name], MD  LOS - 1  Chief Complaint  Patient presents with   Loss of Consciousness      Brief Narrative:   53 y.o. 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, cN1, cM0)) in January 2025 who started radiation on 04/14/2023, chemotherapy with cisplatin on 04/19/2023, s/p Port-A-Cath and G-tube placement 04/28/2023 admitted on 05/21/23 after a syncopal event, and found to have right IJ acute DVT as well as sepsis secondary to pneumonia with persistent fevers    -Assessment and Plan: 1)Syncope-no significant arrhythmia on telemetry monitored unit,  -Ruled out already for ACS by cardiac enzymes and EKG - echocardiogram without significant aortic stenosis or other outflow obstruction, and  EF is 60 to 65%, No segmental/Regional wall motion abnormalities.,  No diastolic dysfunction, mild LVH normal) -Carotid artery Dopplers with incidental finding of--Incidental note is made of acute occlusive thrombus throughout the right internal jugular vein. -No hemodynamically significant carotid artery stenosis   2)Acute DVT----Carotid artery Dopplers with incidental finding of--Incidental note is made of acute occlusive thrombus throughout the right internal jugular vein----in the setting of malignancy and right-sided Port-A-Cath in situ -No hemodynamically significant carotid artery stenosis -- c/n  IV heparin, plan to transition to DOAC at discharge -Echo as noted above #1   3)Sepsis secondary to CAP with persistent fevers febrile illness----Tmax 103.1 -COVID, flu and RSV  negative --CTA chest without PE, emphysema noted, bronchitis and possible pneumonia --Lactic acid 3.7 repeat 2.3--- received IV fluids  -UA not suggestive of UTI --WBC 3.2 >> 12.1  -Rocephin/azithromycin pending culture data -Blood cultures pending -  4)A.m. cortisol is 4.7--- ACTH stimulation test with appropriate response (Cortisol- 16.4 >>21.5 >>22.3) --Patient presented with syncope and hypotension---   5)Hypokalemia--- resolved with replacement - magnesium WNL,     6)Squamous cell carcinoma of the supraglottics (stage III (cT3, cN1, cM0)) in January 2025  --started radiation on 04/14/2023, chemotherapy with cisplatin on 04/19/2023,  He gets radiation Monday through Friday and takes chemo on Mondays. Last seen by primary oncologist--Dr. [name] on 05/14/23 -Discussed with the primary oncologist via secure chat   7) dysphagia/odynophagia--- due to supraglottic squamous cell carcinoma and subsequent radiotherapy--- --dietitian consult for adjustment of PEG tube feeding appreciated -Patient has had poor tolerance to bolus feeding dietitian recommends continues to fluid with Osmolite 1.5, with plans to transition to bolus feeding later if patient tolerates continuous feeding at goal rate   Status is: Inpatient   Disposition: The patient is from: Home              Anticipated d/c is to: Home              Anticipated d/c date is: 2 days              Patient currently is not medically stable to d/c. Barriers: Not Clinically Stable-   Code Status :  -  Code Status: Full Code   Family Communication:     (patient is alert, awake and coherent) Discussed with wife at bedside  DVT Prophylaxis  :   - SCDs /Iv Heparin  SCDs Start:  05/21/23 0822 Place TED hose Start: 05/21/23 0822   Lab Results  Component Value Date   PLT 123 (L) 05/22/2023    Inpatient Medications  Scheduled Meds:  Chlorhexidine Gluconate Cloth  6 each Topical Daily   famotidine  40 mg Per Tube Daily    FLUoxetine  20 mg Per Tube Daily   free water  200 mL Per Tube Q4H   guaiFENesin-dextromethorphan  15 mL Per Tube Q6H WA   metoprolol succinate  25 mg Oral Daily   QUEtiapine  50 mg Per Tube QHS   sodium chloride flush  10-40 mL Intracatheter Q12H   sodium chloride flush  3 mL Intravenous Q12H   sodium chloride flush  3 mL Intravenous Q12H   Continuous Infusions:  azithromycin 500 mg (05/22/23 1243)   cefTRIAXone (ROCEPHIN)  IV 2 g (05/22/23 1205)   feeding supplement (OSMOLITE 1.5 CAL) 70 mL/hr at 05/22/23 1511   heparin 950 Units/hr (05/22/23 1124)   PRN Meds:.acetaminophen **OR** acetaminophen, bisacodyl, hydrOXYzine, ondansetron **OR** ondansetron (ZOFRAN) IV, polyethylene glycol, sodium chloride flush, sodium chloride flush   Anti-infectives (From admission, onward)    Start     Dose/Rate Route Frequency Ordered Stop   05/22/23 1215  cefTRIAXone (ROCEPHIN) 2 g in sodium chloride 0.9 % 100 mL IVPB        2 g 200 mL/hr over 30 Minutes Intravenous Every 24 hours 05/22/23 1115     05/22/23 1215  azithromycin (ZITHROMAX) 500 mg in sodium chloride 0.9 % 250 mL IVPB        500 mg 250 mL/hr over 60 Minutes Intravenous Every 24 hours 05/22/23 1115           Subjective: [name] today has no emesis,  No chest pain,    Wife and sister-in-law is at bedside -Questions answered --Persistent fevers with Tmax of 103    Objective: Vitals:   05/22/23 0632 05/22/23 1024 05/22/23 1446 05/22/23 1720  BP: 136/85 131/78 (!) 140/83   Pulse: 86 77 82   Resp: 20 18 17    Temp: (!) 101.8 F (38.8 C) (!) 101.9 F (38.8 C) (!) 102.3 F (39.1 C) (!) 103 F (39.4 C)  TempSrc: Oral Oral Oral Oral  SpO2: 98% 98% 99%   Weight:      Height:        Intake/Output Summary (Last 24 hours) at 05/22/2023 1742 Last data filed at 05/22/2023 1500 Gross per 24 hour  Intake 3185.64 ml  Output 0 ml  Net 3185.64 ml   Filed Weights   05/21/23 0102 05/21/23 1648  Weight: 68 kg 63 kg     Physical Exam  Gen:- Awake Alert, chronically ill-appearing HEENT:- [place].AT, No sclera icterus Neck-Supple Neck,No JVD, radiation injury to skin of neck area Lungs-  CTAB , fair air movement bilaterally  CV- S1, S2 normal, RRR, right-sided Port-A-Cath in situ--does not appear infected Abd-  +ve B.Sounds, Abd Soft, No tenderness, PEG tube site is clean dry and intact Extremity/Skin:- No  edema, pedal pulses present  Psych-affect is appropriate, oriented x3 Neuro-generalized weakness, no new focal deficits, no tremors  Data Reviewed: I have personally reviewed following labs and imaging studies  CBC: Recent Labs  Lab 05/21/23 0148 05/22/23 0418  WBC 3.2* 12.1*  NEUTROABS 2.0  --   HGB 12.3* 10.9*  HCT 36.9* 31.6*  MCV 89.6 88.3  PLT 169 123*   Basic Metabolic Panel: Recent Labs  Lab 05/21/23 0148 05/21/23 0824 05/22/23 0418  NA 137  --  135  K 2.9*  --  3.7  CL 101  --  107  CO2 22  --  22  GLUCOSE 104*  --  108*  BUN 13  --  15  CREATININE 1.14  --  1.13  CALCIUM 8.9  --  8.1*  MG  --  2.2  --    GFR: Estimated Creatinine Clearance: 62.6 mL/min (by C-G formula based on SCr of 1.13 mg/dL). Liver Function Tests: Recent Labs  Lab 05/21/23 0148 05/22/23 0418  AST 14* 14*  ALT 14 13  ALKPHOS 44 41  BILITOT 0.6 0.3  PROT 7.2 6.0*  ALBUMIN 3.4* 2.8*   Recent Results (from the past 240 hours)  Urine Culture     Status: None   Collection Time: 05/21/23  1:37 AM   Specimen: Urine, Clean Catch  Result Value Ref Range Status   Specimen Description   Final    URINE, CLEAN CATCH Performed at [facility], [address], [place], [place] [address]    Special Requests   Final    Normal Performed at [facility], [address], [place], [place] [address]    Culture   Final    NO GROWTH Performed at [facility], [address], [place], [place] [address]    Report Status 05/22/2023 FINAL  Final  Resp panel by RT-PCR (RSV, Flu A&B, Covid) Anterior  Nasal Swab     Status: None   Collection Time: 05/21/23  1:48 AM   Specimen: Anterior Nasal Swab  Result Value Ref Range Status   SARS Coronavirus 2 by RT PCR NEGATIVE NEGATIVE Final    Comment: (NOTE) SARS-CoV-2 target nucleic acids are NOT DETECTED.  The SARS-CoV-2 RNA is generally detectable in upper respiratory specimens during the acute phase of infection. The lowest concentration of SARS-CoV-2 viral copies this assay can detect is 138 copies/mL. A negative result does not preclude SARS-Cov-2 infection and should not be used as the sole basis for treatment or other patient management decisions. A negative result may occur with  improper specimen collection/handling, submission of specimen other than nasopharyngeal swab, presence of viral mutation(s) within the areas targeted by this assay, and inadequate number of viral copies(<138 copies/mL). A negative result must be combined with clinical observations, patient history, and epidemiological information. The expected result is Negative.  Fact Sheet for Patients:  BloggerCourse.com  Fact Sheet for Healthcare Providers:  SeriousBroker.it  This test is no t yet approved or cleared by the [place] FDA and  has been authorized for detection and/or diagnosis of SARS-CoV-2 by FDA under an Emergency Use Authorization (EUA). This EUA will remain  in effect (meaning this test can be used) for the duration of the COVID-19 declaration under Section 564(b)(1) of the Act, 21 U.S.C.section 360bbb-3(b)(1), unless the authorization is terminated  or revoked sooner.       Influenza A by PCR NEGATIVE NEGATIVE Final   Influenza B by PCR NEGATIVE NEGATIVE Final    Comment: (NOTE) The Xpert Xpress SARS-CoV-2/FLU/RSV plus assay is intended as an aid in the diagnosis of influenza from Nasopharyngeal swab specimens and should not be used as a sole basis for treatment. Nasal washings  and aspirates are unacceptable for Xpert Xpress SARS-CoV-2/FLU/RSV testing.  Fact Sheet for Patients: BloggerCourse.com  Fact Sheet for Healthcare Providers: SeriousBroker.it  This test is not yet approved or cleared by the [place] FDA and has been authorized for detection and/or diagnosis of SARS-CoV-2 by FDA under an Emergency  Use Authorization (EUA). This EUA will remain in effect (meaning this test can be used) for the duration of the COVID-19 declaration under Section 564(b)(1) of the Act, 21 U.S.C. section 360bbb-3(b)(1), unless the authorization is terminated or revoked.     Resp Syncytial Virus by PCR NEGATIVE NEGATIVE Final    Comment: (NOTE) Fact Sheet for Patients: BloggerCourse.com  Fact Sheet for Healthcare Providers: SeriousBroker.it  This test is not yet approved or cleared by the [place] FDA and has been authorized for detection and/or diagnosis of SARS-CoV-2 by FDA under an Emergency Use Authorization (EUA). This EUA will remain in effect (meaning this test can be used) for the duration of the COVID-19 declaration under Section 564(b)(1) of the Act, 21 U.S.C. section 360bbb-3(b)(1), unless the authorization is terminated or revoked.  Performed at [facility], [address], [place], [place] [address]   Culture, blood (routine x 2)     Status: None (Preliminary result)   Collection Time: 05/21/23  2:07 AM   Specimen: BLOOD  Result Value Ref Range Status   Specimen Description BLOOD PORTA CATH  Final   Special Requests   Final    BOTTLES DRAWN AEROBIC AND ANAEROBIC Blood Culture adequate volume   Culture   Final    NO GROWTH 1 DAY Performed at [facility], [address], [place], [place] [address]    Report Status PENDING  Incomplete  Culture, blood (routine x 2)     Status: None (Preliminary result)   Collection Time: 05/21/23  2:07 AM    Specimen: BLOOD  Result Value Ref Range Status   Specimen Description BLOOD LEFT ANTECUBITAL  Final   Special Requests   Final    BOTTLES DRAWN AEROBIC AND ANAEROBIC Blood Culture adequate volume   Culture   Final    NO GROWTH 1 DAY Performed at [facility], [address] [place], [place] [address]    Report Status PENDING  Incomplete    Radiology Studies: ECHOCARDIOGRAM COMPLETE Result Date: 05/22/2023    ECHOCARDIOGRAM REPORT   Patient Name:   RAIFORD FETTERMAN Date of Exam: 05/22/2023 Medical Rec #:  [id number]      Height:       64.0 in Accession #:    [id number]     Weight:       139.0 lb Date of Birth:  04-Feb-1971     BSA:          1.676 m Patient Age:    52 years       BP:           136/85 mmHg Patient Gender: M              HR:           72 bpm. [facility] Procedure: 2D Echo, 3D Echo, Cardiac Doppler, Color Doppler and Strain Analysis            (Both Spectral and Color Flow Doppler were utilized during            procedure). Indications:    Syncope  History:        Patient has prior history of Echocardiogram examinations, most                 recent 12/24/2016. Risk Factors:Hypertension and Former Smoker.  Sonographer:    Karma Ganja Referring Phys: [id number] Dana Dorner  Sonographer Comments: Global longitudinal strain was attempted. IMPRESSIONS  1. Left ventricular ejection fraction, by estimation, is  60 to 65%. The left ventricle has normal function. The left ventricle has no regional wall motion abnormalities. Left ventricular diastolic parameters were normal. The average left ventricular global longitudinal strain is -18.2 %. The global longitudinal strain is normal.  2. Right ventricular systolic function is normal. The right ventricular size is normal.  3. The mitral valve is normal in structure. No evidence of mitral valve regurgitation. No evidence of mitral stenosis.  4. The aortic valve is normal in structure. Aortic valve regurgitation is not visualized. No aortic  stenosis is present.  5. The inferior vena cava is normal in size with greater than 50% respiratory variability, suggesting right atrial pressure of 3 mmHg. FINDINGS  Left Ventricle: Left ventricular ejection fraction, by estimation, is 60 to 65%. The left ventricle has normal function. The left ventricle has no regional wall motion abnormalities. The average left ventricular global longitudinal strain is -18.2 %. Strain was performed and the global longitudinal strain is normal. 3D ejection fraction reviewed and evaluated as part of the interpretation. Alternate measurement of EF is felt to be most reflective of LV function. The left ventricular internal cavity size was normal in size. There is no left ventricular hypertrophy. Left ventricular diastolic parameters were normal. Right Ventricle: The right ventricular size is normal. No increase in right ventricular wall thickness. Right ventricular systolic function is normal. Left Atrium: Left atrial size was normal in size. Right Atrium: Right atrial size was normal in size. Pericardium: There is no evidence of pericardial effusion. Mitral Valve: The mitral valve is normal in structure. No evidence of mitral valve regurgitation. No evidence of mitral valve stenosis. Tricuspid Valve: The tricuspid valve is normal in structure. Tricuspid valve regurgitation is not demonstrated. No evidence of tricuspid stenosis. Aortic Valve: The aortic valve is normal in structure. Aortic valve regurgitation is not visualized. No aortic stenosis is present. Aortic valve mean gradient measures 6.0 mmHg. Aortic valve peak gradient measures 11.0 mmHg. Aortic valve area, by VTI measures 2.14 cm. Pulmonic Valve: The pulmonic valve was not well visualized. Aorta: The aortic root is normal in size and structure. Venous: The inferior vena cava is normal in size with greater than 50% respiratory variability, suggesting right atrial pressure of 3 mmHg. IAS/Shunts: No atrial level shunt  detected by color flow Doppler. Additional Comments: 3D was performed not requiring image post processing on an independent workstation and was indeterminate.  LEFT VENTRICLE PLAX 2D LVIDd:         4.30 cm   Diastology LVIDs:         2.90 cm   LV e' medial:    9.79 cm/s LV PW:         0.80 cm   LV E/e' medial:  9.0 LV IVS:        0.90 cm   LV e' lateral:   15.80 cm/s LVOT diam:     1.90 cm   LV E/e' lateral: 5.6 LV SV:         62 LV SV Index:   37        2D Longitudinal Strain LVOT Area:     2.84 cm  2D Strain GLS Avg:     -18.2 %                           3D Volume EF:  3D EF:        52 %                          LV EDV:       182 ml                          LV ESV:       87 ml                          LV SV:        95 ml RIGHT VENTRICLE RV Basal diam:  4.10 cm RV S prime:     17.60 cm/s TAPSE (M-mode): 2.6 cm LEFT ATRIUM             Index        RIGHT ATRIUM           Index LA diam:        2.90 cm 1.73 cm/m   RA Area:     10.20 cm LA Vol (A2C):   65.5 ml 39.08 ml/m  RA Volume:   19.20 ml  11.46 ml/m LA Vol (A4C):   28.2 ml 16.82 ml/m LA Biplane Vol: 43.0 ml 25.66 ml/m  AORTIC VALVE AV Area (Vmax):    2.03 cm AV Area (Vmean):   2.10 cm AV Area (VTI):     2.14 cm AV Vmax:           166.00 cm/s AV Vmean:          106.000 cm/s AV VTI:            0.290 m AV Peak Grad:      11.0 mmHg AV Mean Grad:      6.0 mmHg LVOT Vmax:         119.00 cm/s LVOT Vmean:        78.400 cm/s LVOT VTI:          0.219 m LVOT/AV VTI ratio: 0.76  AORTA Ao Root diam: 3.10 cm MITRAL VALVE MV Area (PHT): 4.06 cm    SHUNTS MV Decel Time: 187 msec    Systemic VTI:  0.22 m MV E velocity: 87.80 cm/s  Systemic Diam: 1.90 cm MV A velocity: 75.00 cm/s MV E/A ratio:  1.17 Mihai Croitoru MD Electronically signed by Thurmon Fair MD Signature Date/Time: 05/22/2023/12:49:20 PM    Final    US Carotid Bilateral Result Date: 05/21/2023 CLINICAL DATA:  Syncope EXAM: BILATERAL CAROTID DUPLEX ULTRASOUND TECHNIQUE: Wallace Cullens scale  imaging, color Doppler and duplex ultrasound were performed of bilateral carotid and vertebral arteries in the neck. COMPARISON:  None Available. FINDINGS: Criteria: Quantification of carotid stenosis is based on velocity parameters that correlate the residual internal carotid diameter with NASCET-based stenosis levels, using the diameter of the distal internal carotid lumen as the denominator for stenosis measurement. The following velocity measurements were obtained: RIGHT ICA: 114/35 cm/sec CCA: 118/16 cm/sec SYSTOLIC ICA/CCA RATIO:  1.3 ECA:  68 cm/sec LEFT ICA: 92/29 cm/sec CCA: 106/26 cm/sec SYSTOLIC ICA/CCA RATIO:  1.0 ECA:  84 cm/sec RIGHT CAROTID ARTERY: Mild heterogeneous atherosclerotic plaque in the proximal internal carotid artery. By peak systolic velocity criteria, the estimated stenosis is less than 50%. RIGHT VERTEBRAL ARTERY:  Patent with normal antegrade flow. LEFT CAROTID ARTERY: Mild heterogeneous atherosclerotic plaque in the proximal internal carotid artery. By peak systolic velocity criteria, the estimated  stenosis is less than 50%. LEFT VERTEBRAL ARTERY:  Patent with normal antegrade flow. Other: Incidental note is made of thrombus within the right internal jugular vein. The vein is noncompressible in there is no evidence of color flow. Findings are consistent with acute occlusive thrombus. IMPRESSION: 1. Incidental note is made of acute occlusive thrombus throughout the right internal jugular vein. 2. Mild (1-49%) stenosis proximal right internal carotid artery secondary to heterogenous atherosclerotic plaque. 3. Mild (1-49%) stenosis proximal left internal carotid artery secondary to heterogenous atherosclerotic plaque. 4. Vertebral arteries are patent with normal antegrade flow. Electronically Signed   By: Malachy Moan M.D.   On: 05/21/2023 13:55   CT Angio Chest Pulmonary Embolism (PE) W or WO Contrast Result Date: 05/21/2023 CLINICAL DATA:  History of laryngeal cancer with  syncopal episode, shortness of breath, and weakness. Altered mental status EXAM: CT ANGIOGRAPHY CHEST WITH CONTRAST TECHNIQUE: Multidetector CT imaging of the chest was performed using the standard protocol during bolus administration of intravenous contrast. Multiplanar CT image reconstructions and MIPs were obtained to evaluate the vascular anatomy. RADIATION DOSE REDUCTION: This exam was performed according to the departmental dose-optimization program which includes automated exposure control, adjustment of the mA and/or kV according to patient size and/or use of iterative reconstruction technique. CONTRAST:  75mL OMNIPAQUE IOHEXOL 350 MG/ML SOLN COMPARISON:  Same day chest radiograph FINDINGS: Cardiovascular: Right chest wall port terminates in the right atrium. The study is high quality for the evaluation of pulmonary embolism. There are no filling defects in the central, lobar, segmental or subsegmental pulmonary artery branches to suggest acute pulmonary embolism. Great vessels are normal in course and caliber. Normal heart size. No significant pericardial fluid/thickening. Coronary artery calcifications and aortic atherosclerosis. Mediastinum/Nodes: Imaged thyroid gland without nodules meeting criteria for imaging follow-up by size. Normal esophagus. No pathologically enlarged axillary, supraclavicular, mediastinal, or hilar lymph nodes. Lungs/Pleura: The central airways are patent. Layering secretions within the trachea extending into bilateral bronchi with left lower lobe subsegmental mucous plugging. Mild diffuse bronchial wall thickening. Minimal right apical paraseptal emphysema. Medial bilateral lower lobe tree-in-bud and ground-glass nodules. No pneumothorax. No pleural effusion. Upper abdomen: Partially imaged percutaneous gastrostomy tube in-situ. Musculoskeletal: No acute or abnormal lytic or blastic osseous lesions. Review of the MIP images confirms the above findings. IMPRESSION: 1. No evidence  of pulmonary embolism. 2. Findings of bronchitis with medial bilateral lower lobe tree-in-bud and ground-glass nodules, likely infectious/inflammatory. 3. Aortic Atherosclerosis (ICD10-I70.0) and Emphysema (ICD10-J43.9). Coronary artery calcifications. Assessment for potential risk factor modification, dietary therapy or pharmacologic therapy may be warranted, if clinically indicated. Electronically Signed   By: Agustin Cree M.D.   On: 05/21/2023 12:01   US Venous Img Lower Bilateral (DVT) Result Date: 05/21/2023 CLINICAL DATA:  Pulmonary emboli (HCC) EXAM: BILATERAL LOWER EXTREMITY VENOUS DOPPLER ULTRASOUND TECHNIQUE: Gray-scale sonography with graded compression, as well as color Doppler and duplex ultrasound were performed to evaluate the lower extremity deep venous systems from the level of the common femoral vein and including the common femoral, femoral, profunda femoral, popliteal and calf veins including the posterior tibial, peroneal and gastrocnemius veins when visible. The superficial great saphenous vein was also interrogated. Spectral Doppler was utilized to evaluate flow at rest and with distal augmentation maneuvers in the common femoral, femoral and popliteal veins. COMPARISON:  None Available. FINDINGS: RIGHT LOWER EXTREMITY Common Femoral Vein: No evidence of thrombus. Normal compressibility, respiratory phasicity and response to augmentation. Saphenofemoral Junction: No evidence of thrombus. Normal compressibility and flow on color  Doppler imaging. Profunda Femoral Vein: No evidence of thrombus. Normal compressibility and flow on color Doppler imaging. Femoral Vein: No evidence of thrombus. Normal compressibility, respiratory phasicity and response to augmentation. Popliteal Vein: No evidence of thrombus. Normal compressibility, respiratory phasicity and response to augmentation. Calf Veins: No evidence of thrombus. Normal compressibility and flow on color Doppler imaging. LEFT LOWER EXTREMITY  Common Femoral Vein: No evidence of thrombus. Normal compressibility, respiratory phasicity and response to augmentation. Saphenofemoral Junction: No evidence of thrombus. Normal compressibility and flow on color Doppler imaging. Profunda Femoral Vein: No evidence of thrombus. Normal compressibility and flow on color Doppler imaging. Femoral Vein: No evidence of thrombus. Normal compressibility, respiratory phasicity and response to augmentation. Popliteal Vein: No evidence of thrombus. Normal compressibility, respiratory phasicity and response to augmentation. Calf Veins: No evidence of thrombus. Normal compressibility and flow on color Doppler imaging. IMPRESSION: No evidence of deep venous thrombosis in either lower extremity. Electronically Signed   By: Feliberto Harts M.D.   On: 05/21/2023 10:42   DG Chest Port 1 View Result Date: 05/21/2023 CLINICAL DATA:  weakness EXAM: PORTABLE CHEST 1 VIEW COMPARISON:  Chest x-ray 05/03/2023 FINDINGS: The heart and mediastinal contours are unchanged. Right chest wall accessed Port-A-Cath with tip overlying the expected region the superior caval junction. Atherosclerotic plaque. No focal consolidation. No pulmonary edema. No pleural effusion. No pneumothorax. No acute osseous abnormality. IMPRESSION: 1. No active disease. 2.  Aortic Atherosclerosis (ICD10-I70.0). Electronically Signed   By: Tish Frederickson M.D.   On: 05/21/2023 01:59   Scheduled Meds:  Chlorhexidine Gluconate Cloth  6 each Topical Daily   famotidine  40 mg Per Tube Daily   FLUoxetine  20 mg Per Tube Daily   free water  200 mL Per Tube Q4H   guaiFENesin-dextromethorphan  15 mL Per Tube Q6H WA   metoprolol succinate  25 mg Oral Daily   QUEtiapine  50 mg Per Tube QHS   sodium chloride flush  10-40 mL Intracatheter Q12H   sodium chloride flush  3 mL Intravenous Q12H   sodium chloride flush  3 mL Intravenous Q12H   Continuous Infusions:  azithromycin 500 mg (05/22/23 1243)   cefTRIAXone  (ROCEPHIN)  IV 2 g (05/22/23 1205)   feeding supplement (OSMOLITE 1.5 CAL) 70 mL/hr at 05/22/23 1511   heparin 950 Units/hr (05/22/23 1124)    LOS: 1 day   Shon Hale M.D on 05/22/2023 at 5:42 PM  Go to [email or web address] - for contact info  [facility] - Office  [phone]  If 7PM-7AM, please contact night-coverage [email or web address] 05/22/2023, 5:42 PM

## 2023-05-22 NOTE — Progress Notes (Signed)
   05/22/23 1603  TOC Brief Assessment  Insurance and Status Reviewed  Patient has primary care physician Yes  Home environment has been reviewed From home with spouse  Prior level of function: Need assistance  Prior/Current Home Services Current home services (Uses a feeding tube)  Social Drivers of Health Review SDOH reviewed no interventions necessary  Readmission risk has been reviewed Yes ([place])  Transition of care needs no transition of care needs at this time   Pt had full assessment 2 weeks ago, see note. No new needs at this time,TOC will be available should a need arise.

## 2023-05-22 NOTE — Progress Notes (Signed)
  Echocardiogram 2D Echocardiogram has been performed.  [name] 05/22/2023, 10:17 AM

## 2023-05-22 NOTE — Progress Notes (Signed)
 PHARMACY - ANTICOAGULATION CONSULT NOTE  Pharmacy Consult for Heparin Indication: DVT  No Known Allergies  Patient Measurements: Height: 5\' 4"  (162.6 cm) Weight: 63 kg (139 lb) IBW/kg (Calculated) : 59.2 HEPARIN DW (KG): 63   Vital Signs: Temp: 101.8 F (38.8 C) (02/22 0632) Temp Source: Oral (02/22 0632) BP: 136/85 (02/22 1610) Pulse Rate: 86 (02/22 0632)  Labs: Recent Labs    05/21/23 0148 05/21/23 1734 05/21/23 2100 05/22/23 0418 05/22/23 0831  HGB 12.3*  --   --  10.9*  --   HCT 36.9*  --   --  31.6*  --   PLT 169  --   --  123*  --   HEPARINUNFRC  --   --  0.66 >1.10* >1.10*  CREATININE 1.14  --   --  1.13  --   TROPONINIHS  --  <2  --   --   --     Estimated Creatinine Clearance: 62.6 mL/min (by C-G formula based on SCr of 1.13 mg/dL).   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   Substance induced mood disorder (HCC) 11/05/2016    Medications:  See med rec  Assessment: 53 year old male with past medical history of throat cancer currently undergoing chemotherapy and radiation. Patient brought by EMS after a syncopal episode, Patient had bilateral carotid duplex u/s and showed acute occlusive thrombus throughout the right internal jugular vein. Not on oral anticoagulants prior to admission. Pharmacy asked to start heparin  Initial level 0.66, therapeutic but now HL >1.1, supratherapeutic. Redrawn to ensure correct. No issues with infusion or bleeding per RN   Goal of Therapy:  Heparin level 0.3-0.7 units/ml Monitor platelets by anticoagulation protocol: Yes   Plan:  Hold heparin for 1 hours then decrease infusion to 950 units/hr Check  anti-Xa level in ~6-8 hours and daily while on heparin Continue to monitor H&H and platelets  Elder Cyphers, BS Pharm D, BCPS Clinical Pharmacist 05/22/2023,9:56 AM

## 2023-05-22 NOTE — Progress Notes (Signed)
 PHARMACY - ANTICOAGULATION CONSULT NOTE  Pharmacy Consult for Heparin Indication: DVT  No Known Allergies  Patient Measurements: Height: 5\' 4"  (162.6 cm) Weight: 63 kg (139 lb) IBW/kg (Calculated) : 59.2 HEPARIN DW (KG): 63   Vital Signs: Temp: 103 F (39.4 C) (02/22 1720) Temp Source: Oral (02/22 1720) BP: 140/83 (02/22 1446) Pulse Rate: 82 (02/22 1446)  Labs: Recent Labs    05/21/23 0148 05/21/23 1734 05/21/23 2100 05/22/23 0418 05/22/23 0831 05/22/23 1724  HGB 12.3*  --   --  10.9*  --   --   HCT 36.9*  --   --  31.6*  --   --   PLT 169  --   --  123*  --   --   HEPARINUNFRC  --   --    < > >1.10* >1.10* 0.32  CREATININE 1.14  --   --  1.13  --   --   TROPONINIHS  --  <2  --   --   --   --    < > = values in this interval not displayed.    Estimated Creatinine Clearance: 62.6 mL/min (by C-G formula based on SCr of 1.13 mg/dL).   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)    has some ST memory deficits from stroke.   Substance induced mood disorder (HCC) 11/05/2016    Medications:  See med rec  Assessment: 53 year old male with past medical history of throat cancer currently undergoing chemotherapy and radiation. Patient brought by EMS after a syncopal episode, Patient had bilateral carotid duplex u/s and showed acute occlusive thrombus throughout the right internal jugular vein. Not on oral anticoagulants prior to admission. Pharmacy asked to start heparin  2/22 1724: HL 0.32, therapeutic x 1 on 950 units/hr   Goal of Therapy:  Heparin level 0.3-0.7 units/ml Monitor platelets by anticoagulation protocol: Yes   Plan:  Heparin level is therapeutic Continue heparin infusion at a  rate of 950 units/hr Check heparin level in 6 hours Continue to monitor CBC daily  Paulita Fujita, PharmD Clinical Pharmacist 05/22/2023,6:00 PM

## 2023-05-22 NOTE — Progress Notes (Signed)
   05/22/23 1024  Assess: MEWS Score  Temp (!) 101.9 F (38.8 C)  BP 131/78  MAP (mmHg) 93  Pulse Rate 77  Resp 18  SpO2 98 %  O2 Device Room Air  Assess: MEWS Score  MEWS Temp 2  MEWS Systolic 0  MEWS Pulse 0  MEWS RR 0  MEWS LOC 0  MEWS Score 2  MEWS Score Color Yellow  Assess: if the MEWS score is Yellow or Red  Were vital signs accurate and taken at a resting state? Yes  Does the patient meet 2 or more of the SIRS criteria? Yes  MEWS guidelines implemented  No, previously yellow, continue vital signs every 4 hours  Notify: Charge Nurse/RN  Name of Charge Nurse/RN Notified Surveyor, minerals  Provider Notification  Provider Name/Title Dr. Shon Hale  Date Provider Notified 05/22/23  Time Provider Notified 1114  Method of Notification Page (secure chat)  Notification Reason Other (Comment) (update that patient continues to have fevers)  Date of Provider Response 05/22/23  Time of Provider Response 1115  Assess: SIRS CRITERIA  SIRS Temperature  1  SIRS Respirations  0  SIRS Pulse 0  SIRS WBC 1  SIRS Score Sum  2

## 2023-05-23 DIAGNOSIS — I82C11 Acute embolism and thrombosis of right internal jugular vein: Secondary | ICD-10-CM | POA: Diagnosis not present

## 2023-05-23 LAB — HEPARIN LEVEL (UNFRACTIONATED)
Heparin Unfractionated: 0.35 [IU]/mL (ref 0.30–0.70)
Heparin Unfractionated: 0.39 [IU]/mL (ref 0.30–0.70)

## 2023-05-23 LAB — CBC
HCT: 29.1 % — ABNORMAL LOW (ref 39.0–52.0)
Hemoglobin: 9.9 g/dL — ABNORMAL LOW (ref 13.0–17.0)
MCH: 30.1 pg (ref 26.0–34.0)
MCHC: 34 g/dL (ref 30.0–36.0)
MCV: 88.4 fL (ref 80.0–100.0)
Platelets: 97 10*3/uL — ABNORMAL LOW (ref 150–400)
RBC: 3.29 MIL/uL — ABNORMAL LOW (ref 4.22–5.81)
RDW: 13.2 % (ref 11.5–15.5)
WBC: 9.9 10*3/uL (ref 4.0–10.5)
nRBC: 0 % (ref 0.0–0.2)

## 2023-05-23 MED ORDER — HYDROMORPHONE HCL 1 MG/ML IJ SOLN
0.5000 mg | INTRAMUSCULAR | Status: DC | PRN
  Administered 2023-05-23 – 2023-05-26 (×17): 0.5 mg via INTRAVENOUS
  Filled 2023-05-23 (×10): qty 1
  Filled 2023-05-23: qty 0.5
  Filled 2023-05-23: qty 1
  Filled 2023-05-23: qty 0.5
  Filled 2023-05-23 (×3): qty 1
  Filled 2023-05-23: qty 0.5
  Filled 2023-05-23: qty 1

## 2023-05-23 NOTE — Progress Notes (Signed)
 PHARMACY - ANTICOAGULATION Pharmacy Consult for Heparin Indication: DVT Brief A/P: Heparin level within goal range Continue Heparin at current rate   No Known Allergies  Patient Measurements: Height: 5\' 4"  (162.6 cm) Weight: 63 kg (139 lb) IBW/kg (Calculated) : 59.2 HEPARIN DW (KG): 63   Vital Signs: Temp: 99.7 F (37.6 C) (02/22 1934) Temp Source: Oral (02/22 1934) BP: 117/74 (02/22 1934) Pulse Rate: 80 (02/22 1934)  Labs: Recent Labs    05/21/23 0148 05/21/23 1734 05/21/23 2100 05/22/23 0418 05/22/23 0831 05/22/23 1724 05/23/23 0005  HGB 12.3*  --   --  10.9*  --   --   --   HCT 36.9*  --   --  31.6*  --   --   --   PLT 169  --   --  123*  --   --   --   HEPARINUNFRC  --   --    < > >1.10* >1.10* 0.32 0.39  CREATININE 1.14  --   --  1.13  --   --   --   TROPONINIHS  --  <2  --   --   --   --   --    < > = values in this interval not displayed.    Estimated Creatinine Clearance: 62.6 mL/min (by C-G formula based on SCr of 1.13 mg/dL).  Assessment: 53 y.o. male with DVT for heparin  Goal of Therapy:  Heparin level 0.3-0.7 units/ml Monitor platelets by anticoagulation protocol: Yes   Plan:  No change to heparin   Geannie Risen, PharmD, BCPS 05/23/2023,12:22 AM

## 2023-05-23 NOTE — Progress Notes (Signed)
 PHARMACY - ANTICOAGULATION CONSULT NOTE  Pharmacy Consult for Heparin Indication: DVT  No Known Allergies  Patient Measurements: Height: 5\' 4"  (162.6 cm) Weight: 63 kg (139 lb) IBW/kg (Calculated) : 59.2 HEPARIN DW (KG): 63   Vital Signs: Temp: 98.9 F (37.2 C) (02/23 0453) Temp Source: Oral (02/23 0453) BP: 116/77 (02/23 0453) Pulse Rate: 89 (02/23 0453)  Labs: Recent Labs    05/21/23 0148 05/21/23 1734 05/21/23 2100 05/22/23 0418 05/22/23 0831 05/22/23 1724 05/23/23 0005 05/23/23 0537  HGB 12.3*  --   --  10.9*  --   --   --  9.9*  HCT 36.9*  --   --  31.6*  --   --   --  29.1*  PLT 169  --   --  123*  --   --   --  97*  HEPARINUNFRC  --   --    < > >1.10*   < > 0.32 0.39 0.35  CREATININE 1.14  --   --  1.13  --   --   --   --   TROPONINIHS  --  <2  --   --   --   --   --   --    < > = values in this interval not displayed.    Estimated Creatinine Clearance: 62.6 mL/min (by C-G formula based on SCr of 1.13 mg/dL).   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   Substance induced mood disorder (HCC) 11/05/2016    Medications:  See med rec  Assessment: 53 year old male with past medical history of throat cancer currently undergoing chemotherapy and radiation. Patient brought by EMS after a syncopal episode, Patient had bilateral carotid duplex u/s and showed acute occlusive thrombus throughout the right internal jugular vein. Not on oral anticoagulants prior to admission. Pharmacy asked to start heparin   HL 0.35, therapeutic on 950 units/hr Hgb 12.3> 10.9> 9.9, no overt noted. Notified MD, track and trend  Goal of Therapy:  Heparin level  0.3-0.7 units/ml Monitor platelets by anticoagulation protocol: Yes   Plan:  Continue heparin infusion at a rate of 950 units/hr Check heparin level daily Continue to monitor CBC daily F/U transition to po DOAC  Elder Cyphers, BS Pharm D, BCPS Clinical Pharmacist 05/23/2023,9:29 AM

## 2023-05-23 NOTE — TOC Initial Note (Signed)
 Transition of Care [facility]) - Initial/Assessment Note    Patient Details  Name: [name] MRN: [id number] Date of Birth: 1970/11/09  Transition of Care [facility]) CM/SW Contact:    Armanda Heritage, RN Phone Number: 05/23/2023, 2:06 PM  Clinical Narrative:                 Patient from home with spouse.  Receives [facility] services with [facility] prior to admission as well as enteral feeding supplies/formula from [employer].  Anticipate discharge to home once medically stable.  MD notified of need for resumption of care orders for [facility] at discharge. No SDOH or transportation needs identified.    Expected Discharge Plan: Home w Home Health Services Barriers to Discharge: Continued Medical Work up   Patient Goals and CMS Choice Patient states their goals for this hospitalization and ongoing recovery are:: to go home CMS Medicare.gov Compare Post Acute Care list provided to:: Patient Choice offered to / list presented to : Patient      Expected Discharge Plan and Services     Post Acute Care Choice: Resumption of Svcs/PTA Provider Living arrangements for the past 2 months: Single Family Home                           HH Arranged: RN HH Agency: [facility] Date [facility] Agency Contacted: 05/23/23 Time HH Agency Contacted: [phone] Representative spoke with at [facility] Agency: Laurelyn Sickle  Prior Living Arrangements/Services Living arrangements for the past 2 months: Single Family Home Lives with:: Spouse Patient language and need for interpreter reviewed:: Yes        Need for Family Participation in Patient Care: Yes (Comment) Care giver support system in place?: Yes (comment)   Criminal Activity/Legal Involvement Pertinent to Current Situation/Hospitalization: No - Comment as needed  Activities of Daily Living   ADL Screening (condition at time of admission) Independently performs ADLs?: Yes (appropriate for developmental age) Is the patient deaf or have difficulty hearing?:  No Does the patient have difficulty seeing, even when wearing glasses/contacts?: No Does the patient have difficulty concentrating, remembering, or making decisions?: No  Permission Sought/Granted                  Emotional Assessment Appearance:: Appears stated age Attitude/Demeanor/Rapport: Engaged Affect (typically observed): Accepting Orientation: : Oriented to Self, Oriented to Place, Oriented to  Time, Oriented to Situation   Psych Involvement: No (comment)  Admission diagnosis:  Syncope [R55] Hypotension, unspecified hypotension type [I95.9] Syncope, unspecified syncope type [R55] Patient Active Problem List   Diagnosis Date Noted   CAP (community acquired pneumonia) 05/22/2023   Sepsis due to pneumonia (HCC) 05/22/2023   Syncope 05/21/2023   Acute DVT of Right IJV- acute occlusive thrombus throughout the right internal jugular vein. 05/21/2023   Internal jugular (IJ) vein thromboembolism, acute, right (/Acute DVT of Right IJV- acute occlusive thrombus throughout the right internal jugular vein. 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   Left ear pain 03/10/2023   Oropharyngeal dysphagia 03/10/2023   Essential hypertension 02/04/2023   GAD (generalized anxiety disorder) 02/04/2023   Otitis media  02/04/2023   Cocaine use disorder, severe, in sustained remission (HCC)    Major depressive disorder, recurrent episode, severe, 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   Chronic hepatitis C virus infection (HCC) G1a 12/28/2016   GERD (gastroesophageal reflux disease) 12/10/2016   Encounter for  antineoplastic chemotherapy 12/10/2016   Alcohol use disorder, severe, dependence in unclear remission status    PCP:  [name], MD Pharmacy:   [facility] [phone] - [place], [place] - [address] AT [facility] OF [address] & [address] [place] [address] Phone: [phone] Fax: [phone]  [facility] [place], [place] - [address] [place] [address] Phone: [phone] Fax: [phone]     Social Drivers of Health (SDOH) Social History: SDOH Screenings   Food Insecurity: No Food Insecurity (05/21/2023)  Housing: Low Risk  (05/21/2023)  Transportation Needs: No Transportation Needs (05/21/2023)  Utilities: Not At Risk (05/21/2023)  Alcohol Screen: Medium Risk (06/23/2017)  Depression (PHQ2-9): High Risk (03/29/2023)  Tobacco Use: Medium Risk (05/21/2023)   SDOH Interventions:     Readmission Risk Interventions    05/22/2023    4:02 PM 05/07/2023   11:49 AM  Readmission Risk Prevention Plan  Transportation Screening Complete Complete  PCP or Specialist Appt within 3-5 Days Complete Complete  HRI or Home Care Consult Complete Complete  Social Work Consult for Recovery Care Planning/Counseling Complete Complete  Palliative Care Screening Complete Not Applicable  Medication Review [employer]) Complete Complete

## 2023-05-23 NOTE — Progress Notes (Signed)
 PROGRESS NOTE  [name], is a 53 y.o. male, DOB - July 17, 1970, ZOX:[id number]  Admit date - 05/21/2023   Admitting Physician [name], DO  Outpatient Primary MD for the patient is [name], MD  LOS - 2  Chief Complaint  Patient presents with   Loss of Consciousness      Brief Narrative:   53 y.o. 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, cN1, cM0)) in January 2025 who started radiation on 04/14/2023, chemotherapy with cisplatin on 04/19/2023, s/p Port-A-Cath and G-tube placement 04/28/2023 admitted on 05/21/23 after a syncopal event, and found to have right IJ acute DVT as well as sepsis secondary to pneumonia with persistent fevers    -Assessment and Plan: 1)Syncope-no significant arrhythmia on telemetry monitored unit,  -Ruled out already for ACS by cardiac enzymes and EKG - echocardiogram without significant aortic stenosis or other outflow obstruction, and  EF is 60 to 65%, No segmental/Regional wall motion abnormalities.,  No diastolic dysfunction, mild LVH normal) -Carotid artery Dopplers with incidental finding of--Incidental note is made of acute occlusive thrombus throughout the right internal jugular vein. -No hemodynamically significant carotid artery stenosis   2)Acute DVT----Carotid artery Dopplers with incidental finding of--Incidental note is made of acute occlusive thrombus throughout the right internal jugular vein----in the setting of malignancy and right-sided Port-A-Cath in situ -No hemodynamically significant carotid artery stenosis -- c/n  IV heparin, plan to transition to DOAC at discharge -Echo as noted above #1   3)Sepsis secondary to CAP with persistent fevers febrile illness---- -COVID, flu and RSV negative --CTA  chest without PE, emphysema noted, bronchitis and possible pneumonia --Lactic acid 3.7 repeat 2.3--- received IV fluids  -UA not suggestive of UTI --WBC 3.2 >> 12.1 >>9.9 -c/n Rocephin/azithromycin pending further culture data -Blood cultures and urine cx NGTD -  4)A.m. cortisol is 4.7--- ACTH stimulation test with appropriate response (Cortisol- 16.4 >>21.5 >>22.3) --Patient presented with syncope and hypotension--- BP has stabilized   5)Hypokalemia--- resolved with replacement - magnesium WNL,   6)Squamous cell Carcinoma of the Supraglottics (stage III (cT3, cN1, cM0)) in January 2025  --started radiation on 04/14/2023, chemotherapy with cisplatin on 04/19/2023,  He gets radiation Monday through Friday and takes chemo on Mondays. Last seen by primary oncologist--Dr. [name] on 05/14/23 -Discussed with the primary oncologist via secure chat   7)Dysphagia/Odynophagia--- due to supraglottic squamous cell carcinoma and subsequent radiotherapy--- --dietitian consult for adjustment of PEG tube feeding appreciated -Patient has had poor tolerance to bolus feeding dietitian recommends continues to fluid with Osmolite 1.5, with plans to transition to bolus feeding later if patient tolerates continuous feeding at goal rate --Tolerating Tube feeding well -@ Goal   Status is: Inpatient   Disposition: The patient is from: Home              Anticipated d/c is to: Home              Anticipated d/c date is: 2 days              Patient currently is not medically stable to d/c. Barriers: Not Clinically Stable-   Code Status :  -  Code Status: Full Code   Family Communication:     (patient is alert, awake and coherent) Discussed with wife at bedside  DVT Prophylaxis  :   -  SCDs /Iv Heparin  SCDs Start: 05/21/23 6962 Place TED hose Start: 05/21/23 0822   Lab Results  Component Value Date   PLT 97 (L) 05/23/2023    Inpatient Medications  Scheduled Meds:  Chlorhexidine Gluconate Cloth   6 each Topical Daily   famotidine  40 mg Per Tube Daily   FLUoxetine  20 mg Per Tube Daily   free water  200 mL Per Tube Q4H   guaiFENesin-dextromethorphan  15 mL Per Tube Q6H WA   metoprolol succinate  25 mg Oral Daily   QUEtiapine  50 mg Per Tube QHS   sodium chloride flush  10-40 mL Intracatheter Q12H   sodium chloride flush  3 mL Intravenous Q12H   sodium chloride flush  3 mL Intravenous Q12H   Continuous Infusions:  azithromycin 500 mg (05/23/23 1235)   cefTRIAXone (ROCEPHIN)  IV 2 g (05/23/23 1138)   feeding supplement (OSMOLITE 1.5 CAL) 1,000 mL (05/22/23 1757)   heparin 950 Units/hr (05/23/23 1231)   PRN Meds:.acetaminophen **OR** acetaminophen, bisacodyl, hydrOXYzine, ondansetron **OR** ondansetron (ZOFRAN) IV, polyethylene glycol, sodium chloride flush, sodium chloride flush   Anti-infectives (From admission, onward)    Start     Dose/Rate Route Frequency Ordered Stop   05/22/23 1215  cefTRIAXone (ROCEPHIN) 2 g in sodium chloride 0.9 % 100 mL IVPB        2 g 200 mL/hr over 30 Minutes Intravenous Every 24 hours 05/22/23 1115     05/22/23 1215  azithromycin (ZITHROMAX) 500 mg in sodium chloride 0.9 % 250 mL IVPB        500 mg 250 mL/hr over 60 Minutes Intravenous Every 24 hours 05/22/23 1115          Subjective: [name] today has no emesis,  No chest pain,    T max 103, T current 98.9 -No productive cough Tolerating tube feeding well- Had mushy BM   Objective: Vitals:   05/22/23 1720 05/22/23 1934 05/23/23 0453 05/23/23 1113  BP:  117/74 116/77 (!) 142/72  Pulse:  80 89   Resp:  20 20   Temp: (!) 103 F (39.4 C) 99.7 F (37.6 C) 98.9 F (37.2 C)   TempSrc: Oral Oral Oral   SpO2:  98% 100%   Weight:      Height:        Intake/Output Summary (Last 24 hours) at 05/23/2023 1406 Last data filed at 05/23/2023 0400 Gross per 24 hour  Intake 1925.15 ml  Output --  Net 1925.15 ml   Filed Weights   05/21/23 0102 05/21/23 1648  Weight: 68 kg 63 kg     Physical Exam Gen:- Awake Alert, chronically ill-appearing, no acute distress  HEENT:- [place].AT, No sclera icterus Neck-Supple Neck,No JVD, radiation injury to skin of neck area Lungs-  CTAB , fair air movement bilaterally  CV- S1, S2 normal, RRR, right-sided Port-A-Cath in situ--does not appear infected Abd-  +ve B.Sounds, Abd Soft, No tenderness, PEG tube site is clean dry and intact Extremity/Skin:- No  edema, pedal pulses present  Psych-affect is appropriate, oriented x3 Neuro-generalized weakness, no new focal deficits, no tremors  Data Reviewed: I have personally reviewed following labs and imaging studies  CBC: Recent Labs  Lab 05/21/23 0148 05/22/23 0418 05/23/23 0537  WBC 3.2* 12.1* 9.9  NEUTROABS 2.0  --   --   HGB 12.3* 10.9* 9.9*  HCT 36.9* 31.6* 29.1*  MCV 89.6 88.3 88.4  PLT 169 123* 97*   Basic Metabolic Panel: Recent Labs  Lab 05/21/23 0148 05/21/23 0824 05/22/23 0418  NA 137  --  135  K 2.9*  --  3.7  CL 101  --  107  CO2 22  --  22  GLUCOSE 104*  --  108*  BUN 13  --  15  CREATININE 1.14  --  1.13  CALCIUM 8.9  --  8.1*  MG  --  2.2  --    GFR: Estimated Creatinine Clearance: 62.6 mL/min (by C-G formula based on SCr of 1.13 mg/dL). Liver Function Tests: Recent Labs  Lab 05/21/23 0148 05/22/23 0418  AST 14* 14*  ALT 14 13  ALKPHOS 44 41  BILITOT 0.6 0.3  PROT 7.2 6.0*  ALBUMIN 3.4* 2.8*   Recent Results (from the past 240 hours)  Urine Culture     Status: None   Collection Time: 05/21/23  1:37 AM   Specimen: Urine, Clean Catch  Result Value Ref Range Status   Specimen Description   Final    URINE, CLEAN CATCH Performed at [facility], [address], [place], [place] [address]    Special Requests   Final    Normal Performed at [facility], [address] [place], [place] [address]    Culture   Final    NO GROWTH Performed at [facility], [address] [place], [place] [address]    Report Status 05/22/2023 FINAL   Final  Resp panel by RT-PCR (RSV, Flu A&B, Covid) Anterior Nasal Swab     Status: None   Collection Time: 05/21/23  1:48 AM   Specimen: Anterior Nasal Swab  Result Value Ref Range Status   SARS Coronavirus 2 by RT PCR NEGATIVE NEGATIVE Final    Comment: (NOTE) SARS-CoV-2 target nucleic acids are NOT DETECTED.  The SARS-CoV-2 RNA is generally detectable in upper respiratory specimens during the acute phase of infection. The lowest concentration of SARS-CoV-2 viral copies this assay can detect is 138 copies/mL. A negative result does not preclude SARS-Cov-2 infection and should not be used as the sole basis for treatment or other patient management decisions. A negative result may occur with  improper specimen collection/handling, submission of specimen other than nasopharyngeal swab, presence of viral mutation(s) within the areas targeted by this assay, and inadequate number of viral copies(<138 copies/mL). A negative result must be combined with clinical observations, patient history, and epidemiological information. The expected result is Negative.  Fact Sheet for Patients:  BloggerCourse.com  Fact Sheet for Healthcare Providers:  SeriousBroker.it  This test is no t yet approved or cleared by the [place] FDA and  has been authorized for detection and/or diagnosis of SARS-CoV-2 by FDA under an Emergency Use Authorization (EUA). This EUA will remain  in effect (meaning this test can be used) for the duration of the COVID-19 declaration under Section 564(b)(1) of the Act, 21 U.S.C.section 360bbb-3(b)(1), unless the authorization is terminated  or revoked sooner.       Influenza A by PCR NEGATIVE NEGATIVE Final   Influenza B by PCR NEGATIVE NEGATIVE Final    Comment: (NOTE) The Xpert Xpress SARS-CoV-2/FLU/RSV plus assay is intended as an aid in the diagnosis of influenza from Nasopharyngeal swab specimens and should not be  used as a sole basis for treatment. Nasal washings and aspirates are unacceptable for Xpert Xpress SARS-CoV-2/FLU/RSV testing.  Fact Sheet for Patients: BloggerCourse.com  Fact Sheet for Healthcare Providers: SeriousBroker.it  This test is not yet approved or cleared by the [place] FDA and has  been authorized for detection and/or diagnosis of SARS-CoV-2 by FDA under an Emergency Use Authorization (EUA). This EUA will remain in effect (meaning this test can be used) for the duration of the COVID-19 declaration under Section 564(b)(1) of the Act, 21 U.S.C. section 360bbb-3(b)(1), unless the authorization is terminated or revoked.     Resp Syncytial Virus by PCR NEGATIVE NEGATIVE Final    Comment: (NOTE) Fact Sheet for Patients: BloggerCourse.com  Fact Sheet for Healthcare Providers: SeriousBroker.it  This test is not yet approved or cleared by the [place] FDA and has been authorized for detection and/or diagnosis of SARS-CoV-2 by FDA under an Emergency Use Authorization (EUA). This EUA will remain in effect (meaning this test can be used) for the duration of the COVID-19 declaration under Section 564(b)(1) of the Act, 21 U.S.C. section 360bbb-3(b)(1), unless the authorization is terminated or revoked.  Performed at [facility], [address], [place], [place] [address]   Culture, blood (routine x 2)     Status: None (Preliminary result)   Collection Time: 05/21/23  2:07 AM   Specimen: BLOOD  Result Value Ref Range Status   Specimen Description BLOOD PORTA CATH  Final   Special Requests   Final    BOTTLES DRAWN AEROBIC AND ANAEROBIC Blood Culture adequate volume   Culture   Final    NO GROWTH 2 DAYS Performed at [facility], [address], [place], [place] [address]    Report Status PENDING  Incomplete  Culture, blood (routine x 2)     Status: None (Preliminary  result)   Collection Time: 05/21/23  2:07 AM   Specimen: BLOOD  Result Value Ref Range Status   Specimen Description BLOOD LEFT ANTECUBITAL  Final   Special Requests   Final    BOTTLES DRAWN AEROBIC AND ANAEROBIC Blood Culture adequate volume   Culture   Final    NO GROWTH 2 DAYS Performed at [facility], [address], [place], [place] [address]    Report Status PENDING  Incomplete    Radiology Studies: ECHOCARDIOGRAM COMPLETE Result Date: 05/22/2023    ECHOCARDIOGRAM REPORT   Patient Name:   [name] Date of Exam: 05/22/2023 Medical Rec #:  [id number]      Height:       64.0 in Accession #:    [id number]     Weight:       139.0 lb Date of Birth:  1970-08-05     BSA:          1.676 m Patient Age:    52 years       BP:           136/85 mmHg Patient Gender: M              HR:           72 bpm. [facility] Procedure: 2D Echo, 3D Echo, Cardiac Doppler, Color Doppler and Strain Analysis            (Both Spectral and Color Flow Doppler were utilized during            procedure). Indications:    Syncope  History:        Patient has prior history of Echocardiogram examinations, most                 recent 12/24/2016. Risk Factors:Hypertension and Former Smoker.  Sonographer:    Karma Ganja Referring Phys: [id number] Torryn Hudspeth  Sonographer Comments: Global longitudinal  strain was attempted. IMPRESSIONS  1. Left ventricular ejection fraction, by estimation, is 60 to 65%. The left ventricle has normal function. The left ventricle has no regional wall motion abnormalities. Left ventricular diastolic parameters were normal. The average left ventricular global longitudinal strain is -18.2 %. The global longitudinal strain is normal.  2. Right ventricular systolic function is normal. The right ventricular size is normal.  3. The mitral valve is normal in structure. No evidence of mitral valve regurgitation. No evidence of mitral stenosis.  4. The aortic valve is normal in structure. Aortic  valve regurgitation is not visualized. No aortic stenosis is present.  5. The inferior vena cava is normal in size with greater than 50% respiratory variability, suggesting right atrial pressure of 3 mmHg. FINDINGS  Left Ventricle: Left ventricular ejection fraction, by estimation, is 60 to 65%. The left ventricle has normal function. The left ventricle has no regional wall motion abnormalities. The average left ventricular global longitudinal strain is -18.2 %. Strain was performed and the global longitudinal strain is normal. 3D ejection fraction reviewed and evaluated as part of the interpretation. Alternate measurement of EF is felt to be most reflective of LV function. The left ventricular internal cavity size was normal in size. There is no left ventricular hypertrophy. Left ventricular diastolic parameters were normal. Right Ventricle: The right ventricular size is normal. No increase in right ventricular wall thickness. Right ventricular systolic function is normal. Left Atrium: Left atrial size was normal in size. Right Atrium: Right atrial size was normal in size. Pericardium: There is no evidence of pericardial effusion. Mitral Valve: The mitral valve is normal in structure. No evidence of mitral valve regurgitation. No evidence of mitral valve stenosis. Tricuspid Valve: The tricuspid valve is normal in structure. Tricuspid valve regurgitation is not demonstrated. No evidence of tricuspid stenosis. Aortic Valve: The aortic valve is normal in structure. Aortic valve regurgitation is not visualized. No aortic stenosis is present. Aortic valve mean gradient measures 6.0 mmHg. Aortic valve peak gradient measures 11.0 mmHg. Aortic valve area, by VTI measures 2.14 cm. Pulmonic Valve: The pulmonic valve was not well visualized. Aorta: The aortic root is normal in size and structure. Venous: The inferior vena cava is normal in size with greater than 50% respiratory variability, suggesting right atrial pressure  of 3 mmHg. IAS/Shunts: No atrial level shunt detected by color flow Doppler. Additional Comments: 3D was performed not requiring image post processing on an independent workstation and was indeterminate.  LEFT VENTRICLE PLAX 2D LVIDd:         4.30 cm   Diastology LVIDs:         2.90 cm   LV e' medial:    9.79 cm/s LV PW:         0.80 cm   LV E/e' medial:  9.0 LV IVS:        0.90 cm   LV e' lateral:   15.80 cm/s LVOT diam:     1.90 cm   LV E/e' lateral: 5.6 LV SV:         62 LV SV Index:   37        2D Longitudinal Strain LVOT Area:     2.84 cm  2D Strain GLS Avg:     -18.2 %                           3D Volume EF:  3D EF:        52 %                          LV EDV:       182 ml                          LV ESV:       87 ml                          LV SV:        95 ml RIGHT VENTRICLE RV Basal diam:  4.10 cm RV S prime:     17.60 cm/s TAPSE (M-mode): 2.6 cm LEFT ATRIUM             Index        RIGHT ATRIUM           Index LA diam:        2.90 cm 1.73 cm/m   RA Area:     10.20 cm LA Vol (A2C):   65.5 ml 39.08 ml/m  RA Volume:   19.20 ml  11.46 ml/m LA Vol (A4C):   28.2 ml 16.82 ml/m LA Biplane Vol: 43.0 ml 25.66 ml/m  AORTIC VALVE AV Area (Vmax):    2.03 cm AV Area (Vmean):   2.10 cm AV Area (VTI):     2.14 cm AV Vmax:           166.00 cm/s AV Vmean:          106.000 cm/s AV VTI:            0.290 m AV Peak Grad:      11.0 mmHg AV Mean Grad:      6.0 mmHg LVOT Vmax:         119.00 cm/s LVOT Vmean:        78.400 cm/s LVOT VTI:          0.219 m LVOT/AV VTI ratio: 0.76  AORTA Ao Root diam: 3.10 cm MITRAL VALVE MV Area (PHT): 4.06 cm    SHUNTS MV Decel Time: 187 msec    Systemic VTI:  0.22 m MV E velocity: 87.80 cm/s  Systemic Diam: 1.90 cm MV A velocity: 75.00 cm/s MV E/A ratio:  1.17 Mihai Croitoru MD Electronically signed by Thurmon Fair MD Signature Date/Time: 05/22/2023/12:49:20 PM    Final    Scheduled Meds:  Chlorhexidine Gluconate Cloth  6 each Topical Daily   famotidine  40  mg Per Tube Daily   FLUoxetine  20 mg Per Tube Daily   free water  200 mL Per Tube Q4H   guaiFENesin-dextromethorphan  15 mL Per Tube Q6H WA   metoprolol succinate  25 mg Oral Daily   QUEtiapine  50 mg Per Tube QHS   sodium chloride flush  10-40 mL Intracatheter Q12H   sodium chloride flush  3 mL Intravenous Q12H   sodium chloride flush  3 mL Intravenous Q12H   Continuous Infusions:  azithromycin 500 mg (05/23/23 1235)   cefTRIAXone (ROCEPHIN)  IV 2 g (05/23/23 1138)   feeding supplement (OSMOLITE 1.5 CAL) 1,000 mL (05/22/23 1757)   heparin 950 Units/hr (05/23/23 1231)    LOS: 2 days   Shon Hale M.D on 05/23/2023 at 2:06 PM  Go to [email or web address] - for contact info  [facility] - Office  [phone]  If  7PM-7AM, please contact night-coverage [email or web address] 05/23/2023, 2:06 PM

## 2023-05-23 NOTE — Plan of Care (Signed)
   Problem: Education: Goal: Knowledge of General Education information will improve Description: Including pain rating scale, medication(s)/side effects and non-pharmacologic comfort measures Outcome: Progressing   Problem: Activity: Goal: Risk for activity intolerance will decrease Outcome: Progressing   Problem: Nutrition: Goal: Adequate nutrition will be maintained Outcome: Progressing

## 2023-05-24 ENCOUNTER — Inpatient Hospital Stay: Payer: Medicare HMO

## 2023-05-24 ENCOUNTER — Ambulatory Visit: Payer: Medicare HMO

## 2023-05-24 ENCOUNTER — Ambulatory Visit: Admission: RE | Admit: 2023-05-24 | Payer: Medicare HMO | Source: Ambulatory Visit

## 2023-05-24 ENCOUNTER — Other Ambulatory Visit (HOSPITAL_COMMUNITY): Payer: Self-pay

## 2023-05-24 ENCOUNTER — Inpatient Hospital Stay: Payer: Medicare HMO | Admitting: Nurse Practitioner

## 2023-05-24 ENCOUNTER — Telehealth (HOSPITAL_COMMUNITY): Payer: Self-pay | Admitting: Pharmacy Technician

## 2023-05-24 ENCOUNTER — Encounter: Payer: Self-pay | Admitting: Oncology

## 2023-05-24 DIAGNOSIS — I82C11 Acute embolism and thrombosis of right internal jugular vein: Secondary | ICD-10-CM | POA: Diagnosis not present

## 2023-05-24 LAB — CBC
HCT: 27.2 % — ABNORMAL LOW (ref 39.0–52.0)
Hemoglobin: 9.2 g/dL — ABNORMAL LOW (ref 13.0–17.0)
MCH: 29.9 pg (ref 26.0–34.0)
MCHC: 33.8 g/dL (ref 30.0–36.0)
MCV: 88.3 fL (ref 80.0–100.0)
Platelets: 91 10*3/uL — ABNORMAL LOW (ref 150–400)
RBC: 3.08 MIL/uL — ABNORMAL LOW (ref 4.22–5.81)
RDW: 13.2 % (ref 11.5–15.5)
WBC: 6.2 10*3/uL (ref 4.0–10.5)
nRBC: 0 % (ref 0.0–0.2)

## 2023-05-24 LAB — HEPARIN LEVEL (UNFRACTIONATED)
Heparin Unfractionated: 0.1 [IU]/mL — ABNORMAL LOW (ref 0.30–0.70)
Heparin Unfractionated: 0.22 [IU]/mL — ABNORMAL LOW (ref 0.30–0.70)
Heparin Unfractionated: 0.2 [IU]/mL — ABNORMAL LOW (ref 0.30–0.70)

## 2023-05-24 MED ORDER — METOPROLOL TARTRATE 25 MG PO TABS
12.5000 mg | ORAL_TABLET | Freq: Two times a day (BID) | ORAL | Status: DC
  Administered 2023-05-25 – 2023-05-27 (×5): 12.5 mg via ORAL
  Filled 2023-05-24 (×6): qty 1

## 2023-05-24 MED ORDER — OXYCODONE HCL 5 MG PO TABS
5.0000 mg | ORAL_TABLET | ORAL | Status: DC | PRN
  Administered 2023-05-24 – 2023-05-27 (×10): 5 mg via ORAL
  Filled 2023-05-24 (×10): qty 1

## 2023-05-24 MED ORDER — HEPARIN BOLUS VIA INFUSION
1000.0000 [IU] | Freq: Once | INTRAVENOUS | Status: AC
  Administered 2023-05-24: 1000 [IU] via INTRAVENOUS
  Filled 2023-05-24: qty 1000

## 2023-05-24 MED ORDER — HEPARIN BOLUS VIA INFUSION
2000.0000 [IU] | Freq: Once | INTRAVENOUS | Status: AC
  Administered 2023-05-24: 2000 [IU] via INTRAVENOUS
  Filled 2023-05-24: qty 2000

## 2023-05-24 NOTE — Progress Notes (Signed)
 PHARMACY - ANTICOAGULATION CONSULT NOTE  Pharmacy Consult for Heparin Indication: DVT  No Known Allergies  Patient Measurements: Height: 5\' 4"  (162.6 cm) Weight: 63 kg (139 lb) IBW/kg (Calculated) : 59.2 HEPARIN DW (KG): 63   Vital Signs: BP: 100/63 (02/24 1327) Pulse Rate: 77 (02/24 1327)  Labs: Recent Labs    05/21/23 1734 05/21/23 2100 05/22/23 0418 05/22/23 0831 05/23/23 0537 05/24/23 0535 05/24/23 1434  HGB  --    < > 10.9*  --  9.9* 9.2*  --   HCT  --   --  31.6*  --  29.1* 27.2*  --   PLT  --   --  123*  --  97* 91*  --   HEPARINUNFRC  --    < > >1.10*   < > 0.35 0.10* 0.22*  CREATININE  --   --  1.13  --   --   --   --   TROPONINIHS <2  --   --   --   --   --   --    < > = values in this interval not displayed.    Estimated Creatinine Clearance: 62.6 mL/min (by C-G formula based on SCr of 1.13 mg/dL).   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   Substance induced mood disorder (HCC) 11/05/2016    Medications:  See med rec  Assessment: 53 year old male with past medical history of throat cancer currently undergoing chemotherapy and radiation. Patient brought by EMS after a syncopal episode, Patient had bilateral carotid duplex u/s and showed acute occlusive thrombus throughout the right internal jugular vein. Not on oral anticoagulants prior to admission. Pharmacy asked to start heparin   HL 0.10> 0.22, subtherapeutic on 1100 units/hr Hgb 12.3> 10.9> 9.9> 9.2 , no overt noted. Notified MD, track and trend  Goal of Therapy:  Heparin level 0.3-0.7 units/ml Monitor platelets by anticoagulation protocol: Yes   Plan:  Heparin bolus 1000  units, Increase heparin infusion to 1250 units/hr Check heparin level in ~ 6 hours, and daily Continue to monitor CBC daily F/U transition to po DOAC  Elder Cyphers, BS Pharm D, BCPS Clinical Pharmacist 05/24/2023,4:52 PM

## 2023-05-24 NOTE — Progress Notes (Deleted)
 Patient Care Team: [name], MD as PCP - General (Internal Medicine) West Bali, MD (Inactive) as Consulting Physician (Gastroenterology) Lonie Peak, MD as Attending Physician (Radiation Oncology) Malmfelt, Lise Auer, RN as Oncology Nurse Navigator Meryl Crutch, MD as Consulting Physician (Oncology) Karle Barr, MD as Referring Physician (Otolaryngology)   CHIEF COMPLAINT:   Oncology History  Malignant neoplasm of supraglottis [facility])  03/22/2023 Initial Diagnosis   Malignant neoplasm of supraglottis (HCC)   03/26/2023 Cancer Staging   Staging form: Larynx - Supraglottis, AJCC 8th Edition - Clinical stage from 03/26/2023: Stage III (cT3, cN1, cM0) - Signed by Lonie Peak, MD on 04/14/2023 Stage prefix: Initial diagnosis   04/19/2023 -  Chemotherapy   Patient is on Treatment Plan : HEAD/NECK Cisplatin (40) q7d        CURRENT THERAPY:   INTERVAL HISTORY   ROS   Past Medical History:  Diagnosis Date   Anxiety    Bipolar 1 disorder (HCC)    EtOH dependence (HCC)    QUIT   GERD without esophagitis    Hepatitis C carrier (HCC)    Hypercholesteremia    Hypertension    laryngeal ca 02/2023   Polysubstance dependence (HCC)    COCAINE- quit   Stroke [facility])    has some ST memory deficits from stroke.   Substance induced mood disorder (HCC) 11/05/2016     Past Surgical History:  Procedure Laterality Date   ANEURYSM COILING     AGE 44 x2   APPENDECTOMY     BIOPSY  01/05/2017   Procedure: BIOPSY;  Surgeon: West Bali, MD;  [facility]: [facility];  Service: Endoscopy;;  Duodenal and Gastric   COLONOSCOPY WITH PROPOFOL N/A 01/05/2017   Procedure: COLONOSCOPY WITH PROPOFOL;  Surgeon: West Bali, MD;  Location: [facility];  Service: Endoscopy;  Laterality: N/A;  10:00am   DIRECT LARYNGOSCOPY Left 03/16/2023   Procedure: Direct laryngoscopy with biopsy of supraglottic mass;  Surgeon: Newman Pies, MD;  Location: [facility];   Service: ENT;  Laterality: Left;   ESOPHAGOGASTRODUODENOSCOPY (EGD) WITH PROPOFOL N/A 01/05/2017   Procedure: ESOPHAGOGASTRODUODENOSCOPY (EGD) WITH PROPOFOL;  Surgeon: West Bali, MD;  [facility]: [facility];  Service: Endoscopy;  Laterality: N/A;   IR GASTROSTOMY TUBE MOD SED  04/28/2023   IR IMAGING GUIDED PORT INSERTION  04/28/2023   IR PATIENT EVAL TECH 0-60 MINS  05/03/2023   LAPAROSCOPIC APPENDECTOMY     MASS EXCISION Right 01/13/2017   Procedure: EXCISION LIPOMA RIGHT ARM;  Surgeon: Lucretia Roers, MD;  Location: [facility];  Service: General;  Laterality: Right;     Facility-Administered Encounter Medications as of 05/24/2023  Medication   acetaminophen (TYLENOL) tablet 650 mg   Or   acetaminophen (TYLENOL) suppository 650 mg   azithromycin (ZITHROMAX) 500 mg in sodium chloride 0.9 % 250 mL IVPB   bisacodyl (DULCOLAX) suppository 10 mg   cefTRIAXone (ROCEPHIN) 2 g in sodium chloride 0.9 % 100 mL IVPB   Chlorhexidine Gluconate Cloth 2 % PADS 6 each   famotidine (PEPCID) tablet 40 mg   feeding supplement (OSMOLITE 1.5 CAL) liquid 1,000 mL   FLUoxetine (PROZAC) capsule 20 mg   free water 200 mL   guaiFENesin-dextromethorphan (ROBITUSSIN DM) 100-10 MG/5ML syrup 15 mL   heparin ADULT infusion 100 units/mL (25000 units/218mL)   HYDROmorphone (DILAUDID) injection 0.5 mg   hydrOXYzine (ATARAX) tablet 25 mg   metoprolol succinate (TOPROL-XL) 24 hr tablet 25 mg  ondansetron (ZOFRAN) tablet 4 mg   Or   ondansetron (ZOFRAN) injection 4 mg   polyethylene glycol (MIRALAX / GLYCOLAX) packet 17 g   QUEtiapine (SEROQUEL) tablet 50 mg   sodium chloride flush (NS) 0.9 % injection 10-40 mL   sodium chloride flush (NS) 0.9 % injection 10-40 mL   sodium chloride flush (NS) 0.9 % injection 3 mL   sodium chloride flush (NS) 0.9 % injection 3 mL   sodium chloride flush (NS) 0.9 % injection 3 mL   Outpatient Encounter Medications as of 05/24/2023  Medication Sig Note   acetaminophen (TYLENOL)  500 MG tablet Take 1,000 mg by mouth every 6 (six) hours as needed for mild pain (pain score 1-3) or moderate pain (pain score 4-6).    amlodipine-olmesartan (AZOR) 10-20 MG tablet TAKE 1 TABLET BY MOUTH DAILY    dexamethasone (DECADRON) 4 MG tablet Take 2 tablets (8 mg) by mouth daily x 3 days starting the day after cisplatin chemotherapy. Take with food.    famotidine (PEPCID) 40 MG tablet Take 1 tablet (40 mg total) by mouth daily. 05/21/2023: Dispense records do not support compliance. Had bottle at bedside with medication inside bottle.   FLUoxetine (PROZAC) 20 MG capsule Take 1 capsule (20 mg total) by mouth daily. 05/21/2023: Dispense records do not support compliance. Had bottle at bedside with medication inside bottle.   hydrOXYzine (ATARAX) 25 MG tablet Take 1 tablet (25 mg total) by mouth 3 (three) times daily as needed for anxiety. (Patient taking differently: Take 25 mg by mouth at bedtime.)    lidocaine (XYLOCAINE) 2 % solution Patient: Mix 1part 2% viscous lidocaine, 1part H20. Swallow 10mL of diluted mixture, before meals and at bedtime, up to QID    lidocaine-prilocaine (EMLA) cream Apply to affected area once    metoprolol succinate (TOPROL-XL) 25 MG 24 hr tablet TAKE 1 TABLET(25 MG) BY MOUTH DAILY 05/21/2023: Dispense records do not support compliance. Had bottle at bedside with medication inside bottle.   nicotine (NICODERM CQ - DOSED IN MG/24 HOURS) 21 mg/24hr patch Place 1 patch (21 mg total) onto the skin daily. Apply 21 mg patch daily x 6 wk, then 14mg  patch daily x 2 wk, then 7 mg patch daily x 2 wk (Patient not taking: Reported on 05/21/2023)    Nutritional Supplements (FEEDING SUPPLEMENT, OSMOLITE 1.5 CAL,) LIQD Place 60 mL/hr into feeding tube continuous. 7 Cartons of Osmolite 1.5 per day + an additional free water from flushes For long-term (Patient taking differently: Place 60 mL/hr into feeding tube continuous. 4 Cartons of Osmolite 1.5 per day + an additional  free water from flushes For long-term)    ondansetron (ZOFRAN) 8 MG tablet Take 1 tablet (8 mg total) by mouth every 8 (eight) hours as needed for nausea or vomiting. Start on the third day after cisplatin. 05/21/2023: Has if needed. Has not had to take.    prochlorperazine (COMPAZINE) 10 MG tablet TAKE 1 TABLET(10 MG) BY MOUTH EVERY 6 HOURS AS NEEDED FOR NAUSEA OR VOMITING 05/21/2023: Has if needed. Has not had to take.    Protein (FEEDING SUPPLEMENT, PROSOURCE TF20,) liquid Place 60 mLs into feeding tube daily. (Patient not taking: Reported on 05/21/2023)    QUEtiapine (SEROQUEL) 200 MG tablet Take 1 tablet (200 mg total) by mouth at bedtime. (Patient not taking: Reported on 05/21/2023)    QUEtiapine (SEROQUEL) 50 MG tablet Take one pill nightly for 1 week. Then increase to 2 pills nightly for 1  week. Then increase to 3 pills nightly for one week. Then increase to 4 pills nightly for one week. (Patient taking differently: Take 50 mg by mouth at bedtime. Take one pill nightly for 1 week. Then increase to 2 pills nightly for 1 week. Then increase to 3 pills nightly for one week. Then increase to 4 pills nightly for one week.) 05/21/2023: Confirmed taking 50mg  at bedtime. Does not plan on tapering up as of right now.    scopolamine (TRANSDERM-SCOP) 1 MG/3DAYS Place 1 patch (1.5 mg total) onto the skin every 3 (three) days. (Patient not taking: Reported on 05/21/2023)      There were no vitals filed for this visit. There is no height or weight on file to calculate BMI.   PHYSICAL EXAM GENERAL:alert, no distress and comfortable SKIN: no rash  EYES: sclera clear NECK: without mass LYMPH:  no palpable cervical or supraclavicular lymphadenopathy  LUNGS: clear with normal breathing effort HEART: regular rate & rhythm, no lower extremity edema ABDOMEN: abdomen soft, non-tender and normal bowel sounds NEURO: alert & oriented x 3 with fluent speech, no focal motor/sensory deficits Breast exam:  PAC  without erythema    CBC    Component Value Date/Time   WBC 9.9 05/23/2023 0537   RBC 3.29 (L) 05/23/2023 0537   HGB 9.9 (L) 05/23/2023 0537   HGB 12.5 (L) 05/14/2023 0935   HGB 14.5 02/04/2023 1354   HCT 29.1 (L) 05/23/2023 0537   HCT 43.5 02/04/2023 1354   PLT 97 (L) 05/23/2023 0537   PLT 291 05/14/2023 0935   PLT 215 02/04/2023 1354   MCV 88.4 05/23/2023 0537   MCV 89 02/04/2023 1354   MCH 30.1 05/23/2023 0537   MCHC 34.0 05/23/2023 0537   RDW 13.2 05/23/2023 0537   RDW 14.0 02/04/2023 1354   LYMPHSABS 0.6 (L) 05/21/2023 0148   LYMPHSABS 2.4 02/04/2023 1354   MONOABS 0.6 05/21/2023 0148   EOSABS 0.0 05/21/2023 0148   EOSABS 0.3 02/04/2023 1354   BASOSABS 0.0 05/21/2023 0148   BASOSABS 0.0 02/04/2023 1354     CMP     Component Value Date/Time   NA 135 05/22/2023 0418   NA 140 02/04/2023 1354   K 3.7 05/22/2023 0418   CL 107 05/22/2023 0418   CO2 22 05/22/2023 0418   GLUCOSE 108 (H) 05/22/2023 0418   BUN 15 05/22/2023 0418   BUN 10 02/04/2023 1354   CREATININE 1.13 05/22/2023 0418   CREATININE 0.90 05/14/2023 0935   CREATININE 0.96 05/04/2017 0910   CALCIUM 8.1 (L) 05/22/2023 0418   PROT 6.0 (L) 05/22/2023 0418   PROT 7.3 02/04/2023 1354   ALBUMIN 2.8 (L) 05/22/2023 0418   ALBUMIN 4.4 02/04/2023 1354   AST 14 (L) 05/22/2023 0418   AST 21 04/05/2023 0806   ALT 13 05/22/2023 0418   ALT 13 04/05/2023 0806   ALKPHOS 41 05/22/2023 0418   BILITOT 0.3 05/22/2023 0418   BILITOT 1.0 04/05/2023 0806   GFRNONAA >60 05/22/2023 0418   GFRNONAA >60 05/14/2023 0935   GFRAA >60 06/22/2017 2301     ASSESSMENT & PLAN:  PLAN:  No orders of the defined types were placed in this encounter.     All questions were answered. The patient knows to call the clinic with any problems, questions or concerns. No barriers to learning were detected. I spent *** counseling the patient face to face. The total time spent in the appointment was *** and more than 50% was on counseling,  review of test results, and coordination of care.   Santiago Glad, NP-C @DATE @

## 2023-05-24 NOTE — Progress Notes (Addendum)
 PROGRESS NOTE  Kaliq Lege, is a 53 y.o. male, DOB - 1971/02/10, IEP:[id number]  Admit date - 05/21/2023   Admitting Physician [name], DO  Outpatient Primary MD for the patient is [name], MD  LOS - 3  Chief Complaint  Patient presents with   Loss of Consciousness      Brief Narrative:   53 y.o. 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, cN1, cM0)) in January 2025 who started radiation on 04/14/2023, chemotherapy with cisplatin on 04/19/2023, s/p Port-A-Cath and G-tube placement 04/28/2023 admitted on 05/21/23 after a syncopal event, and found to have right IJ acute DVT as well as sepsis secondary to pneumonia with persistent fevers    -Assessment and Plan: 1)Syncope-no significant arrhythmia on telemetry monitored unit,  -Ruled out already for ACS by cardiac enzymes and EKG - echocardiogram without significant aortic stenosis or other outflow obstruction, and  EF is 60 to 65%, No segmental/Regional wall motion abnormalities.,  No diastolic dysfunction, mild LVH normal) -Carotid artery Dopplers with incidental finding of--Incidental note is made of acute occlusive thrombus throughout the right internal jugular vein. -No hemodynamically significant carotid artery stenosis BP stable   2)Acute DVT----Carotid artery Dopplers with incidental finding of--Incidental note is made of acute occlusive thrombus throughout the right internal jugular vein----in the setting of malignancy and right-sided Port-A-Cath in situ -No hemodynamically significant carotid artery stenosis -- c/n  IV heparin, hold off on transition to DOAC for now due to Hgb dropping  -Echo as noted above #1 -No hypoxia--- post ambulation O2 sats on room air is 97%   3)Sepsis secondary  to CAP with persistent fevers febrile illness---- -COVID, flu and RSV negative --CTA chest without PE, emphysema noted, bronchitis and possible pneumonia --Lactic acid 3.7 repeat 2.3--- received IV fluids  -UA not suggestive of UTI --WBC 3.2 >> 12.1 >>9.9>>6.2  -c/n Rocephin/azithromycin pending further culture data -Blood cultures and urine cx NGTD --Fever curve trending down -  4)A.m. cortisol is 4.7--- ACTH stimulation test with appropriate response (Cortisol- 16.4 >>21.5 >>22.3) --Patient presented with syncope and hypotension--- BP has stabilized   5)Hypokalemia--- resolved with replacement - magnesium WNL,   6)Squamous cell Carcinoma of the Supraglottics (stage III (cT3, cN1, cM0)) in January 2025  --started radiation on 04/14/2023, chemotherapy with cisplatin on 04/19/2023,  He gets radiation Monday through Friday and takes chemo on Mondays. Last seen by primary oncologist--Dr. [name] on 05/14/23 -Discussed with the primary oncologist via secure chat   7)Dysphagia/Odynophagia--- due to supraglottic squamous cell carcinoma and subsequent radiotherapy--- --dietitian consult for adjustment of PEG tube feeding appreciated -Patient has had poor tolerance to bolus feeding dietitian recommends continues to fluid with Osmolite 1.5, with plans to transition to bolus feeding later if patient tolerates continuous feeding at goal rate --Tolerating Tube feeding well -@ Goal  8)Acute on chronic anemia---Hgb 9.2 from a baseline usually between 11 and 12 -Check stool occult blood -Currently on IV heparin as above #2 hold off on transition to DOAC for now due to Hgb dropping    Status is: Inpatient   Disposition: The patient is from: Home              Anticipated d/c is to: Home              Anticipated d/c date is:  2 days              Patient currently is not medically stable to d/c. Barriers: Not Clinically Stable-   Code Status :  -  Code Status: Full Code   Family  Communication:     (patient is alert, awake and coherent) Discussed with wife at bedside  DVT Prophylaxis  :   - SCDs /Iv Heparin  SCDs Start: 05/21/23 0822 Place TED hose Start: 05/21/23 0822   Lab Results  Component Value Date   PLT 91 (L) 05/24/2023   Inpatient Medications  Scheduled Meds:  Chlorhexidine Gluconate Cloth  6 each Topical Daily   famotidine  40 mg Per Tube Daily   FLUoxetine  20 mg Per Tube Daily   free water  200 mL Per Tube Q4H   guaiFENesin-dextromethorphan  15 mL Per Tube Q6H WA   [START ON 05/25/2023] metoprolol tartrate  12.5 mg Oral BID   QUEtiapine  50 mg Per Tube QHS   sodium chloride flush  10-40 mL Intracatheter Q12H   sodium chloride flush  3 mL Intravenous Q12H   sodium chloride flush  3 mL Intravenous Q12H   Continuous Infusions:  azithromycin 500 mg (05/23/23 1235)   cefTRIAXone (ROCEPHIN)  IV 2 g (05/23/23 1138)   feeding supplement (OSMOLITE 1.5 CAL) 1,000 mL (05/22/23 1757)   heparin 1,100 Units/hr (05/24/23 0917)   PRN Meds:.acetaminophen **OR** acetaminophen, bisacodyl, HYDROmorphone (DILAUDID) injection, hydrOXYzine, ondansetron **OR** ondansetron (ZOFRAN) IV, polyethylene glycol, sodium chloride flush, sodium chloride flush   Anti-infectives (From admission, onward)    Start     Dose/Rate Route Frequency Ordered Stop   05/22/23 1215  cefTRIAXone (ROCEPHIN) 2 g in sodium chloride 0.9 % 100 mL IVPB        2 g 200 mL/hr over 30 Minutes Intravenous Every 24 hours 05/22/23 1115     05/22/23 1215  azithromycin (ZITHROMAX) 500 mg in sodium chloride 0.9 % 250 mL IVPB        500 mg 250 mL/hr over 60 Minutes Intravenous Every 24 hours 05/22/23 1115         Subjective: [name] today has no emesis,  No chest pain,    -Tmax 100.6 -T-current 98.8 -No productive cough -Tolerating tube feeding well -Ambulated with mobility specialist--- post ambulation O2 sats on room air is 97%  Objective: Vitals:   05/23/23 1113 05/23/23 1431  05/23/23 2128 05/24/23 0323  BP: (!) 142/72 127/74 138/78 105/70  Pulse:  75 75 79  Resp:  20 19 18   Temp:  (!) 100.6 F (38.1 C) 100.3 F (37.9 C) 98.8 F (37.1 C)  TempSrc:  Oral Oral Oral  SpO2:  97% 97% 96%  Weight:      Height:       No intake or output data in the 24 hours ending 05/24/23 1044  Filed Weights   05/21/23 0102 05/21/23 1648  Weight: 68 kg 63 kg    Physical Exam Gen:- Awake Alert, chronically ill-appearing, no acute distress  HEENT:- [place].AT, No sclera icterus Neck-Supple Neck,No JVD, radiation injury to skin of neck area Lungs-   fair air movement bilaterally, no wheezing, no rhonchi CV- S1, S2 normal, RRR, right-sided Port-A-Cath in situ--does not appear infected Abd-  +ve B.Sounds, Abd Soft, No tenderness, PEG tube site is clean dry and intact Extremity/Skin:- No  edema, pedal pulses present  Psych-affect is appropriate, oriented x3 Neuro-generalized weakness, no new focal deficits, no tremors  Data Reviewed: I have personally  reviewed following labs and imaging studies  CBC: Recent Labs  Lab 05/21/23 0148 05/22/23 0418 05/23/23 0537 05/24/23 0535  WBC 3.2* 12.1* 9.9 6.2  NEUTROABS 2.0  --   --   --   HGB 12.3* 10.9* 9.9* 9.2*  HCT 36.9* 31.6* 29.1* 27.2*  MCV 89.6 88.3 88.4 88.3  PLT 169 123* 97* 91*   Basic Metabolic Panel: Recent Labs  Lab 05/21/23 0148 05/21/23 0824 05/22/23 0418  NA 137  --  135  K 2.9*  --  3.7  CL 101  --  107  CO2 22  --  22  GLUCOSE 104*  --  108*  BUN 13  --  15  CREATININE 1.14  --  1.13  CALCIUM 8.9  --  8.1*  MG  --  2.2  --    GFR: Estimated Creatinine Clearance: 62.6 mL/min (by C-G formula based on SCr of 1.13 mg/dL). Liver Function Tests: Recent Labs  Lab 05/21/23 0148 05/22/23 0418  AST 14* 14*  ALT 14 13  ALKPHOS 44 41  BILITOT 0.6 0.3  PROT 7.2 6.0*  ALBUMIN 3.4* 2.8*   Recent Results (from the past 240 hours)  Urine Culture     Status: None   Collection Time: 05/21/23  1:37 AM    Specimen: Urine, Clean Catch  Result Value Ref Range Status   Specimen Description   Final    URINE, CLEAN CATCH Performed at [facility], [address] [place], [place] [address]    Special Requests   Final    Normal Performed at [facility], [address], [place], [place] [address]    Culture   Final    NO GROWTH Performed at [facility], [address], [place], [place] [address]    Report Status 05/22/2023 FINAL  Final  Resp panel by RT-PCR (RSV, Flu A&B, Covid) Anterior Nasal Swab     Status: None   Collection Time: 05/21/23  1:48 AM   Specimen: Anterior Nasal Swab  Result Value Ref Range Status   SARS Coronavirus 2 by RT PCR NEGATIVE NEGATIVE Final    Comment: (NOTE) SARS-CoV-2 target nucleic acids are NOT DETECTED.  The SARS-CoV-2 RNA is generally detectable in upper respiratory specimens during the acute phase of infection. The lowest concentration of SARS-CoV-2 viral copies this assay can detect is 138 copies/mL. A negative result does not preclude SARS-Cov-2 infection and should not be used as the sole basis for treatment or other patient management decisions. A negative result may occur with  improper specimen collection/handling, submission of specimen other than nasopharyngeal swab, presence of viral mutation(s) within the areas targeted by this assay, and inadequate number of viral copies(<138 copies/mL). A negative result must be combined with clinical observations, patient history, and epidemiological information. The expected result is Negative.  Fact Sheet for Patients:  BloggerCourse.com  Fact Sheet for Healthcare Providers:  SeriousBroker.it  This test is no t yet approved or cleared by the [place] FDA and  has been authorized for detection and/or diagnosis of SARS-CoV-2 by FDA under an Emergency Use Authorization (EUA). This EUA will remain  in effect (meaning this test can be used) for  the duration of the COVID-19 declaration under Section 564(b)(1) of the Act, 21 U.S.C.section 360bbb-3(b)(1), unless the authorization is terminated  or revoked sooner.       Influenza A by PCR NEGATIVE NEGATIVE Final   Influenza B by PCR NEGATIVE NEGATIVE Final    Comment: (NOTE) The  Xpert Xpress SARS-CoV-2/FLU/RSV plus assay is intended as an aid in the diagnosis of influenza from Nasopharyngeal swab specimens and should not be used as a sole basis for treatment. Nasal washings and aspirates are unacceptable for Xpert Xpress SARS-CoV-2/FLU/RSV testing.  Fact Sheet for Patients: BloggerCourse.com  Fact Sheet for Healthcare Providers: SeriousBroker.it  This test is not yet approved or cleared by the [place] FDA and has been authorized for detection and/or diagnosis of SARS-CoV-2 by FDA under an Emergency Use Authorization (EUA). This EUA will remain in effect (meaning this test can be used) for the duration of the COVID-19 declaration under Section 564(b)(1) of the Act, 21 U.S.C. section 360bbb-3(b)(1), unless the authorization is terminated or revoked.     Resp Syncytial Virus by PCR NEGATIVE NEGATIVE Final    Comment: (NOTE) Fact Sheet for Patients: BloggerCourse.com  Fact Sheet for Healthcare Providers: SeriousBroker.it  This test is not yet approved or cleared by the [place] FDA and has been authorized for detection and/or diagnosis of SARS-CoV-2 by FDA under an Emergency Use Authorization (EUA). This EUA will remain in effect (meaning this test can be used) for the duration of the COVID-19 declaration under Section 564(b)(1) of the Act, 21 U.S.C. section 360bbb-3(b)(1), unless the authorization is terminated or revoked.  Performed at [facility], [address] [place], [place] [address]   Culture, blood (routine x 2)     Status: None (Preliminary  result)   Collection Time: 05/21/23  2:07 AM   Specimen: BLOOD  Result Value Ref Range Status   Specimen Description BLOOD PORTA CATH  Final   Special Requests   Final    BOTTLES DRAWN AEROBIC AND ANAEROBIC Blood Culture adequate volume   Culture   Final    NO GROWTH 2 DAYS Performed at [facility], [address], [place], [place] [address]    Report Status PENDING  Incomplete  Culture, blood (routine x 2)     Status: None (Preliminary result)   Collection Time: 05/21/23  2:07 AM   Specimen: BLOOD  Result Value Ref Range Status   Specimen Description BLOOD LEFT ANTECUBITAL  Final   Special Requests   Final    BOTTLES DRAWN AEROBIC AND ANAEROBIC Blood Culture adequate volume   Culture   Final    NO GROWTH 2 DAYS Performed at [facility], [address], [place], [place] [address]    Report Status PENDING  Incomplete    Radiology Studies: No results found.  Scheduled Meds:  Chlorhexidine Gluconate Cloth  6 each Topical Daily   famotidine  40 mg Per Tube Daily   FLUoxetine  20 mg Per Tube Daily   free water  200 mL Per Tube Q4H   guaiFENesin-dextromethorphan  15 mL Per Tube Q6H WA   [START ON 05/25/2023] metoprolol tartrate  12.5 mg Oral BID   QUEtiapine  50 mg Per Tube QHS   sodium chloride flush  10-40 mL Intracatheter Q12H   sodium chloride flush  3 mL Intravenous Q12H   sodium chloride flush  3 mL Intravenous Q12H   Continuous Infusions:  azithromycin 500 mg (05/23/23 1235)   cefTRIAXone (ROCEPHIN)  IV 2 g (05/23/23 1138)   feeding supplement (OSMOLITE 1.5 CAL) 1,000 mL (05/22/23 1757)   heparin 1,100 Units/hr (05/24/23 0917)    LOS: 3 days   Shon Hale M.D on 05/24/2023 at 10:44 AM  Go to [email or web address] - for contact info  [facility] - Office  [phone]  If 7PM-7AM, please contact  night-coverage [email or web address] 05/24/2023, 10:44 AM

## 2023-05-24 NOTE — Plan of Care (Signed)

## 2023-05-24 NOTE — Plan of Care (Signed)
   Problem: Education: Goal: Knowledge of General Education information will improve Description Including pain rating scale, medication(s)/side effects and non-pharmacologic comfort measures Outcome: Progressing   Problem: Health Behavior/Discharge Planning: Goal: Ability to manage health-related needs will improve Outcome: Progressing

## 2023-05-24 NOTE — Progress Notes (Signed)
 Mobility Specialist Progress Note:    05/24/23 1135  Mobility  Activity Ambulated with assistance in hallway  Level of Assistance Standby assist, set-up cues, supervision of patient - no hands on  Assistive Device None  Distance Ambulated (ft) 200 ft  Range of Motion/Exercises Active;All extremities  Activity Response Tolerated well  Mobility Referral Yes  Mobility visit 1 Mobility  Mobility Specialist Start Time (ACUTE ONLY) 1135  Mobility Specialist Stop Time (ACUTE ONLY) 1155  Mobility Specialist Time Calculation (min) (ACUTE ONLY) 20 min   Pt received in bed, agreeable to mobility. Required supervision to stand and ambulate with no AD. Tolerated well, SpO2 97% on RA throughout session. Returned pt sitting EOB, all needs met.  Arantxa Piercey Mobility Specialist Please contact via Special educational needs teacher or  Rehab office at [phone]

## 2023-05-24 NOTE — Progress Notes (Signed)
 Mobility Specialist Progress Note:    05/24/23 1530  Mobility  Activity Ambulated with assistance in hallway  Level of Assistance Standby assist, set-up cues, supervision of patient - no hands on  Assistive Device None  Distance Ambulated (ft) 300 ft  Range of Motion/Exercises Active;All extremities  Activity Response Tolerated well  Mobility Referral Yes  Mobility visit 1 Mobility  Mobility Specialist Start Time (ACUTE ONLY) 1530  Mobility Specialist Stop Time (ACUTE ONLY) 1555  Mobility Specialist Time Calculation (min) (ACUTE ONLY) 25 min   Pt received sitting EOB. Required supervision to stand and ambulate with no AD. Tolerated well, asx throughout. Returned pt sitting EOB, all needs met.  Sherron Mapp Mobility Specialist Please contact via Special educational needs teacher or  Rehab office at [phone]

## 2023-05-24 NOTE — Telephone Encounter (Signed)
 Patient Product/process development scientist completed.    The patient is insured through [employer]. Patient has Medicare and is not eligible for a copay card, but may be able to apply for patient assistance or Medicare RX Payment Plan (Patient Must reach out to their plan, if eligible for payment plan), if available.    Ran test claim for Eliquis 5 mg and the current 30 day co-pay is $297.00 due to a $250.00 deductible.  Will be $47.00 once deductible is met.   This test claim was processed through [facility]- copay amounts may vary at other pharmacies due to pharmacy/plan contracts, or as the patient moves through the different stages of their insurance plan.     Roland Earl, CPHT Pharmacy Technician III Certified Patient Advocate [facility] Patient Advocate Team Direct Number: [phone]  Fax: [phone]

## 2023-05-24 NOTE — Progress Notes (Signed)
 PHARMACY - ANTICOAGULATION CONSULT NOTE  Pharmacy Consult for Heparin Indication: DVT  No Known Allergies  Patient Measurements: Height: 5\' 4"  (162.6 cm) Weight: 63 kg (139 lb) IBW/kg (Calculated) : 59.2 HEPARIN DW (KG): 63   Vital Signs: Temp: 98.8 F (37.1 C) (02/24 0323) Temp Source: Oral (02/24 0323) BP: 105/70 (02/24 0323) Pulse Rate: 79 (02/24 0323)  Labs: Recent Labs    05/21/23 1734 05/21/23 2100 05/22/23 0418 05/22/23 0831 05/23/23 0005 05/23/23 0537 05/24/23 0535  HGB  --    < > 10.9*  --   --  9.9* 9.2*  HCT  --   --  31.6*  --   --  29.1* 27.2*  PLT  --   --  123*  --   --  97* 91*  HEPARINUNFRC  --    < > >1.10*   < > 0.39 0.35 0.10*  CREATININE  --   --  1.13  --   --   --   --   TROPONINIHS <2  --   --   --   --   --   --    < > = values in this interval not displayed.    Estimated Creatinine Clearance: 62.6 mL/min (by C-G formula based on SCr of 1.13 mg/dL).   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   Substance induced mood disorder (HCC) 11/05/2016    Medications:  See med rec  Assessment: 53 year old male with past medical history of throat cancer currently undergoing chemotherapy and radiation. Patient brought by EMS after a syncopal episode, Patient had bilateral carotid duplex u/s and showed acute occlusive thrombus throughout the right internal jugular vein. Not on oral anticoagulants prior to admission. Pharmacy asked to start heparin   HL 0.10, subtherapeutic on 950 units/hr Hgb 12.3> 10.9> 9.9> 9.2 , no overt noted. Notified MD, track and trend  Goal of Therapy:  Heparin level 0.3-0.7 units/ml Monitor platelets by  anticoagulation protocol: Yes   Plan:  Heparin bolus 2000 units, Increase heparin infusion to 1100 units/hr Check heparin level in ~ 6 hours, and daily Continue to monitor CBC daily F/U transition to po DOAC  Elder Cyphers, BS Pharm D, BCPS Clinical Pharmacist 05/24/2023,7:58 AM

## 2023-05-25 ENCOUNTER — Ambulatory Visit: Payer: Medicare HMO

## 2023-05-25 DIAGNOSIS — D649 Anemia, unspecified: Secondary | ICD-10-CM | POA: Diagnosis not present

## 2023-05-25 DIAGNOSIS — R195 Other fecal abnormalities: Secondary | ICD-10-CM | POA: Diagnosis not present

## 2023-05-25 DIAGNOSIS — I82C11 Acute embolism and thrombosis of right internal jugular vein: Secondary | ICD-10-CM | POA: Diagnosis not present

## 2023-05-25 LAB — HEPARIN LEVEL (UNFRACTIONATED)
Heparin Unfractionated: 0.24 [IU]/mL — ABNORMAL LOW (ref 0.30–0.70)
Heparin Unfractionated: 0.3 [IU]/mL (ref 0.30–0.70)

## 2023-05-25 LAB — BASIC METABOLIC PANEL
Anion gap: 7 (ref 5–15)
BUN: 11 mg/dL (ref 6–20)
CO2: 25 mmol/L (ref 22–32)
Calcium: 8.1 mg/dL — ABNORMAL LOW (ref 8.9–10.3)
Chloride: 102 mmol/L (ref 98–111)
Creatinine, Ser: 0.83 mg/dL (ref 0.61–1.24)
GFR, Estimated: >60 mL/min (ref >60)
Glucose, Bld: 109 mg/dL — ABNORMAL HIGH (ref 70–99)
Potassium: 4.1 mmol/L (ref 3.5–5.1)
Sodium: 134 mmol/L — ABNORMAL LOW (ref 135–145)

## 2023-05-25 LAB — CBC
HCT: 26.4 % — ABNORMAL LOW (ref 39.0–52.0)
Hemoglobin: 8.8 g/dL — ABNORMAL LOW (ref 13.0–17.0)
MCH: 29.6 pg (ref 26.0–34.0)
MCHC: 33.3 g/dL (ref 30.0–36.0)
MCV: 88.9 fL (ref 80.0–100.0)
Platelets: 92 10*3/uL — ABNORMAL LOW (ref 150–400)
RBC: 2.97 MIL/uL — ABNORMAL LOW (ref 4.22–5.81)
RDW: 13.4 % (ref 11.5–15.5)
WBC: 4.3 10*3/uL (ref 4.0–10.5)
nRBC: 0 % (ref 0.0–0.2)

## 2023-05-25 LAB — OCCULT BLOOD X 1 CARD TO LAB, STOOL: Fecal Occult Bld: POSITIVE — AB

## 2023-05-25 MED ORDER — PANTOPRAZOLE SODIUM 40 MG IV SOLR
40.0000 mg | Freq: Two times a day (BID) | INTRAVENOUS | Status: DC
  Administered 2023-05-25 – 2023-05-26 (×4): 40 mg via INTRAVENOUS
  Filled 2023-05-25 (×5): qty 10

## 2023-05-25 MED ORDER — SUCRALFATE 1 GM/10ML PO SUSP
1.0000 g | Freq: Three times a day (TID) | ORAL | Status: DC
  Administered 2023-05-25 – 2023-05-27 (×7): 1 g via ORAL
  Filled 2023-05-25 (×8): qty 10

## 2023-05-25 MED ORDER — MAGIC MOUTHWASH: 5.0000 mL | Freq: Three times a day (TID) | ORAL | Status: DC | PRN

## 2023-05-25 MED ORDER — HEPARIN BOLUS VIA INFUSION
2000.0000 [IU] | Freq: Once | INTRAVENOUS | Status: AC
  Administered 2023-05-25: 2000 [IU] via INTRAVENOUS
  Filled 2023-05-25: qty 2000

## 2023-05-25 MED ORDER — ALUM & MAG HYDROXIDE-SIMETH 200-200-20 MG/5ML PO SUSP: 30.0000 mL | Freq: Three times a day (TID) | ORAL | Status: DC | PRN

## 2023-05-25 MED ORDER — LIDOCAINE VISCOUS HCL 2 % MT SOLN
15.0000 mL | Freq: Three times a day (TID) | OROMUCOSAL | Status: DC | PRN
  Administered 2023-05-25 (×2): 15 mL via OROMUCOSAL
  Filled 2023-05-25: qty 15

## 2023-05-25 NOTE — Progress Notes (Signed)
 PHARMACY - ANTICOAGULATION CONSULT NOTE  Pharmacy Consult for Heparin Indication: DVT  No Known Allergies  Patient Measurements: Height: 5\' 4"  (162.6 cm) Weight: 63 kg (139 lb) IBW/kg (Calculated) : 59.2 HEPARIN DW (KG): 63   Vital Signs: Temp: 99.4 F (37.4 C) (02/24 1950) Temp Source: Oral (02/24 1950) BP: 133/77 (02/24 1950) Pulse Rate: 79 (02/24 1950)  Labs: Recent Labs    05/22/23 0418 05/22/23 0831 05/23/23 0537 05/24/23 0535 05/24/23 1434 05/24/23 2254  HGB 10.9*  --  9.9* 9.2*  --   --   HCT 31.6*  --  29.1* 27.2*  --   --   PLT 123*  --  97* 91*  --   --   HEPARINUNFRC >1.10*   < > 0.35 0.10* 0.22* 0.20*  CREATININE 1.13  --   --   --   --   --    < > = values in this interval not displayed.    Estimated Creatinine Clearance: 62.6 mL/min (by C-G formula based on SCr of 1.13 mg/dL).   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   Substance induced mood disorder (HCC) 11/05/2016    Assessment: 53 year old male with past medical history of throat cancer currently undergoing chemotherapy and radiation. Patient brought by EMS after a syncopal episode, Patient had bilateral carotid duplex u/s and showed acute occlusive thrombus throughout the right internal jugular vein. Not on oral anticoagulants prior to admission. Pharmacy asked to start heparin   HL 0.10> 0.22, subtherapeutic on 1100 units/hr Hgb 12.3> 10.9> 9.9> 9.2 , no overt noted. Notified MD, track and trend  2/25 AM update:  Heparin level sub-therapeutic   Goal of Therapy:  Heparin level 0.3-0.7 units/ml Monitor platelets by anticoagulation protocol: Yes   Plan:  Heparin bolus  2000 units Increase heparin infusion to 1350 units/hr Check heparin level in ~ 6-8 hours, and daily Continue to monitor CBC daily F/U transition to po DOAC  Abran Duke, PharmD, BCPS Clinical Pharmacist Phone: [phone]

## 2023-05-25 NOTE — Progress Notes (Signed)
 Nutrition Follow-up  DOCUMENTATION CODES:   Not applicable  INTERVENTION:   Recommend continue continuous TF via G-tube: Osmolite 1.5 at goal rate of 70 ml/h (1680 ml per day) Provides 2520 kcal, 105 gm protein, 1280 ml free water daily. Free water flushes 200 ml q4h for a total of 2480 ml free water daily (TF + flushes).   NUTRITION DIAGNOSIS:   Inadequate oral intake related to dysphagia, cancer and cancer related treatments as evidenced by NPO status.  Ongoing   GOAL:   Patient will meet greater than or equal to 90% of their needs  Met with TF  MONITOR:   TF tolerance  REASON FOR ASSESSMENT:   Consult Enteral/tube feeding initiation and management, Assessment of nutrition requirement/status  ASSESSMENT:   53 yo male admitted S/P syncopal episode. 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  Plans for d/c home today. Patient is tolerating continuous TF well. RD working remotely. Unable to reach patient by phone. Spoke with outpatient RD at the cancer center and patient has wanted to do continuous TF at home, but was unable to get a feeding pump. He was not tolerating bolus TF at home. Since he has been tolerating continuous feedings at goal rate in the hospital, recommend continue this at home.   Labs and medications reviewed.  Diet Order:   Diet Order             Diet NPO time specified Except for: Ice Chips, Sips with Meds  Diet effective midnight                   EDUCATION NEEDS:   Not appropriate for education at this time  Skin:  Skin Assessment: Reviewed RN Assessment  Last BM:  no BM documented  Height:   Ht Readings from Last 1 Encounters:  05/21/23 5\' 4"  (1.626 m)    Weight:   Wt Readings from Last 1 Encounters:  05/21/23 63 kg    Ideal Body Weight:  59.1 kg  BMI:  Body  mass index is 23.86 kg/m.  Estimated Nutritional Needs:   Kcal:  2200-2400  Protein:  105-125 gm  Fluid:  >/= 2.1 L   Gabriel Rainwater RD, LDN, CNSC Contact via secure chat. If unavailable, use group chat "RD Inpatient."

## 2023-05-25 NOTE — Progress Notes (Signed)
 PHARMACY - ANTICOAGULATION CONSULT NOTE  Pharmacy Consult for Heparin Indication: DVT  No Known Allergies  Patient Measurements: Height: 5\' 4"  (162.6 cm) Weight: 63 kg (139 lb) IBW/kg (Calculated) : 59.2 HEPARIN DW (KG): 63   Vital Signs: Temp: 98.9 F (37.2 C) (02/25 1918) Temp Source: Oral (02/25 1918) BP: 151/84 (02/25 1918) Pulse Rate: 71 (02/25 1918)  Labs: Recent Labs    05/23/23 0537 05/24/23 0535 05/24/23 1434 05/24/23 2254 05/25/23 0433 05/25/23 1153 05/25/23 2014  HGB 9.9* 9.2*  --   --  8.8*  --   --   HCT 29.1* 27.2*  --   --  26.4*  --   --   PLT 97* 91*  --   --  92*  --   --   HEPARINUNFRC 0.35 0.10*   < > 0.20*  --  0.24* 0.30  CREATININE  --   --   --   --  0.83  --   --    < > = values in this interval not displayed.    Estimated Creatinine Clearance: 85.2 mL/min (by C-G formula based on SCr of 0.83 mg/dL).   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   Substance induced mood disorder (HCC) 11/05/2016   Assessment: 53 year old male with past medical history of throat cancer currently undergoing chemotherapy and radiation. Patient brought by EMS after a syncopal episode, Patient had bilateral carotid duplex u/s and showed acute occlusive thrombus throughout the right internal jugular vein. Not on oral anticoagulants prior to admission. Pharmacy asked to start heparin   HL 0.30 - therapeutic x1 on 1450 units/hour Hgb 12.3> 10.9> 9.9> 9.2>8.8 , stool occult positive  Goal of Therapy:  Heparin level 0.3-0.7 units/ml Monitor platelets by anticoagulation protocol: Yes   Plan:  Continue heparin infusion at 1450 units/hr Check  confirmatory heparin level in 6 hours Continue to monitor CBC daily F/U transition to po DOAC  Thank you for involving pharmacy in this patient's care.   Rockwell Alexandria, PharmD Clinical Pharmacist 05/25/2023 9:31 PM

## 2023-05-25 NOTE — Progress Notes (Signed)
 Mobility Specialist Progress Note:    05/25/23 1410  Mobility  Activity Ambulated with assistance in hallway  Level of Assistance Modified independent, requires aide device or extra time  Assistive Device None  Distance Ambulated (ft) 200 ft  Range of Motion/Exercises Active;All extremities  Activity Response Tolerated well  Mobility Referral Yes  Mobility visit 1 Mobility  Mobility Specialist Start Time (ACUTE ONLY) 1410  Mobility Specialist Stop Time (ACUTE ONLY) 1425  Mobility Specialist Time Calculation (min) (ACUTE ONLY) 15 min   Pt received in bed, wife in room. Agreeable to mobility, ModI to stand and ambulate with no AD. Tolerated well, Left pt sitting EOB. All needs met.  Immanuel Fedak Mobility Specialist Please contact via Special educational needs teacher or  Rehab office at [phone]

## 2023-05-25 NOTE — Progress Notes (Signed)
 PROGRESS NOTE  [name], is a 53 y.o. male, DOB - [birth year], BJY:[id number]  Admit date - 05/21/2023   Admitting Physician [name], DO  Outpatient Primary MD for the patient is [name], MD  LOS - 4  Chief Complaint  Patient presents with   Loss of Consciousness      Brief Narrative:   53 y.o. 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     -Assessment and Plan: 1)Acute on Chronic Symptomatic Anemia--- Hgb 12.5 on 05/21/23 , Hgb is down to 8.8 chronic anemia at baseline due to underlying malignancy and chemoradiation treatments --- now with worsening anemia due to Acute Blood loss while on IV heparin for acute right IJ DVT.  -Heme +ve Stool with drop in Hgb while on iv Heparin ---  pt has Rt  IJ Vein DVT in the setting of underlying malignancy and Rt Sided Porth-A-cath---so he needs lifelong full anticoagulation------anticipate that he will Need EGD prior to switching from iv Heparin to Eliquis  -GI consult requested--patient has a PEG tube -IV PPI for now  2)Acute DVT----Carotid artery Dopplers with incidental finding of--Incidental note is made of acute occlusive thrombus throughout the right internal jugular vein----in the setting of malignancy and right-sided Port-A-Cath in situ -No hemodynamically significant carotid artery stenosis -- c/n  IV heparin, hold off on transition to DOAC for now due to Hgb dropping---see #1 above -Echo as noted as in  #4 below  3)Sepsis secondary to CAP with persistent fevers febrile illness---- -COVID, flu and RSV negative --CTA chest without PE, emphysema noted, bronchitis and possible pneumonia --Lactic acid 3.7 repeat 2.3--- received IV fluids  -UA not suggestive of UTI --WBC 3.2 >> 12.1 >>9.9>>6.2 >.4.3 -c/n Rocephin/azithromycin pending further culture data -Blood cultures and urine cx NGTD -No further fevers -No hypoxia--- post ambulation O2 sats on room air is 97%  4)Syncope-no significant arrhythmia on telemetry monitored unit,  -Ruled out already for ACS by cardiac enzymes and EKG - echocardiogram without significant aortic stenosis or other outflow obstruction, and  EF is 60 to 65%, No segmental/Regional wall motion abnormalities.,  No diastolic dysfunction, mild LVH normal) -Carotid artery Dopplers with incidental finding of--Incidental note is made of acute occlusive thrombus throughout the right internal jugular vein. -No hemodynamically significant carotid artery stenosis BP stable A.m. cortisol was 4.7--- ACTH stimulation test with appropriate response (Cortisol- 16.4 >>21.5 >>22.3) --Patient presented with syncope and hypotension--- BP has stabilized   5)Hypokalemia--- resolved with replacement - magnesium WNL,   6)Squamous cell Carcinoma of the Supraglottics (stage III (cT3, cN1, cM0)) in January 2025  --started radiation on 04/14/2023, chemotherapy with cisplatin on 04/19/2023,  He gets radiation Monday through Friday and takes chemo on Mondays. Last seen by primary oncologist--Dr. [name] on 05/14/23 -Discussed with the primary oncologist via secure chat -Discussed with radiation oncologist Dr. Basilio Cairo--- plans for possible radiation on Friday, 05/28/2023   7)Dysphagia/Odynophagia--- due to supraglottic squamous cell carcinoma and subsequent radiotherapy--- --dietitian consult for adjustment of PEG tube feeding appreciated -Patient has had poor tolerance  to bolus feeding  dietitian recommends continues to fluid with Osmolite 1.5, with plans to transition to bolus feeding later if patient tolerates continuous feeding at goal rate --Tolerating Tube feeding well -@ Goal  8)Acute on chronic anemia---Hgb 8.8 from a baseline usually between 11 and 12 -Hemoccult positive --Currently on IV heparin as above #1 -Please see plan for GI eval as above #1 hold off on transition to DOAC for now due to Hgb dropping    Status is: Inpatient   Disposition: The patient is from: Home              Anticipated d/c is to: Home              Anticipated d/c date is: 2 days              Patient currently is not medically stable to d/c. Barriers: Not Clinically Stable-   Code Status :  -  Code Status: Full Code   Family Communication:     (patient is alert, awake and coherent) Discussed with wife   DVT Prophylaxis  :   - SCDs /Iv Heparin  SCDs Start: 05/21/23 0822 Place TED hose Start: 05/21/23 0822   Lab Results  Component Value Date   PLT 92 (L) 05/25/2023   Inpatient Medications  Scheduled Meds:  Chlorhexidine Gluconate Cloth  6 each Topical Daily   famotidine  40 mg Per Tube Daily   FLUoxetine  20 mg Per Tube Daily   free water  200 mL Per Tube Q4H   guaiFENesin-dextromethorphan  15 mL Per Tube Q6H WA   metoprolol tartrate  12.5 mg Oral BID   pantoprazole (PROTONIX) IV  40 mg Intravenous Q12H   QUEtiapine  50 mg Per Tube QHS   sodium chloride flush  10-40 mL Intracatheter Q12H   sodium chloride flush  3 mL Intravenous Q12H   sodium chloride flush  3 mL Intravenous Q12H   Continuous Infusions:  azithromycin 500 mg (05/24/23 1302)   cefTRIAXone (ROCEPHIN)  IV 2 g (05/24/23 1207)   feeding supplement (OSMOLITE 1.5 CAL) 1,000 mL (05/22/23 1757)   heparin 1,350 Units/hr (05/25/23 0639)   PRN Meds:.acetaminophen **OR** acetaminophen, bisacodyl, HYDROmorphone (DILAUDID) injection, hydrOXYzine, magic mouthwash, ondansetron **OR** ondansetron (ZOFRAN) IV, oxyCODONE,  polyethylene glycol, sodium chloride flush, sodium chloride flush   Anti-infectives (From admission, onward)    Start     Dose/Rate Route Frequency Ordered Stop   05/22/23 1215  cefTRIAXone (ROCEPHIN) 2 g in sodium chloride 0.9 % 100 mL IVPB        2 g 200 mL/hr over 30 Minutes Intravenous Every 24 hours 05/22/23 1115     05/22/23 1215  azithromycin (ZITHROMAX) 500 mg in sodium chloride 0.9 % 250 mL IVPB        500 mg 250 mL/hr over 60 Minutes Intravenous Every 24 hours 05/22/23 1115         Subjective: [name] today has no emesis,  No chest pain,    --No further fevers -Dark stools--- Hemoccult positive =-Denies significant dyspnea or dizziness -Eating ice chips and clear liquid sips from time to time  Objective: Vitals:   05/24/23 0323 05/24/23 1327 05/24/23 1950 05/25/23 0410  BP: 105/70 100/63 133/77 112/79  Pulse: 79 77 79 71  Resp: 18 19 18 19   Temp: 98.8 F (37.1 C)  99.4 F (37.4 C) 99.2 F (37.3 C)  TempSrc: Oral  Oral Oral  SpO2: 96% 99% 97% 97%  Weight:  Height:       Filed Weights   05/21/23 0102 05/21/23 1648  Weight: 68 kg 63 kg    Physical Exam Gen:- Awake Alert, chronically ill-appearing, no acute distress  HEENT:- [place].AT, No sclera icterus Neck-Supple Neck,No JVD, radiation injury to skin of neck area Lungs-   fair air movement bilaterally, no wheezing, no rhonchi CV- S1, S2 normal, RRR, right-sided Port-A-Cath in situ--does not appear infected Abd-  +ve B.Sounds, Abd Soft, No tenderness, PEG tube site is clean dry and intact Extremity/Skin:- No  edema, pedal pulses present  Psych-affect is appropriate, oriented x3 Neuro-generalized weakness, no new focal deficits, no tremors  Data Reviewed: I have personally reviewed following labs and imaging studies  CBC: Recent Labs  Lab 05/21/23 0148 05/22/23 0418 05/23/23 0537 05/24/23 0535 05/25/23 0433  WBC 3.2* 12.1* 9.9 6.2 4.3  NEUTROABS 2.0  --   --   --   --   HGB 12.3* 10.9* 9.9*  9.2* 8.8*  HCT 36.9* 31.6* 29.1* 27.2* 26.4*  MCV 89.6 88.3 88.4 88.3 88.9  PLT 169 123* 97* 91* 92*   Basic Metabolic Panel: Recent Labs  Lab 05/21/23 0148 05/21/23 0824 05/22/23 0418 05/25/23 0433  NA 137  --  135 134*  K 2.9*  --  3.7 4.1  CL 101  --  107 102  CO2 22  --  22 25  GLUCOSE 104*  --  108* 109*  BUN 13  --  15 11  CREATININE 1.14  --  1.13 0.83  CALCIUM 8.9  --  8.1* 8.1*  MG  --  2.2  --   --    GFR: Estimated Creatinine Clearance: 85.2 mL/min (by C-G formula based on SCr of 0.83 mg/dL). Liver Function Tests: Recent Labs  Lab 05/21/23 0148 05/22/23 0418  AST 14* 14*  ALT 14 13  ALKPHOS 44 41  BILITOT 0.6 0.3  PROT 7.2 6.0*  ALBUMIN 3.4* 2.8*   Recent Results (from the past 240 hours)  Urine Culture     Status: None   Collection Time: 05/21/23  1:37 AM   Specimen: Urine, Clean Catch  Result Value Ref Range Status   Specimen Description   Final    URINE, CLEAN CATCH Performed at [facility], [address], [place], [place] [address]    Special Requests   Final    Normal Performed at [facility], [address] [place], [place] [address]    Culture   Final    NO GROWTH Performed at [facility], [address], [place], [place] [address]    Report Status 05/22/2023 FINAL  Final  Resp panel by RT-PCR (RSV, Flu A&B, Covid) Anterior Nasal Swab     Status: None   Collection Time: 05/21/23  1:48 AM   Specimen: Anterior Nasal Swab  Result Value Ref Range Status   SARS Coronavirus 2 by RT PCR NEGATIVE NEGATIVE Final    Comment: (NOTE) SARS-CoV-2 target nucleic acids are NOT DETECTED.  The SARS-CoV-2 RNA is generally detectable in upper respiratory specimens during the acute phase of infection. The lowest concentration of SARS-CoV-2 viral copies this assay can detect is 138 copies/mL. A negative result does not preclude SARS-Cov-2 infection and should not be used as the sole basis for treatment or other patient management decisions. A  negative result may occur with  improper specimen collection/handling, submission of specimen other than nasopharyngeal swab, presence of viral mutation(s) within the areas targeted by this assay, and inadequate  number of viral copies(<138 copies/mL). A negative result must be combined with clinical observations, patient history, and epidemiological information. The expected result is Negative.  Fact Sheet for Patients:  BloggerCourse.com  Fact Sheet for Healthcare Providers:  SeriousBroker.it  This test is no t yet approved or cleared by the [place] FDA and  has been authorized for detection and/or diagnosis of SARS-CoV-2 by FDA under an Emergency Use Authorization (EUA). This EUA will remain  in effect (meaning this test can be used) for the duration of the COVID-19 declaration under Section 564(b)(1) of the Act, 21 U.S.C.section 360bbb-3(b)(1), unless the authorization is terminated  or revoked sooner.       Influenza A by PCR NEGATIVE NEGATIVE Final   Influenza B by PCR NEGATIVE NEGATIVE Final    Comment: (NOTE) The Xpert Xpress SARS-CoV-2/FLU/RSV plus assay is intended as an aid in the diagnosis of influenza from Nasopharyngeal swab specimens and should not be used as a sole basis for treatment. Nasal washings and aspirates are unacceptable for Xpert Xpress SARS-CoV-2/FLU/RSV testing.  Fact Sheet for Patients: BloggerCourse.com  Fact Sheet for Healthcare Providers: SeriousBroker.it  This test is not yet approved or cleared by the [place] FDA and has been authorized for detection and/or diagnosis of SARS-CoV-2 by FDA under an Emergency Use Authorization (EUA). This EUA will remain in effect (meaning this test can be used) for the duration of the COVID-19 declaration under Section 564(b)(1) of the Act, 21 U.S.C. section 360bbb-3(b)(1), unless the authorization  is terminated or revoked.     Resp Syncytial Virus by PCR NEGATIVE NEGATIVE Final    Comment: (NOTE) Fact Sheet for Patients: BloggerCourse.com  Fact Sheet for Healthcare Providers: SeriousBroker.it  This test is not yet approved or cleared by the [place] FDA and has been authorized for detection and/or diagnosis of SARS-CoV-2 by FDA under an Emergency Use Authorization (EUA). This EUA will remain in effect (meaning this test can be used) for the duration of the COVID-19 declaration under Section 564(b)(1) of the Act, 21 U.S.C. section 360bbb-3(b)(1), unless the authorization is terminated or revoked.  Performed at [facility] [address] [place], [place] [address]   Culture, blood (routine x 2)     Status: None (Preliminary result)   Collection Time: 05/21/23  2:07 AM   Specimen: BLOOD  Result Value Ref Range Status   Specimen Description BLOOD PORTA CATH  Final   Special Requests   Final    BOTTLES DRAWN AEROBIC AND ANAEROBIC Blood Culture adequate volume   Culture   Final    NO GROWTH 4 DAYS Performed at [facility], [address] [place], [place] [address]    Report Status PENDING  Incomplete  Culture, blood (routine x 2)     Status: None (Preliminary result)   Collection Time: 05/21/23  2:07 AM   Specimen: BLOOD  Result Value Ref Range Status   Specimen Description BLOOD LEFT ANTECUBITAL  Final   Special Requests   Final    BOTTLES DRAWN AEROBIC AND ANAEROBIC Blood Culture adequate volume   Culture   Final    NO GROWTH 4 DAYS Performed at [facility], [address] [place], [place] [address]    Report Status PENDING  Incomplete    Radiology Studies: No results found.  Scheduled Meds:  Chlorhexidine Gluconate Cloth  6 each Topical Daily   famotidine  40 mg Per Tube Daily   FLUoxetine  20 mg Per Tube Daily   free water  200  mL Per Tube Q4H   guaiFENesin-dextromethorphan  15 mL Per Tube Q6H WA    metoprolol tartrate  12.5 mg Oral BID   pantoprazole (PROTONIX) IV  40 mg Intravenous Q12H   QUEtiapine  50 mg Per Tube QHS   sodium chloride flush  10-40 mL Intracatheter Q12H   sodium chloride flush  3 mL Intravenous Q12H   sodium chloride flush  3 mL Intravenous Q12H   Continuous Infusions:  azithromycin 500 mg (05/24/23 1302)   cefTRIAXone (ROCEPHIN)  IV 2 g (05/24/23 1207)   feeding supplement (OSMOLITE 1.5 CAL) 1,000 mL (05/22/23 1757)   heparin 1,350 Units/hr (05/25/23 0639)    LOS: 4 days   Shon Hale M.D on 05/25/2023 at 10:25 AM  Go to [email or web address] - for contact info  [facility] - Office  [phone]  If 7PM-7AM, please contact night-coverage [email or web address] 05/25/2023, 10:25 AM

## 2023-05-25 NOTE — TOC Transition Note (Addendum)
 Transition of Care [facility]) - Discharge Note   Patient Details  Name: [name] MRN: [id number] Date of Birth: 06-Dec-1970  [facility] CM/SW Contact:  Karn Cassis, LCSW Phone Number: 05/25/2023, 9:01 AM   Clinical Narrative: Pt d/c today. Jennifer with [facility] notified. HHRN order in.     Update: D/C canceled. Victorino Dike updated.    Final next level of care: Home w Home Health Services Barriers to Discharge: Barriers Resolved   Patient Goals and CMS Choice Patient states their goals for this hospitalization and ongoing recovery are:: to go home CMS Medicare.gov Compare Post Acute Care list provided to:: Patient Choice offered to / list presented to : Patient      Discharge Placement                       Discharge Plan and Services Additional resources added to the After Visit Summary for       Post Acute Care Choice: Resumption of Svcs/PTA Provider                    HH Arranged: RN [facility] Agency: [facility] Date [facility] Agency Contacted: 05/23/23 Time HH Agency Contacted: [phone] Representative spoke with at [facility] Agency: [place]  Social Drivers of Health (SDOH) Interventions SDOH Screenings   Food Insecurity: No Food Insecurity (05/21/2023)  Housing: Low Risk  (05/21/2023)  Transportation Needs: No Transportation Needs (05/21/2023)  Utilities: Not At Risk (05/21/2023)  Alcohol Screen: Medium Risk (06/23/2017)  Depression (PHQ2-9): High Risk (03/29/2023)  Tobacco Use: Medium Risk (05/21/2023)     Readmission Risk Interventions    05/22/2023    4:02 PM 05/07/2023   11:49 AM  Readmission Risk Prevention Plan  Transportation Screening Complete Complete  PCP or Specialist Appt within 3-5 Days Complete Complete  HRI or Home Care Consult Complete Complete  Social Work Consult for Recovery Care Planning/Counseling Complete Complete  Palliative Care Screening Complete Not Applicable  Medication Review Oceanographer) Complete  Complete

## 2023-05-25 NOTE — Plan of Care (Signed)
   Problem: Education: Goal: Knowledge of General Education information will improve Description Including pain rating scale, medication(s)/side effects and non-pharmacologic comfort measures Outcome: Progressing

## 2023-05-25 NOTE — Consult Note (Signed)
 @LOGO @   Referring Provider: [facility] hospitalist Primary Care Physician:  [name], MD Primary Gastroenterologist:  Dr. Darrick Penna previously. Will have Dr. Marletta Lor as primary GI moving forward.   Date of Admission: 05/21/2023 Date of Consultation: 05/25/2023  Reason for Consultation: Heme positive stool, declining hemoglobin  HPI:  [name] is a 53 y.o. 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   Patient was started on antibiotics for pneumonia, IV heparin for acute DVT.  Noted hemoglobin has been declining since admission.  Hemoglobin was 12.5 on 2/21, down to 8.8 today with dark, heme positive stool. HE was started on IV PPI BID and GI consulted for further evaluation.   Notably, patient's Hgb previously within normal limits until 2//25 when hemoglobin started fluctuating in the 11-12 range.    Today:  Reports having something like a stomach bug when he came in. Had acute onset diarrhea with dark/black stool, but none since. Symptoms resolved within 24 hours. Bms are back to normal.  He never had bright red blood per rectum.  No history of overt GI bleeding prior to admission.  Denies abdominal pain, history of GERD, nausea, vomiting. Had been struggling with dysphagia/odynophagia for several months. States doctors kept treating like it was a sinus infection until he went to ENT in December and was  diagnosed with cancer.  Continues to struggle with dysphagia/odynophagia.  He does take some things by mouth such as soup/broth.  States nothing tastes good since starting chemo.  He was more recently started on Pepcid due to chemo/radiation. No pepto or iron.   Reports about 15 pound weight loss.   NSAIDs: Used to take Aleve. Stopped Aleve in October or November. Had been taking them for months, every day due to his throat. Switched to tylenol after diagnosis of cancer.  Tobacco: Stopped after diagnosis of Cancer. .Used to smoke 1/2 pack a day.  ETOH: Beer occasionally prior to diagnosis of cancer.   Last EGD 01/05/2017: Normal esophagus.  Normocytic anemia most likely due to moderate gastritis and mild duodenitis.  With chronic gastritis, negative for H. pylori, peptic duodenitis.  Last colonoscopy 01/05/2017: Mild diverticulosis in the transverse, ascending colon, cecum, redundant left colon, external and internal hemorrhoids.  Recommended screening in 10 years.   Regarding respiratory status: He denies SOB, Cough, or CP.    Past Medical History:  Diagnosis Date   Anxiety    Bipolar 1 disorder (HCC)    EtOH dependence (HCC)    QUIT   GERD without esophagitis    Hepatitis C carrier (HCC)    Hypercholesteremia    Hypertension    laryngeal ca 02/2023   Polysubstance dependence (HCC)    COCAINE- quit   Stroke [facility])    has some ST memory deficits from stroke.   Substance induced mood disorder (HCC) 11/05/2016    Past Surgical History:  Procedure Laterality Date   ANEURYSM COILING     AGE 89 x2   APPENDECTOMY  BIOPSY  01/05/2017   Procedure: BIOPSY;  Surgeon: West Bali, MD;  Location: [facility];  Service: Endoscopy;;  Duodenal and Gastric   COLONOSCOPY WITH PROPOFOL N/A 01/05/2017   Procedure: COLONOSCOPY WITH PROPOFOL;  Surgeon: West Bali, MD;  Location: [facility];  Service: Endoscopy;  Laterality: N/A;  10:00am   DIRECT LARYNGOSCOPY Left 03/16/2023    Procedure: Direct laryngoscopy with biopsy of supraglottic mass;  Surgeon: Newman Pies, MD;  Location: [facility];  Service: ENT;  Laterality: Left;   ESOPHAGOGASTRODUODENOSCOPY (EGD) WITH PROPOFOL N/A 01/05/2017   Procedure: ESOPHAGOGASTRODUODENOSCOPY (EGD) WITH PROPOFOL;  Surgeon: West Bali, MD;  Location: [facility];  Service: Endoscopy;  Laterality: N/A;   IR GASTROSTOMY TUBE MOD SED  04/28/2023   IR IMAGING GUIDED PORT INSERTION  04/28/2023   IR PATIENT EVAL TECH 0-60 MINS  05/03/2023   LAPAROSCOPIC APPENDECTOMY     MASS EXCISION Right 01/13/2017   Procedure: EXCISION LIPOMA RIGHT ARM;  Surgeon: Lucretia Roers, MD;  Location: [facility];  Service: General;  Laterality: Right;    Prior to Admission medications   Medication Sig Start Date End Date Taking? Authorizing Provider  acetaminophen (TYLENOL) 500 MG tablet Take 1,000 mg by mouth every 6 (six) hours as needed for mild pain (pain score 1-3) or moderate pain (pain score 4-6).   Yes [provider]  amlodipine-olmesartan (AZOR) 10-20 MG tablet TAKE 1 TABLET BY MOUTH DAILY 05/10/23  Yes [name], MD  dexamethasone (DECADRON) 4 MG tablet Take 2 tablets (8 mg) by mouth daily x 3 days starting the day after cisplatin chemotherapy. Take with food. 04/14/23  Yes Pasam, Avinash, MD  famotidine (PEPCID) 40 MG tablet Take 1 tablet (40 mg total) by mouth daily. 02/04/23  Yes [name], MD  FLUoxetine (PROZAC) 20 MG capsule Take 1 capsule (20 mg total) by mouth daily. 03/29/23  Yes [name], MD  hydrOXYzine (ATARAX) 25 MG tablet Take 1 tablet (25 mg total) by mouth 3 (three) times daily as needed for anxiety. Patient taking differently: Take 25 mg by mouth at bedtime. 02/04/23  Yes [name], MD  lidocaine (XYLOCAINE) 2 % solution Patient: Mix 1part 2% viscous lidocaine, 1part H20. Swallow 10mL of diluted mixture, before meals and at bedtime, up to QID 03/26/23  Yes Lonie Peak, MD   lidocaine-prilocaine (EMLA) cream Apply to affected area once 04/14/23  Yes Pasam, Avinash, MD  metoprolol succinate (TOPROL-XL) 25 MG 24 hr tablet TAKE 1 TABLET(25 MG) BY MOUTH DAILY 05/10/23  Yes [name], MD  Nutritional Supplements (FEEDING SUPPLEMENT, OSMOLITE 1.5 CAL,) LIQD Place 60 mL/hr into feeding tube continuous. 7 Cartons of Osmolite 1.5 per day + an additional free water from flushes For long-term Patient taking differently: Place 60 mL/hr into feeding tube continuous. 4 Cartons of Osmolite 1.5 per day + an additional free water from flushes For long-term 05/07/23  Yes Tobey Grim, MD  ondansetron (ZOFRAN) 8 MG tablet Take 1 tablet (8 mg total) by mouth every 8 (eight) hours as needed for nausea or vomiting. Start on the third day after cisplatin. 04/14/23  Yes Pasam, Avinash, MD  prochlorperazine (COMPAZINE) 10 MG tablet TAKE 1 TABLET(10 MG) BY MOUTH EVERY 6 HOURS AS NEEDED FOR NAUSEA OR VOMITING 04/29/23  Yes Pasam, Avinash, MD  QUEtiapine (SEROQUEL) 50 MG tablet Take one pill nightly for 1 week. Then increase to 2 pills nightly for 1 week. Then  increase to 3 pills nightly for one week. Then increase to 4 pills nightly for one week. Patient taking differently: Take 50 mg by mouth at bedtime. Take one pill nightly for 1 week. Then increase to 2 pills nightly for 1 week. Then increase to 3 pills nightly for one week. Then increase to 4 pills nightly for one week. 04/05/23  Yes Elsie Lincoln, MD  nicotine (NICODERM CQ - DOSED IN MG/24 HOURS) 21 mg/24hr patch Place 1 patch (21 mg total) onto the skin daily. Apply 21 mg patch daily x 6 wk, then 14mg  patch daily x 2 wk, then 7 mg patch daily x 2 wk Patient not taking: Reported on 05/21/2023 03/26/23   Lonie Peak, MD  Protein (FEEDING SUPPLEMENT, PROSOURCE TF20,) liquid Place 60 mLs into feeding tube daily. Patient not taking: Reported on 05/21/2023 05/08/23   Tobey Grim, MD  QUEtiapine (SEROQUEL) 200 MG tablet  Take 1 tablet (200 mg total) by mouth at bedtime. Patient not taking: Reported on 05/21/2023 05/02/23   Elsie Lincoln, MD  scopolamine (TRANSDERM-SCOP) 1 MG/3DAYS Place 1 patch (1.5 mg total) onto the skin every 3 (three) days. Patient not taking: Reported on 05/21/2023 05/03/23   Lonie Peak, MD    Current Facility-Administered Medications  Medication Dose Route Frequency Provider Last Rate Last Admin   acetaminophen (TYLENOL) tablet 650 mg  650 mg Per Tube Q6H PRN Shon Hale, MD   650 mg at 05/22/23 1750   Or   acetaminophen (TYLENOL) suppository 650 mg  650 mg Rectal Q6H PRN Emokpae, Courage, MD       alum & mag hydroxide-simeth (MAALOX/MYLANTA) 200-200-20 MG/5ML suspension 30 mL  30 mL Oral TID PRN Emokpae, Courage, MD       And   lidocaine (XYLOCAINE) 2 % viscous mouth solution 15 mL  15 mL Mouth/Throat TID PRN Mariea Clonts, Courage, MD       azithromycin (ZITHROMAX) 500 mg in sodium chloride 0.9 % 250 mL IVPB  500 mg Intravenous Q24H Emokpae, Courage, MD 250 mL/hr at 05/24/23 1302 500 mg at 05/24/23 1302   bisacodyl (DULCOLAX) suppository 10 mg  10 mg Rectal Daily PRN Shon Hale, MD       cefTRIAXone (ROCEPHIN) 2 g in sodium chloride 0.9 % 100 mL IVPB  2 g Intravenous Q24H Emokpae, Courage, MD 200 mL/hr at 05/24/23 1207 2 g at 05/24/23 1207   Chlorhexidine Gluconate Cloth 2 % PADS 6 each  6 each Topical Daily Shon Hale, MD   6 each at 05/25/23 0942   famotidine (PEPCID) tablet 40 mg  40 mg Per Tube Daily Emokpae, Courage, MD   40 mg at 05/25/23 0801   feeding supplement (OSMOLITE 1.5 CAL) liquid 1,000 mL  1,000 mL Per Tube Continuous Emokpae, Courage, MD 70 mL/hr at 05/22/23 1757 1,000 mL at 05/22/23 1757   FLUoxetine (PROZAC) capsule 20 mg  20 mg Per Tube Daily Emokpae, Courage, MD   20 mg at 05/25/23 0801   free water 200 mL  200 mL Per Tube Q4H Emokpae, Courage, MD   200 mL at 05/25/23 0808   guaiFENesin-dextromethorphan (ROBITUSSIN DM) 100-10 MG/5ML syrup 15 mL  15 mL  Per Tube Q6H WA Emokpae, Courage, MD   15 mL at 05/25/23 0800   heparin ADULT infusion 100 units/mL (25000 units/251mL)  1,350 Units/hr Intravenous Continuous Stevphen Rochester, RPH 13.5 mL/hr at 05/25/23 0639 1,350 Units/hr at 05/25/23 0639   HYDROmorphone (DILAUDID) injection 0.5 mg  0.5 mg Intravenous  Q3H PRN [name], DO   0.5 mg at 05/25/23 0801   hydrOXYzine (ATARAX) tablet 25 mg  25 mg Per Tube TID PRN Shon Hale, MD   25 mg at 05/24/23 2035   metoprolol tartrate (LOPRESSOR) tablet 12.5 mg  12.5 mg Oral BID Emokpae, Courage, MD   12.5 mg at 05/25/23 0801   ondansetron (ZOFRAN) tablet 4 mg  4 mg Per Tube Q6H PRN Shon Hale, MD       Or   ondansetron (ZOFRAN) injection 4 mg  4 mg Intravenous Q6H PRN Emokpae, Courage, MD       oxyCODONE (Oxy IR/ROXICODONE) immediate release tablet 5 mg  5 mg Oral Q4H PRN Mariea Clonts, Courage, MD   5 mg at 05/25/23 0441   pantoprazole (PROTONIX) injection 40 mg  40 mg Intravenous Q12H Emokpae, Courage, MD       polyethylene glycol (MIRALAX / GLYCOLAX) packet 17 g  17 g Per Tube Daily PRN Emokpae, Courage, MD       QUEtiapine (SEROQUEL) tablet 50 mg  50 mg Per Tube QHS Emokpae, Courage, MD   50 mg at 05/24/23 2035   sodium chloride flush (NS) 0.9 % injection 10-40 mL  10-40 mL Intracatheter Q12H Emokpae, Courage, MD   10 mL at 05/25/23 1610   sodium chloride flush (NS) 0.9 % injection 10-40 mL  10-40 mL Intracatheter PRN Emokpae, Courage, MD       sodium chloride flush (NS) 0.9 % injection 3 mL  3 mL Intravenous Q12H Emokpae, Courage, MD   3 mL at 05/25/23 0809   sodium chloride flush (NS) 0.9 % injection 3 mL  3 mL Intravenous Q12H Emokpae, Courage, MD   3 mL at 05/25/23 9604   sodium chloride flush (NS) 0.9 % injection 3 mL  3 mL Intravenous PRN Shon Hale, MD        Allergies as of 05/21/2023   (No Known Allergies)    Family History  Problem Relation Age of Onset   Cancer Mother        Lung   Stomach cancer Mother    Cancer  Sister        Bone cancer   Bone cancer Sister 104   Cancer Brother        3 brothers with prostate cancer   Colon cancer Brother 23   Colon polyps Neg Hx     Social History   Socioeconomic History   Marital status: Married    Spouse name: Not on file   Number of children: Not on file   Years of education: Not on file   Highest education level: Not on file  Occupational History   Not on file  Tobacco Use   Smoking status: Former    Current packs/day: 0.25    Average packs/day: 0.3 packs/day for 25.0 years (6.3 ttl pk-yrs)    Types: Cigarettes   Smokeless tobacco: Never   Tobacco comments:    More than 2 packs/day as of 03/18/2023 but quit around the new year  Vaping Use   Vaping status: Every Day   Substances: Nicotine  Substance and Sexual Activity   Alcohol use: Yes    Alcohol/week: 1.0 standard drink of alcohol    Types: 1 Cans of beer per week    Comment: 1 beer at a time when consuming but unclear how frequent.   Drug use: Not Currently    Types: "Crack" cocaine, Cocaine, Marijuana    Comment: last positive in 2020.  Ssta used last 2 months ago.   Sexual activity: Yes    Birth control/protection: Condom  Other Topics Concern   Not on file  Social History Narrative   NOW ON SHORT TERM DISABILITY. 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   MARRIED-3 WITH WIFE AND 3 PREVIOUS.   Social Drivers of Corporate investment banker Strain: Not on file  Food Insecurity: No Food Insecurity (05/21/2023)   Hunger Vital Sign    Worried About Running Out of Food in the Last Year: Never true    Ran Out of Food in the Last Year: Never true  Transportation Needs: No Transportation Needs (05/21/2023)   PRAPARE - Administrator, Civil Service (Medical): No    Lack of Transportation (Non-Medical): No  Physical Activity: Not on file  Stress: Not on file  Social Connections: Not on file  Intimate Partner Violence: Not At Risk (05/21/2023)   Humiliation, Afraid,  Rape, and Kick questionnaire    Fear of Current or Ex-Partner: No    Emotionally Abused: No    Physically Abused: No    Sexually Abused: No    Review of Systems: Gen: Denies fever, chills, cold or flu like symptoms. CV: Denies chest pain, heart palpitations. Resp: Denies shortness of breath, cough.  GI: See HPI GU : Denies urinary burning, urinary frequency, urinary incontinence.  MS: Denies joint pain.  Derm: Denies rash. Psych: Denies depression, anxiety. Heme: See HPI  Physical Exam: Vital signs in last 24 hours: Temp:  [99.2 F (37.3 C)-99.4 F (37.4 C)] 99.2 F (37.3 C) (02/25 0410) Pulse Rate:  [71-79] 71 (02/25 0410) Resp:  [18-19] 19 (02/25 0410) BP: (100-133)/(63-79) 112/79 (02/25 0410) SpO2:  [97 %-99 %] 97 % (02/25 0410) Last BM Date : 05/22/23 General:   Alert,  Well-developed, well-nourished, pleasant and cooperative in NAD Head:  Normocephalic and atraumatic. Eyes:  Sclera clear, no icterus.   Conjunctiva pink. Ears:  Normal auditory acuity. Lungs:  Clear throughout to auscultation.   No wheezes, crackles, or rhonchi. No acute distress. Heart:  Regular rate and rhythm; no murmurs, clicks, rubs,  or gallops. Abdomen:  Soft, nontender and nondistended. No masses, hepatosplenomegaly or hernias noted. Normal bowel sounds, without guarding, and without rebound.  G tube in place.  Rectal:  Deferred  Msk:  Symmetrical without gross deformities. Normal posture. Extremities:  Without edema. Neurologic:  Alert and  oriented x4;  grossly normal neurologically. Skin:  Intact without significant lesions or rashes. Psych:  Normal mood and affect.  Intake/Output from previous day: No intake/output data recorded. Intake/Output this shift: No intake/output data recorded.  Lab Results: Recent Labs    05/23/23 0537 05/24/23 0535 05/25/23 0433  WBC 9.9 6.2 4.3  HGB 9.9* 9.2* 8.8*  HCT 29.1* 27.2* 26.4*  PLT 97* 91* 92*   BMET Recent Labs    05/25/23 0433  NA  134*  K 4.1  CL 102  CO2 25  GLUCOSE 109*  BUN 11  CREATININE 0.83  CALCIUM 8.1*     Impression: 53 y.o. 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 Patient was started on antibiotics for pneumonia, IV heparin for acute DVT. Noted hemoglobin has been declining since admission and GI now consulted due to anemia with heme positive stool.   Worsening anemia, heme positive stool, black stool:  Recent baseline hemoglobin since early February in the 11-12 range though previously within normal limits.  Since admission, hemoglobin has been declining.  Hemoglobin was 12.5 on 2/21, down to 8.8 today.  Stool heme positive today.  Patient reports 24 hours of diarrhea with black stool when he presented to the emergency room, but no persistent symptoms.  Denies ever having bright red blood per rectum.  Patient may have recently suffered upper GI bleed and may still be oozing from the upper GI tract or small bowel in the setting of heparin. In the setting of daily NSAIDs for the last several months, concern for PUD, gastritis, duodenitis, bleeding AVMs.  In the setting of radiation, concern for esophagitis, possible ulcerative esophagitis. Malignancy remains in the differential. Less likely, but unable to rule out right-sided colonic lesion as this can also present with melena.  Last EGD and colonoscopy on file from 2018. No history  of colon polyps. EGD with gastritis.   At this point, would consider EGD for further evaluation of recent black stool and declining hemoglobin. May need to consider colonoscopy if EGD unrevealing. However, consider malignancy involving the supraglottis, acute right IJ DVT, patient is at increased anesthesia risk.  Will need to review this case with anesthesia to see if we can proceed with procedures here at [facility].    Plan: Will need to review case with anesthesia to see if patient is candidate for endoscopic evaluation at [facility]. Recommend starting with EGD. May need to consider colonoscopy if EGD unrevealing.  IV PPI BID.  Carafate QID Continue to follow H/H and monitor for overt GI bleeding.  Transfuse for Hgb <7.    LOS: 4 days    05/25/2023, 10:55 AM   Ermalinda Memos, PA-C [facility]

## 2023-05-25 NOTE — Progress Notes (Signed)
 PHARMACY - ANTICOAGULATION CONSULT NOTE  Pharmacy Consult for Heparin Indication: DVT  No Known Allergies  Patient Measurements: Height: 5\' 4"  (162.6 cm) Weight: 63 kg (139 lb) IBW/kg (Calculated) : 59.2 HEPARIN DW (KG): 63   Vital Signs: Temp: 99.2 F (37.3 C) (02/25 0410) Temp Source: Oral (02/25 0410) BP: 112/79 (02/25 0410) Pulse Rate: 71 (02/25 0410)  Labs: Recent Labs    05/23/23 0537 05/24/23 0535 05/24/23 1434 05/24/23 2254 05/25/23 0433 05/25/23 1153  HGB 9.9* 9.2*  --   --  8.8*  --   HCT 29.1* 27.2*  --   --  26.4*  --   PLT 97* 91*  --   --  92*  --   HEPARINUNFRC 0.35 0.10* 0.22* 0.20*  --  0.24*  CREATININE  --   --   --   --  0.83  --     Estimated Creatinine Clearance: 85.2 mL/min (by C-G formula based on SCr of 0.83 mg/dL).   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   Substance induced mood disorder (HCC) 11/05/2016   Assessment: 53 year old male with past medical history of throat cancer currently undergoing chemotherapy and radiation. Patient brought by EMS after a syncopal episode, Patient had bilateral carotid duplex u/s and showed acute occlusive thrombus throughout the right internal jugular vein. Not on oral anticoagulants prior to admission. Pharmacy asked to start heparin   HL 0.24 subtherapeutic on 1350 units/hr Hgb 12.3> 10.9> 9.9> 9.2>8.8 , stool occult positive  Goal of Therapy:  Heparin level 0.3-0.7 units/ml Monitor platelets by anticoagulation protocol: Yes   Plan:  Increase heparin infusion to 1450 units/hr Check heparin level in ~ 6 hours and daily Continue to monitor CBC daily F/U transition to po  DOAC  Judeth Cornfield, PharmD Clinical Pharmacist 05/25/2023 1:17 PM

## 2023-05-26 ENCOUNTER — Ambulatory Visit: Payer: Medicare HMO

## 2023-05-26 DIAGNOSIS — Z8719 Personal history of other diseases of the digestive system: Secondary | ICD-10-CM

## 2023-05-26 DIAGNOSIS — Z95828 Presence of other vascular implants and grafts: Secondary | ICD-10-CM | POA: Diagnosis not present

## 2023-05-26 DIAGNOSIS — I82C11 Acute embolism and thrombosis of right internal jugular vein: Secondary | ICD-10-CM | POA: Diagnosis not present

## 2023-05-26 DIAGNOSIS — I959 Hypotension, unspecified: Secondary | ICD-10-CM

## 2023-05-26 DIAGNOSIS — I8290 Acute embolism and thrombosis of unspecified vein: Secondary | ICD-10-CM

## 2023-05-26 DIAGNOSIS — D649 Anemia, unspecified: Secondary | ICD-10-CM | POA: Diagnosis not present

## 2023-05-26 DIAGNOSIS — R195 Other fecal abnormalities: Secondary | ICD-10-CM | POA: Diagnosis not present

## 2023-05-26 DIAGNOSIS — I10 Essential (primary) hypertension: Secondary | ICD-10-CM

## 2023-05-26 DIAGNOSIS — R55 Syncope and collapse: Secondary | ICD-10-CM | POA: Diagnosis not present

## 2023-05-26 LAB — RENAL FUNCTION PANEL
Albumin: 2.5 g/dL — ABNORMAL LOW (ref 3.5–5.0)
Anion gap: 10 (ref 5–15)
BUN: 10 mg/dL (ref 6–20)
CO2: 26 mmol/L (ref 22–32)
Calcium: 8.2 mg/dL — ABNORMAL LOW (ref 8.9–10.3)
Chloride: 99 mmol/L (ref 98–111)
Creatinine, Ser: 0.91 mg/dL (ref 0.61–1.24)
GFR, Estimated: >60 mL/min (ref >60)
Glucose, Bld: 125 mg/dL — ABNORMAL HIGH (ref 70–99)
Phosphorus: 4.3 mg/dL (ref 2.5–4.6)
Potassium: 4.3 mmol/L (ref 3.5–5.1)
Sodium: 135 mmol/L (ref 135–145)

## 2023-05-26 LAB — CBC
HCT: 27 % — ABNORMAL LOW (ref 39.0–52.0)
Hemoglobin: 8.8 g/dL — ABNORMAL LOW (ref 13.0–17.0)
MCH: 29.3 pg (ref 26.0–34.0)
MCHC: 32.6 g/dL (ref 30.0–36.0)
MCV: 90 fL (ref 80.0–100.0)
Platelets: 98 10*3/uL — ABNORMAL LOW (ref 150–400)
RBC: 3 MIL/uL — ABNORMAL LOW (ref 4.22–5.81)
RDW: 13.3 % (ref 11.5–15.5)
WBC: 3.7 10*3/uL — ABNORMAL LOW (ref 4.0–10.5)
nRBC: 0 % (ref 0.0–0.2)

## 2023-05-26 LAB — IRON AND TIBC
Iron: 41 ug/dL — ABNORMAL LOW (ref 45–182)
Saturation Ratios: 25 % (ref 17.9–39.5)
TIBC: 162 ug/dL — ABNORMAL LOW (ref 250–450)
UIBC: 121 ug/dL

## 2023-05-26 LAB — CULTURE, BLOOD (ROUTINE X 2)
Culture: NO GROWTH
Culture: NO GROWTH
Special Requests: ADEQUATE
Special Requests: ADEQUATE

## 2023-05-26 LAB — FERRITIN: Ferritin: 529 ng/mL — ABNORMAL HIGH (ref 24–336)

## 2023-05-26 LAB — HEPARIN LEVEL (UNFRACTIONATED): Heparin Unfractionated: 0.39 [IU]/mL (ref 0.30–0.70)

## 2023-05-26 NOTE — Progress Notes (Signed)
 Subjective: Patient feeling okay today. Has had two BMs so far, denies diarrhea, no further melena and no rectal bleeding. Denies nausea or vomiting.   Objective: Vital signs in last 24 hours: Temp:  [98 F (36.7 C)-99.7 F (37.6 C)] 98 F (36.7 C) (02/26 1219) Pulse Rate:  [61-77] 73 (02/26 1219) Resp:  [16-20] 17 (02/26 1219) BP: (121-151)/(70-88) 124/82 (02/26 1219) SpO2:  [97 %-100 %] 97 % (02/26 1219) Last BM Date : 05/22/23 General:   Alert and oriented, pleasant Head:  Normocephalic and atraumatic. Eyes:  No icterus, sclera clear. Conjuctiva pink.  Mouth:  Without lesions, mucosa pink and moist.   Heart:  S1, S2 present, no murmurs noted.  Lungs: Clear to auscultation bilaterally, without wheezing, rales, or rhonchi.  Abdomen:  Bowel sounds present, soft, non-tender, non-distended. No HSM or hernias noted. No rebound or guarding. No masses appreciated  Psych:  Alert and cooperative. Normal mood and affect.  Lab Results: Recent Labs    05/24/23 0535 05/25/23 0433 05/26/23 0203  WBC 6.2 4.3 3.7*  HGB 9.2* 8.8* 8.8*  HCT 27.2* 26.4* 27.0*  PLT 91* 92* 98*   BMET Recent Labs    05/25/23 0433 05/26/23 0203  NA 134* 135  K 4.1 4.3  CL 102 99  CO2 25 26  GLUCOSE 109* 125*  BUN 11 10  CREATININE 0.83 0.91  CALCIUM 8.1* 8.2*   LFT Recent Labs    05/26/23 0203  ALBUMIN 2.5*     Assessment: [name] is a 53 year old male with medical history significant for bipolar 1 disorder/anxiety, history of polysubstance abuse including cocaine and alcohol currently in remission, ongoing tobacco abuse, hepatitis C status posttreatment with Harvoni in 2018/2019 achieving SVR, GERD, hypertension and hyperlipidemia, 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  Patient started on IV heparin for acute DVT, hemoglobin noted to be declining since admission and GI consulted for further evaluation of anemia and heme positive stool.  Worsening anemia/heme positive stool/black stool: Recent baseline hemoglobin since early February 11 to 12 range though previously within normal limits.  Hemoglobin declining since admission from 12.5 down to 8.8, he has remained within the 8-9 range over the past few days  Iron studies in yesterday with iron 41, TIBC 162, saturation 25, ferritin 529.   patient did report some diarrhea with black stool when he presented to the ED but no persistent symptoms.  No bright red blood per rectum  Patient did endorse daily NSAID use for the last several months and is in setting of recent heparin, cannot rule out upper GI bleed from PUD, gastritis, duodenitis, bleeding AVMs.  In setting of radiation, also cannot rule out esophagitis, ulcerative esophagitis.  Malignancy still remains in the differential.  Also unable to rule out right sided colonic lesion given melena for less likely.  Last EGD and colonoscopy on file from 2018, no history of colon polyps and EGD with gastritis.  Our team is consider EGD for further evaluation of recent black stools and declining hemoglobin and may need to consider colonoscopy if EGD is unrevealing, however case was discussed with anesthesia who felt that patient was too high risk to have endoscopic procedures here at [facility].  This decision  was also relayed to the patient and the hospitalist.  It was recommended the patient started on PPI twice daily, Carafate slurry 1 g every 6 hours with trending of hemoglobin daily.  If hemoglobin improves, possibly can transition to oral coag anticoagulation and have potential outpatient endoscopic procedures though if anemia worsens he would need to be transferred to a  larger facility such as [facility] or [place] for endoscopic evaluation more urgently.    Plan: -Continue PPI twice daily -Continue Carafate 1 g every 6 hours -Continue to trend hemoglobin and monitor for overt bleeding -Transfuse for hemoglobin less than 7 -Will need to consider transfer to higher level of care for endoscopic procedures if hemoglobin declines further   LOS: 5 days    05/26/2023   Montie Swiderski L. Jeanmarie Hubert, MSN, APRN, AGNP-C Adult-Gerontology Nurse Practitioner [facility] Gastroenterology at [facility]

## 2023-05-26 NOTE — Progress Notes (Signed)
 PROGRESS NOTE  [name], is a 53 y.o. male, DOB - 06-30-1970, ZOX:[id number]  Admit date - 05/21/2023   Admitting Physician [name], DO  Outpatient Primary MD for the patient is [name], MD  LOS - 5  Chief Complaint  Patient presents with   Loss of Consciousness      Brief Narrative:   53 y.o. 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    -Assessment and Plan: 1)Acute on Chronic Symptomatic Anemia--- Hgb 12.5 on 05/21/23 , Hgb is down to 8.8 chronic anemia at baseline due to underlying malignancy and chemoradiation treatments --- now with worsening anemia due to Acute Blood loss while on IV heparin for acute right IJ DVT.  -Heme +ve Stool with drop in Hgb while on iv Heparin ---  pt has Rt  IJ Vein DVT in the setting of underlying malignancy and Rt Sided Porth-A-cath---so he needs lifelong full anticoagulation------anticipate that he will Need EGD prior to switching from iv Heparin to Eliquis  -GI consult appreciated; continue PPI twice a day along with Carafate -Continue PEG tube. -If hemoglobin continues to drop patient will need to be transferred to higher level of care for endoscopic evaluation.  2)Acute DVT----Carotid artery Dopplers with incidental finding of--Incidental note is made of acute occlusive thrombus throughout the right internal jugular vein----in the setting of malignancy and right-sided Port-A-Cath in situ -No hemodynamically  significant carotid artery stenosis -- c/n  IV heparin, hold off on transition to DOAC for now due to Hgb dropping---see #1 above -Echo as noted as in #4 below  3)Sepsis secondary to CAP with persistent fevers febrile illness---- -COVID, flu and RSV negative --CTA chest without PE, emphysema noted, bronchitis and possible pneumonia --Lactic acid 3.7 repeat 2.3--- received IV fluids  -UA not suggestive of UTI --WBC 3.2 >> 12.1 >>9.9>>6.2 >.4.3 -c/n Rocephin/azithromycin pending further culture data -Blood cultures and urine cx NGTD -No further fevers -No hypoxia--- post ambulation O2 sats on room air is 97%  4)Syncope-no significant arrhythmia on telemetry monitored unit,  -Ruled out already for ACS by cardiac enzymes and EKG - echocardiogram without significant aortic stenosis or other outflow obstruction, and  EF is 60 to 65%, No segmental/Regional wall motion abnormalities.,  No diastolic dysfunction, mild LVH normal) -Carotid artery Dopplers with incidental finding of--Incidental note is made of acute occlusive thrombus throughout the right internal jugular vein. -No hemodynamically significant carotid artery stenosis BP stable A.m. cortisol was 4.7--- ACTH stimulation test with appropriate response (Cortisol- 16.4 >>21.5 >>22.3) --Patient presented with syncope and hypotension--- --blood pressure now is stable and patient asymptomatic.   5)Hypokalemia--- resolved with replacement - magnesium WNL,   6)Squamous cell Carcinoma of the Supraglottics (stage III (cT3, cN1, cM0)) in January 2025  --started radiation on 04/14/2023, chemotherapy with cisplatin on 04/19/2023,  He gets radiation Monday through Friday and takes chemo on Mondays. Last seen by primary oncologist--Dr. [name] on 05/14/23 -Discussed with the primary oncologist via secure chat -Discussed with radiation oncologist Dr. Basilio Cairo--- plans for possible radiation on Friday, 05/28/2023  7)Dysphagia/Odynophagia--- due  to supraglottic squamous cell carcinoma and subsequent radiotherapy--- --dietitian consult for adjustment of PEG tube feeding appreciated -Patient has had poor tolerance to bolus feeding dietitian recommends continues to fluid with Osmolite 1.5, with plans to transition to bolus feeding later if patient tolerates continuous feeding at goal rate --Tolerating Tube feeding well -@ Goal  8)Acute on chronic anemia---Hgb 8.8 from a baseline usually between 11 and 12 -Hemoccult positive --Currently on IV heparin as above #1 -Please see plan for GI eval as above #1 hold off on transition to DOAC for now due to recent Hgb dropping    Status is: Inpatient   Disposition: The patient is from: Home              Anticipated d/c is to: Home              Anticipated d/c date is: 2 days              Patient currently is not medically stable to d/c. Barriers: Not Clinically Stable-   Code Status :  -  Code Status: Full Code   Family Communication:     (patient is alert, awake and coherent) Discussed with wife   DVT Prophylaxis  :   - SCDs /Iv Heparin  SCDs Start: 05/21/23 0822 Place TED hose Start: 05/21/23 0822   Lab Results  Component Value Date   PLT 98 (L) 05/26/2023   Inpatient Medications  Scheduled Meds:  Chlorhexidine Gluconate Cloth  6 each Topical Daily   famotidine  40 mg Per Tube Daily   FLUoxetine  20 mg Per Tube Daily   free water  200 mL Per Tube Q4H   guaiFENesin-dextromethorphan  15 mL Per Tube Q6H WA   metoprolol tartrate  12.5 mg Oral BID   pantoprazole (PROTONIX) IV  40 mg Intravenous Q12H   QUEtiapine  50 mg Per Tube QHS   sodium chloride flush  10-40 mL Intracatheter Q12H   sodium chloride flush  3 mL Intravenous Q12H   sodium chloride flush  3 mL Intravenous Q12H   sucralfate  1 g Oral TID WC & HS   Continuous Infusions:  azithromycin 500 mg (05/26/23 1230)   cefTRIAXone (ROCEPHIN)  IV 2 g (05/26/23 1149)   feeding supplement (OSMOLITE 1.5 CAL) 1,000 mL  (05/22/23 1757)   heparin 1,450 Units/hr (05/26/23 1806)   PRN Meds:.acetaminophen **OR** acetaminophen, alum & mag hydroxide-simeth **AND** lidocaine, bisacodyl, HYDROmorphone (DILAUDID) injection, hydrOXYzine, ondansetron **OR** ondansetron (ZOFRAN) IV, oxyCODONE, polyethylene glycol, sodium chloride flush, sodium chloride flush   Anti-infectives (From admission, onward)    Start     Dose/Rate Route Frequency Ordered Stop   05/22/23 1215  cefTRIAXone (ROCEPHIN) 2 g in sodium chloride 0.9 % 100 mL IVPB        2 g 200 mL/hr over 30 Minutes Intravenous Every 24 hours 05/22/23 1115     05/22/23 1215  azithromycin (ZITHROMAX) 500 mg in sodium chloride 0.9 % 250 mL IVPB        500 mg 250 mL/hr over 60 Minutes Intravenous Every 24 hours 05/22/23 1115         Subjective: [name] in no acute distress.  Reports no nausea, no vomiting, no chest pain and not overt bleeding overnight.  Objective: Vitals:   05/25/23 1918 05/26/23 0600 05/26/23 0930 05/26/23 1219  BP: (!) 151/84 (!) 149/78 122/70 124/82  Pulse: 71 77 61 73  Resp: 18 16  17   Temp: 98.9 F (37.2  C) 99 F (37.2 C)  98 F (36.7 C)  TempSrc: Oral Oral    SpO2: 100% 100%  97%  Weight:      Height:       Filed Weights   05/21/23 0102 05/21/23 1648  Weight: 68 kg 63 kg    Physical Exam General exam: Alert, awake, oriented x 3; hoarse and demonstrating radiation injury to skin on his neck area.  No fever, reports no overt bleeding.  In no acute distress. Respiratory system: Clear to auscultation.  No using accessory muscles. Cardiovascular system:RRR. No rubs or gallops; no JVD. Gastrointestinal system: Abdomen is nondistended, soft and nontender.  Positive bowel sounds.  PEG tube in place. Central nervous system: No focal neurological deficits. Extremities: No cyanosis or clubbing. Skin: No petechiae; right sided Port-A-Cath in place; no signs of infection. Psychiatry: Flat affect appreciated.  Data Reviewed: I  have personally reviewed following labs and imaging studies  CBC: Recent Labs  Lab 05/21/23 0148 05/22/23 0418 05/23/23 0537 05/24/23 0535 05/25/23 0433 05/26/23 0203  WBC 3.2* 12.1* 9.9 6.2 4.3 3.7*  NEUTROABS 2.0  --   --   --   --   --   HGB 12.3* 10.9* 9.9* 9.2* 8.8* 8.8*  HCT 36.9* 31.6* 29.1* 27.2* 26.4* 27.0*  MCV 89.6 88.3 88.4 88.3 88.9 90.0  PLT 169 123* 97* 91* 92* 98*   Basic Metabolic Panel: Recent Labs  Lab 05/21/23 0148 05/21/23 0824 05/22/23 0418 05/25/23 0433 05/26/23 0203  NA 137  --  135 134* 135  K 2.9*  --  3.7 4.1 4.3  CL 101  --  107 102 99  CO2 22  --  22 25 26   GLUCOSE 104*  --  108* 109* 125*  BUN 13  --  15 11 10   CREATININE 1.14  --  1.13 0.83 0.91  CALCIUM 8.9  --  8.1* 8.1* 8.2*  MG  --  2.2  --   --   --   PHOS  --   --   --   --  4.3   GFR: Estimated Creatinine Clearance: 77.7 mL/min (by C-G formula based on SCr of 0.91 mg/dL).  Liver Function Tests: Recent Labs  Lab 05/21/23 0148 05/22/23 0418 05/26/23 0203  AST 14* 14*  --   ALT 14 13  --   ALKPHOS 44 41  --   BILITOT 0.6 0.3  --   PROT 7.2 6.0*  --   ALBUMIN 3.4* 2.8* 2.5*   Recent Results (from the past 240 hours)  Urine Culture     Status: None   Collection Time: 05/21/23  1:37 AM   Specimen: Urine, Clean Catch  Result Value Ref Range Status   Specimen Description   Final    URINE, CLEAN CATCH Performed at [facility], [address] [place], [place] [address]    Special Requests   Final    Normal Performed at [facility], [address], [place], [place] [address]    Culture   Final    NO GROWTH Performed at [facility], [address] [place], [place] [address]    Report Status 05/22/2023 FINAL  Final  Resp panel by RT-PCR (RSV, Flu A&B, Covid) Anterior Nasal Swab     Status: None   Collection Time: 05/21/23  1:48 AM   Specimen: Anterior Nasal Swab  Result Value Ref Range Status   SARS Coronavirus 2 by RT PCR NEGATIVE NEGATIVE Final  Comment:  (NOTE) SARS-CoV-2 target nucleic acids are NOT DETECTED.  The SARS-CoV-2 RNA is generally detectable in upper respiratory specimens during the acute phase of infection. The lowest concentration of SARS-CoV-2 viral copies this assay can detect is 138 copies/mL. A negative result does not preclude SARS-Cov-2 infection and should not be used as the sole basis for treatment or other patient management decisions. A negative result may occur with  improper specimen collection/handling, submission of specimen other than nasopharyngeal swab, presence of viral mutation(s) within the areas targeted by this assay, and inadequate number of viral copies(<138 copies/mL). A negative result must be combined with clinical observations, patient history, and epidemiological information. The expected result is Negative.  Fact Sheet for Patients:  BloggerCourse.com  Fact Sheet for Healthcare Providers:  SeriousBroker.it  This test is no t yet approved or cleared by the [place] FDA and  has been authorized for detection and/or diagnosis of SARS-CoV-2 by FDA under an Emergency Use Authorization (EUA). This EUA will remain  in effect (meaning this test can be used) for the duration of the COVID-19 declaration under Section 564(b)(1) of the Act, 21 U.S.C.section 360bbb-3(b)(1), unless the authorization is terminated  or revoked sooner.       Influenza A by PCR NEGATIVE NEGATIVE Final   Influenza B by PCR NEGATIVE NEGATIVE Final    Comment: (NOTE) The Xpert Xpress SARS-CoV-2/FLU/RSV plus assay is intended as an aid in the diagnosis of influenza from Nasopharyngeal swab specimens and should not be used as a sole basis for treatment. Nasal washings and aspirates are unacceptable for Xpert Xpress SARS-CoV-2/FLU/RSV testing.  Fact Sheet for Patients: BloggerCourse.com  Fact Sheet for Healthcare  Providers: SeriousBroker.it  This test is not yet approved or cleared by the [place] FDA and has been authorized for detection and/or diagnosis of SARS-CoV-2 by FDA under an Emergency Use Authorization (EUA). This EUA will remain in effect (meaning this test can be used) for the duration of the COVID-19 declaration under Section 564(b)(1) of the Act, 21 U.S.C. section 360bbb-3(b)(1), unless the authorization is terminated or revoked.     Resp Syncytial Virus by PCR NEGATIVE NEGATIVE Final    Comment: (NOTE) Fact Sheet for Patients: BloggerCourse.com  Fact Sheet for Healthcare Providers: SeriousBroker.it  This test is not yet approved or cleared by the [place] FDA and has been authorized for detection and/or diagnosis of SARS-CoV-2 by FDA under an Emergency Use Authorization (EUA). This EUA will remain in effect (meaning this test can be used) for the duration of the COVID-19 declaration under Section 564(b)(1) of the Act, 21 U.S.C. section 360bbb-3(b)(1), unless the authorization is terminated or revoked.  Performed at [facility], [address] [place], [place] [address]   Culture, blood (routine x 2)     Status: None   Collection Time: 05/21/23  2:07 AM   Specimen: BLOOD  Result Value Ref Range Status   Specimen Description BLOOD PORTA CATH  Final   Special Requests   Final    BOTTLES DRAWN AEROBIC AND ANAEROBIC Blood Culture adequate volume   Culture   Final    NO GROWTH 5 DAYS Performed at [facility], [address], [place], [place] [address]    Report Status 05/26/2023 FINAL  Final  Culture, blood (routine x 2)     Status: None   Collection Time: 05/21/23  2:07 AM   Specimen: BLOOD  Result Value Ref Range Status   Specimen Description BLOOD LEFT ANTECUBITAL  Final   Special  Requests   Final    BOTTLES DRAWN AEROBIC AND ANAEROBIC Blood Culture adequate volume   Culture    Final    NO GROWTH 5 DAYS Performed at [facility], [address], [place], [place] [address]    Report Status 05/26/2023 FINAL  Final    Radiology Studies: No results found.  Scheduled Meds:  Chlorhexidine Gluconate Cloth  6 each Topical Daily   famotidine  40 mg Per Tube Daily   FLUoxetine  20 mg Per Tube Daily   free water  200 mL Per Tube Q4H   guaiFENesin-dextromethorphan  15 mL Per Tube Q6H WA   metoprolol tartrate  12.5 mg Oral BID   pantoprazole (PROTONIX) IV  40 mg Intravenous Q12H   QUEtiapine  50 mg Per Tube QHS   sodium chloride flush  10-40 mL Intracatheter Q12H   sodium chloride flush  3 mL Intravenous Q12H   sodium chloride flush  3 mL Intravenous Q12H   sucralfate  1 g Oral TID WC & HS   Continuous Infusions:  azithromycin 500 mg (05/26/23 1230)   cefTRIAXone (ROCEPHIN)  IV 2 g (05/26/23 1149)   feeding supplement (OSMOLITE 1.5 CAL) 1,000 mL (05/22/23 1757)   heparin 1,450 Units/hr (05/26/23 1806)    LOS: 5 days   Vassie Loll M.D on 05/26/2023 at 6:37 PM  Go to [email or web address] - for contact info  [facility] - Office  [phone]  If 7PM-7AM, please contact night-coverage [email or web address] 05/26/2023, 6:37 PM

## 2023-05-26 NOTE — Progress Notes (Signed)
 PHARMACY - ANTICOAGULATION CONSULT NOTE  Pharmacy Consult for Heparin Indication: DVT  No Known Allergies  Patient Measurements: Height: 5\' 4"  (162.6 cm) Weight: 63 kg (139 lb) IBW/kg (Calculated) : 59.2 HEPARIN DW (KG): 63   Vital Signs: Temp: 98.9 F (37.2 C) (02/25 1918) Temp Source: Oral (02/25 1918) BP: 151/84 (02/25 1918) Pulse Rate: 71 (02/25 1918)  Labs: Recent Labs    05/24/23 0535 05/24/23 1434 05/25/23 0433 05/25/23 1153 05/25/23 2014 05/26/23 0203  HGB 9.2*  --  8.8*  --   --  8.8*  HCT 27.2*  --  26.4*  --   --  27.0*  PLT 91*  --  92*  --   --  98*  HEPARINUNFRC 0.10*   < >  --  0.24* 0.30 0.39  CREATININE  --   --  0.83  --   --  0.91   < > = values in this interval not displayed.    Estimated Creatinine Clearance: 77.7 mL/min (by C-G formula based on SCr of 0.91 mg/dL).   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   Substance induced mood disorder (HCC) 11/05/2016   Assessment: 53 year old male with past medical history of throat cancer currently undergoing chemotherapy and radiation. Patient brought by EMS after a syncopal episode, Patient had bilateral carotid duplex u/s and showed acute occlusive thrombus throughout the right internal jugular vein. Not on oral anticoagulants prior to admission. Pharmacy asked to start heparin   HL 0.30 - therapeutic x1 on 1450 units/hour Hgb 12.3> 10.9> 9.9> 9.2>8.8 , stool occult positive  2/26 AM update:  Heparin level therapeutic x 2  Hgb stable from yesterday  Goal of Therapy:  Heparin level 0.3-0.7 units/ml Monitor platelets by anticoagulation protocol: Yes   Plan:  Continue heparin  infusion at 1450 units/hr Daily heparin level and CBC Trend Hgb   Abran Duke, PharmD, BCPS Clinical Pharmacist Phone: [phone]

## 2023-05-26 NOTE — Progress Notes (Signed)
 Mobility Specialist Progress Note:    05/26/23 1433  Mobility  Activity Refused mobility   Pt politely refused mobility d/t having a rough day. All needs met.   [name] Mobility Specialist Please contact via Special educational needs teacher or  Rehab office at [phone]

## 2023-05-26 NOTE — Plan of Care (Signed)

## 2023-05-27 ENCOUNTER — Ambulatory Visit: Payer: Medicare HMO

## 2023-05-27 DIAGNOSIS — K219 Gastro-esophageal reflux disease without esophagitis: Secondary | ICD-10-CM

## 2023-05-27 DIAGNOSIS — R55 Syncope and collapse: Secondary | ICD-10-CM | POA: Diagnosis not present

## 2023-05-27 DIAGNOSIS — J189 Pneumonia, unspecified organism: Secondary | ICD-10-CM

## 2023-05-27 DIAGNOSIS — A419 Sepsis, unspecified organism: Secondary | ICD-10-CM

## 2023-05-27 DIAGNOSIS — I82C11 Acute embolism and thrombosis of right internal jugular vein: Secondary | ICD-10-CM | POA: Diagnosis not present

## 2023-05-27 DIAGNOSIS — I10 Essential (primary) hypertension: Secondary | ICD-10-CM | POA: Diagnosis not present

## 2023-05-27 LAB — CBC
HCT: 28.3 % — ABNORMAL LOW (ref 39.0–52.0)
Hemoglobin: 9.1 g/dL — ABNORMAL LOW (ref 13.0–17.0)
MCH: 28.9 pg (ref 26.0–34.0)
MCHC: 32.2 g/dL (ref 30.0–36.0)
MCV: 89.8 fL (ref 80.0–100.0)
Platelets: 113 10*3/uL — ABNORMAL LOW (ref 150–400)
RBC: 3.15 MIL/uL — ABNORMAL LOW (ref 4.22–5.81)
RDW: 13.3 % (ref 11.5–15.5)
WBC: 3.4 10*3/uL — ABNORMAL LOW (ref 4.0–10.5)
nRBC: 0 % (ref 0.0–0.2)

## 2023-05-27 LAB — HEPARIN LEVEL (UNFRACTIONATED): Heparin Unfractionated: 0.32 [IU]/mL (ref 0.30–0.70)

## 2023-05-27 MED ORDER — METOPROLOL TARTRATE 25 MG PO TABS: 12.5000 mg | ORAL_TABLET | Freq: Two times a day (BID) | ORAL | 1 refills | Status: DC

## 2023-05-27 MED ORDER — AMOXICILLIN-POT CLAVULANATE 600-42.9 MG/5ML PO SUSR: 875.0000 mg | Freq: Two times a day (BID) | ORAL | 0 refills | Status: AC

## 2023-05-27 MED ORDER — GUAIFENESIN-DM 100-10 MG/5ML PO SYRP: 15.0000 mL | ORAL_SOLUTION | Freq: Four times a day (QID) | ORAL | 0 refills | Status: DC

## 2023-05-27 MED ORDER — APIXABAN 5 MG PO TABS
10.0000 mg | ORAL_TABLET | Freq: Two times a day (BID) | ORAL | Status: DC
  Administered 2023-05-27: 10 mg via ORAL
  Filled 2023-05-27: qty 2

## 2023-05-27 MED ORDER — OXYCODONE HCL 5 MG PO TABS: 5.0000 mg | ORAL_TABLET | ORAL | 0 refills | Status: DC | PRN

## 2023-05-27 MED ORDER — FAMOTIDINE 40 MG PO TABS: 40.0000 mg | ORAL_TABLET | Freq: Every day | ORAL | Status: DC

## 2023-05-27 MED ORDER — APIXABAN 5 MG PO TABS: 5.0000 mg | ORAL_TABLET | Freq: Two times a day (BID) | ORAL | Status: DC

## 2023-05-27 MED ORDER — SUCRALFATE 1 GM/10ML PO SUSP: 1.0000 g | Freq: Three times a day (TID) | ORAL | 0 refills | Status: DC

## 2023-05-27 MED ORDER — APIXABAN 5 MG PO TABS: ORAL_TABLET | ORAL | 0 refills | Status: DC

## 2023-05-27 MED ORDER — PANTOPRAZOLE SODIUM 40 MG PO TBEC: 40.0000 mg | DELAYED_RELEASE_TABLET | Freq: Two times a day (BID) | ORAL | 2 refills | Status: DC

## 2023-05-27 NOTE — Discharge Instructions (Addendum)
 Information on my medicine - ELIQUIS (apixaban)  This medication education was reviewed with me or my healthcare representative as part of my discharge preparation.  Why was Eliquis prescribed for you? Eliquis was prescribed to treat blood clots that may have been found in the veins of your legs (deep vein thrombosis) or in your lungs (pulmonary embolism) and to reduce the risk of them occurring again.  What do You need to know about Eliquis ? The starting dose is 10 mg (two 5 mg tablets) taken TWICE daily for the FIRST SEVEN (7) DAYS, then on 06/03/2023 the dose is reduced to ONE 5 mg tablet taken TWICE daily.  Eliquis may be taken with or without food.   Try to take the dose about the same time in the morning and in the evening. If you have difficulty swallowing the tablet whole please discuss with your pharmacist how to take the medication safely.  Take Eliquis exactly as prescribed and DO NOT stop taking Eliquis without talking to the doctor who prescribed the medication.  Stopping may increase your risk of developing a new blood clot.  Refill your prescription before you run out.  After discharge, you should have regular check-up appointments with your healthcare provider that is prescribing your Eliquis.    What do you do if you miss a dose? If a dose of ELIQUIS is not taken at the scheduled time, take it as soon as possible on the same day and twice-daily administration should be resumed. The dose should not be doubled to make up for a missed dose.  Important Safety Information A possible side effect of Eliquis is bleeding. You should call your healthcare provider right away if you experience any of the following: Bleeding from an injury or your nose that does not stop. Unusual colored urine (red or dark brown) or unusual colored stools (red or black). Unusual bruising for unknown reasons. A serious fall or if you hit your head (even if there is no bleeding).  Some medicines  may interact with Eliquis and might increase your risk of bleeding or clotting while on Eliquis. To help avoid this, consult your healthcare provider or pharmacist prior to using any new prescription or non-prescription medications, including herbals, vitamins, non-steroidal anti-inflammatory drugs (NSAIDs) and supplements.  This website has more information on Eliquis (apixaban): http://www.eliquis.com/eliquis/home

## 2023-05-27 NOTE — TOC Transition Note (Signed)
 Transition of Care [facility]) - Discharge Note   Patient Details  Name: [name] MRN: [id number] Date of Birth: 1970/05/13  [facility] CM/SW Contact:  Elliot Gault, LCSW Phone Number: 05/27/2023, 12:12 PM   Clinical Narrative:     Pt stable for dc home with resumption of HH per MD. Updated Victorino Dike at [facility] and they will follow up with pt at home.  No other TOC needs for dc.  Final next level of care: Home w Home Health Services Barriers to Discharge: Barriers Resolved   Patient Goals and CMS Choice Patient states their goals for this hospitalization and ongoing recovery are:: to go home CMS Medicare.gov Compare Post Acute Care list provided to:: Patient Choice offered to / list presented to : Patient      Discharge Placement                       Discharge Plan and Services Additional resources added to the After Visit Summary for       Post Acute Care Choice: Resumption of Svcs/PTA Provider                    HH Arranged: RN [facility] Agency: [facility] Date [facility] Agency Contacted: 05/23/23 Time HH Agency Contacted: [phone] Representative spoke with at [facility] Agency: [place]  Social Drivers of Health (SDOH) Interventions SDOH Screenings   Food Insecurity: No Food Insecurity (05/21/2023)  Housing: Low Risk  (05/21/2023)  Transportation Needs: No Transportation Needs (05/21/2023)  Utilities: Not At Risk (05/21/2023)  Alcohol Screen: Medium Risk (06/23/2017)  Depression (PHQ2-9): High Risk (03/29/2023)  Tobacco Use: Medium Risk (05/21/2023)     Readmission Risk Interventions    05/22/2023    4:02 PM 05/07/2023   11:49 AM  Readmission Risk Prevention Plan  Transportation Screening Complete Complete  PCP or Specialist Appt within 3-5 Days Complete Complete  HRI or Home Care Consult Complete Complete  Social Work Consult for Recovery Care Planning/Counseling Complete Complete  Palliative Care Screening Complete Not Applicable   Medication Review Oceanographer) Complete Complete

## 2023-05-27 NOTE — Discharge Summary (Signed)
 Physician Discharge Summary   Patient: RMANI KELLOGG MRN: [id number] DOB: [birth year]  Admit date:     05/21/2023  Discharge date: 05/27/23  Discharge Physician: Vassie Loll   PCP: [name], MD   Recommendations at discharge:  Reassessment pressure and adjust antihypertensive as needed Repeat CBC to follow hemoglobin trend/stability Repeat basic metabolic panel to follow renal function and electrolytes stability.  Discharge Diagnoses: Principal Problem:   Internal jugular (IJ) vein thromboembolism, acute, right (/Acute DVT of Right IJV- acute occlusive thrombus throughout the right internal jugular vein. Active Problems:   Acute DVT of Right IJV- acute occlusive thrombus throughout the right internal jugular vein.   CAP (community acquired pneumonia)   Sepsis due to pneumonia (HCC)   Chronic hepatitis C virus infection (HCC) G1a   Essential hypertension   Malignant neoplasm of supraglottis (HCC)   Port-A-Cath in place   Syncope   Brief Hospital admission narrative : As per H&P written by Dr. Mariea Clonts on 05/21/2023 [name] is a 53 y.o. 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  Patient has poor recollection of events he said he just ended up on the floor- -family says patient was hallucinating and talking to somebody who was in the air when he came around -In the ED patient is alert and oriented x 4..  Denies chest pains palpitations or dizziness -Patient with fevers up to 101.1 --has Nausea and poor appetite but no emesis and no  diarrhea -Patient reports fatigue and generalized weakness -he does radiation Monday through Friday and takes chemo on Mondays. -In the ED COVID, flu and RSV negative -Chest x-ray without acute cardiopulmonary findings -LE Extremity venous Dopplers without DVT -CTA chest without PE, emphysema noted, bronchitis noted -Carotid artery Dopplers with incidental finding of--Incidental note is made of acute occlusive thrombus throughout the right internal jugular vein. -No hemodynamically significant carotid artery stenosis -Lactic acid 3.7 repeat 2.3--- IV fluids ordered -UA not suggestive of UTI, blood cultures requested -A.m. cortisol is 4.7--- ACTH stimulation test ordered -Lipase WNL EKG is normal sinus rhythm without acute findings, troponin requested -WBC 3.2, hemoglobin 12.3 platelets 169 -Sodium 137, potassium 2.9, magnesium 2.2, creatinine 1.1, LFTs are not elevated  Assessment and Plan: 1)Acute on Chronic Symptomatic Anemia--- Hgb 12.5 on 05/21/23 , Hgb is down to 8.8 chronic anemia at baseline due to underlying malignancy and chemoradiation treatments --- now with worsening anemia due to Acute Blood loss while on IV heparin for acute right IJ DVT.  -Heme +ve Stool with drop in Hgb while on iv Heparin ---  pt has Rt  IJ Vein DVT in the setting of underlying malignancy and Rt Sided [place]-A-cath---so he needs lifelong full anticoagulation------anticipate that he will Need EGD prior to switching from iv Heparin to Eliquis  -GI consult appreciated; continue PPI twice a day along with Carafate -Continue PEG tube. -If hemoglobin continues to drop patient will need to be subsequently admitted to higher level of care facility for endoscopic evaluation.   2)Acute DVT----Carotid artery Dopplers with incidental finding of--Incidental note is made of acute occlusive thrombus throughout the right internal jugular vein----in  the setting of malignancy and right-sided Port-A-Cath in situ -No  hemodynamically significant carotid artery stenosis -Hemoglobin 9.1 and trending up; will transition to oral Eliquis 10 mg twice a day for 7 days and subsequently 5 mg twice a day.  Outpatient follow-up with PCP and oncology service. -Echo as noted as in #4 below   3)Sepsis secondary to CAP with persistent fevers febrile illness---- -COVID, flu and RSV negative --CTA chest without PE, emphysema noted, bronchitis and possible pneumonia --Lactic acid 3.7 repeat 2.3--- received IV fluids per sepsis protocol. -UA not suggestive of UTI --WBC 3.2 >> 12.1 >>9.9>>6.2 >.4.3 -Patient has been transition to Augmentin suspension through PEG tube to complete antibiotic therapy at discharge. -Blood cultures and urine cx NGTD -No further fevers -No hypoxia--- post ambulation O2 sats on room air is 97%   4)Syncope-no significant arrhythmia on telemetry monitored unit,  -Ruled out already for ACS by cardiac enzymes and EKG - echocardiogram without significant aortic stenosis or other outflow obstruction, and  EF is 60 to 65%, No segmental/Regional wall motion abnormalities.,  No diastolic dysfunction, mild LVH normal) -Carotid artery Dopplers with incidental finding of--Incidental note is made of acute occlusive thrombus throughout the right internal jugular vein. -No hemodynamically significant carotid artery stenosis BP stable A.m. cortisol was 4.7--- ACTH stimulation test with appropriate response (Cortisol- 16.4 >>21.5 >>22.3) --Patient presented with syncope and hypotension--- --blood pressure now is stable and rising; patient asymptomatic. -Continue just half dose of patient's metoprolol at time of discharge.   5)Hypokalemia--- resolved with replacement - magnesium WNL,    6)Squamous cell Carcinoma of the Supraglottics (stage III (cT3, cN1, cM0)) in January 2025  --started radiation on 04/14/2023, chemotherapy with cisplatin on 04/19/2023,  He gets radiation Monday through Friday and takes chemo on  Mondays. Last seen by primary oncologist--Dr. Meryl Crutch on 05/14/23 -Discussed with the primary oncologist via secure chat -Discussed with radiation oncologist Dr. Basilio Cairo--- plans for possible radiation on Friday, 05/28/2023   7)Dysphagia/Odynophagia--- due to supraglottic squamous cell carcinoma and subsequent radiotherapy--- --dietitian consult for adjustment of PEG tube feeding appreciated -Patient has had poor tolerance to bolus feeding dietitian recommends continues to fluid with Osmolite 1.5, with plans to transition to bolus feeding later if patient tolerates continuous feeding at goal rate --Tolerating Tube feeding well -@ Goal   8)Acute on chronic anemia---Hgb 9.1 from a baseline usually between 11 and 12 at time of discharge. -Hemoccult was found to be positive. -Patient high risk for endoscopic evaluation secondary to ongoing compromise airways with supraglottic malignancy and history of radiation. -After following conservative management with PPI and Carafate hemoglobin started to trend up and no further signs of bleeding appreciated. -Continue outpatient follow-up. -Hemoglobin down to 8.8 at the lowest.  Consultants: Gastroenterology service, oncology service curbside through secure chat; Procedures performed: See below for x-ray reports. Disposition: Home with home health services. Diet recommendation: Continue tube feedings.  DISCHARGE MEDICATION: Allergies as of 05/27/2023   No Known Allergies      Medication List     STOP taking these medications    amlodipine-olmesartan 10-20 MG tablet Commonly known as: AZOR   metoprolol succinate 25 MG 24 hr tablet Commonly known as: TOPROL-XL   nicotine 21 mg/24hr patch Commonly known as: NICODERM CQ - dosed in mg/24 hours   scopolamine 1 MG/3DAYS Commonly known as: TRANSDERM-SCOP       TAKE these medications    acetaminophen 500 MG tablet Commonly known as: TYLENOL Take 1,000 mg by mouth every 6 (six)  hours  as needed for mild pain (pain score 1-3) or moderate pain (pain score 4-6).   amoxicillin-clavulanate 600-42.9 MG/5ML suspension Commonly known as: AUGMENTIN Take 7.3 mLs (875 mg total) by mouth 2 (two) times daily for 3 days.   apixaban 5 MG Tabs tablet Commonly known as: ELIQUIS Take 2 tablets (10 mg total) by mouth 2 (two) times daily for 7 days, THEN 1 tablet (5 mg total) 2 (two) times daily. Start taking on: May 27, 2023   dexamethasone 4 MG tablet Commonly known as: DECADRON Take 2 tablets (8 mg) by mouth daily x 3 days starting the day after cisplatin chemotherapy. Take with food.   famotidine 40 MG tablet Commonly known as: Pepcid Take 1 tablet (40 mg total) by mouth at bedtime. What changed: when to take this   feeding supplement (OSMOLITE 1.5 CAL) Liqd Place 60 mL/hr into feeding tube continuous. 7 Cartons of Osmolite 1.5 per day + an additional free water from flushes For long-term What changed: additional instructions   feeding supplement (PROSource TF20) liquid Place 60 mLs into feeding tube daily.   FLUoxetine 20 MG capsule Commonly known as: PROZAC Take 1 capsule (20 mg total) by mouth daily.   guaiFENesin-dextromethorphan 100-10 MG/5ML syrup Commonly known as: ROBITUSSIN DM Place 15 mLs into feeding tube every 6 (six) hours.   hydrOXYzine 25 MG tablet Commonly known as: ATARAX Take 1 tablet (25 mg total) by mouth 3 (three) times daily as needed for anxiety. What changed: when to take this   lidocaine 2 % solution Commonly known as: XYLOCAINE Patient: Mix 1part 2% viscous lidocaine, 1part H20. Swallow 10mL of diluted mixture, before meals and at bedtime, up to QID   lidocaine-prilocaine cream Commonly known as: EMLA Apply to affected area once   metoprolol tartrate 25 MG tablet Commonly known as: LOPRESSOR Take 0.5 tablets (12.5 mg total) by mouth 2 (two) times daily.   ondansetron 8 MG tablet Commonly known as: Zofran Take 1  tablet (8 mg total) by mouth every 8 (eight) hours as needed for nausea or vomiting. Start on the third day after cisplatin.   oxyCODONE 5 MG immediate release tablet Commonly known as: Oxy IR/ROXICODONE Take 1 tablet (5 mg total) by mouth every 4 (four) hours as needed for severe pain (pain score 7-10).   pantoprazole 40 MG tablet Commonly known as: Protonix Take 1 tablet (40 mg total) by mouth 2 (two) times daily.   prochlorperazine 10 MG tablet Commonly known as: COMPAZINE TAKE 1 TABLET(10 MG) BY MOUTH EVERY 6 HOURS AS NEEDED FOR NAUSEA OR VOMITING   QUEtiapine 50 MG tablet Commonly known as: SEROQUEL Take one pill nightly for 1 week. Then increase to 2 pills nightly for 1 week. Then increase to 3 pills nightly for one week. Then increase to 4 pills nightly for one week. What changed:  how much to take how to take this when to take this Another medication with the same name was removed. Continue taking this medication, and follow the directions you see here.   sucralfate 1 GM/10ML suspension Commonly known as: CARAFATE Place 10 mLs (1 g total) into feeding tube in the morning, at noon, and at bedtime for 20 days.        Follow-up Information     [facility].   Specialty: [facility] Contact information: [address] [place] [address] [phone]         [name],  MD. Schedule an appointment as soon as possible for a visit in 10 day(s).   Specialty: Internal Medicine Contact information: [address] [place] [address] [phone]                Discharge Exam: Ceasar Mons Weights   05/21/23 0102 05/21/23 1648  Weight: 68 kg 63 kg   General exam: Alert, awake, oriented x 3; hoarse and demonstrating radiation injury to skin on his neck area.  No fever, reports no overt bleeding.  In no acute distress. Respiratory system: Clear to auscultation.  No using accessory muscles. Cardiovascular system:RRR.  No rubs or gallops; no JVD. Gastrointestinal system: Abdomen is nondistended, soft and nontender.  Positive bowel sounds.  PEG tube in place. Central nervous system: No focal neurological deficits. Extremities: No cyanosis or clubbing. Skin: No petechiae; right sided Port-A-Cath in place; no signs of infection. Psychiatry: Flat affect appreciated.  Condition at discharge: Stable and improved.  The results of significant diagnostics from this hospitalization (including imaging, microbiology, ancillary and laboratory) are listed below for reference.   Imaging Studies: ECHOCARDIOGRAM COMPLETE Result Date: 05/22/2023    ECHOCARDIOGRAM REPORT   Patient Name:   YANNI QUIROA Date of Exam: 05/22/2023 Medical Rec #:  [id number]      Height:       64.0 in Accession #:    [id number]     Weight:       139.0 lb Date of Birth:  January 16, 1971     BSA:          1.676 m Patient Age:    52 years       BP:           136/85 mmHg Patient Gender: M              HR:           72 bpm. [facility] Procedure: 2D Echo, 3D Echo, Cardiac Doppler, Color Doppler and Strain Analysis            (Both Spectral and Color Flow Doppler were utilized during            procedure). Indications:    Syncope  History:        Patient has prior history of Echocardiogram examinations, most                 recent 12/24/2016. Risk Factors:Hypertension and Former Smoker.  Sonographer:    Karma Ganja Referring Phys: [id number] COURAGE EMOKPAE  Sonographer Comments: Global longitudinal strain was attempted. IMPRESSIONS  1. Left ventricular ejection fraction, by estimation, is 60 to 65%. The left ventricle has normal function. The left ventricle has no regional wall motion abnormalities. Left ventricular diastolic parameters were normal. The average left ventricular global longitudinal strain is -18.2 %. The global longitudinal strain is normal.  2. Right ventricular systolic function is normal. The right ventricular size is normal.  3. The  mitral valve is normal in structure. No evidence of mitral valve regurgitation. No evidence of mitral stenosis.  4. The aortic valve is normal in structure. Aortic valve regurgitation is not visualized. No aortic stenosis is present.  5. The inferior vena cava is normal in size with greater than 50% respiratory variability, suggesting right atrial pressure of 3 mmHg. FINDINGS  Left Ventricle: Left ventricular ejection fraction, by estimation, is 60 to 65%. The left ventricle has normal function. The left ventricle has no regional wall motion abnormalities. The average left ventricular global  longitudinal strain is -18.2 %. Strain was performed and the global longitudinal strain is normal. 3D ejection fraction reviewed and evaluated as part of the interpretation. Alternate measurement of EF is felt to be most reflective of LV function. The left ventricular internal cavity size was normal in size. There is no left ventricular hypertrophy. Left ventricular diastolic parameters were normal. Right Ventricle: The right ventricular size is normal. No increase in right ventricular wall thickness. Right ventricular systolic function is normal. Left Atrium: Left atrial size was normal in size. Right Atrium: Right atrial size was normal in size. Pericardium: There is no evidence of pericardial effusion. Mitral Valve: The mitral valve is normal in structure. No evidence of mitral valve regurgitation. No evidence of mitral valve stenosis. Tricuspid Valve: The tricuspid valve is normal in structure. Tricuspid valve regurgitation is not demonstrated. No evidence of tricuspid stenosis. Aortic Valve: The aortic valve is normal in structure. Aortic valve regurgitation is not visualized. No aortic stenosis is present. Aortic valve mean gradient measures 6.0 mmHg. Aortic valve peak gradient measures 11.0 mmHg. Aortic valve area, by VTI measures 2.14 cm. Pulmonic Valve: The pulmonic valve was not well visualized. Aorta: The aortic root  is normal in size and structure. Venous: The inferior vena cava is normal in size with greater than 50% respiratory variability, suggesting right atrial pressure of 3 mmHg. IAS/Shunts: No atrial level shunt detected by color flow Doppler. Additional Comments: 3D was performed not requiring image post processing on an independent workstation and was indeterminate.  LEFT VENTRICLE PLAX 2D LVIDd:         4.30 cm   Diastology LVIDs:         2.90 cm   LV e' medial:    9.79 cm/s LV PW:         0.80 cm   LV E/e' medial:  9.0 LV IVS:        0.90 cm   LV e' lateral:   15.80 cm/s LVOT diam:     1.90 cm   LV E/e' lateral: 5.6 LV SV:         62 LV SV Index:   37        2D Longitudinal Strain LVOT Area:     2.84 cm  2D Strain GLS Avg:     -18.2 %                           3D Volume EF:                          3D EF:        52 %                          LV EDV:       182 ml                          LV ESV:       87 ml                          LV SV:        95 ml RIGHT VENTRICLE RV Basal diam:  4.10 cm RV S prime:     17.60 cm/s TAPSE (M-mode): 2.6 cm LEFT ATRIUM  Index        RIGHT ATRIUM           Index LA diam:        2.90 cm 1.73 cm/m   RA Area:     10.20 cm LA Vol (A2C):   65.5 ml 39.08 ml/m  RA Volume:   19.20 ml  11.46 ml/m LA Vol (A4C):   28.2 ml 16.82 ml/m LA Biplane Vol: 43.0 ml 25.66 ml/m  AORTIC VALVE AV Area (Vmax):    2.03 cm AV Area (Vmean):   2.10 cm AV Area (VTI):     2.14 cm AV Vmax:           166.00 cm/s AV Vmean:          106.000 cm/s AV VTI:            0.290 m AV Peak Grad:      11.0 mmHg AV Mean Grad:      6.0 mmHg LVOT Vmax:         119.00 cm/s LVOT Vmean:        78.400 cm/s LVOT VTI:          0.219 m LVOT/AV VTI ratio: 0.76  AORTA Ao Root diam: 3.10 cm MITRAL VALVE MV Area (PHT): 4.06 cm    SHUNTS MV Decel Time: 187 msec    Systemic VTI:  0.22 m MV E velocity: 87.80 cm/s  Systemic Diam: 1.90 cm MV A velocity: 75.00 cm/s MV E/A ratio:  1.17 Mihai Croitoru MD Electronically signed by  Thurmon Fair MD Signature Date/Time: 05/22/2023/12:49:20 PM    Final    US Carotid Bilateral Result Date: 05/21/2023 CLINICAL DATA:  Syncope EXAM: BILATERAL CAROTID DUPLEX ULTRASOUND TECHNIQUE: Wallace Cullens scale imaging, color Doppler and duplex ultrasound were performed of bilateral carotid and vertebral arteries in the neck. COMPARISON:  None Available. FINDINGS: Criteria: Quantification of carotid stenosis is based on velocity parameters that correlate the residual internal carotid diameter with NASCET-based stenosis levels, using the diameter of the distal internal carotid lumen as the denominator for stenosis measurement. The following velocity measurements were obtained: RIGHT ICA: 114/35 cm/sec CCA: 118/16 cm/sec SYSTOLIC ICA/CCA RATIO:  1.3 ECA:  68 cm/sec LEFT ICA: 92/29 cm/sec CCA: 106/26 cm/sec SYSTOLIC ICA/CCA RATIO:  1.0 ECA:  84 cm/sec RIGHT CAROTID ARTERY: Mild heterogeneous atherosclerotic plaque in the proximal internal carotid artery. By peak systolic velocity criteria, the estimated stenosis is less than 50%. RIGHT VERTEBRAL ARTERY:  Patent with normal antegrade flow. LEFT CAROTID ARTERY: Mild heterogeneous atherosclerotic plaque in the proximal internal carotid artery. By peak systolic velocity criteria, the estimated stenosis is less than 50%. LEFT VERTEBRAL ARTERY:  Patent with normal antegrade flow. Other: Incidental note is made of thrombus within the right internal jugular vein. The vein is noncompressible in there is no evidence of color flow. Findings are consistent with acute occlusive thrombus. IMPRESSION: 1. Incidental note is made of acute occlusive thrombus throughout the right internal jugular vein. 2. Mild (1-49%) stenosis proximal right internal carotid artery secondary to heterogenous atherosclerotic plaque. 3. Mild (1-49%) stenosis proximal left internal carotid artery secondary to heterogenous atherosclerotic plaque. 4. Vertebral arteries are patent with normal antegrade flow.  Electronically Signed   By: Malachy Moan M.D.   On: 05/21/2023 13:55   CT Angio Chest Pulmonary Embolism (PE) W or WO Contrast Result Date: 05/21/2023 CLINICAL DATA:  History of laryngeal cancer with syncopal episode, shortness of breath, and weakness. Altered mental status EXAM: CT ANGIOGRAPHY CHEST WITH  CONTRAST TECHNIQUE: Multidetector CT imaging of the chest was performed using the standard protocol during bolus administration of intravenous contrast. Multiplanar CT image reconstructions and MIPs were obtained to evaluate the vascular anatomy. RADIATION DOSE REDUCTION: This exam was performed according to the departmental dose-optimization program which includes automated exposure control, adjustment of the mA and/or kV according to patient size and/or use of iterative reconstruction technique. CONTRAST:  75mL OMNIPAQUE IOHEXOL 350 MG/ML SOLN COMPARISON:  Same day chest radiograph FINDINGS: Cardiovascular: Right chest wall port terminates in the right atrium. The study is high quality for the evaluation of pulmonary embolism. There are no filling defects in the central, lobar, segmental or subsegmental pulmonary artery branches to suggest acute pulmonary embolism. Great vessels are normal in course and caliber. Normal heart size. No significant pericardial fluid/thickening. Coronary artery calcifications and aortic atherosclerosis. Mediastinum/Nodes: Imaged thyroid gland without nodules meeting criteria for imaging follow-up by size. Normal esophagus. No pathologically enlarged axillary, supraclavicular, mediastinal, or hilar lymph nodes. Lungs/Pleura: The central airways are patent. Layering secretions within the trachea extending into bilateral bronchi with left lower lobe subsegmental mucous plugging. Mild diffuse bronchial wall thickening. Minimal right apical paraseptal emphysema. Medial bilateral lower lobe tree-in-bud and ground-glass nodules. No pneumothorax. No pleural effusion. Upper abdomen:  Partially imaged percutaneous gastrostomy tube in-situ. Musculoskeletal: No acute or abnormal lytic or blastic osseous lesions. Review of the MIP images confirms the above findings. IMPRESSION: 1. No evidence of pulmonary embolism. 2. Findings of bronchitis with medial bilateral lower lobe tree-in-bud and ground-glass nodules, likely infectious/inflammatory. 3. Aortic Atherosclerosis (ICD10-I70.0) and Emphysema (ICD10-J43.9). Coronary artery calcifications. Assessment for potential risk factor modification, dietary therapy or pharmacologic therapy may be warranted, if clinically indicated. Electronically Signed   By: Agustin Cree M.D.   On: 05/21/2023 12:01   US Venous Img Lower Bilateral (DVT) Result Date: 05/21/2023 CLINICAL DATA:  Pulmonary emboli (HCC) EXAM: BILATERAL LOWER EXTREMITY VENOUS DOPPLER ULTRASOUND TECHNIQUE: Gray-scale sonography with graded compression, as well as color Doppler and duplex ultrasound were performed to evaluate the lower extremity deep venous systems from the level of the common femoral vein and including the common femoral, femoral, profunda femoral, popliteal and calf veins including the posterior tibial, peroneal and gastrocnemius veins when visible. The superficial great saphenous vein was also interrogated. Spectral Doppler was utilized to evaluate flow at rest and with distal augmentation maneuvers in the common femoral, femoral and popliteal veins. COMPARISON:  None Available. FINDINGS: RIGHT LOWER EXTREMITY Common Femoral Vein: No evidence of thrombus. Normal compressibility, respiratory phasicity and response to augmentation. Saphenofemoral Junction: No evidence of thrombus. Normal compressibility and flow on color Doppler imaging. Profunda Femoral Vein: No evidence of thrombus. Normal compressibility and flow on color Doppler imaging. Femoral Vein: No evidence of thrombus. Normal compressibility, respiratory phasicity and response to augmentation. Popliteal Vein: No evidence  of thrombus. Normal compressibility, respiratory phasicity and response to augmentation. Calf Veins: No evidence of thrombus. Normal compressibility and flow on color Doppler imaging. LEFT LOWER EXTREMITY Common Femoral Vein: No evidence of thrombus. Normal compressibility, respiratory phasicity and response to augmentation. Saphenofemoral Junction: No evidence of thrombus. Normal compressibility and flow on color Doppler imaging. Profunda Femoral Vein: No evidence of thrombus. Normal compressibility and flow on color Doppler imaging. Femoral Vein: No evidence of thrombus. Normal compressibility, respiratory phasicity and response to augmentation. Popliteal Vein: No evidence of thrombus. Normal compressibility, respiratory phasicity and response to augmentation. Calf Veins: No evidence of thrombus. Normal compressibility and flow on color Doppler imaging. IMPRESSION: No evidence  of deep venous thrombosis in either lower extremity. Electronically Signed   By: Feliberto Harts M.D.   On: 05/21/2023 10:42   DG Chest Port 1 View Result Date: 05/21/2023 CLINICAL DATA:  weakness EXAM: PORTABLE CHEST 1 VIEW COMPARISON:  Chest x-ray 05/03/2023 FINDINGS: The heart and mediastinal contours are unchanged. Right chest wall accessed Port-A-Cath with tip overlying the expected region the superior caval junction. Atherosclerotic plaque. No focal consolidation. No pulmonary edema. No pleural effusion. No pneumothorax. No acute osseous abnormality. IMPRESSION: 1. No active disease. 2.  Aortic Atherosclerosis (ICD10-I70.0). Electronically Signed   By: Tish Frederickson M.D.   On: 05/21/2023 01:59   CT ABDOMEN PELVIS W CONTRAST Result Date: 05/03/2023 CLINICAL DATA:  Peritonitis or perforation suspected. Abdominal pain. Nausea. Gastrostomy tube insertion. EXAM: CT ABDOMEN AND PELVIS WITH CONTRAST TECHNIQUE: Multidetector CT imaging of the abdomen and pelvis was performed using the standard protocol following bolus administration  of intravenous contrast. RADIATION DOSE REDUCTION: This exam was performed according to the departmental dose-optimization program which includes automated exposure control, adjustment of the mA and/or kV according to patient size and/or use of iterative reconstruction technique. CONTRAST:  OMNIPAQUE IOHEXOL 300 MG/ML  SOLN COMPARISON:  PET-CT 03/23/2023. FINDINGS: Lower chest: No acute abnormality. Hepatobiliary: No focal liver abnormality is seen. No gallstones, gallbladder wall thickening, or biliary dilatation. Pancreas: Unremarkable. No pancreatic ductal dilatation or surrounding inflammatory changes. Spleen: Normal in size without focal abnormality. Adrenals/Urinary Tract: Subcentimeter rounded hypodensities in the left kidney are too small to characterize, likely cysts. Otherwise, the adrenal glands, kidneys and bladder are within normal limits. Stomach/Bowel: There is a new percutaneous gastrostomy tube in the body of the stomach. There is mild stranding surrounding the stomach at the level of the gastrostomy tube without fluid collection or free air. The stomach otherwise appears within normal limits. Appendix is not seen. No evidence of other bowel wall thickening, distention, or inflammatory changes. Vascular/Lymphatic: Aortic atherosclerosis. No enlarged abdominal or pelvic lymph nodes. Reproductive: Prostate is unremarkable. Other: There is a small fat containing umbilical hernia. There is no free fluid or free air. There some subcutaneous and intramuscular edema/stranding at the level of the gastrostomy. No drainable fluid collections are seen. No soft tissue gas. Musculoskeletal: No acute or significant osseous findings. IMPRESSION: 1. New percutaneous gastrostomy tube in the body of the stomach. There is mild stranding surrounding the stomach at the level of the gastrostomy tube without fluid collection or free air. 2. Subcutaneous and intramuscular edema/stranding at the level of the  gastrostomy tube. No drainable fluid collections. 3. No other acute localizing process in the abdomen or pelvis. Aortic Atherosclerosis (ICD10-I70.0). Electronically Signed   By: Darliss Cheney M.D.   On: 05/03/2023 17:35   DG Chest 2 View Result Date: 05/03/2023 CLINICAL DATA:  Productive cough.  Laryngeal carcinoma. EXAM: CHEST - 2 VIEW COMPARISON:  None Available. FINDINGS: The heart size and mediastinal contours are within normal limits. Right-sided Port-A-Cath is seen in appropriate position. Both lungs are clear. The visualized skeletal structures are unremarkable. IMPRESSION: No active cardiopulmonary disease. Electronically Signed   By: Danae Orleans M.D.   On: 05/03/2023 15:36   IR PATIENT EVAL TECH 0-60 MINS Result Date: 05/03/2023 Barrett Shell, RT     05/03/2023  2:21 PM Patient presented to IR today with complaints of redness, tenderness, and swelling around newly placed gastrostomy tube site.  Gastrostomy tube bumper appeared to be tight to skin, likely due to swelling.  After bumper was loosened,  pus came out from around the tube site. At this time, S. Rae Roam, [place] consulted D. Deanne Coffer, MD, who advised patient to go to Emergency Department to be treated for infection.   IR GASTROSTOMY TUBE MOD SED Result Date: 04/28/2023 CLINICAL DATA:  Laryngeal carcinoma, needs gastrostomy for enteral feeding support EXAM: PERC PLACEMENT GASTROSTOMY FLUOROSCOPY: Radiation Exposure Index (as provided by the fluoroscopic device): 13 mGy air Kerma TECHNIQUE: The procedure, risks, benefits, and alternatives were explained to the patient. Questions regarding the procedure were encouraged and answered. The patient understands and consents to the procedure. As antibiotic prophylaxis, cefazolin 2 g was ordered pre-procedure and administered intravenously within one hour of incision. Progression of previously administered oral barium into the colon was confirmed fluoroscopically. A 5 French angiographic catheter was  placed as orogastric tube. The upper abdomen was prepped with Betadine, draped in usual sterile fashion, and infiltrated locally with 1% lidocaine. Intravenous Fentanyl and Versed 1mg  were administered by RN during a total moderate (conscious) sedation time of 10 minutes; the patient's level of consciousness and physiological / cardiorespiratory status were monitored continuously by radiology RN under my direct supervision. 0.5 mg glucagon given IV to facilitate gastric distention.Stomach was insufflated using air through the orogastric tube. An 37 French sheath needle was advanced percutaneously into the gastric lumen under fluoroscopy. Gas could be aspirated and a small contrast injection confirmed intraluminal spread. The sheath was exchanged over a guidewire for a 9 [place] vascular sheath, through which the snare device was advanced and used to snare a guidewire passed through the orogastric tube. This was withdrawn, and the snare attached to the 20 French pull-through gastrostomy tube, which was advanced antegrade, positioned with the internal bumper securing the anterior gastric wall to the anterior abdominal wall. Small contrast injection confirms appropriate positioning. The external bumper was applied and the catheter was flushed. COMPLICATIONS: COMPLICATIONS none IMPRESSION: 1. Technically successful 20 French pull-through gastrostomy placement under fluoroscopy. Electronically Signed   By: Corlis Leak M.D.   On: 04/28/2023 15:03   IR IMAGING GUIDED PORT INSERTION Result Date: 04/28/2023 CLINICAL DATA:  Laryngeal carcinoma, needs durable venous access for planned treatment regimen EXAM: TUNNELED PORT CATHETER PLACEMENT WITH ULTRASOUND AND FLUOROSCOPIC GUIDANCE FLUOROSCOPY: Radiation Exposure Index (as provided by the fluoroscopic device): Less than 0.1 mGy air Kerma ANESTHESIA/SEDATION: Intravenous Fentanyl and Versed 2mg  were administered by RN during a total moderate (conscious) sedation  time of 10 minutes; the patient's level of consciousness and physiological / cardiorespiratory status were monitored continuously by radiology RN under my direct supervision. TECHNIQUE: The procedure, risks, benefits, and alternatives were explained to the patient. Questions regarding the procedure were encouraged and answered. The patient understands and consents to the procedure. Patency of the right IJ vein was confirmed with ultrasound with image documentation. An appropriate skin site was determined. Skin site was marked. Region was prepped using maximum barrier technique including cap and mask, sterile gown, sterile gloves, large sterile sheet, and Chlorhexidine as cutaneous antisepsis. The region was infiltrated locally with 1% lidocaine. Under real-time ultrasound guidance, the right IJ vein was accessed with a 21 gauge micropuncture needle; the needle tip within the vein was confirmed with ultrasound image documentation. Needle was exchanged over a 018 guidewire for transitional dilator, and vascular measurement was performed. A small incision was made on the right anterior chest wall and a subcutaneous pocket fashioned. The power-injectable port was positioned and its catheter tunneled to the right IJ dermatotomy site. The transitional dilator was exchanged over  an Amplatz wire for a peel-away sheath, through which the port catheter, which had been trimmed to the appropriate length, was advanced and positioned under fluoroscopy with its tip at the cavoatrial junction. Spot chest radiograph confirms good catheter position and no pneumothorax. The port was flushed per protocol. The pocket was closed with deep interrupted and subcuticular continuous 3-0 Monocryl sutures. The incisions were covered with Dermabond then covered with a sterile dressing. The patient tolerated the procedure well. COMPLICATIONS: COMPLICATIONS None immediate IMPRESSION: Technically successful right IJ power-injectable port catheter  placement. Ready for routine use. Electronically Signed   By: Corlis Leak M.D.   On: 04/28/2023 15:01    Microbiology: Results for orders placed or performed during the hospital encounter of 05/21/23  Urine Culture     Status: None   Collection Time: 05/21/23  1:37 AM   Specimen: Urine, Clean Catch  Result Value Ref Range Status   Specimen Description   Final    URINE, CLEAN CATCH Performed at [facility], [address] [place], [place] [address]    Special Requests   Final    Normal Performed at [facility], [address] [place], [place] [address]    Culture   Final    NO GROWTH Performed at [facility], [address] [place], [place] [address]    Report Status 05/22/2023 FINAL  Final  Resp panel by RT-PCR (RSV, Flu A&B, Covid) Anterior Nasal Swab     Status: None   Collection Time: 05/21/23  1:48 AM   Specimen: Anterior Nasal Swab  Result Value Ref Range Status   SARS Coronavirus 2 by RT PCR NEGATIVE NEGATIVE Final    Comment: (NOTE) SARS-CoV-2 target nucleic acids are NOT DETECTED.  The SARS-CoV-2 RNA is generally detectable in upper respiratory specimens during the acute phase of infection. The lowest concentration of SARS-CoV-2 viral copies this assay can detect is 138 copies/mL. A negative result does not preclude SARS-Cov-2 infection and should not be used as the sole basis for treatment or other patient management decisions. A negative result may occur with  improper specimen collection/handling, submission of specimen other than nasopharyngeal swab, presence of viral mutation(s) within the areas targeted by this assay, and inadequate number of viral copies(<138 copies/mL). A negative result must be combined with clinical observations, patient history, and epidemiological information. The expected result is Negative.  Fact Sheet for Patients:  BloggerCourse.com  Fact Sheet for Healthcare Providers:   SeriousBroker.it  This test is no t yet approved or cleared by the [place] FDA and  has been authorized for detection and/or diagnosis of SARS-CoV-2 by FDA under an Emergency Use Authorization (EUA). This EUA will remain  in effect (meaning this test can be used) for the duration of the COVID-19 declaration under Section 564(b)(1) of the Act, 21 U.S.C.section 360bbb-3(b)(1), unless the authorization is terminated  or revoked sooner.       Influenza A by PCR NEGATIVE NEGATIVE Final   Influenza B by PCR NEGATIVE NEGATIVE Final    Comment: (NOTE) The Xpert Xpress SARS-CoV-2/FLU/RSV plus assay is intended as an aid in the diagnosis of influenza from Nasopharyngeal swab specimens and should not be used as a sole basis for treatment. Nasal washings and aspirates are unacceptable for Xpert Xpress SARS-CoV-2/FLU/RSV testing.  Fact Sheet for Patients: BloggerCourse.com  Fact Sheet for Healthcare Providers: SeriousBroker.it  This test is not yet approved or cleared by the [place] FDA and has been authorized for detection and/or diagnosis  of SARS-CoV-2 by FDA under an Emergency Use Authorization (EUA). This EUA will remain in effect (meaning this test can be used) for the duration of the COVID-19 declaration under Section 564(b)(1) of the Act, 21 U.S.C. section 360bbb-3(b)(1), unless the authorization is terminated or revoked.     Resp Syncytial Virus by PCR NEGATIVE NEGATIVE Final    Comment: (NOTE) Fact Sheet for Patients: BloggerCourse.com  Fact Sheet for Healthcare Providers: SeriousBroker.it  This test is not yet approved or cleared by the [place] FDA and has been authorized for detection and/or diagnosis of SARS-CoV-2 by FDA under an Emergency Use Authorization (EUA). This EUA will remain in effect (meaning this test can be used) for  the duration of the COVID-19 declaration under Section 564(b)(1) of the Act, 21 U.S.C. section 360bbb-3(b)(1), unless the authorization is terminated or revoked.  Performed at [facility], [address], [place], [place] [address]   Culture, blood (routine x 2)     Status: None   Collection Time: 05/21/23  2:07 AM   Specimen: BLOOD  Result Value Ref Range Status   Specimen Description BLOOD PORTA CATH  Final   Special Requests   Final    BOTTLES DRAWN AEROBIC AND ANAEROBIC Blood Culture adequate volume   Culture   Final    NO GROWTH 5 DAYS Performed at [facility], [address] [place], [place] [address]    Report Status 05/26/2023 FINAL  Final  Culture, blood (routine x 2)     Status: None   Collection Time: 05/21/23  2:07 AM   Specimen: BLOOD  Result Value Ref Range Status   Specimen Description BLOOD LEFT ANTECUBITAL  Final   Special Requests   Final    BOTTLES DRAWN AEROBIC AND ANAEROBIC Blood Culture adequate volume   Culture   Final    NO GROWTH 5 DAYS Performed at [facility], [address], [place], [place] [address]    Report Status 05/26/2023 FINAL  Final    Labs: CBC: Recent Labs  Lab 05/21/23 0148 05/22/23 0418 05/23/23 0537 05/24/23 0535 05/25/23 0433 05/26/23 0203 05/27/23 0416  WBC 3.2*   < > 9.9 6.2 4.3 3.7* 3.4*  NEUTROABS 2.0  --   --   --   --   --   --   HGB 12.3*   < > 9.9* 9.2* 8.8* 8.8* 9.1*  HCT 36.9*   < > 29.1* 27.2* 26.4* 27.0* 28.3*  MCV 89.6   < > 88.4 88.3 88.9 90.0 89.8  PLT 169   < > 97* 91* 92* 98* 113*   < > = values in this interval not displayed.   Basic Metabolic Panel: Recent Labs  Lab 05/21/23 0148 05/21/23 0824 05/22/23 0418 05/25/23 0433 05/26/23 0203  NA 137  --  135 134* 135  K 2.9*  --  3.7 4.1 4.3  CL 101  --  107 102 99  CO2 22  --  22 25 26   GLUCOSE 104*  --  108* 109* 125*  BUN 13  --  15 11 10   CREATININE 1.14  --  1.13 0.83 0.91  CALCIUM 8.9  --  8.1* 8.1* 8.2*  MG  --  2.2  --   --   --   PHOS   --   --   --   --  4.3   Liver Function Tests: Recent Labs  Lab 05/21/23 0148 05/22/23 0418 05/26/23 0203  AST 14* 14*  --   ALT  14 13  --   ALKPHOS 44 41  --   BILITOT 0.6 0.3  --   PROT 7.2 6.0*  --   ALBUMIN 3.4* 2.8* 2.5*   CBG: Recent Labs  Lab 05/21/23 0052 05/22/23 0740 05/22/23 1139  GLUCAP 113* 143* 123*    Discharge time spent: greater than 30 minutes.  Signed: Vassie Loll, MD [facility] 05/27/2023

## 2023-05-27 NOTE — Progress Notes (Addendum)
 PHARMACY - ANTICOAGULATION CONSULT NOTE  Pharmacy Consult for Heparin >> Eliquis Indication: DVT  No Known Allergies  Patient Measurements: Height: 5\' 4"  (162.6 cm) Weight: 63 kg (139 lb) IBW/kg (Calculated) : 59.2 HEPARIN DW (KG): 63   Vital Signs: Temp: 99.2 F (37.3 C) (02/27 0503) Temp Source: Oral (02/27 0503) BP: 135/69 (02/27 0503) Pulse Rate: 71 (02/27 0503)  Labs: Recent Labs    05/25/23 0433 05/25/23 1153 05/25/23 2014 05/26/23 0203 05/27/23 0416  HGB 8.8*  --   --  8.8* 9.1*  HCT 26.4*  --   --  27.0* 28.3*  PLT 92*  --   --  98* 113*  HEPARINUNFRC  --    < > 0.30 0.39 0.32  CREATININE 0.83  --   --  0.91  --    < > = values in this interval not displayed.    Estimated Creatinine Clearance: 77.7 mL/min (by C-G formula based on SCr of 0.91 mg/dL).   Medical History: Past Medical History:  Diagnosis Date   Anxiety    Bipolar 1 disorder (HCC)    EtOH dependence (HCC)    QUIT   GERD without esophagitis    Hepatitis C carrier (HCC)    Hypercholesteremia    Hypertension    laryngeal ca 02/2023   Polysubstance dependence (HCC)    COCAINE- quit   Stroke [facility])    has some ST memory deficits from stroke.   Substance induced mood disorder (HCC) 11/05/2016   Assessment: 53 year old male with past medical history of throat cancer currently undergoing chemotherapy and radiation. Patient brought by EMS after a syncopal episode, Patient had bilateral carotid duplex u/s and showed acute occlusive thrombus throughout the right internal jugular vein. Not on oral anticoagulants prior to admission. Pharmacy asked to transition from heparin to apixaban    Goal of Therapy:  Heparin level 0.3-0.7 units/ml Monitor platelets by anticoagulation protocol: Yes   Plan:  Stop heparin infusion Start apixaban 10 mg twice daily x 7 days followed by apixaban 5 mg twice daily. Monitor H&H and s/s  of bleeding  Judeth Cornfield, PharmD Clinical Pharmacist 05/27/2023 8:37  AM

## 2023-05-27 NOTE — Care Management Important Message (Signed)
 Important Message  Patient Details  Name: [name] MRN: [id number] Date of Birth: [birth year]   Important Message Given:  Yes - Medicare IM     Corey Harold 05/27/2023, 11:42 AM

## 2023-05-28 ENCOUNTER — Inpatient Hospital Stay: Payer: Medicare HMO | Admitting: Oncology

## 2023-05-28 ENCOUNTER — Ambulatory Visit
Admission: RE | Admit: 2023-05-28 | Discharge: 2023-05-28 | Disposition: A | Discharge status: Home / Self Care | Payer: Medicare HMO | Source: Ambulatory Visit | Attending: Radiation Oncology | Admitting: Radiation Oncology

## 2023-05-28 ENCOUNTER — Inpatient Hospital Stay: Payer: Medicare HMO

## 2023-05-28 ENCOUNTER — Telehealth: Payer: Self-pay

## 2023-05-28 ENCOUNTER — Encounter: Payer: Self-pay | Admitting: Oncology

## 2023-05-28 ENCOUNTER — Other Ambulatory Visit: Payer: Self-pay

## 2023-05-28 ENCOUNTER — Other Ambulatory Visit (HOSPITAL_COMMUNITY): Payer: Self-pay | Admitting: *Deleted

## 2023-05-28 VITALS — BP 105/82 | HR 61 | Temp 97.8°F | Resp 16 | Wt 139.8 lb

## 2023-05-28 DIAGNOSIS — C321 Malignant neoplasm of supraglottis: Secondary | ICD-10-CM | POA: Diagnosis not present

## 2023-05-28 DIAGNOSIS — K429 Umbilical hernia without obstruction or gangrene: Secondary | ICD-10-CM | POA: Diagnosis not present

## 2023-05-28 DIAGNOSIS — L03311 Cellulitis of abdominal wall: Secondary | ICD-10-CM | POA: Diagnosis not present

## 2023-05-28 DIAGNOSIS — G893 Neoplasm related pain (acute) (chronic): Secondary | ICD-10-CM | POA: Diagnosis not present

## 2023-05-28 DIAGNOSIS — Z95828 Presence of other vascular implants and grafts: Secondary | ICD-10-CM

## 2023-05-28 DIAGNOSIS — Z7952 Long term (current) use of systemic steroids: Secondary | ICD-10-CM | POA: Diagnosis not present

## 2023-05-28 DIAGNOSIS — I8290 Acute embolism and thrombosis of unspecified vein: Secondary | ICD-10-CM | POA: Diagnosis not present

## 2023-05-28 DIAGNOSIS — R1310 Dysphagia, unspecified: Secondary | ICD-10-CM

## 2023-05-28 DIAGNOSIS — Z9221 Personal history of antineoplastic chemotherapy: Secondary | ICD-10-CM | POA: Diagnosis not present

## 2023-05-28 DIAGNOSIS — Z923 Personal history of irradiation: Secondary | ICD-10-CM | POA: Diagnosis not present

## 2023-05-28 DIAGNOSIS — I700 Atherosclerosis of aorta: Secondary | ICD-10-CM | POA: Diagnosis not present

## 2023-05-28 DIAGNOSIS — D500 Iron deficiency anemia secondary to blood loss (chronic): Secondary | ICD-10-CM

## 2023-05-28 DIAGNOSIS — Z79899 Other long term (current) drug therapy: Secondary | ICD-10-CM | POA: Diagnosis not present

## 2023-05-28 DIAGNOSIS — Z5111 Encounter for antineoplastic chemotherapy: Secondary | ICD-10-CM | POA: Diagnosis not present

## 2023-05-28 DIAGNOSIS — R110 Nausea: Secondary | ICD-10-CM | POA: Diagnosis not present

## 2023-05-28 DIAGNOSIS — Z510 Encounter for antineoplastic radiation therapy: Secondary | ICD-10-CM | POA: Diagnosis not present

## 2023-05-28 LAB — BASIC METABOLIC PANEL - CANCER CENTER ONLY
Anion gap: 7 (ref 5–15)
BUN: 17 mg/dL (ref 6–20)
CO2: 30 mmol/L (ref 22–32)
Calcium: 9.7 mg/dL (ref 8.9–10.3)
Chloride: 99 mmol/L (ref 98–111)
Creatinine: 1.03 mg/dL (ref 0.61–1.24)
GFR, Estimated: >60 mL/min (ref >60)
Glucose, Bld: 125 mg/dL — ABNORMAL HIGH (ref 70–99)
Potassium: 5.1 mmol/L (ref 3.5–5.1)
Sodium: 136 mmol/L (ref 135–145)

## 2023-05-28 LAB — CBC WITH DIFFERENTIAL (CANCER CENTER ONLY)
Abs Immature Granulocytes: 0.05 10*3/uL (ref 0.00–0.07)
Basophils Absolute: 0 10*3/uL (ref 0.0–0.1)
Basophils Relative: 0 %
Eosinophils Absolute: 0 10*3/uL (ref 0.0–0.5)
Eosinophils Relative: 0 %
HCT: 30.6 % — ABNORMAL LOW (ref 39.0–52.0)
Hemoglobin: 10.3 g/dL — ABNORMAL LOW (ref 13.0–17.0)
Immature Granulocytes: 2 %
Lymphocytes Relative: 10 %
Lymphs Abs: 0.3 10*3/uL — ABNORMAL LOW (ref 0.7–4.0)
MCH: 29.4 pg (ref 26.0–34.0)
MCHC: 33.7 g/dL (ref 30.0–36.0)
MCV: 87.4 fL (ref 80.0–100.0)
Monocytes Absolute: 0.1 10*3/uL (ref 0.1–1.0)
Monocytes Relative: 5 %
Neutro Abs: 2.3 10*3/uL (ref 1.7–7.7)
Neutrophils Relative %: 83 %
Platelet Count: 167 10*3/uL (ref 150–400)
RBC: 3.5 MIL/uL — ABNORMAL LOW (ref 4.22–5.81)
RDW: 13.2 % (ref 11.5–15.5)
WBC Count: 2.8 10*3/uL — ABNORMAL LOW (ref 4.0–10.5)
nRBC: 0 % (ref 0.0–0.2)

## 2023-05-28 LAB — RAD ONC ARIA SESSION SUMMARY
Course Elapsed Days: 44
Plan Fractions Treated to Date: 28
Plan Prescribed Dose Per Fraction: 2 Gy
Plan Total Fractions Prescribed: 35
Plan Total Prescribed Dose: 70 Gy
Reference Point Dosage Given to Date: 56 Gy
Reference Point Session Dosage Given: 2 Gy
Session Number: 28

## 2023-05-28 LAB — MAGNESIUM: Magnesium: 2.1 mg/dL (ref 1.7–2.4)

## 2023-05-28 MED ORDER — SODIUM CHLORIDE 0.9% FLUSH
10.0000 mL | Freq: Once | INTRAVENOUS | Status: AC
  Administered 2023-05-28: 10 mL

## 2023-05-28 MED ORDER — OXYCODONE HCL 10 MG PO TABS
10.0000 mg | ORAL_TABLET | Freq: Four times a day (QID) | ORAL | 0 refills | Status: DC | PRN
Start: 2023-05-28 — End: 2023-12-14

## 2023-05-28 MED ORDER — HEPARIN SOD (PORK) LOCK FLUSH 100 UNIT/ML IV SOLN
500.0000 [IU] | Freq: Once | INTRAVENOUS | Status: AC
  Administered 2023-05-28: 500 [IU]

## 2023-05-28 MED FILL — Fosaprepitant Dimeglumine For IV Infusion 150 MG (Base Eq): INTRAVENOUS | Qty: 5 | Status: AC

## 2023-05-28 NOTE — Assessment & Plan Note (Addendum)
 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   Discussed his case in our tumor conference on 04/14/2023.  Based on imaging studies, it is classified as cT3 lesion.  cN1.  Hence plan is to proceed with concurrent chemoradiation.  We have discussed about role of cisplatin being a radiosensitizer in the treatment of head and neck cancer.  We have discussed about the curative intent of chemoradiation for this patient.     -He began radiation treatments from 04/14/2023.  Began chemotherapy with cisplatin from 04/19/2023.  He has been tolerating chemoradiation well without any noticeable side effects.  Recently he was hospitalized from 05/03/2023 until 05/07/2023 for cellulitis around the site of he G-tube insertion.  He was treated with IV antibiotics initially and later completed course of oral antibiotics.  We held chemotherapy that week.  He was hospitalized again from 05/21/2023 until 05/27/2023 at [facility] for pneumonia and sepsis.  Currently on antibiotics again.  We held chemotherapy this week and he also missed his radiation treatments.  Labs today reveal leukopenia with white count of 2800 but ANC is 2300.  He is scheduled for cycle 6 of chemotherapy on 05/31/2023.  We will dose reduce cisplatin to 30 mg/m dose.  He will also be given growth factor support with Zarxio for 2 days next week.  Otherwise no dose-limiting toxicities.  -He has prescriptions available for nausea medicines.  Emphasized adequate hydration.  Patient verbalized understanding.  Will continue to monitor blood counts with each treatment.    RTC in 1 week for labs, follow-up and continuation of chemotherapy.

## 2023-05-28 NOTE — Transitions of Care (Post Inpatient/ED Visit) (Signed)
 05/28/2023  Name: NYZIER BOIVIN MRN: [id number] DOB: [birth year]  Today's TOC FU Call Status: Today's TOC FU Call Status:: Successful TOC FU Call Completed TOC FU Call Complete Date: 05/28/23 Patient's Name and Date of Birth confirmed.  Transition Care Management Follow-up Telephone Call Date of Discharge: 05/27/23 Discharge [facility] Type of Discharge: Inpatient Admission Primary Inpatient Discharge Diagnosis:: Internal jugular (IJ) vein thromboembolism, acute, right (/Acute DVT of Right IJV- acute occlusive thrombus throughout the right internal jugular vein. How have you been since you were released from the hospital?: Better Any questions or concerns?: No  Items Reviewed: Did you receive and understand the discharge instructions provided?: Yes Medications obtained,verified, and reconciled?: Yes (Medications Reviewed) Any new allergies since your discharge?: No Dietary orders reviewed?: NA Do you have support at home?: Yes People in Home: significant other  Medications Reviewed Today: Medications Reviewed Today     Reviewed by Leigh Aurora, CMA (Certified Medical Assistant) on 05/28/23 at [phone]  Med List Status: <None>   Medication Order Taking? Sig Documenting Provider Last Dose Status Informant  acetaminophen (TYLENOL) 500 MG tablet [id number] No Take 1,000 mg by mouth every 6 (six) hours as needed for mild pain (pain score 1-3) or moderate pain (pain score 4-6). [provider] 05/20/2023 Active Self, Pharmacy Records, Spouse/Significant Other  amoxicillin-clavulanate (AUGMENTIN) 600-42.9 MG/5ML suspension [id number]  Take 7.3 mLs (875 mg total) by mouth 2 (two) times daily for 3 days. Vassie Loll, MD  Active   apixaban (ELIQUIS) 5 MG TABS tablet [id number]  Take 2 tablets (10 mg total) by mouth 2 (two) times daily for 7 days, THEN 1 tablet (5 mg total) 2 (two) times daily. Vassie Loll, MD  Active   dexamethasone (DECADRON) 4 MG tablet  [id number] No Take 2 tablets (8 mg) by mouth daily x 3 days starting the day after cisplatin chemotherapy. Take with food. Meryl Crutch, MD 05/20/2023 Active Self, Pharmacy Records, Spouse/Significant Other           Med Note Antony Madura, Ladona Mow May 03, 2023  8:40 PM)    famotidine (PEPCID) 40 MG tablet [id number]  Take 1 tablet (40 mg total) by mouth at bedtime. Vassie Loll, MD  Active   FLUoxetine (PROZAC) 20 MG capsule [id number] No Take 1 capsule (20 mg total) by mouth daily. [name], MD 05/20/2023 Active Self, Pharmacy Records, Spouse/Significant Other           Med Note Jola Schmidt   Fri May 21, 2023 10:15 AM) Dispense records do not support compliance. Had bottle at bedside with medication inside bottle.  guaiFENesin-dextromethorphan (ROBITUSSIN DM) 100-10 MG/5ML syrup [id number]  Place 15 mLs into feeding tube every 6 (six) hours. Vassie Loll, MD  Active   hydrOXYzine (ATARAX) 25 MG tablet [id number] No Take 1 tablet (25 mg total) by mouth 3 (three) times daily as needed for anxiety.  Patient taking differently: Take 25 mg by mouth at bedtime.   [name], MD 05/20/2023 Active Self, Pharmacy Records, Spouse/Significant Other  lidocaine (XYLOCAINE) 2 % solution [id number] No Patient: Mix 1part 2% viscous lidocaine, 1part H20. Swallow 10mL of diluted mixture, before meals and at bedtime, up to QID Lonie Peak, MD Past Week Active Self, Pharmacy Records, Spouse/Significant Other           Med Note Jola Schmidt   Fri May 21, 2023 10:13 AM)    lidocaine-prilocaine (EMLA) cream [id number] No Apply to affected area  once Pasam, Archie Patten, MD Taking Active Self, Pharmacy Records, Spouse/Significant Other           Med Note Antony Madura, Ladona Mow May 03, 2023  8:40 PM)    metoprolol tartrate (LOPRESSOR) 25 MG tablet [id number]  Take 0.5 tablets (12.5 mg total) by mouth 2 (two) times daily. Vassie Loll, MD  Active   Nutritional Supplements (FEEDING SUPPLEMENT, OSMOLITE  1.5 CAL,) LIQD [id number] No Place 60 mL/hr into feeding tube continuous. 7 Cartons of Osmolite 1.5 per day + an additional free water from flushes For long-term  Patient taking differently: Place 60 mL/hr into feeding tube continuous. 4 Cartons of Osmolite 1.5 per day + an additional free water from flushes For long-term   Tobey Grim, MD 05/20/2023 Active Self, Spouse/Significant Other, Pharmacy Records  ondansetron (ZOFRAN) 8 MG tablet [id number] No Take 1 tablet (8 mg total) by mouth every 8 (eight) hours as needed for nausea or vomiting. Start on the third day after cisplatin. Meryl Crutch, MD Taking Active Self, Pharmacy Records, Spouse/Significant Other           Med Note Jola Schmidt   Fri May 21, 2023 10:16 AM) Has if needed. Has not had to take.   oxyCODONE (OXY IR/ROXICODONE) 5 MG immediate release tablet [id number]  Take 1 tablet (5 mg total) by mouth every 4 (four) hours as needed for severe pain (pain score 7-10). Vassie Loll, MD  Active   pantoprazole (PROTONIX) 40 MG tablet [id number]  Take 1 tablet (40 mg total) by mouth 2 (two) times daily. Vassie Loll, MD  Active   prochlorperazine (COMPAZINE) 10 MG tablet [id number] No TAKE 1 TABLET(10 MG) BY MOUTH EVERY 6 HOURS AS NEEDED FOR NAUSEA OR VOMITING Pasam, Avinash, MD Taking Active Self, Pharmacy Records, Spouse/Significant Other           Med Note Jola Schmidt   Fri May 21, 2023 10:17 AM) Has if needed. Has not had to take.   Protein (FEEDING SUPPLEMENT, PROSOURCE TF20,) liquid [id number] No Place 60 mLs into feeding tube daily.  Patient not taking: Reported on 05/21/2023   Tobey Grim, MD Not Taking Active Self, Spouse/Significant Other, Pharmacy Records  QUEtiapine (SEROQUEL) 50 MG tablet [id number] No Take one pill nightly for 1 week. Then increase to 2 pills nightly for 1 week. Then increase to 3 pills nightly for one week. Then increase to 4 pills nightly for one week.  Patient taking  differently: Take 50 mg by mouth at bedtime. Take one pill nightly for 1 week. Then increase to 2 pills nightly for 1 week. Then increase to 3 pills nightly for one week. Then increase to 4 pills nightly for one week.   Elsie Lincoln, MD 05/20/2023 Active Self, Pharmacy Records, Spouse/Significant Other           Med Note Jola Schmidt   Fri May 21, 2023 10:17 AM) Confirmed taking 50mg  at bedtime. Does not plan on tapering up as of right now.   sucralfate (CARAFATE) 1 GM/10ML suspension [id number]  Place 10 mLs (1 g total) into feeding tube in the morning, at noon, and at bedtime for 20 days. Vassie Loll, MD  Active             Home Care and Equipment/Supplies: Were Home Health Services Ordered?: Yes Name of Home Health Agency:: [facility] Has Agency set up a time to come to your home?:  (wife is contatcing [facility]  today) Any new equipment or medical supplies ordered?: NA  Functional Questionnaire: Do you need assistance with bathing/showering or dressing?: No Do you need assistance with meal preparation?: No Do you need assistance with eating?: No Do you have difficulty maintaining continence: No Do you need assistance with getting out of bed/getting out of a chair/moving?: No Do you have difficulty managing or taking your medications?: No  Follow up appointments reviewed: PCP Follow-up appointment confirmed?: No (pt declined- states he will have wife call back to today to schedule) Specialist Hospital Follow-up appointment confirmed?: Yes Date of Specialist follow-up appointment?: 05/28/23 Follow-Up Specialty Provider:: Pasam, Archie Patten, MD Do you need transportation to your follow-up appointment?: No Do you understand care options if your condition(s) worsen?: Yes-patient verbalized understanding    SIGNATURE  Agnes Lawrence, CMA (AAMA)  [facility] [phone]

## 2023-05-28 NOTE — Progress Notes (Signed)
 [facility]  ONCOLOGY CLINIC PROGRESS NOTE   Patient Care Team: [name], MD as PCP - General (Internal Medicine) West Bali, MD (Inactive) as Consulting Physician (Gastroenterology) Lonie Peak, MD as Attending Physician (Radiation Oncology) Malmfelt, Lise Auer, RN as Oncology Nurse Navigator Meryl Crutch, MD as Consulting Physician (Oncology) Karle Barr, MD as Referring Physician (Otolaryngology)  PATIENT NAME: [name]   MR#: [id number] DOB: Mar 05, 1971  Date of visit: 05/28/2023   ASSESSMENT & PLAN:   [name] is a 53 y.o. 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   Malignant neoplasm of supraglottis (HCC) Supraglottic cancer with one suspicious 7 mm lymph node on PET scan, classified as T3, N1, M0, stage III, low volume disease.   Discussed his case in our tumor conference on 04/14/2023.  Based on imaging studies, it is classified as cT3 lesion.  cN1.  Hence plan is to proceed with concurrent chemoradiation.  We have discussed about role of cisplatin being a radiosensitizer in the treatment of head and neck cancer.  We have discussed about the curative intent of chemoradiation for this patient.     -He began radiation treatments from 04/14/2023.  Began chemotherapy with cisplatin from 04/19/2023.  He has been tolerating chemoradiation well without any noticeable side effects.  Recently he was hospitalized from 05/03/2023 until 05/07/2023 for cellulitis around the site of he G-tube insertion.  He was treated with IV antibiotics initially and later completed course of oral antibiotics.  We held chemotherapy that week.  He was hospitalized again from 05/21/2023 until 05/27/2023 at [facility] for pneumonia and sepsis.  Currently on antibiotics again.  We held chemotherapy  this week and he also missed his radiation treatments.  Labs today reveal leukopenia with white count of 2800 but ANC is 2300.  He is scheduled for cycle 6 of chemotherapy on 05/31/2023.  We will dose reduce cisplatin to 30 mg/m dose.  He will also be given growth factor support with Zarxio for 2 days next week.  Otherwise no dose-limiting toxicities.  -He has prescriptions available for nausea medicines.  Emphasized adequate hydration.  Patient verbalized understanding.  Will continue to monitor blood counts with each treatment.    RTC in 1 week for labs, follow-up and continuation of chemotherapy.   Acute DVT of Right IJV- acute occlusive thrombus throughout the right internal jugular vein. Found during recent hospitalization.  He is currently on Eliquis 5 mg p.o. twice daily.  Plan to continue anticoagulation at least for 3 months followed by reevaluation.    I reviewed lab results and outside records for this visit and discussed relevant results with the patient. Diagnosis, plan of care and treatment options were also discussed in detail with the patient. Opportunity provided to ask questions and answers provided to his apparent satisfaction. Provided instructions to call our clinic with any problems, questions or concerns prior to return visit. I recommended to continue follow-up with PCP and sub-specialists. He verbalized understanding and agreed with the plan.   NCCN guidelines have been consulted in the planning of this patient's care.  I spent a total of 30 minutes during this encounter with the patient including review of chart and various tests results, discussions about plan of care and coordination of care plan.   Meryl Crutch, MD  05/28/2023 4:33  PM  [facility] 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   Current Treatment: Concurrent chemoradiation with weekly cisplatin.  Radiation started from 04/14/2023.  Cisplatin started from 04/19/2023.  INTERVAL HISTORY:    Discussed the use of AI scribe software for clinical note transcription with the patient, who gave verbal consent to proceed.   [name] is here today for repeat clinical assessment.   He was recently hospitalized at [facility] for pneumonia and sepsis and got discharged yesterday. He was found unconscious on the floor by family members and does not recall the event. He lives with his spouse and children, who were not home at the time. He was quite ill during his hospital stay, which necessitated a pause in his chemotherapy treatment. He also developed a clot in the neck area, for which he is now on Eliquis. His blood counts have decreased, even without receiving chemotherapy this week. He has been experiencing night sweats but denies fevers and chills. He is currently on antibiotics, which he is scheduled to complete next week. He also reports pain, especially at night, which is managed with oxycodone administered through a tube. He has requested stronger pain medication.  I have reviewed the past medical history, past surgical history, social history and family history with the patient and they are unchanged from previous note.  HISTORY OF PRESENT ILLNESS:   ONCOLOGY HISTORY:   He initially presented to the [facility] on 10/28/22 with c/o left ear pain, popping with chewing, and pain radiating down the side of his neck and throat x 1 month. He was treated for a left ear infection with a course of prednisone.   His symptoms including left ear and throat pain however persisted and progressed and he was subsequently referred to Dr. Suszanne Conners on 03/10/23 at [facility] for further evaluation.  During which time, the patient detailed that his pain first began in the ear and eventually progressed to involve the left side of his throat.  A laryngoscopy was subsequently performed at that time which revealed an ulcerative lesion on the posterior aspect of the epiglottis, superior to the vocal cords. The arytenoid mucosa was also mildly edematous and the true vocal folds were pale yellow, mobile, and without mass or lesion.       He accordingly underwent a direct laryngoscopy with biopsies of the left supraglottic tumor/mass on 03/16/23. Pathology showed findings consistent with invasive and in situ moderately differentiated squamous cell carcinoma. Per note by Dr Suszanne Conners, "Examination of the vallecula, piriform sinuses, and the pharyngeal mucosa were all normal. A fungating mass was noted on the posterior aspect of the left epiglottis. The mass did not appear to involve the vocal cords."   Soft tissue neck CT and PET scan on 03/23/23 were done - demonstrating a left supraglottic lesion which may involve the glottis as well as a 7 mm left level 3 lymph node that is suspicious for metastatic involvement.  No distant metastatic disease.  cT3,cN1,cM0, Stage III disease.   Plan made to proceed with concurrent chemoradiation with weekly cisplatin.  Oncology History  Malignant neoplasm of supraglottis (HCC)  03/22/2023 Initial Diagnosis   Malignant neoplasm of supraglottis (HCC)  03/26/2023 Cancer Staging   Staging form: Larynx - Supraglottis, AJCC 8th Edition - Clinical stage from 03/26/2023: Stage III (cT3, cN1, cM0) - Signed by Lonie Peak, MD on 04/14/2023 Stage prefix: Initial diagnosis   04/19/2023 -  Chemotherapy   Patient is on Treatment Plan : HEAD/NECK Cisplatin (40) q7d         REVIEW OF SYSTEMS:   Review of Systems - Oncology  All other pertinent systems were reviewed with the patient and are negative.  ALLERGIES: He has no known allergies.  MEDICATIONS:  Current  Outpatient Medications  Medication Sig Dispense Refill   acetaminophen (TYLENOL) 500 MG tablet Take 1,000 mg by mouth every 6 (six) hours as needed for mild pain (pain score 1-3) or moderate pain (pain score 4-6).     amoxicillin-clavulanate (AUGMENTIN) 600-42.9 MG/5ML suspension Take 7.3 mLs (875 mg total) by mouth 2 (two) times daily for 3 days. 43.8 mL 0   apixaban (ELIQUIS) 5 MG TABS tablet Take 2 tablets (10 mg total) by mouth 2 (two) times daily for 7 days, THEN 1 tablet (5 mg total) 2 (two) times daily. 388 tablet 0   dexamethasone (DECADRON) 4 MG tablet Take 2 tablets (8 mg) by mouth daily x 3 days starting the day after cisplatin chemotherapy. Take with food. 30 tablet 1   famotidine (PEPCID) 40 MG tablet Take 1 tablet (40 mg total) by mouth at bedtime.     FLUoxetine (PROZAC) 20 MG capsule Take 1 capsule (20 mg total) by mouth daily. 30 capsule 3   guaiFENesin-dextromethorphan (ROBITUSSIN DM) 100-10 MG/5ML syrup Place 15 mLs into feeding tube every 6 (six) hours. 118 mL 0   hydrOXYzine (ATARAX) 25 MG tablet Take 1 tablet (25 mg total) by mouth 3 (three) times daily as needed for anxiety. (Patient taking differently: Take 25 mg by mouth at bedtime.) 90 tablet 1   lidocaine (XYLOCAINE) 2 % solution Patient: Mix 1part 2% viscous lidocaine, 1part H20. Swallow 10mL of diluted mixture, before meals and at bedtime, up to QID 200 mL 3   lidocaine-prilocaine (EMLA) cream Apply to affected area once 30 g 3   metoprolol tartrate (LOPRESSOR) 25 MG tablet Take 0.5 tablets (12.5 mg total) by mouth 2 (two) times daily. 60 tablet 1   Nutritional Supplements (FEEDING SUPPLEMENT, OSMOLITE 1.5 CAL,) LIQD Place 60 mL/hr into feeding tube continuous. 7 Cartons of Osmolite 1.5 per day + an additional free water from flushes For long-term (Patient taking differently: Place 60 mL/hr into feeding tube continuous. 4 Cartons of Osmolite 1.5 per day + an additional free water from flushes For  long-term) 1000 mL 0   ondansetron (ZOFRAN) 8 MG tablet Take 1 tablet (8 mg total) by mouth every 8 (eight) hours as needed for nausea or vomiting. Start on the third day after cisplatin. 30 tablet 1   pantoprazole (PROTONIX) 40 MG tablet Take 1 tablet (40 mg total) by mouth 2 (two) times daily. 60 tablet 2   prochlorperazine (COMPAZINE) 10 MG tablet TAKE 1 TABLET(10 MG) BY MOUTH EVERY 6 HOURS AS NEEDED FOR NAUSEA OR VOMITING 30 tablet 1   Protein (FEEDING SUPPLEMENT, PROSOURCE TF20,) liquid Place 60 mLs into feeding tube daily. 1800 mL 0   QUEtiapine (SEROQUEL) 50 MG tablet Take one pill nightly for 1 week. Then increase to 2 pills nightly for 1 week. Then increase to 3 pills nightly for one week. Then increase to 4 pills nightly for one week. (Patient taking differently: Take 50  mg by mouth at bedtime. Take one pill nightly for 1 week. Then increase to 2 pills nightly for 1 week. Then increase to 3 pills nightly for one week. Then increase to 4 pills nightly for one week.) 90 tablet 0   sucralfate (CARAFATE) 1 GM/10ML suspension Place 10 mLs (1 g total) into feeding tube in the morning, at noon, and at bedtime for 20 days. 600 mL 0   oxyCODONE 10 MG TABS Take 1 tablet (10 mg total) by mouth every 6 (six) hours as needed for severe pain (pain score 7-10). 60 tablet 0   No current facility-administered medications for this visit.     VITALS:   Blood pressure 105/82, pulse 61, temperature 97.8 F (36.6 C), temperature source Temporal, resp. rate 16, weight 139 lb 12.8 oz (63.4 kg), SpO2 97%.  Wt Readings from Last 3 Encounters:  05/28/23 139 lb 12.8 oz (63.4 kg)  05/21/23 139 lb (63 kg)  05/17/23 146 lb (66.2 kg)    Body mass index is 24 kg/m.   Onc Performance Status - 05/28/23 1455       ECOG Perf Status   ECOG Perf Status Fully active, able to carry on all pre-disease performance without restriction      KPS SCALE   KPS % SCORE Normal activity with effort, some s/s of disease               PHYSICAL EXAM:   Physical Exam Constitutional:      General: He is not in acute distress.    Appearance: Normal appearance.  HENT:     Head: Normocephalic and atraumatic.     Mouth/Throat:     Comments: Mild erythema in the back of the throat. Eyes:     General: No scleral icterus.    Conjunctiva/sclera: Conjunctivae normal.  Neck:     Comments: Skin desquamation on the anterior aspect Cardiovascular:     Rate and Rhythm: Normal rate and regular rhythm.     Heart sounds: Normal heart sounds.  Pulmonary:     Effort: Pulmonary effort is normal.     Breath sounds: Normal breath sounds.  Chest:     Comments: Port-A-Cath in place without any signs of infection Abdominal:     General: There is no distension.     Comments: Feeding tube in place, bandaged.  Cellulitis has resolved.  Musculoskeletal:     Right lower leg: No edema.     Left lower leg: No edema.  Neurological:     General: No focal deficit present.     Mental Status: He is alert and oriented to person, place, and time.  Psychiatric:        Mood and Affect: Mood normal.        Behavior: Behavior normal.        Thought Content: Thought content normal.       LABORATORY DATA:   I have reviewed the data as listed.  Results for orders placed or performed in visit on 05/28/23  Rad Onc Aria Session Summary  Result Value Ref Range   Course ID [id number]    Course Start Date 04/05/2023    Session Number 28    Course First Treatment Date 04/14/2023  9:27 AM    Course Last Treatment Date 05/28/2023  2:13 PM    Course Elapsed Days 44    Reference Point ID HN    Reference Point Dosage Given to Date 55.9999999999999 Gy   Reference Point Session Dosage  Given 2 Gy   Plan ID HN    Plan Fractions Treated to Date 28    Plan Total Fractions Prescribed 35    Plan Prescribed Dose Per Fraction 2 Gy   Plan Total Prescribed Dose 70.000000 Gy   Plan Primary Reference Point HN   Results for orders placed or performed in  visit on 05/28/23  Magnesium  Result Value Ref Range   Magnesium 2.1 1.7 - 2.4 mg/dL  Basic Metabolic Panel - Cancer Center Only  Result Value Ref Range   Sodium 136 135 - 145 mmol/L   Potassium 5.1 3.5 - 5.1 mmol/L   Chloride 99 98 - 111 mmol/L   CO2 30 22 - 32 mmol/L   Glucose, Bld 125 (H) 70 - 99 mg/dL   BUN 17 6 - 20 mg/dL   Creatinine 0.45 4.09 - 1.24 mg/dL   Calcium 9.7 8.9 - 81.1 mg/dL   GFR, Estimated >91 >47 mL/min   Anion gap 7 5 - 15  CBC with Differential (Cancer Center Only)  Result Value Ref Range   WBC Count 2.8 (L) 4.0 - 10.5 K/uL   RBC 3.50 (L) 4.22 - 5.81 MIL/uL   Hemoglobin 10.3 (L) 13.0 - 17.0 g/dL   HCT 82.9 (L) 56.2 - 13.0 %   MCV 87.4 80.0 - 100.0 fL   MCH 29.4 26.0 - 34.0 pg   MCHC 33.7 30.0 - 36.0 g/dL   RDW 86.5 78.4 - 69.6 %   Platelet Count 167 150 - 400 K/uL   nRBC 0.0 0.0 - 0.2 %   Neutrophils Relative % 83 %   Neutro Abs 2.3 1.7 - 7.7 K/uL   Lymphocytes Relative 10 %   Lymphs Abs 0.3 (L) 0.7 - 4.0 K/uL   Monocytes Relative 5 %   Monocytes Absolute 0.1 0.1 - 1.0 K/uL   Eosinophils Relative 0 %   Eosinophils Absolute 0.0 0.0 - 0.5 K/uL   Basophils Relative 0 %   Basophils Absolute 0.0 0.0 - 0.1 K/uL   Immature Granulocytes 2 %   Abs Immature Granulocytes 0.05 0.00 - 0.07 K/uL     RADIOGRAPHIC STUDIES:  I have personally reviewed the radiological images as listed and agree with the findings in the report.  ECHOCARDIOGRAM COMPLETE Result Date: 05/22/2023    ECHOCARDIOGRAM REPORT   Patient Name:   ORLIN KANN Date of Exam: 05/22/2023 Medical Rec #:  [id number]      Height:       64.0 in Accession #:    [id number]     Weight:       139.0 lb Date of Birth:  09-28-1970     BSA:          1.676 m Patient Age:    52 years       BP:           136/85 mmHg Patient Gender: M              HR:           72 bpm. [facility] Procedure: 2D Echo, 3D Echo, Cardiac Doppler, Color Doppler and Strain Analysis            (Both Spectral and Color  Flow Doppler were utilized during            procedure). Indications:    Syncope  History:        Patient has prior history of Echocardiogram examinations, most  recent 12/24/2016. Risk Factors:Hypertension and Former Smoker.  Sonographer:    Karma Ganja Referring Phys: [id number] COURAGE EMOKPAE  Sonographer Comments: Global longitudinal strain was attempted. IMPRESSIONS  1. Left ventricular ejection fraction, by estimation, is 60 to 65%. The left ventricle has normal function. The left ventricle has no regional wall motion abnormalities. Left ventricular diastolic parameters were normal. The average left ventricular global longitudinal strain is -18.2 %. The global longitudinal strain is normal.  2. Right ventricular systolic function is normal. The right ventricular size is normal.  3. The mitral valve is normal in structure. No evidence of mitral valve regurgitation. No evidence of mitral stenosis.  4. The aortic valve is normal in structure. Aortic valve regurgitation is not visualized. No aortic stenosis is present.  5. The inferior vena cava is normal in size with greater than 50% respiratory variability, suggesting right atrial pressure of 3 mmHg. FINDINGS  Left Ventricle: Left ventricular ejection fraction, by estimation, is 60 to 65%. The left ventricle has normal function. The left ventricle has no regional wall motion abnormalities. The average left ventricular global longitudinal strain is -18.2 %. Strain was performed and the global longitudinal strain is normal. 3D ejection fraction reviewed and evaluated as part of the interpretation. Alternate measurement of EF is felt to be most reflective of LV function. The left ventricular internal cavity size was normal in size. There is no left ventricular hypertrophy. Left ventricular diastolic parameters were normal. Right Ventricle: The right ventricular size is normal. No increase in right ventricular wall thickness. Right ventricular systolic  function is normal. Left Atrium: Left atrial size was normal in size. Right Atrium: Right atrial size was normal in size. Pericardium: There is no evidence of pericardial effusion. Mitral Valve: The mitral valve is normal in structure. No evidence of mitral valve regurgitation. No evidence of mitral valve stenosis. Tricuspid Valve: The tricuspid valve is normal in structure. Tricuspid valve regurgitation is not demonstrated. No evidence of tricuspid stenosis. Aortic Valve: The aortic valve is normal in structure. Aortic valve regurgitation is not visualized. No aortic stenosis is present. Aortic valve mean gradient measures 6.0 mmHg. Aortic valve peak gradient measures 11.0 mmHg. Aortic valve area, by VTI measures 2.14 cm. Pulmonic Valve: The pulmonic valve was not well visualized. Aorta: The aortic root is normal in size and structure. Venous: The inferior vena cava is normal in size with greater than 50% respiratory variability, suggesting right atrial pressure of 3 mmHg. IAS/Shunts: No atrial level shunt detected by color flow Doppler. Additional Comments: 3D was performed not requiring image post processing on an independent workstation and was indeterminate.  LEFT VENTRICLE PLAX 2D LVIDd:         4.30 cm   Diastology LVIDs:         2.90 cm   LV e' medial:    9.79 cm/s LV PW:         0.80 cm   LV E/e' medial:  9.0 LV IVS:        0.90 cm   LV e' lateral:   15.80 cm/s LVOT diam:     1.90 cm   LV E/e' lateral: 5.6 LV SV:         62 LV SV Index:   37        2D Longitudinal Strain LVOT Area:     2.84 cm  2D Strain GLS Avg:     -18.2 %  3D Volume EF:                          3D EF:        52 %                          LV EDV:       182 ml                          LV ESV:       87 ml                          LV SV:        95 ml RIGHT VENTRICLE RV Basal diam:  4.10 cm RV S prime:     17.60 cm/s TAPSE (M-mode): 2.6 cm LEFT ATRIUM             Index        RIGHT ATRIUM           Index LA diam:         2.90 cm 1.73 cm/m   RA Area:     10.20 cm LA Vol (A2C):   65.5 ml 39.08 ml/m  RA Volume:   19.20 ml  11.46 ml/m LA Vol (A4C):   28.2 ml 16.82 ml/m LA Biplane Vol: 43.0 ml 25.66 ml/m  AORTIC VALVE AV Area (Vmax):    2.03 cm AV Area (Vmean):   2.10 cm AV Area (VTI):     2.14 cm AV Vmax:           166.00 cm/s AV Vmean:          106.000 cm/s AV VTI:            0.290 m AV Peak Grad:      11.0 mmHg AV Mean Grad:      6.0 mmHg LVOT Vmax:         119.00 cm/s LVOT Vmean:        78.400 cm/s LVOT VTI:          0.219 m LVOT/AV VTI ratio: 0.76  AORTA Ao Root diam: 3.10 cm MITRAL VALVE MV Area (PHT): 4.06 cm    SHUNTS MV Decel Time: 187 msec    Systemic VTI:  0.22 m MV E velocity: 87.80 cm/s  Systemic Diam: 1.90 cm MV A velocity: 75.00 cm/s MV E/A ratio:  1.17 Mihai Croitoru MD Electronically signed by Thurmon Fair MD Signature Date/Time: 05/22/2023/12:49:20 PM    Final    US Carotid Bilateral Result Date: 05/21/2023 CLINICAL DATA:  Syncope EXAM: BILATERAL CAROTID DUPLEX ULTRASOUND TECHNIQUE: Wallace Cullens scale imaging, color Doppler and duplex ultrasound were performed of bilateral carotid and vertebral arteries in the neck. COMPARISON:  None Available. FINDINGS: Criteria: Quantification of carotid stenosis is based on velocity parameters that correlate the residual internal carotid diameter with NASCET-based stenosis levels, using the diameter of the distal internal carotid lumen as the denominator for stenosis measurement. The following velocity measurements were obtained: RIGHT ICA: 114/35 cm/sec CCA: 118/16 cm/sec SYSTOLIC ICA/CCA RATIO:  1.3 ECA:  68 cm/sec LEFT ICA: 92/29 cm/sec CCA: 106/26 cm/sec SYSTOLIC ICA/CCA RATIO:  1.0 ECA:  84 cm/sec RIGHT CAROTID ARTERY: Mild heterogeneous atherosclerotic plaque in the proximal internal carotid artery. By peak systolic velocity criteria, the estimated stenosis is less than 50%. RIGHT  VERTEBRAL ARTERY:  Patent with normal antegrade flow. LEFT CAROTID ARTERY: Mild  heterogeneous atherosclerotic plaque in the proximal internal carotid artery. By peak systolic velocity criteria, the estimated stenosis is less than 50%. LEFT VERTEBRAL ARTERY:  Patent with normal antegrade flow. Other: Incidental note is made of thrombus within the right internal jugular vein. The vein is noncompressible in there is no evidence of color flow. Findings are consistent with acute occlusive thrombus. IMPRESSION: 1. Incidental note is made of acute occlusive thrombus throughout the right internal jugular vein. 2. Mild (1-49%) stenosis proximal right internal carotid artery secondary to heterogenous atherosclerotic plaque. 3. Mild (1-49%) stenosis proximal left internal carotid artery secondary to heterogenous atherosclerotic plaque. 4. Vertebral arteries are patent with normal antegrade flow. Electronically Signed   By: Malachy Moan M.D.   On: 05/21/2023 13:55   CT Angio Chest Pulmonary Embolism (PE) W or WO Contrast Result Date: 05/21/2023 CLINICAL DATA:  History of laryngeal cancer with syncopal episode, shortness of breath, and weakness. Altered mental status EXAM: CT ANGIOGRAPHY CHEST WITH CONTRAST TECHNIQUE: Multidetector CT imaging of the chest was performed using the standard protocol during bolus administration of intravenous contrast. Multiplanar CT image reconstructions and MIPs were obtained to evaluate the vascular anatomy. RADIATION DOSE REDUCTION: This exam was performed according to the departmental dose-optimization program which includes automated exposure control, adjustment of the mA and/or kV according to patient size and/or use of iterative reconstruction technique. CONTRAST:  75mL OMNIPAQUE IOHEXOL 350 MG/ML SOLN COMPARISON:  Same day chest radiograph FINDINGS: Cardiovascular: Right chest wall port terminates in the right atrium. The study is high quality for the evaluation of pulmonary embolism. There are no filling defects in the central, lobar, segmental or subsegmental  pulmonary artery branches to suggest acute pulmonary embolism. Great vessels are normal in course and caliber. Normal heart size. No significant pericardial fluid/thickening. Coronary artery calcifications and aortic atherosclerosis. Mediastinum/Nodes: Imaged thyroid gland without nodules meeting criteria for imaging follow-up by size. Normal esophagus. No pathologically enlarged axillary, supraclavicular, mediastinal, or hilar lymph nodes. Lungs/Pleura: The central airways are patent. Layering secretions within the trachea extending into bilateral bronchi with left lower lobe subsegmental mucous plugging. Mild diffuse bronchial wall thickening. Minimal right apical paraseptal emphysema. Medial bilateral lower lobe tree-in-bud and ground-glass nodules. No pneumothorax. No pleural effusion. Upper abdomen: Partially imaged percutaneous gastrostomy tube in-situ. Musculoskeletal: No acute or abnormal lytic or blastic osseous lesions. Review of the MIP images confirms the above findings. IMPRESSION: 1. No evidence of pulmonary embolism. 2. Findings of bronchitis with medial bilateral lower lobe tree-in-bud and ground-glass nodules, likely infectious/inflammatory. 3. Aortic Atherosclerosis (ICD10-I70.0) and Emphysema (ICD10-J43.9). Coronary artery calcifications. Assessment for potential risk factor modification, dietary therapy or pharmacologic therapy may be warranted, if clinically indicated. Electronically Signed   By: Agustin Cree M.D.   On: 05/21/2023 12:01   US Venous Img Lower Bilateral (DVT) Result Date: 05/21/2023 CLINICAL DATA:  Pulmonary emboli (HCC) EXAM: BILATERAL LOWER EXTREMITY VENOUS DOPPLER ULTRASOUND TECHNIQUE: Gray-scale sonography with graded compression, as well as color Doppler and duplex ultrasound were performed to evaluate the lower extremity deep venous systems from the level of the common femoral vein and including the common femoral, femoral, profunda femoral, popliteal and calf veins  including the posterior tibial, peroneal and gastrocnemius veins when visible. The superficial great saphenous vein was also interrogated. Spectral Doppler was utilized to evaluate flow at rest and with distal augmentation maneuvers in the common femoral, femoral and popliteal veins. COMPARISON:  None Available. FINDINGS: RIGHT  LOWER EXTREMITY Common Femoral Vein: No evidence of thrombus. Normal compressibility, respiratory phasicity and response to augmentation. Saphenofemoral Junction: No evidence of thrombus. Normal compressibility and flow on color Doppler imaging. Profunda Femoral Vein: No evidence of thrombus. Normal compressibility and flow on color Doppler imaging. Femoral Vein: No evidence of thrombus. Normal compressibility, respiratory phasicity and response to augmentation. Popliteal Vein: No evidence of thrombus. Normal compressibility, respiratory phasicity and response to augmentation. Calf Veins: No evidence of thrombus. Normal compressibility and flow on color Doppler imaging. LEFT LOWER EXTREMITY Common Femoral Vein: No evidence of thrombus. Normal compressibility, respiratory phasicity and response to augmentation. Saphenofemoral Junction: No evidence of thrombus. Normal compressibility and flow on color Doppler imaging. Profunda Femoral Vein: No evidence of thrombus. Normal compressibility and flow on color Doppler imaging. Femoral Vein: No evidence of thrombus. Normal compressibility, respiratory phasicity and response to augmentation. Popliteal Vein: No evidence of thrombus. Normal compressibility, respiratory phasicity and response to augmentation. Calf Veins: No evidence of thrombus. Normal compressibility and flow on color Doppler imaging. IMPRESSION: No evidence of deep venous thrombosis in either lower extremity. Electronically Signed   By: Feliberto Harts M.D.   On: 05/21/2023 10:42   DG Chest Port 1 View Result Date: 05/21/2023 CLINICAL DATA:  weakness EXAM: PORTABLE CHEST 1 VIEW  COMPARISON:  Chest x-ray 05/03/2023 FINDINGS: The heart and mediastinal contours are unchanged. Right chest wall accessed Port-A-Cath with tip overlying the expected region the superior caval junction. Atherosclerotic plaque. No focal consolidation. No pulmonary edema. No pleural effusion. No pneumothorax. No acute osseous abnormality. IMPRESSION: 1. No active disease. 2.  Aortic Atherosclerosis (ICD10-I70.0). Electronically Signed   By: Tish Frederickson M.D.   On: 05/21/2023 01:59   CT ABDOMEN PELVIS W CONTRAST Result Date: 05/03/2023 CLINICAL DATA:  Peritonitis or perforation suspected. Abdominal pain. Nausea. Gastrostomy tube insertion. EXAM: CT ABDOMEN AND PELVIS WITH CONTRAST TECHNIQUE: Multidetector CT imaging of the abdomen and pelvis was performed using the standard protocol following bolus administration of intravenous contrast. RADIATION DOSE REDUCTION: This exam was performed according to the departmental dose-optimization program which includes automated exposure control, adjustment of the mA and/or kV according to patient size and/or use of iterative reconstruction technique. CONTRAST:  OMNIPAQUE IOHEXOL 300 MG/ML  SOLN COMPARISON:  PET-CT 03/23/2023. FINDINGS: Lower chest: No acute abnormality. Hepatobiliary: No focal liver abnormality is seen. No gallstones, gallbladder wall thickening, or biliary dilatation. Pancreas: Unremarkable. No pancreatic ductal dilatation or surrounding inflammatory changes. Spleen: Normal in size without focal abnormality. Adrenals/Urinary Tract: Subcentimeter rounded hypodensities in the left kidney are too small to characterize, likely cysts. Otherwise, the adrenal glands, kidneys and bladder are within normal limits. Stomach/Bowel: There is a new percutaneous gastrostomy tube in the body of the stomach. There is mild stranding surrounding the stomach at the level of the gastrostomy tube without fluid collection or free air. The stomach otherwise appears within  normal limits. Appendix is not seen. No evidence of other bowel wall thickening, distention, or inflammatory changes. Vascular/Lymphatic: Aortic atherosclerosis. No enlarged abdominal or pelvic lymph nodes. Reproductive: Prostate is unremarkable. Other: There is a small fat containing umbilical hernia. There is no free fluid or free air. There some subcutaneous and intramuscular edema/stranding at the level of the gastrostomy. No drainable fluid collections are seen. No soft tissue gas. Musculoskeletal: No acute or significant osseous findings. IMPRESSION: 1. New percutaneous gastrostomy tube in the body of the stomach. There is mild stranding surrounding the stomach at the level of the gastrostomy tube without fluid collection  or free air. 2. Subcutaneous and intramuscular edema/stranding at the level of the gastrostomy tube. No drainable fluid collections. 3. No other acute localizing process in the abdomen or pelvis. Aortic Atherosclerosis (ICD10-I70.0). Electronically Signed   By: Darliss Cheney M.D.   On: 05/03/2023 17:35   DG Chest 2 View Result Date: 05/03/2023 CLINICAL DATA:  Productive cough.  Laryngeal carcinoma. EXAM: CHEST - 2 VIEW COMPARISON:  None Available. FINDINGS: The heart size and mediastinal contours are within normal limits. Right-sided Port-A-Cath is seen in appropriate position. Both lungs are clear. The visualized skeletal structures are unremarkable. IMPRESSION: No active cardiopulmonary disease. Electronically Signed   By: Danae Orleans M.D.   On: 05/03/2023 15:36   IR PATIENT EVAL TECH 0-60 MINS Result Date: 05/03/2023 Barrett Shell, RT     05/03/2023  2:21 PM Patient presented to IR today with complaints of redness, tenderness, and swelling around newly placed gastrostomy tube site.  Gastrostomy tube bumper appeared to be tight to skin, likely due to swelling.  After bumper was loosened, pus came out from around the tube site. At this time, S. Rae Roam, [place] consulted D. Deanne Coffer, MD,  who advised patient to go to Emergency Department to be treated for infection.     CODE STATUS:  Code Status History     Date Active Date Inactive Code Status Order ID Comments User Context   06/23/2017 1540 06/28/2017 2102 Full Code [id number]  Laveda Abbe, NP Inpatient   06/23/2017 0327 06/23/2017 1502 Full Code [id number]  Gregary Cromer ED   01/13/2017 0858 01/13/2017 1329 Full Code [id number]  Lucretia Roers, MD Inpatient   11/05/2016 0018 11/10/2016 1937 Full Code [id number]  Kerry Hough, PA-C Inpatient       Orders Placed This Encounter  Procedures   CBC with Differential (Cancer Center Only)    Standing Status:   Future    Expected Date:   05/31/2023    Expiration Date:   05/30/2024   Basic Metabolic Panel - Cancer Center Only    Standing Status:   Future    Expected Date:   05/31/2023    Expiration Date:   05/30/2024   Magnesium    Standing Status:   Future    Expected Date:   05/31/2023    Expiration Date:   05/30/2024     Future Appointments  Date Time Provider Department Center  05/31/2023  8:30 AM [facility] None  05/31/2023  9:30 AM [facility] [id number] [facility] None  05/31/2023  9:45 AM LINAC-SQUIRE [facility] None  05/31/2023 10:00 AM [facility] INFUSION [facility] None  05/31/2023  3:15 PM Alphonse Guild, RD [facility] None  06/01/2023  8:30 AM [facility] None  06/01/2023  9:30 AM [facility] [id number] [facility] None  06/01/2023  9:45 AM LINAC-SQUIRE [facility] None  06/02/2023 11:30 AM [facility] None  06/02/2023 12:30 PM [facility] LINAC 3 [facility] None  06/02/2023  1:00 PM [facility] FLUSH [facility] None  06/03/2023  1:15 PM [facility] None  06/03/2023  2:15 PM [facility] [id number] [facility] None  06/03/2023  2:45 PM [facility] FLUSH [facility] None  06/04/2023 11:00 AM [facility] LAB [facility] None  06/04/2023 11:30 AM Ayumi Wangerin, MD [facility] None  06/04/2023 11:45 AM [facility] [id number]  [facility] None  06/07/2023 11:45 AM [facility] [id number] [facility] None  06/08/2023 11:45 AM [facility] [id number] [facility] None  06/11/2023  1:00 PM [facility] SPEECH THERAPIST [facility] [place]  06/11/2023  1:00 PM [facility] R/F 1 [facility]  [place]  06/22/2023  2:00 PM Nona Dell [facility]  06/22/2023  3:00 PM Jeanella Craze, [place], PT [facility] None  08/04/2023  1:40 PM [name], MD [facility] RPC      This document was completed utilizing speech recognition software. Grammatical errors, random word insertions, pronoun errors, and incomplete sentences are an occasional consequence of this system due to software limitations, ambient noise, and hardware issues. Any formal questions or concerns about the content, text or information contained within the body of this dictation should be directly addressed to the provider for clarification.

## 2023-05-28 NOTE — Assessment & Plan Note (Signed)
 Found during recent hospitalization.  He is currently on Eliquis 5 mg p.o. twice daily.  Plan to continue anticoagulation at least for 3 months followed by reevaluation.

## 2023-05-29 DIAGNOSIS — B182 Chronic viral hepatitis C: Secondary | ICD-10-CM | POA: Diagnosis not present

## 2023-05-29 DIAGNOSIS — D630 Anemia in neoplastic disease: Secondary | ICD-10-CM | POA: Diagnosis not present

## 2023-05-29 DIAGNOSIS — C321 Malignant neoplasm of supraglottis: Secondary | ICD-10-CM | POA: Diagnosis not present

## 2023-05-29 DIAGNOSIS — I10 Essential (primary) hypertension: Secondary | ICD-10-CM | POA: Diagnosis not present

## 2023-05-29 DIAGNOSIS — I82C11 Acute embolism and thrombosis of right internal jugular vein: Secondary | ICD-10-CM | POA: Diagnosis not present

## 2023-05-29 DIAGNOSIS — F319 Bipolar disorder, unspecified: Secondary | ICD-10-CM | POA: Diagnosis not present

## 2023-05-29 DIAGNOSIS — Z431 Encounter for attention to gastrostomy: Secondary | ICD-10-CM | POA: Diagnosis not present

## 2023-05-29 DIAGNOSIS — A419 Sepsis, unspecified organism: Secondary | ICD-10-CM | POA: Diagnosis not present

## 2023-05-29 DIAGNOSIS — J189 Pneumonia, unspecified organism: Secondary | ICD-10-CM | POA: Diagnosis not present

## 2023-05-31 ENCOUNTER — Other Ambulatory Visit

## 2023-05-31 ENCOUNTER — Telehealth (HOSPITAL_COMMUNITY): Payer: Self-pay

## 2023-05-31 ENCOUNTER — Inpatient Hospital Stay: Payer: Medicare HMO | Attending: Oncology

## 2023-05-31 ENCOUNTER — Inpatient Hospital Stay: Payer: Medicare HMO

## 2023-05-31 ENCOUNTER — Other Ambulatory Visit: Payer: Self-pay

## 2023-05-31 ENCOUNTER — Ambulatory Visit
Admission: RE | Admit: 2023-05-31 | Discharge: 2023-05-31 | Disposition: A | Discharge status: Home / Self Care | Payer: Medicare HMO | Source: Ambulatory Visit | Attending: Radiation Oncology | Admitting: Radiation Oncology

## 2023-05-31 ENCOUNTER — Inpatient Hospital Stay: Payer: Self-pay

## 2023-05-31 VITALS — BP 130/89 | HR 50 | Temp 98.0°F | Resp 16 | Wt 139.0 lb

## 2023-05-31 DIAGNOSIS — R110 Nausea: Secondary | ICD-10-CM | POA: Insufficient documentation

## 2023-05-31 DIAGNOSIS — F4001 Agoraphobia with panic disorder: Secondary | ICD-10-CM

## 2023-05-31 DIAGNOSIS — C321 Malignant neoplasm of supraglottis: Secondary | ICD-10-CM | POA: Insufficient documentation

## 2023-05-31 DIAGNOSIS — Z9221 Personal history of antineoplastic chemotherapy: Secondary | ICD-10-CM | POA: Insufficient documentation

## 2023-05-31 DIAGNOSIS — K429 Umbilical hernia without obstruction or gangrene: Secondary | ICD-10-CM | POA: Insufficient documentation

## 2023-05-31 DIAGNOSIS — F333 Major depressive disorder, recurrent, severe with psychotic symptoms: Secondary | ICD-10-CM

## 2023-05-31 DIAGNOSIS — Z7952 Long term (current) use of systemic steroids: Secondary | ICD-10-CM | POA: Insufficient documentation

## 2023-05-31 DIAGNOSIS — F411 Generalized anxiety disorder: Secondary | ICD-10-CM

## 2023-05-31 DIAGNOSIS — Z79899 Other long term (current) drug therapy: Secondary | ICD-10-CM | POA: Insufficient documentation

## 2023-05-31 DIAGNOSIS — Z923 Personal history of irradiation: Secondary | ICD-10-CM | POA: Insufficient documentation

## 2023-05-31 DIAGNOSIS — D72819 Decreased white blood cell count, unspecified: Secondary | ICD-10-CM | POA: Diagnosis not present

## 2023-05-31 DIAGNOSIS — I700 Atherosclerosis of aorta: Secondary | ICD-10-CM | POA: Insufficient documentation

## 2023-05-31 DIAGNOSIS — Z5111 Encounter for antineoplastic chemotherapy: Secondary | ICD-10-CM | POA: Insufficient documentation

## 2023-05-31 DIAGNOSIS — L03311 Cellulitis of abdominal wall: Secondary | ICD-10-CM | POA: Insufficient documentation

## 2023-05-31 DIAGNOSIS — I82C11 Acute embolism and thrombosis of right internal jugular vein: Secondary | ICD-10-CM | POA: Diagnosis not present

## 2023-05-31 DIAGNOSIS — K219 Gastro-esophageal reflux disease without esophagitis: Secondary | ICD-10-CM | POA: Diagnosis not present

## 2023-05-31 DIAGNOSIS — Z510 Encounter for antineoplastic radiation therapy: Secondary | ICD-10-CM | POA: Insufficient documentation

## 2023-05-31 DIAGNOSIS — Z7901 Long term (current) use of anticoagulants: Secondary | ICD-10-CM | POA: Insufficient documentation

## 2023-05-31 LAB — RAD ONC ARIA SESSION SUMMARY
Course Elapsed Days: 47
Plan Fractions Treated to Date: 29
Plan Prescribed Dose Per Fraction: 2 Gy
Plan Total Fractions Prescribed: 35
Plan Total Prescribed Dose: 70 Gy
Reference Point Dosage Given to Date: 58 Gy
Reference Point Session Dosage Given: 2 Gy
Session Number: 29

## 2023-05-31 MED ORDER — QUETIAPINE FUMARATE 100 MG PO TABS: 100.0000 mg | ORAL_TABLET | Freq: Every day | ORAL | 1 refills | Status: DC

## 2023-05-31 MED ORDER — NUTREN 1.5 EN LIQD: ENTERAL | Status: DC

## 2023-05-31 MED ORDER — SODIUM CHLORIDE 0.9 % IV SOLN: INTRAVENOUS | Status: DC

## 2023-05-31 MED ORDER — SODIUM CHLORIDE 0.9 % IV SOLN
150.0000 mg | Freq: Once | INTRAVENOUS | Status: AC
  Administered 2023-05-31: 150 mg via INTRAVENOUS
  Filled 2023-05-31: qty 150

## 2023-05-31 MED ORDER — DEXAMETHASONE SODIUM PHOSPHATE 10 MG/ML IJ SOLN
10.0000 mg | Freq: Once | INTRAMUSCULAR | Status: AC
  Administered 2023-05-31: 10 mg via INTRAVENOUS
  Filled 2023-05-31: qty 1

## 2023-05-31 MED ORDER — PALONOSETRON HCL INJECTION 0.25 MG/5ML
0.2500 mg | Freq: Once | INTRAVENOUS | Status: AC
  Administered 2023-05-31: 0.25 mg via INTRAVENOUS
  Filled 2023-05-31: qty 5

## 2023-05-31 MED ORDER — SODIUM CHLORIDE 0.9 % IV SOLN
30.0000 mg/m2 | Freq: Once | INTRAVENOUS | Status: AC
  Administered 2023-05-31: 50 mg via INTRAVENOUS
  Filled 2023-05-31: qty 50

## 2023-05-31 MED ORDER — SODIUM CHLORIDE 0.9% FLUSH
10.0000 mL | INTRAVENOUS | Status: DC | PRN
  Administered 2023-05-31: 10 mL

## 2023-05-31 MED ORDER — HEPARIN SOD (PORK) LOCK FLUSH 100 UNIT/ML IV SOLN
500.0000 [IU] | Freq: Once | INTRAVENOUS | Status: AC | PRN
Start: 2023-05-31 — End: 2023-05-31
  Administered 2023-05-31: 500 [IU]

## 2023-05-31 MED ORDER — MAGNESIUM SULFATE 2 GM/50ML IV SOLN
2.0000 g | Freq: Once | INTRAVENOUS | Status: AC
  Administered 2023-05-31: 2 g via INTRAVENOUS
  Filled 2023-05-31: qty 50

## 2023-05-31 MED ORDER — POTASSIUM CHLORIDE IN NACL 20-0.9 MEQ/L-% IV SOLN
Freq: Once | INTRAVENOUS | Status: AC
  Filled 2023-05-31: qty 1000

## 2023-05-31 NOTE — Progress Notes (Signed)
 Nutrition Follow-up:  Patient with SCC of supraglottis, stage III.  Patient receiving concurrent chemotherapy and radiation.  Hospital admission 2/21-2/27 with acute DVT of IJV, CAP, sepsis, syncope, anemia.    Met with patient during infusion.  Patient reports that he is not tolerating bolus tube feeding at home since discharge.  Was not sent home with pump on continuous tube feeding.  Only able to take 3 cartons of tube feeding via bolus at home (needs 7).  Reports feeling full, bloated.  Says that he tolerated continuous tube feeding during recent inpatient stay 2/21-2/27 and on 2/3-2/7.  Unable to take in oral nutrition due to side effects from radiation.  Weight continues to decline.  Taking few sips of liquids orally    Medications: reviewed  Labs: glucose 125, phosphorus on 4.3 WNL 2/26  Anthropometrics:   Weight 139 lb today (decreased)  148 lb 2 oz on 2/17 143 lb on 2/7 148 lb on 1/31 151 lb on 1/6  8% weight loss in the last 2 months, significant   Estimated Energy Needs  Kcals: 2100-2450 Protein: 105-122 g Fluid: > 2.1 L  NUTRITION DIAGNOSIS: Predicted sub optimal energy intake continues    INTERVENTION:  Recommend continuous tube feeding of nutren 1.5 at goal rate of 32ml/hr for 18 hours (start 15:00-9:00).  Flush with of water at 1500, 1700, 2300, 900.   Start at 48ml/hr and if tolerating after 8 hour increase to 51ml/hr.  Continue increasing tube feeding by 10ml q 8 hours until goal rate of 96ml/hr reached for 18 hours/day. Provides 2625 calories, 119 g protein, 2297 ml free water (with formula and flush) Written schedule dicussed and provided to patient of tube feeding regimen.   Meets 100% of calorie needs Anticipate long term need of enteral nutrition.    MONITORING, EVALUATION, GOAL: weight trends, tube feeding   NEXT VISIT: Monday, March 10 during infusion  Sanvi Ehler B. Freida Busman, RD, LDN Registered Dietitian [phone]

## 2023-05-31 NOTE — Telephone Encounter (Signed)
 Copied from CRM [id number]. Topic: Clinical - Home Health Verbal Orders >> May 31, 2023 11:43 AM Maree Krabbe H wrote: Caller/Agency: [facility] Callback Number: [phone] Service Requested:  Frequency: 1 time a week for 4 weeks, 1 month, 2 PRNS, Any new concerns about the patient? No but want to add PT and speech

## 2023-05-31 NOTE — Telephone Encounter (Signed)
 Pt aware of Dr Denyce Robert message and verbalized understanding

## 2023-05-31 NOTE — Telephone Encounter (Signed)
 Pt states that he is taking 2 per night he didn't make any changes to it

## 2023-05-31 NOTE — Telephone Encounter (Signed)
 Received a fax from [facility] on [address] requesting a refill on his Quetiapine 50 MG Tablets. Pt is scheduled for 06/08/23. Please advise.

## 2023-05-31 NOTE — Patient Instructions (Signed)

## 2023-05-31 NOTE — Progress Notes (Signed)
 Okay to run post hydration fluids with Cisplatin today per MD.

## 2023-05-31 NOTE — Addendum Note (Signed)
 Addended by: Alphonse Guild B on: 05/31/2023 02:23 PM   Modules accepted: Orders

## 2023-06-01 ENCOUNTER — Ambulatory Visit: Payer: Medicare HMO

## 2023-06-01 ENCOUNTER — Ambulatory Visit
Admission: RE | Admit: 2023-06-01 | Discharge: 2023-06-01 | Disposition: A | Discharge status: Home / Self Care | Payer: Medicare HMO | Source: Ambulatory Visit | Attending: Radiation Oncology

## 2023-06-01 ENCOUNTER — Other Ambulatory Visit: Payer: Self-pay

## 2023-06-01 ENCOUNTER — Inpatient Hospital Stay: Payer: Medicare HMO

## 2023-06-01 ENCOUNTER — Telehealth: Payer: Self-pay | Admitting: Internal Medicine

## 2023-06-01 DIAGNOSIS — I700 Atherosclerosis of aorta: Secondary | ICD-10-CM | POA: Diagnosis not present

## 2023-06-01 DIAGNOSIS — Z923 Personal history of irradiation: Secondary | ICD-10-CM | POA: Diagnosis not present

## 2023-06-01 DIAGNOSIS — Z510 Encounter for antineoplastic radiation therapy: Secondary | ICD-10-CM | POA: Diagnosis not present

## 2023-06-01 DIAGNOSIS — R110 Nausea: Secondary | ICD-10-CM | POA: Diagnosis not present

## 2023-06-01 DIAGNOSIS — Z9221 Personal history of antineoplastic chemotherapy: Secondary | ICD-10-CM | POA: Diagnosis not present

## 2023-06-01 DIAGNOSIS — Z7901 Long term (current) use of anticoagulants: Secondary | ICD-10-CM | POA: Diagnosis not present

## 2023-06-01 DIAGNOSIS — D72819 Decreased white blood cell count, unspecified: Secondary | ICD-10-CM | POA: Diagnosis not present

## 2023-06-01 DIAGNOSIS — K219 Gastro-esophageal reflux disease without esophagitis: Secondary | ICD-10-CM | POA: Diagnosis not present

## 2023-06-01 DIAGNOSIS — C321 Malignant neoplasm of supraglottis: Secondary | ICD-10-CM | POA: Diagnosis not present

## 2023-06-01 DIAGNOSIS — I82C11 Acute embolism and thrombosis of right internal jugular vein: Secondary | ICD-10-CM | POA: Diagnosis not present

## 2023-06-01 LAB — RAD ONC ARIA SESSION SUMMARY
Course Elapsed Days: 48
Plan Fractions Treated to Date: 30
Plan Prescribed Dose Per Fraction: 2 Gy
Plan Total Fractions Prescribed: 35
Plan Total Prescribed Dose: 70 Gy
Reference Point Dosage Given to Date: 60 Gy
Reference Point Session Dosage Given: 2 Gy
Session Number: 30

## 2023-06-01 NOTE — Telephone Encounter (Signed)
 Copied from CRM [id number]. Topic: Clinical - Home Health Verbal Orders >> May 31, 2023 11:43 AM Maree Krabbe H wrote: Caller/Agency: [facility] Callback Number: [phone] Service Requested: Physical Therapy and Speech Therapy Frequency: 1 time a week for 4 weeks, 1 month, 2 PRNS, Any new concerns about the patient? No but want to add PT and speech

## 2023-06-01 NOTE — Telephone Encounter (Signed)
 Verbal orders provided.

## 2023-06-02 ENCOUNTER — Ambulatory Visit: Payer: Medicare HMO

## 2023-06-02 ENCOUNTER — Other Ambulatory Visit: Payer: Self-pay

## 2023-06-02 ENCOUNTER — Ambulatory Visit

## 2023-06-02 ENCOUNTER — Inpatient Hospital Stay: Payer: Medicare HMO

## 2023-06-02 ENCOUNTER — Inpatient Hospital Stay
Admission: RE | Admit: 2023-06-02 | Discharge: 2023-06-02 | Discharge status: Home / Self Care | Payer: Self-pay | Source: Ambulatory Visit | Attending: Radiation Oncology

## 2023-06-02 VITALS — BP 105/71 | HR 58 | Temp 98.3°F | Resp 16

## 2023-06-02 DIAGNOSIS — Z923 Personal history of irradiation: Secondary | ICD-10-CM | POA: Diagnosis not present

## 2023-06-02 DIAGNOSIS — Z7901 Long term (current) use of anticoagulants: Secondary | ICD-10-CM | POA: Diagnosis not present

## 2023-06-02 DIAGNOSIS — I82C11 Acute embolism and thrombosis of right internal jugular vein: Secondary | ICD-10-CM | POA: Diagnosis not present

## 2023-06-02 DIAGNOSIS — Z510 Encounter for antineoplastic radiation therapy: Secondary | ICD-10-CM | POA: Diagnosis not present

## 2023-06-02 DIAGNOSIS — D72819 Decreased white blood cell count, unspecified: Secondary | ICD-10-CM | POA: Diagnosis not present

## 2023-06-02 DIAGNOSIS — R110 Nausea: Secondary | ICD-10-CM | POA: Diagnosis not present

## 2023-06-02 DIAGNOSIS — Z9221 Personal history of antineoplastic chemotherapy: Secondary | ICD-10-CM | POA: Diagnosis not present

## 2023-06-02 DIAGNOSIS — C321 Malignant neoplasm of supraglottis: Secondary | ICD-10-CM

## 2023-06-02 DIAGNOSIS — K219 Gastro-esophageal reflux disease without esophagitis: Secondary | ICD-10-CM | POA: Diagnosis not present

## 2023-06-02 DIAGNOSIS — I700 Atherosclerosis of aorta: Secondary | ICD-10-CM | POA: Diagnosis not present

## 2023-06-02 LAB — RAD ONC ARIA SESSION SUMMARY
Course Elapsed Days: 49
Plan Fractions Treated to Date: 31
Plan Prescribed Dose Per Fraction: 2 Gy
Plan Total Fractions Prescribed: 35
Plan Total Prescribed Dose: 70 Gy
Reference Point Dosage Given to Date: 62 Gy
Reference Point Session Dosage Given: 2 Gy
Session Number: 31

## 2023-06-02 MED ORDER — FILGRASTIM-SNDZ 300 MCG/0.5ML IJ SOSY
300.0000 ug | PREFILLED_SYRINGE | Freq: Once | INTRAMUSCULAR | Status: AC
  Administered 2023-06-02: 300 ug via SUBCUTANEOUS
  Filled 2023-06-02: qty 0.5

## 2023-06-02 NOTE — Patient Instructions (Signed)
 Filgrastim Injection What is this medication? FILGRASTIM (fil GRA stim) lowers the risk of infection in people who are receiving chemotherapy. It works by Systems analyst make more white blood cells, which protects your body from infection. It may also be used to help people who have been exposed to high doses of radiation. It can be used to help prepare your body before a stem cell transplant. It works by helping your bone marrow make and release stem cells into the blood. This medicine may be used for other purposes; ask your health care provider or pharmacist if you have questions. COMMON BRAND NAME(S): Neupogen, Nivestym, Nypozi, Releuko, Zarxio What should I tell my care team before I take this medication? They need to know if you have any of these conditions: History of blood diseases, such as sickle cell anemia Kidney disease Recent or ongoing radiation An unusual or allergic reaction to filgrastim, pegfilgrastim, latex, rubber, other medications, foods, dyes, or preservatives Pregnant or trying to get pregnant Breast-feeding How should I use this medication? This medication is injected under the skin or into a vein. It is usually given by your care team in a hospital or clinic setting. It may be given at home. If you get this medication at home, you will be taught how to prepare and give it. Use exactly as directed. Take it as directed on the prescription label at the same time every day. Keep taking it unless your care team tells you to stop. It is important that you put your used needles and syringes in a special sharps container. Do not put them in a trash can. If you do not have a sharps container, call your pharmacist or care team to get one. This medication comes with INSTRUCTIONS FOR USE. Ask your pharmacist for directions on how to use this medication. Read the information carefully. Talk to your pharmacist or care team if you have questions. Talk to your care team about the use of  this medication in children. While it may be prescribed for children for selected conditions, precautions do apply. Overdosage: If you think you have taken too much of this medicine contact a poison control center or emergency room at once. NOTE: This medicine is only for you. Do not share this medicine with others. What if I miss a dose? It is important not to miss any doses. Talk to your care team about what to do if you miss a dose. What may interact with this medication? Medications that may cause a release of neutrophils, such as lithium This list may not describe all possible interactions. Give your health care provider a list of all the medicines, herbs, non-prescription drugs, or dietary supplements you use. Also tell them if you smoke, drink alcohol, or use illegal drugs. Some items may interact with your medicine. What should I watch for while using this medication? Your condition will be monitored carefully while you are receiving this medication. You may need bloodwork while taking this medication. Talk to your care team about your risk of cancer. You may be more at risk for certain types of cancer if you take this medication. What side effects may I notice from receiving this medication? Side effects that you should report to your care team as soon as possible: Allergic reactions--skin rash, itching, hives, swelling of the face, lips, tongue, or throat Capillary leak syndrome--stomach or muscle pain, unusual weakness or fatigue, feeling faint or lightheaded, decrease in the amount of urine, swelling of the ankles, hands,  or feet, trouble breathing High white blood cell level--fever, fatigue, trouble breathing, night sweats, change in vision, weight loss Inflammation of the aorta--fever, fatigue, back, chest, or stomach pain, severe headache Kidney injury (glomerulonephritis)--decrease in the amount of urine, red or dark brown urine, foamy or bubbly urine, swelling of the ankles, hands,  or feet Shortness of breath or trouble breathing Spleen injury--pain in upper left stomach or shoulder Unusual bruising or bleeding Side effects that usually do not require medical attention (report to your care team if they continue or are bothersome): Back pain Bone pain Fatigue Fever Headache Nausea This list may not describe all possible side effects. Call your doctor for medical advice about side effects. You may report side effects to FDA at 1-800-FDA-1088. Where should I keep my medication? Keep out of the reach of children and pets. Keep this medication in the original packaging until you are ready to take it. Protect from light. See product for storage information. Each product may have different instructions. Get rid of any unused medication after the expiration date. To get rid of medications that are no longer needed or have expired: Take the medication to a medications take-back program. Check with your pharmacy or law enforcement to find a location. If you cannot return the medication, ask your pharmacist or care team how to get rid of this medication safely. NOTE: This sheet is a summary. It may not cover all possible information. If you have questions about this medicine, talk to your doctor, pharmacist, or health care provider.  2024 Elsevier/Gold Standard (2021-08-07 00:00:00)

## 2023-06-03 ENCOUNTER — Other Ambulatory Visit: Payer: Self-pay

## 2023-06-03 ENCOUNTER — Inpatient Hospital Stay: Payer: Medicare HMO

## 2023-06-03 ENCOUNTER — Ambulatory Visit
Admission: RE | Admit: 2023-06-03 | Discharge: 2023-06-03 | Disposition: A | Discharge status: Home / Self Care | Payer: Medicare HMO | Source: Ambulatory Visit | Attending: Radiation Oncology | Admitting: Radiation Oncology

## 2023-06-03 VITALS — BP 110/70 | HR 51 | Temp 98.8°F | Resp 18

## 2023-06-03 DIAGNOSIS — E43 Unspecified severe protein-calorie malnutrition: Secondary | ICD-10-CM | POA: Diagnosis not present

## 2023-06-03 DIAGNOSIS — Z510 Encounter for antineoplastic radiation therapy: Secondary | ICD-10-CM | POA: Diagnosis not present

## 2023-06-03 DIAGNOSIS — D72819 Decreased white blood cell count, unspecified: Secondary | ICD-10-CM | POA: Diagnosis not present

## 2023-06-03 DIAGNOSIS — I82C11 Acute embolism and thrombosis of right internal jugular vein: Secondary | ICD-10-CM | POA: Diagnosis not present

## 2023-06-03 DIAGNOSIS — Z9221 Personal history of antineoplastic chemotherapy: Secondary | ICD-10-CM | POA: Diagnosis not present

## 2023-06-03 DIAGNOSIS — K219 Gastro-esophageal reflux disease without esophagitis: Secondary | ICD-10-CM | POA: Diagnosis not present

## 2023-06-03 DIAGNOSIS — C321 Malignant neoplasm of supraglottis: Secondary | ICD-10-CM | POA: Diagnosis not present

## 2023-06-03 DIAGNOSIS — R110 Nausea: Secondary | ICD-10-CM | POA: Diagnosis not present

## 2023-06-03 DIAGNOSIS — Z923 Personal history of irradiation: Secondary | ICD-10-CM | POA: Diagnosis not present

## 2023-06-03 DIAGNOSIS — Z7901 Long term (current) use of anticoagulants: Secondary | ICD-10-CM | POA: Diagnosis not present

## 2023-06-03 DIAGNOSIS — I700 Atherosclerosis of aorta: Secondary | ICD-10-CM | POA: Diagnosis not present

## 2023-06-03 DIAGNOSIS — R1312 Dysphagia, oropharyngeal phase: Secondary | ICD-10-CM | POA: Diagnosis not present

## 2023-06-03 LAB — RAD ONC ARIA SESSION SUMMARY
Course Elapsed Days: 50
Plan Fractions Treated to Date: 32
Plan Prescribed Dose Per Fraction: 2 Gy
Plan Total Fractions Prescribed: 35
Plan Total Prescribed Dose: 70 Gy
Reference Point Dosage Given to Date: 64 Gy
Reference Point Session Dosage Given: 2 Gy
Session Number: 32

## 2023-06-03 MED ORDER — FILGRASTIM-SNDZ 300 MCG/0.5ML IJ SOSY
300.0000 ug | PREFILLED_SYRINGE | Freq: Once | INTRAMUSCULAR | Status: AC
  Administered 2023-06-03: 300 ug via SUBCUTANEOUS
  Filled 2023-06-03: qty 0.5

## 2023-06-04 ENCOUNTER — Other Ambulatory Visit: Payer: Self-pay

## 2023-06-04 ENCOUNTER — Inpatient Hospital Stay

## 2023-06-04 ENCOUNTER — Inpatient Hospital Stay: Payer: Medicare HMO | Admitting: Oncology

## 2023-06-04 ENCOUNTER — Other Ambulatory Visit: Payer: Medicare HMO

## 2023-06-04 ENCOUNTER — Encounter: Payer: Self-pay | Admitting: Oncology

## 2023-06-04 ENCOUNTER — Ambulatory Visit
Admission: RE | Admit: 2023-06-04 | Discharge: 2023-06-04 | Disposition: A | Discharge status: Home / Self Care | Payer: Self-pay | Source: Ambulatory Visit | Attending: Radiation Oncology | Admitting: Radiation Oncology

## 2023-06-04 ENCOUNTER — Ambulatory Visit
Admission: RE | Admit: 2023-06-04 | Discharge: 2023-06-04 | Disposition: A | Discharge status: Home / Self Care | Source: Ambulatory Visit | Attending: Radiation Oncology | Admitting: Radiation Oncology

## 2023-06-04 VITALS — BP 121/80 | HR 55 | Temp 97.4°F | Resp 16 | Wt 134.1 lb

## 2023-06-04 DIAGNOSIS — Z95828 Presence of other vascular implants and grafts: Secondary | ICD-10-CM

## 2023-06-04 DIAGNOSIS — Z9221 Personal history of antineoplastic chemotherapy: Secondary | ICD-10-CM | POA: Diagnosis not present

## 2023-06-04 DIAGNOSIS — Z5111 Encounter for antineoplastic chemotherapy: Secondary | ICD-10-CM

## 2023-06-04 DIAGNOSIS — K219 Gastro-esophageal reflux disease without esophagitis: Secondary | ICD-10-CM | POA: Diagnosis not present

## 2023-06-04 DIAGNOSIS — C321 Malignant neoplasm of supraglottis: Secondary | ICD-10-CM | POA: Diagnosis not present

## 2023-06-04 DIAGNOSIS — I82C11 Acute embolism and thrombosis of right internal jugular vein: Secondary | ICD-10-CM | POA: Diagnosis not present

## 2023-06-04 DIAGNOSIS — D72819 Decreased white blood cell count, unspecified: Secondary | ICD-10-CM | POA: Diagnosis not present

## 2023-06-04 DIAGNOSIS — Z7901 Long term (current) use of anticoagulants: Secondary | ICD-10-CM | POA: Diagnosis not present

## 2023-06-04 DIAGNOSIS — Z923 Personal history of irradiation: Secondary | ICD-10-CM | POA: Diagnosis not present

## 2023-06-04 DIAGNOSIS — R110 Nausea: Secondary | ICD-10-CM | POA: Diagnosis not present

## 2023-06-04 DIAGNOSIS — I700 Atherosclerosis of aorta: Secondary | ICD-10-CM | POA: Diagnosis not present

## 2023-06-04 DIAGNOSIS — Z510 Encounter for antineoplastic radiation therapy: Secondary | ICD-10-CM | POA: Diagnosis not present

## 2023-06-04 LAB — CBC WITH DIFFERENTIAL (CANCER CENTER ONLY)
Abs Immature Granulocytes: 0 10*3/uL (ref 0.00–0.07)
Band Neutrophils: 14 %
Basophils Absolute: 0 10*3/uL (ref 0.0–0.1)
Basophils Relative: 0 %
Eosinophils Absolute: 0 10*3/uL (ref 0.0–0.5)
Eosinophils Relative: 0 %
HCT: 33.1 % — ABNORMAL LOW (ref 39.0–52.0)
Hemoglobin: 11 g/dL — ABNORMAL LOW (ref 13.0–17.0)
Lymphocytes Relative: 12 %
Lymphs Abs: 0.6 10*3/uL — ABNORMAL LOW (ref 0.7–4.0)
MCH: 29.8 pg (ref 26.0–34.0)
MCHC: 33.2 g/dL (ref 30.0–36.0)
MCV: 89.7 fL (ref 80.0–100.0)
Monocytes Absolute: 0.4 10*3/uL (ref 0.1–1.0)
Monocytes Relative: 7 %
Neutro Abs: 4.2 10*3/uL (ref 1.7–7.7)
Neutrophils Relative %: 67 %
Platelet Count: 299 10*3/uL (ref 150–400)
RBC: 3.69 MIL/uL — ABNORMAL LOW (ref 4.22–5.81)
RDW: 14 % (ref 11.5–15.5)
WBC Count: 5.2 10*3/uL (ref 4.0–10.5)
nRBC: 0 % (ref 0.0–0.2)

## 2023-06-04 LAB — BASIC METABOLIC PANEL - CANCER CENTER ONLY
Anion gap: 6 (ref 5–15)
BUN: 21 mg/dL — ABNORMAL HIGH (ref 6–20)
CO2: 30 mmol/L (ref 22–32)
Calcium: 9.2 mg/dL (ref 8.9–10.3)
Chloride: 101 mmol/L (ref 98–111)
Creatinine: 1.22 mg/dL (ref 0.61–1.24)
GFR, Estimated: >60 mL/min (ref >60)
Glucose, Bld: 82 mg/dL (ref 70–99)
Potassium: 4.2 mmol/L (ref 3.5–5.1)
Sodium: 137 mmol/L (ref 135–145)

## 2023-06-04 LAB — MAGNESIUM: Magnesium: 1.9 mg/dL (ref 1.7–2.4)

## 2023-06-04 LAB — RAD ONC ARIA SESSION SUMMARY
Course Elapsed Days: 51
Plan Fractions Treated to Date: 33
Plan Prescribed Dose Per Fraction: 2 Gy
Plan Total Fractions Prescribed: 35
Plan Total Prescribed Dose: 70 Gy
Reference Point Dosage Given to Date: 66 Gy
Reference Point Session Dosage Given: 2 Gy
Session Number: 33

## 2023-06-04 MED ORDER — SODIUM CHLORIDE 0.9% FLUSH
10.0000 mL | Freq: Once | INTRAVENOUS | Status: AC
  Administered 2023-06-04: 10 mL

## 2023-06-04 MED ORDER — HEPARIN SOD (PORK) LOCK FLUSH 100 UNIT/ML IV SOLN
500.0000 [IU] | Freq: Once | INTRAVENOUS | Status: AC
  Administered 2023-06-04: 500 [IU]

## 2023-06-04 NOTE — Assessment & Plan Note (Signed)
 Found during recent hospitalization.  He is currently on Eliquis 5 mg p.o. twice daily.  Plan to continue anticoagulation at least for 3 months followed by reevaluation.

## 2023-06-04 NOTE — Assessment & Plan Note (Addendum)
 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   Discussed his case in our tumor conference on 04/14/2023.  Based on imaging studies, it is classified as cT3 lesion.  cN1.  Hence plan is to proceed with concurrent chemoradiation.  We have discussed about role of cisplatin being a radiosensitizer in the treatment of head and neck cancer.  We have discussed about the curative intent of chemoradiation for this patient.     -He began radiation treatments from 04/14/2023.  Began chemotherapy with cisplatin from 04/19/2023.  He has been tolerating chemoradiation well without any noticeable side effects.  Recently he was hospitalized from 05/03/2023 until 05/07/2023 for cellulitis around the site of he G-tube insertion.  He was treated with IV antibiotics initially and later completed course of oral antibiotics.  We held chemotherapy that week.  He was hospitalized again from 05/21/2023 until 05/27/2023 at [facility] for pneumonia and sepsis.  Currently on antibiotics again.  We held chemotherapy this week and he also missed his radiation treatments.  He resumed cycle 6 of chemotherapy on 05/31/2023.  We dose reduced cisplatin to 30 mg/m dose and he also received Zarxio for 2 days as he had leukopenia.   Labs today reveal normal white count of 5200 with ANC of 4200.  Hemoglobin 11.  Platelet count normal at 299,000.  BMP unremarkable.  He will proceed with cycle 7 of cisplatin at 30 mg/m dose on 06/08/2023 as scheduled.  This will complete planned course of chemotherapy.  He is scheduled to complete radiation on 06/08/2023 as well.  -He has prescriptions available for nausea medicines.  Emphasized adequate hydration.  Patient verbalized understanding.  Going forward we will continue surveillance as per NCCN guidelines.  I will see him approximately in 3 weeks for routine follow-up and repeat labs.

## 2023-06-04 NOTE — Progress Notes (Signed)
 [facility]  ONCOLOGY CLINIC PROGRESS NOTE   Patient Care Team: [name], MD as PCP - General (Internal Medicine) West Bali, MD (Inactive) as Consulting Physician (Gastroenterology) Lonie Peak, MD as Attending Physician (Radiation Oncology) Malmfelt, Lise Auer, RN as Oncology Nurse Navigator Meryl Crutch, MD as Consulting Physician (Oncology) Karle Barr, MD as Referring Physician (Otolaryngology)  PATIENT NAME: [name]   MR#: [id number] DOB: 16-Aug-1970  Date of visit: 06/04/2023   ASSESSMENT & PLAN:   AADI BORDNER is a 53 y.o. 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   Malignant neoplasm of supraglottis (HCC) Supraglottic cancer with one suspicious 7 mm lymph node on PET scan, classified as T3, N1, M0, stage III, low volume disease.   Discussed his case in our tumor conference on 04/14/2023.  Based on imaging studies, it is classified as cT3 lesion.  cN1.  Hence plan is to proceed with concurrent chemoradiation.  We have discussed about role of cisplatin being a radiosensitizer in the treatment of head and neck cancer.  We have discussed about the curative intent of chemoradiation for this patient.     -He began radiation treatments from 04/14/2023.  Began chemotherapy with cisplatin from 04/19/2023.  He has been tolerating chemoradiation well without any noticeable side effects.  Recently he was hospitalized from 05/03/2023 until 05/07/2023 for cellulitis around the site of he G-tube insertion.  He was treated with IV antibiotics initially and later completed course of oral antibiotics.  We held chemotherapy that week.  He was hospitalized again from 05/21/2023 until 05/27/2023 at [facility] for pneumonia and sepsis.  Currently on antibiotics again.  We held chemotherapy  this week and he also missed his radiation treatments.  He resumed cycle 6 of chemotherapy on 05/31/2023.  We dose reduced cisplatin to 30 mg/m dose and he also received Zarxio for 2 days as he had leukopenia.   Labs today reveal normal white count of 5200 with ANC of 4200.  Hemoglobin 11.  Platelet count normal at 299,000.  BMP unremarkable.  He will proceed with cycle 7 of cisplatin at 30 mg/m dose on 06/08/2023 as scheduled.  This will complete planned course of chemotherapy.  He is scheduled to complete radiation on 06/08/2023 as well.  -He has prescriptions available for nausea medicines.  Emphasized adequate hydration.  Patient verbalized understanding.  Going forward we will continue surveillance as per NCCN guidelines.  I will see him approximately in 3 weeks for routine follow-up and repeat labs.  Internal jugular (IJ) vein thromboembolism, acute, right (/Acute DVT of Right IJV- acute occlusive thrombus throughout the right internal jugular vein. Found during recent hospitalization.  He is currently on Eliquis 5 mg p.o. twice daily.  Plan to continue anticoagulation at least for 3 months followed by reevaluation.   I reviewed lab results and outside records for this visit and discussed relevant results with the patient. Diagnosis, plan of care and treatment options were also discussed in detail with the patient. Opportunity provided to ask questions and answers provided to his apparent satisfaction. Provided instructions to call our clinic with any problems, questions or concerns prior to return visit. I recommended to continue follow-up with PCP and sub-specialists. He verbalized understanding and agreed with the plan.   NCCN guidelines have been consulted in the planning of  this patient's care.  I spent a total of 30 minutes during this encounter with the patient including review of chart and various tests results, discussions about plan of care and coordination of care  plan.   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   Current Treatment: Concurrent chemoradiation with weekly cisplatin.  Radiation started from 04/14/2023.  Cisplatin started from 04/19/2023.  INTERVAL HISTORY:    Discussed the use of AI scribe software for clinical note transcription with the patient, who gave verbal consent to proceed.   [name] is here today for repeat clinical assessment.   He is in the final week of radiation treatment. He reports no recent hospitalizations and overall, he is doing well despite some 'hiccups along the way.'  The patient is currently using a feeding tube for nutrition, as he is only able to consume very little by mouth. However, he is able to drink water.  I have reviewed the past medical history, past surgical history, social history and family history with the patient and they are unchanged from previous note.  HISTORY OF PRESENT ILLNESS:   ONCOLOGY HISTORY:   He initially presented to the [facility] on 10/28/22 with c/o left ear pain, popping with chewing, and pain radiating down the side of his neck and throat x 1 month. He was treated for a left ear infection with a course of prednisone.   His symptoms including left ear and throat pain however persisted and progressed and he was subsequently referred to Dr. Suszanne Conners on 03/10/23 at [facility] for further evaluation. During which time, the patient detailed that his pain first began in the ear and eventually progressed to involve the left side of his throat.  A laryngoscopy was subsequently performed at that time which revealed an ulcerative  lesion on the posterior aspect of the epiglottis, superior to the vocal cords. The arytenoid mucosa was also mildly edematous and the true vocal folds were pale yellow, mobile, and without mass or lesion.       He accordingly underwent a direct laryngoscopy with biopsies of the left supraglottic tumor/mass on 03/16/23. Pathology showed findings consistent with invasive and in situ moderately differentiated squamous cell carcinoma. Per note by Dr Suszanne Conners, "Examination of the vallecula, piriform sinuses, and the pharyngeal mucosa were all normal. A fungating mass was noted on the posterior aspect of the left epiglottis. The mass did not appear to involve the vocal cords."   Soft tissue neck CT and PET scan on 03/23/23 were done - demonstrating a left supraglottic lesion which may involve the glottis as well as a 7 mm left level 3 lymph node that is suspicious for metastatic involvement.  No distant metastatic disease.  cT3,cN1,cM0, Stage III disease.   Plan made to proceed with concurrent chemoradiation with weekly cisplatin.  Oncology History  Malignant neoplasm of supraglottis (HCC)  03/22/2023 Initial Diagnosis   Malignant neoplasm of supraglottis (HCC)   03/26/2023 Cancer Staging   Staging form: Larynx - Supraglottis, AJCC 8th Edition - Clinical stage from 03/26/2023: Stage III (cT3, cN1, cM0) - Signed by Lonie Peak, MD on 04/14/2023 Stage prefix: Initial diagnosis  04/19/2023 -  Chemotherapy   Patient is on Treatment Plan : HEAD/NECK Cisplatin (40) q7d         REVIEW OF SYSTEMS:   Review of Systems - Oncology  All other pertinent systems were reviewed with the patient and are negative.  ALLERGIES: He has no known allergies.  MEDICATIONS:  Current Outpatient Medications  Medication Sig Dispense Refill   acetaminophen (TYLENOL) 500 MG tablet Take 1,000 mg by mouth every 6 (six) hours as needed for mild pain (pain score 1-3) or moderate pain (pain score 4-6).     apixaban  (ELIQUIS) 5 MG TABS tablet Take 2 tablets (10 mg total) by mouth 2 (two) times daily for 7 days, THEN 1 tablet (5 mg total) 2 (two) times daily. 388 tablet 0   dexamethasone (DECADRON) 4 MG tablet Take 2 tablets (8 mg) by mouth daily x 3 days starting the day after cisplatin chemotherapy. Take with food. 30 tablet 1   famotidine (PEPCID) 40 MG tablet Take 1 tablet (40 mg total) by mouth at bedtime.     FLUoxetine (PROZAC) 20 MG capsule Take 1 capsule (20 mg total) by mouth daily. 30 capsule 3   guaiFENesin-dextromethorphan (ROBITUSSIN DM) 100-10 MG/5ML syrup Place 15 mLs into feeding tube every 6 (six) hours. 118 mL 0   hydrOXYzine (ATARAX) 25 MG tablet Take 1 tablet (25 mg total) by mouth 3 (three) times daily as needed for anxiety. (Patient taking differently: Take 25 mg by mouth at bedtime.) 90 tablet 1   lidocaine (XYLOCAINE) 2 % solution Patient: Mix 1part 2% viscous lidocaine, 1part H20. Swallow 10mL of diluted mixture, before meals and at bedtime, up to QID 200 mL 3   lidocaine-prilocaine (EMLA) cream Apply to affected area once 30 g 3   metoprolol tartrate (LOPRESSOR) 25 MG tablet Take 0.5 tablets (12.5 mg total) by mouth 2 (two) times daily. 60 tablet 1   Nutritional Supplements (NUTREN 1.5) LIQD 27ml/hr for 18 hours/day (start at 3pm and stop feeding at 9am).  Flush of water at 3pm, 7pm, 11pm and 9am.     ondansetron (ZOFRAN) 8 MG tablet Take 1 tablet (8 mg total) by mouth every 8 (eight) hours as needed for nausea or vomiting. Start on the third day after cisplatin. 30 tablet 1   oxyCODONE 10 MG TABS Take 1 tablet (10 mg total) by mouth every 6 (six) hours as needed for severe pain (pain score 7-10). 60 tablet 0   pantoprazole (PROTONIX) 40 MG tablet Take 1 tablet (40 mg total) by mouth 2 (two) times daily. 60 tablet 2   prochlorperazine (COMPAZINE) 10 MG tablet TAKE 1 TABLET(10 MG) BY MOUTH EVERY 6 HOURS AS NEEDED FOR NAUSEA OR VOMITING 30 tablet 1   Protein (FEEDING SUPPLEMENT,  PROSOURCE TF20,) liquid Place 60 mLs into feeding tube daily. 1800 mL 0   QUEtiapine (SEROQUEL) 100 MG tablet Take 1 tablet (100 mg total) by mouth at bedtime. 30 tablet 1   sucralfate (CARAFATE) 1 GM/10ML suspension Place 10 mLs (1 g total) into feeding tube in the morning, at noon, and at bedtime for 20 days. 600 mL 0   No current facility-administered medications for this visit.     VITALS:   Blood pressure 121/80, pulse (!) 55, temperature (!) 97.4 F (36.3 C), temperature source Temporal, resp. rate 16, weight 134 lb 1.6 oz (60.8 kg), SpO2 97%.  Wt Readings from Last 3 Encounters:  06/04/23 134 lb 1.6 oz (60.8 kg)  05/31/23 139  lb (63 kg)  05/28/23 139 lb 12.8 oz (63.4 kg)    Body mass index is 23.02 kg/m.   Onc Performance Status - 06/04/23 1141       ECOG Perf Status   ECOG Perf Status Fully active, able to carry on all pre-disease performance without restriction      KPS SCALE   KPS % SCORE Normal activity with effort, some s/s of disease              PHYSICAL EXAM:   Physical Exam Constitutional:      General: He is not in acute distress.    Appearance: Normal appearance.  HENT:     Head: Normocephalic and atraumatic.     Mouth/Throat:     Comments: Mild erythema in the back of the throat. Eyes:     General: No scleral icterus.    Conjunctiva/sclera: Conjunctivae normal.  Neck:     Comments: Skin desquamation on the anterior aspect Cardiovascular:     Rate and Rhythm: Normal rate and regular rhythm.     Heart sounds: Normal heart sounds.  Pulmonary:     Effort: Pulmonary effort is normal.     Breath sounds: Normal breath sounds.  Chest:     Comments: Port-A-Cath in place without any signs of infection Abdominal:     General: There is no distension.     Comments: Feeding tube in place, bandaged.  Cellulitis has resolved.  Musculoskeletal:     Right lower leg: No edema.     Left lower leg: No edema.  Neurological:     General: No focal deficit  present.     Mental Status: He is alert and oriented to person, place, and time.  Psychiatric:        Mood and Affect: Mood normal.        Behavior: Behavior normal.        Thought Content: Thought content normal.       LABORATORY DATA:   I have reviewed the data as listed.  Results for orders placed or performed in visit on 06/04/23  Rad Onc [facility]ria Session Summary  Result Value Ref Range   Course ID [id number]    Course Start Date 04/05/2023    Session Number 33    Course First Treatment Date 04/14/2023  9:27 AM    Course Last Treatment Date 06/04/2023 12:48 PM    Course Elapsed Days 51    Reference Point ID HN    Reference Point Dosage Given to Date 65.9999999999999 Gy   Reference Point Session Dosage Given 2 Gy   Plan ID HN    Plan Fractions Treated to Date 33    Plan Total Fractions Prescribed 35    Plan Prescribed Dose Per Fraction 2 Gy   Plan Total Prescribed Dose 70.000000 Gy   Plan Primary Reference Point HN   Results for orders placed or performed in visit on 06/04/23  Magnesium  Result Value Ref Range   Magnesium 1.9 1.7 - 2.4 mg/dL  Basic Metabolic Panel - Cancer Center Only  Result Value Ref Range   Sodium 137 135 - 145 mmol/L   Potassium 4.2 3.5 - 5.1 mmol/L   Chloride 101 98 - 111 mmol/L   CO2 30 22 - 32 mmol/L   Glucose, Bld 82 70 - 99 mg/dL   BUN 21 (H) 6 - 20 mg/dL   Creatinine 4.03 4.74 - 1.24 mg/dL   Calcium 9.2 8.9 - 25.9 mg/dL   GFR,  Estimated >60 >60 mL/min   Anion gap 6 5 - 15  CBC with Differential (Cancer Center Only)  Result Value Ref Range   WBC Count 5.2 4.0 - 10.5 K/uL   RBC 3.69 (L) 4.22 - 5.81 MIL/uL   Hemoglobin 11.0 (L) 13.0 - 17.0 g/dL   HCT 40.9 (L) 81.1 - 91.4 %   MCV 89.7 80.0 - 100.0 fL   MCH 29.8 26.0 - 34.0 pg   MCHC 33.2 30.0 - 36.0 g/dL   RDW 78.2 95.6 - 21.3 %   Platelet Count 299 150 - 400 K/uL   nRBC 0.0 0.0 - 0.2 %   Neutrophils Relative % 67 %   Neutro Abs 4.2 1.7 - 7.7 K/uL   Band Neutrophils 14 %   Lymphocytes  Relative 12 %   Lymphs Abs 0.6 (L) 0.7 - 4.0 K/uL   Monocytes Relative 7 %   Monocytes Absolute 0.4 0.1 - 1.0 K/uL   Eosinophils Relative 0 %   Eosinophils Absolute 0.0 0.0 - 0.5 K/uL   Basophils Relative 0 %   Basophils Absolute 0.0 0.0 - 0.1 K/uL   WBC Morphology DOHLE BODIES    RBC Morphology MORPHOLOGY UNREMARKABLE    Smear Review LARGE PLTS RARE NRBC    Abs Immature Granulocytes 0.00 0.00 - 0.07 K/uL     RADIOGRAPHIC STUDIES:  I have personally reviewed the radiological images as listed and agree with the findings in the report.  ECHOCARDIOGRAM COMPLETE Result Date: 05/22/2023    ECHOCARDIOGRAM REPORT   Patient Name:   HASTON CASEBOLT Date of Exam: 05/22/2023 Medical Rec #:  [id number]      Height:       64.0 in Accession #:    [id number]     Weight:       139.0 lb Date of Birth:  20-Jun-1970     BSA:          1.676 m Patient Age:    52 years       BP:           136/85 mmHg Patient Gender: M              HR:           72 bpm. [facility] Procedure: 2D Echo, 3D Echo, Cardiac Doppler, Color Doppler and Strain Analysis            (Both Spectral and Color Flow Doppler were utilized during            procedure). Indications:    Syncope  History:        Patient has prior history of Echocardiogram examinations, most                 recent 12/24/2016. Risk Factors:Hypertension and Former Smoker.  Sonographer:    Karma Ganja Referring Phys: [id number] COURAGE EMOKPAE  Sonographer Comments: Global longitudinal strain was attempted. IMPRESSIONS  1. Left ventricular ejection fraction, by estimation, is 60 to 65%. The left ventricle has normal function. The left ventricle has no regional wall motion abnormalities. Left ventricular diastolic parameters were normal. The average left ventricular global longitudinal strain is -18.2 %. The global longitudinal strain is normal.  2. Right ventricular systolic function is normal. The right ventricular size is normal.  3. The mitral valve is normal in  structure. No evidence of mitral valve regurgitation. No evidence of mitral stenosis.  4. The aortic valve is normal in structure. Aortic valve regurgitation is  not visualized. No aortic stenosis is present.  5. The inferior vena cava is normal in size with greater than 50% respiratory variability, suggesting right atrial pressure of 3 mmHg. FINDINGS  Left Ventricle: Left ventricular ejection fraction, by estimation, is 60 to 65%. The left ventricle has normal function. The left ventricle has no regional wall motion abnormalities. The average left ventricular global longitudinal strain is -18.2 %. Strain was performed and the global longitudinal strain is normal. 3D ejection fraction reviewed and evaluated as part of the interpretation. Alternate measurement of EF is felt to be most reflective of LV function. The left ventricular internal cavity size was normal in size. There is no left ventricular hypertrophy. Left ventricular diastolic parameters were normal. Right Ventricle: The right ventricular size is normal. No increase in right ventricular wall thickness. Right ventricular systolic function is normal. Left Atrium: Left atrial size was normal in size. Right Atrium: Right atrial size was normal in size. Pericardium: There is no evidence of pericardial effusion. Mitral Valve: The mitral valve is normal in structure. No evidence of mitral valve regurgitation. No evidence of mitral valve stenosis. Tricuspid Valve: The tricuspid valve is normal in structure. Tricuspid valve regurgitation is not demonstrated. No evidence of tricuspid stenosis. Aortic Valve: The aortic valve is normal in structure. Aortic valve regurgitation is not visualized. No aortic stenosis is present. Aortic valve mean gradient measures 6.0 mmHg. Aortic valve peak gradient measures 11.0 mmHg. Aortic valve area, by VTI measures 2.14 cm. Pulmonic Valve: The pulmonic valve was not well visualized. Aorta: The aortic root is normal in size and  structure. Venous: The inferior vena cava is normal in size with greater than 50% respiratory variability, suggesting right atrial pressure of 3 mmHg. IAS/Shunts: No atrial level shunt detected by color flow Doppler. Additional Comments: 3D was performed not requiring image post processing on an independent workstation and was indeterminate.  LEFT VENTRICLE PLAX 2D LVIDd:         4.30 cm   Diastology LVIDs:         2.90 cm   LV e' medial:    9.79 cm/s LV PW:         0.80 cm   LV E/e' medial:  9.0 LV IVS:        0.90 cm   LV e' lateral:   15.80 cm/s LVOT diam:     1.90 cm   LV E/e' lateral: 5.6 LV SV:         62 LV SV Index:   37        2D Longitudinal Strain LVOT Area:     2.84 cm  2D Strain GLS Avg:     -18.2 %                           3D Volume EF:                          3D EF:        52 %                          LV EDV:       182 ml                          LV ESV:       87 ml  LV SV:        95 ml RIGHT VENTRICLE RV Basal diam:  4.10 cm RV S prime:     17.60 cm/s TAPSE (M-mode): 2.6 cm LEFT ATRIUM             Index        RIGHT ATRIUM           Index LA diam:        2.90 cm 1.73 cm/m   RA Area:     10.20 cm LA Vol (A2C):   65.5 ml 39.08 ml/m  RA Volume:   19.20 ml  11.46 ml/m LA Vol (A4C):   28.2 ml 16.82 ml/m LA Biplane Vol: 43.0 ml 25.66 ml/m  AORTIC VALVE AV Area (Vmax):    2.03 cm AV Area (Vmean):   2.10 cm AV Area (VTI):     2.14 cm AV Vmax:           166.00 cm/s AV Vmean:          106.000 cm/s AV VTI:            0.290 m AV Peak Grad:      11.0 mmHg AV Mean Grad:      6.0 mmHg LVOT Vmax:         119.00 cm/s LVOT Vmean:        78.400 cm/s LVOT VTI:          0.219 m LVOT/AV VTI ratio: 0.76  AORTA Ao Root diam: 3.10 cm MITRAL VALVE MV Area (PHT): 4.06 cm    SHUNTS MV Decel Time: 187 msec    Systemic VTI:  0.22 m MV E velocity: 87.80 cm/s  Systemic Diam: 1.90 cm MV A velocity: 75.00 cm/s MV E/A ratio:  1.17 Mihai Croitoru MD Electronically signed by Thurmon Fair MD  Signature Date/Time: 05/22/2023/12:49:20 PM    Final    US Carotid Bilateral Result Date: 05/21/2023 CLINICAL DATA:  Syncope EXAM: BILATERAL CAROTID DUPLEX ULTRASOUND TECHNIQUE: Wallace Cullens scale imaging, color Doppler and duplex ultrasound were performed of bilateral carotid and vertebral arteries in the neck. COMPARISON:  None Available. FINDINGS: Criteria: Quantification of carotid stenosis is based on velocity parameters that correlate the residual internal carotid diameter with NASCET-based stenosis levels, using the diameter of the distal internal carotid lumen as the denominator for stenosis measurement. The following velocity measurements were obtained: RIGHT ICA: 114/35 cm/sec CCA: 118/16 cm/sec SYSTOLIC ICA/CCA RATIO:  1.3 ECA:  68 cm/sec LEFT ICA: 92/29 cm/sec CCA: 106/26 cm/sec SYSTOLIC ICA/CCA RATIO:  1.0 ECA:  84 cm/sec RIGHT CAROTID ARTERY: Mild heterogeneous atherosclerotic plaque in the proximal internal carotid artery. By peak systolic velocity criteria, the estimated stenosis is less than 50%. RIGHT VERTEBRAL ARTERY:  Patent with normal antegrade flow. LEFT CAROTID ARTERY: Mild heterogeneous atherosclerotic plaque in the proximal internal carotid artery. By peak systolic velocity criteria, the estimated stenosis is less than 50%. LEFT VERTEBRAL ARTERY:  Patent with normal antegrade flow. Other: Incidental note is made of thrombus within the right internal jugular vein. The vein is noncompressible in there is no evidence of color flow. Findings are consistent with acute occlusive thrombus. IMPRESSION: 1. Incidental note is made of acute occlusive thrombus throughout the right internal jugular vein. 2. Mild (1-49%) stenosis proximal right internal carotid artery secondary to heterogenous atherosclerotic plaque. 3. Mild (1-49%) stenosis proximal left internal carotid artery secondary to heterogenous atherosclerotic plaque. 4. Vertebral arteries are patent with normal antegrade flow. Electronically Signed    By: Vilma Prader  Archer Asa M.D.   On: 05/21/2023 13:55   CT Angio Chest Pulmonary Embolism (PE) W or WO Contrast Result Date: 05/21/2023 CLINICAL DATA:  History of laryngeal cancer with syncopal episode, shortness of breath, and weakness. Altered mental status EXAM: CT ANGIOGRAPHY CHEST WITH CONTRAST TECHNIQUE: Multidetector CT imaging of the chest was performed using the standard protocol during bolus administration of intravenous contrast. Multiplanar CT image reconstructions and MIPs were obtained to evaluate the vascular anatomy. RADIATION DOSE REDUCTION: This exam was performed according to the departmental dose-optimization program which includes automated exposure control, adjustment of the mA and/or kV according to patient size and/or use of iterative reconstruction technique. CONTRAST:  75mL OMNIPAQUE IOHEXOL 350 MG/ML SOLN COMPARISON:  Same day chest radiograph FINDINGS: Cardiovascular: Right chest wall port terminates in the right atrium. The study is high quality for the evaluation of pulmonary embolism. There are no filling defects in the central, lobar, segmental or subsegmental pulmonary artery branches to suggest acute pulmonary embolism. Great vessels are normal in course and caliber. Normal heart size. No significant pericardial fluid/thickening. Coronary artery calcifications and aortic atherosclerosis. Mediastinum/Nodes: Imaged thyroid gland without nodules meeting criteria for imaging follow-up by size. Normal esophagus. No pathologically enlarged axillary, supraclavicular, mediastinal, or hilar lymph nodes. Lungs/Pleura: The central airways are patent. Layering secretions within the trachea extending into bilateral bronchi with left lower lobe subsegmental mucous plugging. Mild diffuse bronchial wall thickening. Minimal right apical paraseptal emphysema. Medial bilateral lower lobe tree-in-bud and ground-glass nodules. No pneumothorax. No pleural effusion. Upper abdomen: Partially imaged  percutaneous gastrostomy tube in-situ. Musculoskeletal: No acute or abnormal lytic or blastic osseous lesions. Review of the MIP images confirms the above findings. IMPRESSION: 1. No evidence of pulmonary embolism. 2. Findings of bronchitis with medial bilateral lower lobe tree-in-bud and ground-glass nodules, likely infectious/inflammatory. 3. Aortic Atherosclerosis (ICD10-I70.0) and Emphysema (ICD10-J43.9). Coronary artery calcifications. Assessment for potential risk factor modification, dietary therapy or pharmacologic therapy may be warranted, if clinically indicated. Electronically Signed   By: Agustin Cree M.D.   On: 05/21/2023 12:01   US Venous Img Lower Bilateral (DVT) Result Date: 05/21/2023 CLINICAL DATA:  Pulmonary emboli (HCC) EXAM: BILATERAL LOWER EXTREMITY VENOUS DOPPLER ULTRASOUND TECHNIQUE: Gray-scale sonography with graded compression, as well as color Doppler and duplex ultrasound were performed to evaluate the lower extremity deep venous systems from the level of the common femoral vein and including the common femoral, femoral, profunda femoral, popliteal and calf veins including the posterior tibial, peroneal and gastrocnemius veins when visible. The superficial great saphenous vein was also interrogated. Spectral Doppler was utilized to evaluate flow at rest and with distal augmentation maneuvers in the common femoral, femoral and popliteal veins. COMPARISON:  None Available. FINDINGS: RIGHT LOWER EXTREMITY Common Femoral Vein: No evidence of thrombus. Normal compressibility, respiratory phasicity and response to augmentation. Saphenofemoral Junction: No evidence of thrombus. Normal compressibility and flow on color Doppler imaging. Profunda Femoral Vein: No evidence of thrombus. Normal compressibility and flow on color Doppler imaging. Femoral Vein: No evidence of thrombus. Normal compressibility, respiratory phasicity and response to augmentation. Popliteal Vein: No evidence of thrombus.  Normal compressibility, respiratory phasicity and response to augmentation. Calf Veins: No evidence of thrombus. Normal compressibility and flow on color Doppler imaging. LEFT LOWER EXTREMITY Common Femoral Vein: No evidence of thrombus. Normal compressibility, respiratory phasicity and response to augmentation. Saphenofemoral Junction: No evidence of thrombus. Normal compressibility and flow on color Doppler imaging. Profunda Femoral Vein: No evidence of thrombus. Normal compressibility and flow on color Doppler imaging.  Femoral Vein: No evidence of thrombus. Normal compressibility, respiratory phasicity and response to augmentation. Popliteal Vein: No evidence of thrombus. Normal compressibility, respiratory phasicity and response to augmentation. Calf Veins: No evidence of thrombus. Normal compressibility and flow on color Doppler imaging. IMPRESSION: No evidence of deep venous thrombosis in either lower extremity. Electronically Signed   By: Feliberto Harts M.D.   On: 05/21/2023 10:42   DG Chest Port 1 View Result Date: 05/21/2023 CLINICAL DATA:  weakness EXAM: PORTABLE CHEST 1 VIEW COMPARISON:  Chest x-ray 05/03/2023 FINDINGS: The heart and mediastinal contours are unchanged. Right chest wall accessed Port-A-Cath with tip overlying the expected region the superior caval junction. Atherosclerotic plaque. No focal consolidation. No pulmonary edema. No pleural effusion. No pneumothorax. No acute osseous abnormality. IMPRESSION: 1. No active disease. 2.  Aortic Atherosclerosis (ICD10-I70.0). Electronically Signed   By: Tish Frederickson M.D.   On: 05/21/2023 01:59     CODE STATUS:  Code Status History     Date Active Date Inactive Code Status Order ID Comments User Context   06/23/2017 1540 06/28/2017 2102 Full Code [id number]  Laveda Abbe, NP Inpatient   06/23/2017 0327 06/23/2017 1502 Full Code [id number]  Greg[facility]   01/13/2017 0858 01/13/2017 1329 Full Code [id number]  Lucretia Roers, MD Inpatient   11/05/2016 0018 11/10/2016 1937 Full Code [id number]  Kerry Hough, PA-C Inpatient       Orders Placed This Encounter  Procedures   CMP (Cancer Center only)    Standing Status:   Future    Expected Date:   06/25/2023    Expiration Date:   06/03/2024   CBC with Differential (Cancer Center Only)    Standing Status:   Future    Expected Date:   06/25/2023    Expiration Date:   06/03/2024   Magnesium    Standing Status:   Future    Expected Date:   06/25/2023    Expiration Date:   06/03/2024     Future Appointments  Date Time Provider Department Center  06/07/2023 11:45 AM [facility] [id number] [facility] None  06/08/2023  7:30 AM [facility] INFUSION [facility] None  06/08/2023  1:45 PM [facility] LINAC 3 [facility] None  06/08/2023  4:30 PM Elsie Lincoln, MD [facility] None  06/11/2023  1:00 PM [facility] SPEECH THERAPIST [facility] [place]  06/11/2023  1:00 PM WL-DG R/F 1 [facility]-DG   06/22/2023 11:40 AM Erven Colla, PA-C [facility] None  06/22/2023  2:00 PM Barron Alvine, CCC-SLP [facility]  06/22/2023  3:00 PM Jeanella Craze, Blaire L, PT [facility] None  06/25/2023  2:00 PM [facility] LAB [facility] None  06/25/2023  2:30 PM Adriene Knipfer, MD [facility] None  08/04/2023  1:40 PM [name], MD [facility]      This document was completed utilizing speech recognition software. Grammatical errors, random word insertions, pronoun errors, and incomplete sentences are an occasional consequence of this system due to software limitations, ambient noise, and hardware issues. Any formal questions or concerns about the content, text or information contained within the body of this dictation should be directly addressed to the provider for clarification.

## 2023-06-04 NOTE — Progress Notes (Signed)
 Patient seen by Dr. [name] today  Vitals are within treatment parameters:Yes   Labs are within treatment parameters: Yes   Treatment plan has been signed: Yes   Per physician team, Patient is ready for treatment and there are NO modifications to the treatment plan.

## 2023-06-07 ENCOUNTER — Ambulatory Visit
Admission: RE | Admit: 2023-06-07 | Discharge: 2023-06-07 | Disposition: A | Discharge status: Home / Self Care | Payer: Medicare HMO | Source: Ambulatory Visit | Attending: Radiation Oncology

## 2023-06-07 ENCOUNTER — Telehealth: Payer: Self-pay | Admitting: Oncology

## 2023-06-07 ENCOUNTER — Other Ambulatory Visit: Payer: Self-pay

## 2023-06-07 ENCOUNTER — Inpatient Hospital Stay

## 2023-06-07 DIAGNOSIS — Z7901 Long term (current) use of anticoagulants: Secondary | ICD-10-CM | POA: Diagnosis not present

## 2023-06-07 DIAGNOSIS — K219 Gastro-esophageal reflux disease without esophagitis: Secondary | ICD-10-CM | POA: Diagnosis not present

## 2023-06-07 DIAGNOSIS — Z923 Personal history of irradiation: Secondary | ICD-10-CM | POA: Diagnosis not present

## 2023-06-07 DIAGNOSIS — I700 Atherosclerosis of aorta: Secondary | ICD-10-CM | POA: Diagnosis not present

## 2023-06-07 DIAGNOSIS — Z510 Encounter for antineoplastic radiation therapy: Secondary | ICD-10-CM | POA: Diagnosis not present

## 2023-06-07 DIAGNOSIS — I82C11 Acute embolism and thrombosis of right internal jugular vein: Secondary | ICD-10-CM | POA: Diagnosis not present

## 2023-06-07 DIAGNOSIS — D72819 Decreased white blood cell count, unspecified: Secondary | ICD-10-CM | POA: Diagnosis not present

## 2023-06-07 DIAGNOSIS — Z9221 Personal history of antineoplastic chemotherapy: Secondary | ICD-10-CM | POA: Diagnosis not present

## 2023-06-07 DIAGNOSIS — C321 Malignant neoplasm of supraglottis: Secondary | ICD-10-CM | POA: Diagnosis not present

## 2023-06-07 DIAGNOSIS — R110 Nausea: Secondary | ICD-10-CM | POA: Diagnosis not present

## 2023-06-07 LAB — RAD ONC ARIA SESSION SUMMARY
Course Elapsed Days: 54
Plan Fractions Treated to Date: 34
Plan Prescribed Dose Per Fraction: 2 Gy
Plan Total Fractions Prescribed: 35
Plan Total Prescribed Dose: 70 Gy
Reference Point Dosage Given to Date: 68 Gy
Reference Point Session Dosage Given: 2 Gy
Session Number: 34

## 2023-06-07 MED FILL — Fosaprepitant Dimeglumine For IV Infusion 150 MG (Base Eq): INTRAVENOUS | Qty: 5 | Status: AC

## 2023-06-07 NOTE — Telephone Encounter (Signed)
 Charge Nurse on duty moved patients appt to 9am instead of 7:30 am due to a nurse meeting. I called the patient and [name] is aware of time change details.

## 2023-06-08 ENCOUNTER — Ambulatory Visit
Admission: RE | Admit: 2023-06-08 | Discharge: 2023-06-08 | Disposition: A | Discharge status: Home / Self Care | Payer: Medicare HMO | Source: Ambulatory Visit | Attending: Radiation Oncology | Admitting: Radiation Oncology

## 2023-06-08 ENCOUNTER — Other Ambulatory Visit: Payer: Self-pay

## 2023-06-08 ENCOUNTER — Inpatient Hospital Stay: Admitting: Dietician

## 2023-06-08 ENCOUNTER — Telehealth (HOSPITAL_COMMUNITY): Payer: Self-pay | Admitting: Psychiatry

## 2023-06-08 ENCOUNTER — Ambulatory Visit: Payer: Medicare HMO

## 2023-06-08 ENCOUNTER — Inpatient Hospital Stay

## 2023-06-08 VITALS — BP 124/77 | HR 66 | Temp 99.6°F | Resp 16 | Wt 140.8 lb

## 2023-06-08 DIAGNOSIS — Z7901 Long term (current) use of anticoagulants: Secondary | ICD-10-CM | POA: Diagnosis not present

## 2023-06-08 DIAGNOSIS — R110 Nausea: Secondary | ICD-10-CM | POA: Diagnosis not present

## 2023-06-08 DIAGNOSIS — Z510 Encounter for antineoplastic radiation therapy: Secondary | ICD-10-CM | POA: Diagnosis not present

## 2023-06-08 DIAGNOSIS — Z923 Personal history of irradiation: Secondary | ICD-10-CM | POA: Diagnosis not present

## 2023-06-08 DIAGNOSIS — C321 Malignant neoplasm of supraglottis: Secondary | ICD-10-CM | POA: Diagnosis not present

## 2023-06-08 DIAGNOSIS — K219 Gastro-esophageal reflux disease without esophagitis: Secondary | ICD-10-CM | POA: Diagnosis not present

## 2023-06-08 DIAGNOSIS — I700 Atherosclerosis of aorta: Secondary | ICD-10-CM | POA: Diagnosis not present

## 2023-06-08 DIAGNOSIS — D72819 Decreased white blood cell count, unspecified: Secondary | ICD-10-CM | POA: Diagnosis not present

## 2023-06-08 DIAGNOSIS — I82C11 Acute embolism and thrombosis of right internal jugular vein: Secondary | ICD-10-CM | POA: Diagnosis not present

## 2023-06-08 DIAGNOSIS — Z9221 Personal history of antineoplastic chemotherapy: Secondary | ICD-10-CM | POA: Diagnosis not present

## 2023-06-08 LAB — RAD ONC ARIA SESSION SUMMARY
Course Elapsed Days: 55
Plan Fractions Treated to Date: 35
Plan Prescribed Dose Per Fraction: 2 Gy
Plan Total Fractions Prescribed: 35
Plan Total Prescribed Dose: 70 Gy
Reference Point Dosage Given to Date: 70 Gy
Reference Point Session Dosage Given: 2 Gy
Session Number: 35

## 2023-06-08 MED ORDER — POTASSIUM CHLORIDE IN NACL 20-0.9 MEQ/L-% IV SOLN
Freq: Once | INTRAVENOUS | Status: AC
  Filled 2023-06-08: qty 1000

## 2023-06-08 MED ORDER — SODIUM CHLORIDE 0.9 % IV SOLN: INTRAVENOUS | Status: DC

## 2023-06-08 MED ORDER — SODIUM CHLORIDE 0.9% FLUSH: 10.0000 mL | INTRAVENOUS | Status: DC | PRN

## 2023-06-08 MED ORDER — SODIUM CHLORIDE 0.9 % IV SOLN
30.0000 mg/m2 | Freq: Once | INTRAVENOUS | Status: AC
  Administered 2023-06-08: 50 mg via INTRAVENOUS
  Filled 2023-06-08: qty 50

## 2023-06-08 MED ORDER — DEXAMETHASONE SODIUM PHOSPHATE 10 MG/ML IJ SOLN
10.0000 mg | Freq: Once | INTRAMUSCULAR | Status: AC
  Administered 2023-06-08: 10 mg via INTRAVENOUS
  Filled 2023-06-08: qty 1

## 2023-06-08 MED ORDER — SODIUM CHLORIDE 0.9 % IV SOLN
150.0000 mg | Freq: Once | INTRAVENOUS | Status: AC
  Administered 2023-06-08: 150 mg via INTRAVENOUS
  Filled 2023-06-08: qty 150
  Filled 2023-06-08: qty 5

## 2023-06-08 MED ORDER — HEPARIN SOD (PORK) LOCK FLUSH 100 UNIT/ML IV SOLN: 500.0000 [IU] | Freq: Once | INTRAVENOUS | Status: DC | PRN

## 2023-06-08 MED ORDER — MAGNESIUM SULFATE 2 GM/50ML IV SOLN
2.0000 g | Freq: Once | INTRAVENOUS | Status: AC
  Administered 2023-06-08: 2 g via INTRAVENOUS
  Filled 2023-06-08: qty 50

## 2023-06-08 MED ORDER — PALONOSETRON HCL INJECTION 0.25 MG/5ML
0.2500 mg | Freq: Once | INTRAVENOUS | Status: AC
  Administered 2023-06-08: 0.25 mg via INTRAVENOUS
  Filled 2023-06-08: qty 5

## 2023-06-08 NOTE — Progress Notes (Signed)
 Nutrition Follow-up:  Patient with SCC of supraglottis, stage III. Patient receiving concurrent chemotherapy and radiation. Hospital admission 2/21-2/27 with acute DVT of IJV, CAP, sepsis, syncope, anemia.   Met with pt and wife in infusion. Today is his final chemoRT. Pt reports feeling increased fatigue today, but doing well overall. His throat is sore and having painful swallow. Pt reports tolerating water by mouth. He did eat a few peanut butter crackers this morning in infusion. The crackers were dry and scratched his throat. Patient is relying on tube for nutrition. He reports tolerating continuous feeds via pump @ 67ml/hr. Pt has not been consistent with recommended 18 hours/day. He is agreeable to increase by 10 ml/day to goal of 95 ml/hr. Patient denies nausea, vomiting, diarrhea, constipation.   Medications: reviewed  Labs: 3/7 labs reviewed   Anthropometrics: Wt 140 lb 12.8 oz today    Estimated Energy Needs  Kcals: 2100-2450 Protein: 105-122 Fluid: >2.1 L  NUTRITION DIAGNOSIS: Predicted suboptimal intake ongoing - addressing via TF   INTERVENTION:  Increase continuous feeds by 10 ml/hr as tolerated to goal of 95 ml/hr x 18hrs Flush with 240 ml water QID Oral intake as tolerated Congratulated pt on completing treatment     MONITORING, EVALUATION, GOAL: wt trends, intake, TF   NEXT VISIT: Tuesday March 25 in clinic

## 2023-06-08 NOTE — Progress Notes (Signed)
 Oncology Nurse Navigator Documentation   Met with [name] and his wife after final RT to offer support and to celebrate end of radiation treatment.   Provided verbal post-RT guidance: Importance of keeping all follow-up appts, especially those with Nutrition and SLP. Importance of protecting treatment area from sun. Continuation of Sonafine application 2-3 times daily, application of antibiotic ointment to areas of raw skin; when supply of Sonafine exhausted transition to OTC lotion with vitamin E.  Explained my role as navigator will continue for several more months, encouraged him to call me with needs/concerns.    Hedda Slade RN, BSN, OCN Head & Neck Oncology Nurse Navigator  [facility] Center at [facility] Phone # [phone]  Fax # [phone]

## 2023-06-08 NOTE — Patient Instructions (Signed)

## 2023-06-09 ENCOUNTER — Ambulatory Visit: Payer: Medicare HMO

## 2023-06-09 NOTE — Radiation Completion Notes (Signed)
 Patient Name: [name], [name] MRN: [id number] Date of Birth: 10-Oct-1970 Referring Physician: Illene Silver, M.D. Date of Service: 2023-06-09 Radiation Oncologist: Lonie Peak, M.D. [facility]                             RADIATION ONCOLOGY END OF TREATMENT NOTE     Diagnosis: C32.1 Malignant neoplasm of supraglottis Staging on 2023-03-26: Malignant neoplasm of supraglottis (HCC) T=cT3, N=cN1, M=cM0 Intent: Curative     ==========DELIVERED PLANS==========  First Treatment Date: 2023-04-14 Last Treatment Date: 2023-06-08   Plan Name: HN Site: Larynx Technique: IMRT Mode: Photon Dose Per Fraction: 2 Gy Prescribed Dose (Delivered / Prescribed): 70 Gy / 70 Gy Prescribed Fxs (Delivered / Prescribed): 35 / 35     ==========ON TREATMENT VISIT DATES========== 2023-04-16, 2023-04-27, 2023-05-03, 2023-05-10, 2023-05-17, 2023-05-31, 2023-06-04     ==========UPCOMING VISITS========== 08/04/2023 [facility] CARE OFFICE VISIT [name], MD  06/25/2023 [facility] ONCOLOGY EST PT 15 Pasam, Avinash, MD  06/25/2023 [facility] ONCOLOGY LAB [facility] LAB  06/22/2023 [facility] ONCOLOGY NUT 45 Noreene Larsson, [place]  06/22/2023 [facility] ONC FOLLOW UP 20 Erven Colla, [place]  06/22/2023 [facility] NEURO NEURO ST TREATMENT Nona Dell  06/22/2023 [facility] REH AT [employer], Blaire L, PT  06/11/2023 [facility]-DIAGNOSTIC RAD SWF MBS WL WL-DG R/F 1  06/11/2023 [facility]-ACUTE REHAB MBS WL [facility]-REHBL SPEECH THERAPIST        ==========APPENDIX - ON TREATMENT VISIT NOTES==========   See weekly On Treatment Notes in Epic for details in the Media tab (listed as Progress notes on the On Treatment Visit Dates listed above).

## 2023-06-11 ENCOUNTER — Telehealth: Payer: Self-pay

## 2023-06-11 ENCOUNTER — Ambulatory Visit (HOSPITAL_COMMUNITY): Admission: RE | Admit: 2023-06-11 | Payer: Medicare HMO | Source: Ambulatory Visit

## 2023-06-11 NOTE — Telephone Encounter (Signed)
 Returned call regarding concerns reporting that patient is miserable and has following symptoms: Migraine HA (no prior hx), producing phlegm with cough, gagging during tube feed, sleeping all the time, and bad throat pain. He recently  completed chemo and radiation tx. Shared with significant other that Dr. Arlana Pouch is aware and recommends that patient be seen in an ED for possible s/s of infection. [name] agreed.

## 2023-06-14 ENCOUNTER — Inpatient Hospital Stay (HOSPITAL_COMMUNITY)
Admission: EM | Admit: 2023-06-14 | Discharge: 2023-06-17 | DRG: 194 | Disposition: A | Discharge status: Home / Self Care | Attending: Family Medicine | Admitting: Family Medicine

## 2023-06-14 ENCOUNTER — Encounter (HOSPITAL_COMMUNITY): Payer: Self-pay

## 2023-06-14 ENCOUNTER — Emergency Department (HOSPITAL_COMMUNITY)

## 2023-06-14 ENCOUNTER — Other Ambulatory Visit: Payer: Self-pay

## 2023-06-14 DIAGNOSIS — Z8673 Personal history of transient ischemic attack (TIA), and cerebral infarction without residual deficits: Secondary | ICD-10-CM | POA: Diagnosis not present

## 2023-06-14 DIAGNOSIS — Z931 Gastrostomy status: Secondary | ICD-10-CM

## 2023-06-14 DIAGNOSIS — M79604 Pain in right leg: Secondary | ICD-10-CM | POA: Diagnosis present

## 2023-06-14 DIAGNOSIS — Z9221 Personal history of antineoplastic chemotherapy: Secondary | ICD-10-CM

## 2023-06-14 DIAGNOSIS — Z6823 Body mass index (BMI) 23.0-23.9, adult: Secondary | ICD-10-CM

## 2023-06-14 DIAGNOSIS — E440 Moderate protein-calorie malnutrition: Secondary | ICD-10-CM | POA: Diagnosis not present

## 2023-06-14 DIAGNOSIS — F1911 Other psychoactive substance abuse, in remission: Secondary | ICD-10-CM | POA: Diagnosis present

## 2023-06-14 DIAGNOSIS — J159 Unspecified bacterial pneumonia: Principal | ICD-10-CM | POA: Diagnosis present

## 2023-06-14 DIAGNOSIS — Y95 Nosocomial condition: Secondary | ICD-10-CM | POA: Diagnosis present

## 2023-06-14 DIAGNOSIS — F1411 Cocaine abuse, in remission: Secondary | ICD-10-CM | POA: Diagnosis present

## 2023-06-14 DIAGNOSIS — B182 Chronic viral hepatitis C: Secondary | ICD-10-CM | POA: Diagnosis not present

## 2023-06-14 DIAGNOSIS — I10 Essential (primary) hypertension: Secondary | ICD-10-CM | POA: Diagnosis not present

## 2023-06-14 DIAGNOSIS — F319 Bipolar disorder, unspecified: Secondary | ICD-10-CM | POA: Diagnosis not present

## 2023-06-14 DIAGNOSIS — R058 Other specified cough: Secondary | ICD-10-CM | POA: Diagnosis not present

## 2023-06-14 DIAGNOSIS — C321 Malignant neoplasm of supraglottis: Secondary | ICD-10-CM | POA: Diagnosis not present

## 2023-06-14 DIAGNOSIS — E7800 Pure hypercholesterolemia, unspecified: Secondary | ICD-10-CM | POA: Diagnosis present

## 2023-06-14 DIAGNOSIS — J189 Pneumonia, unspecified organism: Principal | ICD-10-CM | POA: Diagnosis present

## 2023-06-14 DIAGNOSIS — Z86718 Personal history of other venous thrombosis and embolism: Secondary | ICD-10-CM | POA: Diagnosis not present

## 2023-06-14 DIAGNOSIS — E43 Unspecified severe protein-calorie malnutrition: Secondary | ICD-10-CM | POA: Diagnosis not present

## 2023-06-14 DIAGNOSIS — F1011 Alcohol abuse, in remission: Secondary | ICD-10-CM | POA: Diagnosis not present

## 2023-06-14 DIAGNOSIS — K219 Gastro-esophageal reflux disease without esophagitis: Secondary | ICD-10-CM | POA: Diagnosis not present

## 2023-06-14 DIAGNOSIS — M79662 Pain in left lower leg: Secondary | ICD-10-CM | POA: Diagnosis not present

## 2023-06-14 DIAGNOSIS — Z800 Family history of malignant neoplasm of digestive organs: Secondary | ICD-10-CM

## 2023-06-14 DIAGNOSIS — R918 Other nonspecific abnormal finding of lung field: Secondary | ICD-10-CM | POA: Diagnosis not present

## 2023-06-14 DIAGNOSIS — Z8521 Personal history of malignant neoplasm of larynx: Secondary | ICD-10-CM

## 2023-06-14 DIAGNOSIS — Z7901 Long term (current) use of anticoagulants: Secondary | ICD-10-CM

## 2023-06-14 DIAGNOSIS — Z87891 Personal history of nicotine dependence: Secondary | ICD-10-CM

## 2023-06-14 DIAGNOSIS — Z8042 Family history of malignant neoplasm of prostate: Secondary | ICD-10-CM | POA: Diagnosis not present

## 2023-06-14 DIAGNOSIS — Z1152 Encounter for screening for COVID-19: Secondary | ICD-10-CM | POA: Diagnosis not present

## 2023-06-14 DIAGNOSIS — Z923 Personal history of irradiation: Secondary | ICD-10-CM

## 2023-06-14 DIAGNOSIS — R1312 Dysphagia, oropharyngeal phase: Secondary | ICD-10-CM | POA: Diagnosis not present

## 2023-06-14 DIAGNOSIS — Z79899 Other long term (current) drug therapy: Secondary | ICD-10-CM

## 2023-06-14 LAB — GLUCOSE, CAPILLARY
Glucose-Capillary: 100 mg/dL — ABNORMAL HIGH (ref 70–99)
Glucose-Capillary: 86 mg/dL (ref 70–99)
Glucose-Capillary: 99 mg/dL (ref 70–99)

## 2023-06-14 LAB — CBC WITH DIFFERENTIAL/PLATELET
Abs Immature Granulocytes: 0.02 10*3/uL (ref 0.00–0.07)
Basophils Absolute: 0 10*3/uL (ref 0.0–0.1)
Basophils Relative: 1 %
Eosinophils Absolute: 0 10*3/uL (ref 0.0–0.5)
Eosinophils Relative: 1 %
HCT: 28 % — ABNORMAL LOW (ref 39.0–52.0)
Hemoglobin: 8.9 g/dL — ABNORMAL LOW (ref 13.0–17.0)
Immature Granulocytes: 1 %
Lymphocytes Relative: 9 %
Lymphs Abs: 0.4 10*3/uL — ABNORMAL LOW (ref 0.7–4.0)
MCH: 30 pg (ref 26.0–34.0)
MCHC: 31.8 g/dL (ref 30.0–36.0)
MCV: 94.3 fL (ref 80.0–100.0)
Monocytes Absolute: 0.7 10*3/uL (ref 0.1–1.0)
Monocytes Relative: 16 %
Neutro Abs: 3 10*3/uL (ref 1.7–7.7)
Neutrophils Relative %: 72 %
Platelets: 201 10*3/uL (ref 150–400)
RBC: 2.97 MIL/uL — ABNORMAL LOW (ref 4.22–5.81)
RDW: 15.3 % (ref 11.5–15.5)
WBC: 4.2 10*3/uL (ref 4.0–10.5)
nRBC: 0 % (ref 0.0–0.2)

## 2023-06-14 LAB — BASIC METABOLIC PANEL
Anion gap: 9 (ref 5–15)
BUN: 17 mg/dL (ref 6–20)
CO2: 27 mmol/L (ref 22–32)
Calcium: 8.4 mg/dL — ABNORMAL LOW (ref 8.9–10.3)
Chloride: 98 mmol/L (ref 98–111)
Creatinine, Ser: 1.14 mg/dL (ref 0.61–1.24)
GFR, Estimated: >60 mL/min (ref >60)
Glucose, Bld: 106 mg/dL — ABNORMAL HIGH (ref 70–99)
Potassium: 4.3 mmol/L (ref 3.5–5.1)
Sodium: 134 mmol/L — ABNORMAL LOW (ref 135–145)

## 2023-06-14 LAB — RESP PANEL BY RT-PCR (RSV, FLU A&B, COVID)  RVPGX2
Influenza A by PCR: NEGATIVE
Influenza B by PCR: NEGATIVE
Resp Syncytial Virus by PCR: NEGATIVE
SARS Coronavirus 2 by RT PCR: NEGATIVE

## 2023-06-14 LAB — MRSA NEXT GEN BY PCR, NASAL: MRSA by PCR Next Gen: NOT DETECTED

## 2023-06-14 MED ORDER — ALBUTEROL SULFATE HFA 108 (90 BASE) MCG/ACT IN AERS
2.0000 | INHALATION_SPRAY | Freq: Once | RESPIRATORY_TRACT | Status: AC
  Administered 2023-06-14: 2 via RESPIRATORY_TRACT
  Filled 2023-06-14: qty 6.7

## 2023-06-14 MED ORDER — ALBUTEROL SULFATE (2.5 MG/3ML) 0.083% IN NEBU: 2.5000 mg | INHALATION_SOLUTION | RESPIRATORY_TRACT | Status: DC | PRN

## 2023-06-14 MED ORDER — APIXABAN 5 MG PO TABS
5.0000 mg | ORAL_TABLET | Freq: Two times a day (BID) | ORAL | Status: DC
  Administered 2023-06-14 – 2023-06-17 (×7): 5 mg via ORAL
  Filled 2023-06-14 (×7): qty 1

## 2023-06-14 MED ORDER — VANCOMYCIN HCL 1250 MG/250ML IV SOLN
1250.0000 mg | INTRAVENOUS | Status: DC
  Filled 2023-06-14: qty 250

## 2023-06-14 MED ORDER — ACETAMINOPHEN 650 MG RE SUPP: 650.0000 mg | Freq: Four times a day (QID) | RECTAL | Status: DC | PRN

## 2023-06-14 MED ORDER — DOXYCYCLINE HYCLATE 100 MG PO TABS
100.0000 mg | ORAL_TABLET | Freq: Once | ORAL | Status: AC
  Administered 2023-06-14: 100 mg via ORAL
  Filled 2023-06-14: qty 1

## 2023-06-14 MED ORDER — CHLORHEXIDINE GLUCONATE CLOTH 2 % EX PADS
6.0000 | MEDICATED_PAD | Freq: Every day | CUTANEOUS | Status: DC
  Administered 2023-06-14 – 2023-06-17 (×4): 6 via TOPICAL

## 2023-06-14 MED ORDER — SUCRALFATE 1 GM/10ML PO SUSP
1.0000 g | Freq: Three times a day (TID) | ORAL | Status: DC
  Administered 2023-06-14 – 2023-06-17 (×11): 1 g
  Filled 2023-06-14 (×11): qty 10

## 2023-06-14 MED ORDER — METOPROLOL TARTRATE 25 MG PO TABS
12.5000 mg | ORAL_TABLET | Freq: Two times a day (BID) | ORAL | Status: DC
  Administered 2023-06-14 – 2023-06-17 (×6): 12.5 mg via ORAL
  Filled 2023-06-14 (×7): qty 1

## 2023-06-14 MED ORDER — OXYCODONE HCL 5 MG PO TABS
10.0000 mg | ORAL_TABLET | Freq: Four times a day (QID) | ORAL | Status: DC | PRN
  Administered 2023-06-14 – 2023-06-16 (×9): 10 mg via ORAL
  Filled 2023-06-14 (×9): qty 2

## 2023-06-14 MED ORDER — DOXYCYCLINE HYCLATE 100 MG PO CAPS: 100.0000 mg | ORAL_CAPSULE | Freq: Two times a day (BID) | ORAL | 0 refills | Status: DC

## 2023-06-14 MED ORDER — OSMOLITE 1.2 CAL PO LIQD
1000.0000 mL | ORAL | Status: DC
  Administered 2023-06-14: 1000 mL
  Filled 2023-06-14 (×2): qty 1000

## 2023-06-14 MED ORDER — SODIUM CHLORIDE 0.9 % IV SOLN: 1.0000 g | Freq: Once | INTRAVENOUS | Status: DC

## 2023-06-14 MED ORDER — FAMOTIDINE 20 MG PO TABS
40.0000 mg | ORAL_TABLET | Freq: Every day | ORAL | Status: DC
  Administered 2023-06-14 – 2023-06-16 (×3): 40 mg via ORAL
  Filled 2023-06-14 (×3): qty 2

## 2023-06-14 MED ORDER — SODIUM CHLORIDE 0.9 % IV SOLN
2.0000 g | Freq: Two times a day (BID) | INTRAVENOUS | Status: DC
  Administered 2023-06-14 – 2023-06-15 (×3): 2 g via INTRAVENOUS
  Filled 2023-06-14 (×3): qty 12.5

## 2023-06-14 MED ORDER — ONDANSETRON HCL 4 MG PO TABS: 4.0000 mg | ORAL_TABLET | Freq: Four times a day (QID) | ORAL | Status: DC | PRN

## 2023-06-14 MED ORDER — SODIUM CHLORIDE 0.9% FLUSH: 10.0000 mL | INTRAVENOUS | Status: DC | PRN

## 2023-06-14 MED ORDER — QUETIAPINE FUMARATE 100 MG PO TABS
100.0000 mg | ORAL_TABLET | Freq: Every day | ORAL | Status: DC
  Administered 2023-06-14 – 2023-06-16 (×3): 100 mg via ORAL
  Filled 2023-06-14 (×3): qty 1

## 2023-06-14 MED ORDER — PROSOURCE TF20 ENFIT COMPATIBL EN LIQD
60.0000 mL | Freq: Every day | ENTERAL | Status: DC
  Administered 2023-06-14 – 2023-06-17 (×4): 60 mL
  Filled 2023-06-14 (×4): qty 60

## 2023-06-14 MED ORDER — ACETAMINOPHEN 325 MG PO TABS
650.0000 mg | ORAL_TABLET | Freq: Four times a day (QID) | ORAL | Status: DC | PRN
  Administered 2023-06-14 – 2023-06-16 (×3): 650 mg via ORAL
  Filled 2023-06-14 (×3): qty 2

## 2023-06-14 MED ORDER — CEFTRIAXONE SODIUM 1 G IJ SOLR: 500.0000 mg | Freq: Once | INTRAMUSCULAR | Status: DC

## 2023-06-14 MED ORDER — ONDANSETRON HCL 4 MG/2ML IJ SOLN: 4.0000 mg | Freq: Four times a day (QID) | INTRAMUSCULAR | Status: DC | PRN

## 2023-06-14 MED ORDER — VANCOMYCIN HCL 1250 MG/250ML IV SOLN
1250.0000 mg | INTRAVENOUS | Status: AC
Start: 2023-06-14 — End: 2023-06-14
  Administered 2023-06-14: 1250 mg via INTRAVENOUS
  Filled 2023-06-14: qty 250

## 2023-06-14 MED ORDER — AMOXICILLIN 500 MG PO CAPS: 1000.0000 mg | ORAL_CAPSULE | Freq: Three times a day (TID) | ORAL | 0 refills | Status: DC

## 2023-06-14 NOTE — ED Notes (Signed)
 ED TO INPATIENT HANDOFF REPORT  ED Nurse Name and Phone #: Crist Infante, RN [phone]  S Name/Age/Gender [name] 53 y.o. male Room/Bed: WA23/WA23  Code Status   Code Status: Full Code  Home/SNF/Other Home Patient oriented to: self, place, time, and situation Is this baseline? Yes   Triage Complete: Triage complete  Chief Complaint HCAP (healthcare-associated pneumonia) [J18.9]  Triage Note Pt. Arrives POV for a cough since Wednesday. Has been coughing up a lot of phlegm and has had weakness. Pt. Has hx of cancer. States that he vomited this morning as well. Sent by his PCP.   Allergies No Known Allergies  Level of Care/Admitting Diagnosis ED Disposition     ED Disposition  Admit   Condition  --   Comment  Hospital [facility]: [facility] [[id number]]  Level of Care: Med-Surg [16]  May admit patient to [facility] or [facility] if equivalent level of care is available:: Yes  Covid Evaluation: Confirmed COVID Negative  Diagnosis: HCAP (healthcare-associated pneumonia) [[id number]]  Admitting Physician: Maryln Gottron [[id number]]  Attending Physician: Kirby Crigler, MIR Jaxson.Roy [[id number]]  Certification:: I certify this patient will need inpatient services for at least 2 midnights  Expected Medical Readiness: 06/16/2023          B Medical/Surgery History Past Medical History:  Diagnosis Date   Anxiety    Bipolar 1 disorder (HCC)    EtOH dependence (HCC)    QUIT   GERD without esophagitis    Hepatitis C carrier (HCC)    Hypercholesteremia    Hypertension    laryngeal ca 02/2023   Otitis media 02/04/2023   Polysubstance dependence (HCC)    COCAINE- quit   RSV infection 05/03/2023   Sepsis due to pneumonia (HCC) 05/22/2023   Skin infection at gastrostomy tube site [facility]) 05/03/2023   Stroke [facility])    has some ST memory deficits from stroke.   Substance induced mood disorder (HCC) 11/05/2016   Syncope 05/21/2023   Past Surgical History:  Procedure  Laterality Date   ANEURYSM COILING     AGE 71 x2   APPENDECTOMY     BIOPSY  01/05/2017   Procedure: BIOPSY;  Surgeon: West Bali, MD;  [facility];  Service: Endoscopy;;  Duodenal and Gastric   COLONOSCOPY WITH PROPOFOL N/A 01/05/2017   Procedure: COLONOSCOPY WITH PROPOFOL;  Surgeon: West Bali, MD;  [facility];  Service: Endoscopy;  Laterality: N/A;  10:00am   DIRECT LARYNGOSCOPY Left 03/16/2023   Procedure: Direct laryngoscopy with biopsy of supraglottic mass;  Surgeon: Newman Pies, MD;  Location: [facility];  Service: ENT;  Laterality: Left;   ESOPHAGOGASTRODUODENOSCOPY (EGD) WITH PROPOFOL N/A 01/05/2017   Procedure: ESOPHAGOGASTRODUODENOSCOPY (EGD) WITH PROPOFOL;  Surgeon: West Bali, MD;  [facility];  Service: Endoscopy;  Laterality: N/A;   IR GASTROSTOMY TUBE MOD SED  04/28/2023   IR IMAGING GUIDED PORT INSERTION  04/28/2023   IR PATIENT EVAL TECH 0-60 MINS  05/03/2023   LAPAROSCOPIC APPENDECTOMY     MASS EXCISION Right 01/13/2017   Procedure: EXCISION LIPOMA RIGHT ARM;  Surgeon: Lucretia Roers, MD;  Location: [facility];  Service: General;  Laterality: Right;     A IV Location/Drains/Wounds Patient Lines/Drains/Airways Status     Active Line/Drains/Airways     Name Placement date Placement time Site Days   Implanted Port 04/28/23 Right Chest Single 04/28/23  1013  Chest  47   Gastrostomy/Enterostomy Percutaneous endoscopic gastrostomy (PEG)  20 Fr. LUQ 04/28/23  1035  LUQ  47            Intake/Output Last 24 hours  Intake/Output Summary (Last 24 hours) at 06/14/2023 1225 Last data filed at 06/14/2023 1143 Gross per 24 hour  Intake 103.58 ml  Output --  Net 103.58 ml    Labs/Imaging Results for orders placed or performed during the hospital encounter of 06/14/23 (from the past 48 hours)  Resp panel by RT-PCR (RSV, Flu A&B, Covid) Anterior Nasal Swab     Status: None   Collection Time: 06/14/23  6:43 AM    Specimen: Anterior Nasal Swab  Result Value Ref Range   SARS Coronavirus 2 by RT PCR NEGATIVE NEGATIVE    Comment: (NOTE) SARS-CoV-2 target nucleic acids are NOT DETECTED.  The SARS-CoV-2 RNA is generally detectable in upper respiratory specimens during the acute phase of infection. The lowest concentration of SARS-CoV-2 viral copies this assay can detect is 138 copies/mL. A negative result does not preclude SARS-Cov-2 infection and should not be used as the sole basis for treatment or other patient management decisions. A negative result may occur with  improper specimen collection/handling, submission of specimen other than nasopharyngeal swab, presence of viral mutation(s) within the areas targeted by this assay, and inadequate number of viral copies(<138 copies/mL). A negative result must be combined with clinical observations, patient history, and epidemiological information. The expected result is Negative.  Fact Sheet for Patients:  BloggerCourse.com  Fact Sheet for Healthcare Providers:  SeriousBroker.it  This test is no t yet approved or cleared by the [place] FDA and  has been authorized for detection and/or diagnosis of SARS-CoV-2 by FDA under an Emergency Use Authorization (EUA). This EUA will remain  in effect (meaning this test can be used) for the duration of the COVID-19 declaration under Section 564(b)(1) of the Act, 21 U.S.C.section 360bbb-3(b)(1), unless the authorization is terminated  or revoked sooner.       Influenza A by PCR NEGATIVE NEGATIVE   Influenza B by PCR NEGATIVE NEGATIVE    Comment: (NOTE) The Xpert Xpress SARS-CoV-2/FLU/RSV plus assay is intended as an aid in the diagnosis of influenza from Nasopharyngeal swab specimens and should not be used as a sole basis for treatment. Nasal washings and aspirates are unacceptable for Xpert Xpress SARS-CoV-2/FLU/RSV testing.  Fact Sheet for  Patients: BloggerCourse.com  Fact Sheet for Healthcare Providers: SeriousBroker.it  This test is not yet approved or cleared by the [place] FDA and has been authorized for detection and/or diagnosis of SARS-CoV-2 by FDA under an Emergency Use Authorization (EUA). This EUA will remain in effect (meaning this test can be used) for the duration of the COVID-19 declaration under Section 564(b)(1) of the Act, 21 U.S.C. section 360bbb-3(b)(1), unless the authorization is terminated or revoked.     Resp Syncytial Virus by PCR NEGATIVE NEGATIVE    Comment: (NOTE) Fact Sheet for Patients: BloggerCourse.com  Fact Sheet for Healthcare Providers: SeriousBroker.it  This test is not yet approved or cleared by the [place] FDA and has been authorized for detection and/or diagnosis of SARS-CoV-2 by FDA under an Emergency Use Authorization (EUA). This EUA will remain in effect (meaning this test can be used) for the duration of the COVID-19 declaration under Section 564(b)(1) of the Act, 21 U.S.C. section 360bbb-3(b)(1), unless the authorization is terminated or revoked.  Performed at [facility], [address] [place], [place] [address]   Basic metabolic panel  Status: Abnormal   Collection Time: 06/14/23 10:58 AM  Result Value Ref Range   Sodium 134 (L) 135 - 145 mmol/L   Potassium 4.3 3.5 - 5.1 mmol/L   Chloride 98 98 - 111 mmol/L   CO2 27 22 - 32 mmol/L   Glucose, Bld 106 (H) 70 - 99 mg/dL    Comment: Glucose reference range applies only to samples taken after fasting for at least 8 hours.   BUN 17 6 - 20 mg/dL   Creatinine, Ser 5.95 0.61 - 1.24 mg/dL   Calcium 8.4 (L) 8.9 - 10.3 mg/dL   GFR, Estimated >63 >87 mL/min    Comment: (NOTE) Calculated using the CKD-EPI Creatinine Equation (2021)    Anion gap 9 5 - 15    Comment: Performed at [facility], [address], [place], [place] [address]  CBC with Differential     Status: Abnormal   Collection Time: 06/14/23 10:58 AM  Result Value Ref Range   WBC 4.2 4.0 - 10.5 K/uL   RBC 2.97 (L) 4.22 - 5.81 MIL/uL   Hemoglobin 8.9 (L) 13.0 - 17.0 g/dL   HCT 29.5 (L) 18.8 - 41.6 %   MCV 94.3 80.0 - 100.0 fL   MCH 30.0 26.0 - 34.0 pg   MCHC 31.8 30.0 - 36.0 g/dL   RDW 60.6 30.1 - 60.1 %   Platelets 201 150 - 400 K/uL   nRBC 0.0 0.0 - 0.2 %   Neutrophils Relative % 72 %   Neutro Abs 3.0 1.7 - 7.7 K/uL   Lymphocytes Relative 9 %   Lymphs Abs 0.4 (L) 0.7 - 4.0 K/uL   Monocytes Relative 16 %   Monocytes Absolute 0.7 0.1 - 1.0 K/uL   Eosinophils Relative 1 %   Eosinophils Absolute 0.0 0.0 - 0.5 K/uL   Basophils Relative 1 %   Basophils Absolute 0.0 0.0 - 0.1 K/uL   Immature Granulocytes 1 %   Abs Immature Granulocytes 0.02 0.00 - 0.07 K/uL    Comment: Performed at [facility], [address] [place], [place] [address]   DG Chest 2 View Result Date: 06/14/2023 CLINICAL DATA:  Productive cough for several days. Laryngeal carcinoma. EXAM: CHEST - 2 VIEW COMPARISON:  05/21/2023 FINDINGS: The heart size and mediastinal contours are within normal limits. Right-sided Port-A-Cath remains in place. New airspace disease is seen in the inferior right middle lobe, suspicious for pneumonia. Left lung is clear. No pleural effusion. The visualized skeletal structures are unremarkable. IMPRESSION: New right middle lobe infiltrate, suspicious for pneumonia. Recommend continued chest radiographic follow-up to confirm resolution. Electronically Signed   By: Danae Orleans M.D.   On: 06/14/2023 09:01    Pending Labs Unresulted Labs (From admission, onward)     Start     Ordered   06/15/23 0500  Basic metabolic panel  Tomorrow morning,   R        06/14/23 1157   06/15/23 0500  CBC  Tomorrow morning,   R        06/14/23 1157   06/14/23 1124  MRSA Next Gen by PCR,  Nasal  (MRSA Screening)  Once,   R        06/14/23 1123            Vitals/Pain Today's Vitals   06/14/23 0637 06/14/23 0645 06/14/23 0717 06/14/23 1100  BP: 115/78   (!) 133/90  Pulse: 70   72  Resp: 16   15  Temp: 99.4 F (37.4 C)   99.4 F (37.4 C)  TempSrc: Oral   Oral  SpO2: 95%   98%  PainSc:  7  6      Isolation Precautions No active isolations  Medications Medications  ceFEPIme (MAXIPIME) 2 g in sodium chloride 0.9 % 100 mL IVPB (0 g Intravenous Stopped 06/14/23 1143)  vancomycin (VANCOREADY) IVPB 1250 mg/250 mL (1,250 mg Intravenous New Bag/Given 06/14/23 1144)  Oxycodone HCl TABS 10 mg (has no administration in time range)  metoprolol tartrate (LOPRESSOR) tablet 12.5 mg (has no administration in time range)  QUEtiapine (SEROQUEL) tablet 100 mg (has no administration in time range)  famotidine (PEPCID) tablet 40 mg (has no administration in time range)  sucralfate (CARAFATE) 1 GM/10ML suspension 1 g (has no administration in time range)  apixaban (ELIQUIS) tablet 5 mg (has no administration in time range)  feeding supplement (PROSource TF20) liquid 60 mL (has no administration in time range)  acetaminophen (TYLENOL) tablet 650 mg (has no administration in time range)    Or  acetaminophen (TYLENOL) suppository 650 mg (has no administration in time range)  ondansetron (ZOFRAN) tablet 4 mg (has no administration in time range)    Or  ondansetron (ZOFRAN) injection 4 mg (has no administration in time range)  albuterol (PROVENTIL) (2.5 MG/3ML) 0.083% nebulizer solution 2.5 mg (has no administration in time range)  vancomycin (VANCOREADY) IVPB 1250 mg/250 mL (has no administration in time range)  albuterol (VENTOLIN HFA) 108 (90 Base) MCG/ACT inhaler 2 puff (2 puffs Inhalation Given 06/14/23 0924)  doxycycline (VIBRA-TABS) tablet 100 mg (100 mg Oral Given 06/14/23 1048)    Mobility walks     Focused Assessments Neuro Assessment Handoff:  Swallow screen pass?   N/A         Neuro Assessment:   Neuro Checks:      Has TPA been given? No If patient is a Neuro Trauma and patient is going to OR before floor call report to 4N Charge nurse: [phone] or [phone]   R Recommendations: See Admitting Provider Note  Report given to:   Additional Notes:

## 2023-06-14 NOTE — ED Provider Notes (Signed)
 [place] EMERGENCY DEPARTMENT AT [facility] Provider Note   CSN: [id number] Arrival date & time: 06/14/23  4098     History {Add pertinent medical, surgical, social history, OB history to HPI:1} Chief Complaint  Patient presents with   Cough    [name] is a 53 y.o. male.  HPI    53 year old patient comes in with chief complaint of cough for the last 2 days.  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  He states he has been coughing for the last 2 days.  Cough is mostly nonproductive.  Patient had RSV infection this year already and was recently admitted to [facility] emergency room for pneumonia.  He indicates that his symptoms had gotten better, until the cough restarted.  He is having subjective fevers and sweats.  Home Medications Prior to Admission medications   Medication Sig Start Date End Date Taking? Authorizing Provider  amoxicillin (AMOXIL) 500 MG capsule Take 2 capsules (1,000 mg total) by mouth 3 (three) times daily. 06/14/23  Yes Derwood Kaplan, MD  doxycycline (VIBRAMYCIN) 100 MG capsule Take 1 capsule (100 mg total) by mouth 2 (two) times daily. 06/14/23  Yes Derwood Kaplan, MD  acetaminophen (TYLENOL) 500 MG tablet Take 1,000 mg by mouth every 6 (six) hours as needed for mild pain (pain score 1-3) or moderate pain (pain score 4-6).    [provider]  apixaban (ELIQUIS) 5 MG TABS tablet Take 2 tablets (10 mg total) by mouth 2 (two) times daily for 7 days, THEN 1 tablet (5 mg total) 2 (two) times daily. 05/27/23 11/30/23  Vassie Loll, MD  dexamethasone (DECADRON) 4 MG tablet Take 2 tablets (8 mg) by mouth daily x 3 days starting the day after cisplatin chemotherapy. Take with food. 04/14/23   Pasam, Archie Patten, MD  famotidine (PEPCID) 40 MG tablet Take 1 tablet (40 mg total) by mouth at bedtime. 05/27/23   Vassie Loll, MD  FLUoxetine (PROZAC) 20 MG capsule Take 1  capsule (20 mg total) by mouth daily. 03/29/23   [name], MD  guaiFENesin-dextromethorphan (ROBITUSSIN DM) 100-10 MG/5ML syrup Place 15 mLs into feeding tube every 6 (six) hours. 05/27/23   Vassie Loll, MD  hydrOXYzine (ATARAX) 25 MG tablet Take 1 tablet (25 mg total) by mouth 3 (three) times daily as needed for anxiety. Patient taking differently: Take 25 mg by mouth at bedtime. 02/04/23   [name], MD  lidocaine (XYLOCAINE) 2 % solution Patient: Mix 1part 2% viscous lidocaine, 1part H20. Swallow 10mL of diluted mixture, before meals and at bedtime, up to QID 03/26/23   Lonie Peak, MD  lidocaine-prilocaine (EMLA) cream Apply to affected area once 04/14/23   Pasam, Avinash, MD  metoprolol tartrate (LOPRESSOR) 25 MG tablet Take 0.5 tablets (12.5 mg total) by mouth 2 (two) times daily. 05/27/23   Vassie Loll, MD  Nutritional Supplements (NUTREN 1.5) LIQD 54ml/hr for 18 hours/day (start at 3pm and stop feeding at 9am).  Flush of water at 3pm, 7pm, 11pm and 9am. 05/31/23   Lonie Peak, MD  ondansetron (ZOFRAN) 8 MG tablet Take 1 tablet (8 mg total) by mouth every 8 (eight) hours as needed for nausea or vomiting. Start on the third day after cisplatin. 04/14/23   Pasam, Archie Patten, MD  oxyCODONE 10 MG TABS Take 1 tablet (10 mg total) by mouth every 6 (six) hours as needed for severe pain (pain score 7-10). 05/28/23  Pasam, Avinash, MD  pantoprazole (PROTONIX) 40 MG tablet Take 1 tablet (40 mg total) by mouth 2 (two) times daily. 05/27/23   Vassie Loll, MD  prochlorperazine (COMPAZINE) 10 MG tablet TAKE 1 TABLET(10 MG) BY MOUTH EVERY 6 HOURS AS NEEDED FOR NAUSEA OR VOMITING 04/29/23   Pasam, Avinash, MD  Protein (FEEDING SUPPLEMENT, PROSOURCE TF20,) liquid Place 60 mLs into feeding tube daily. 05/08/23   Tobey Grim, MD  QUEtiapine (SEROQUEL) 100 MG tablet Take 1 tablet (100 mg total) by mouth at bedtime. 05/31/23   Elsie Lincoln, MD  sucralfate (CARAFATE) 1 GM/10ML  suspension Place 10 mLs (1 g total) into feeding tube in the morning, at noon, and at bedtime for 20 days. 05/27/23 06/16/23  Vassie Loll, MD      Allergies    Patient has no known allergies.    Review of Systems   Review of Systems  All other systems reviewed and are negative.   Physical Exam Updated Vital Signs BP 115/78 (BP Location: Right Arm)   Pulse 70   Temp 99.4 F (37.4 C) (Oral)   Resp 16   SpO2 95%  Physical Exam Vitals and nursing note reviewed.  Constitutional:      Appearance: He is well-developed.  HENT:     Head: Atraumatic.  Cardiovascular:     Rate and Rhythm: Normal rate.  Pulmonary:     Effort: Pulmonary effort is normal.     Breath sounds: Rhonchi present.  Musculoskeletal:     Cervical back: Neck supple.  Skin:    General: Skin is warm.  Neurological:     Mental Status: He is alert and oriented to person, place, and time.     ED Results / Procedures / Treatments   Labs (all labs ordered are listed, but only abnormal results are displayed) Labs Reviewed  RESP PANEL BY RT-PCR (RSV, FLU A&B, COVID)  [id number]    EKG None  Radiology DG Chest 2 View Result Date: 06/14/2023 CLINICAL DATA:  Productive cough for several days. Laryngeal carcinoma. EXAM: CHEST - 2 VIEW COMPARISON:  05/21/2023 FINDINGS: The heart size and mediastinal contours are within normal limits. Right-sided Port-A-Cath remains in place. New airspace disease is seen in the inferior right middle lobe, suspicious for pneumonia. Left lung is clear. No pleural effusion. The visualized skeletal structures are unremarkable. IMPRESSION: New right middle lobe infiltrate, suspicious for pneumonia. Recommend continued chest radiographic follow-up to confirm resolution. Electronically Signed   By: Danae Orleans M.D.   On: 06/14/2023 09:01    Procedures Procedures  {Document cardiac monitor, telemetry assessment procedure when appropriate:1}  Medications Ordered in ED Medications   cefTRIAXone (ROCEPHIN) injection 500 mg (has no administration in time range)  doxycycline (VIBRA-TABS) tablet 100 mg (has no administration in time range)  albuterol (VENTOLIN HFA) 108 (90 Base) MCG/ACT inhaler 2 puff (2 puffs Inhalation Given 06/14/23 2376)    ED Course/ Medical Decision Making/ A&P   {   Click here for ABCD2, HEART and other calculatorsREFRESH Note before signing :1}                              Medical Decision Making Amount and/or Complexity of Data Reviewed Radiology: ordered.  Risk Prescription drug management.  This patient presents to the ED with chief complaint(s) of cough with pertinent past medical history of polysubstance use disorder, thrombosis currently on anticoagulation, supraglottic neoplasm.patient was recently admitted to [facility]  emergency room for pneumonia. the complaint involves an extensive differential diagnosis and also carries with it a high risk of complications and morbidity.    The differential diagnosis includes : Flu, pneumonia, pulmonary edema, pleural effusion, bronchitis  The initial plan is to get basic labs and chest x-ray.  Additional history obtained: Records reviewed previous admission documents  Independent labs interpretation:  The following labs were independently interpreted: ***  Independent visualization and interpretation of imaging: - I independently visualized the following imaging with scope of interpretation limited to determining acute life threatening conditions related to emergency care: Xrays of chest, which revealed new right-sided streaky opacity  Treatment and Reassessment: Results discussed with the patient.  Consultation: - Consulted or discussed management/test interpretation with external professional: ***  Consideration for admission or further workup:  Social Determinants of health:   Final Clinical Impression(s) / ED Diagnoses Final diagnoses:  Community acquired pneumonia, unspecified  laterality    Rx / DC Orders ED Discharge Orders          Ordered    amoxicillin (AMOXIL) 500 MG capsule  3 times daily        06/14/23 1017    doxycycline (VIBRAMYCIN) 100 MG capsule  2 times daily        06/14/23 1017

## 2023-06-14 NOTE — Plan of Care (Signed)

## 2023-06-14 NOTE — ED Triage Notes (Signed)
 Pt. Arrives POV for a cough since Wednesday. Has been coughing up a lot of phlegm and has had weakness. Pt. Has hx of cancer. States that he vomited this morning as well. Sent by his PCP.

## 2023-06-14 NOTE — ED Notes (Signed)
 RN called to inform 5W that patient is about to be transported clear understanding voiced by Diplomatic Services operational officer

## 2023-06-14 NOTE — Discharge Instructions (Addendum)
 You were seen in the ER for cough.  X-ray confirms that you have a pneumonia.  Use the inhaler only if you are having chest tightness or wheezing.  Start taking the antibiotics that are prescribed.  Return to the ER if you start having worsening shortness of breath. Follow-up with your primary care doctor in 7 to 10 days.

## 2023-06-14 NOTE — H&P (Signed)
 History and Physical  [name] ZOX:[id number] DOB: 03/10/1971 DOA: 06/14/2023  PCP: [name], MD   Chief Complaint: Cough, vomiting  HPI: [name] is a 53 y.o. 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  History is provided by the patient, I also discussed with his wife and daughter at the bedside.  Patient states that after returning home from [facility] on 2/27 he was overall doing well, has been compliant with his medications.  He takes some pills by mouth including Eliquis which she was recently started on for right internal jugular vein DVT.  He takes some pills by mouth, but otherwise all of his nutrition is through his PEG tube.  In any case, for the last 5 days he has been feeling more lethargic than usual, with increased cough with green sputum production.  Denies any fevers or chills.  Yesterday his cough got worse and he also started vomiting.  He therefore came to the ER for evaluation.  Workup as detailed below shows evidence of a right middle lobe consolidation.  He was started on empiric IV antibiotics and admitted to the hospitalist service.  Review of Systems: Please see HPI for pertinent positives and negatives. A complete 10 system review of systems are otherwise negative.  Past Medical History:  Diagnosis Date   Anxiety    Bipolar 1 disorder (HCC)    EtOH dependence (HCC)    QUIT   GERD without esophagitis    Hepatitis C carrier (HCC)    Hypercholesteremia    Hypertension    laryngeal ca 02/2023   Otitis media 02/04/2023   Polysubstance dependence (HCC)    COCAINE- quit   RSV infection 05/03/2023   Sepsis due to pneumonia (HCC) 05/22/2023   Skin infection at  gastrostomy tube site [facility]) 05/03/2023   Stroke [facility])    has some ST memory deficits from stroke.   Substance induced mood disorder (HCC) 11/05/2016   Syncope 05/21/2023   Past Surgical History:  Procedure Laterality Date   ANEURYSM COILING     AGE 81 x2   APPENDECTOMY     BIOPSY  01/05/2017   Procedure: BIOPSY;  Surgeon: West Bali, MD;  Location: [facility];  Service: Endoscopy;;  Duodenal and Gastric   COLONOSCOPY WITH PROPOFOL N/A 01/05/2017   Procedure: COLONOSCOPY WITH PROPOFOL;  Surgeon: West Bali, MD;  Location: [facility];  Service: Endoscopy;  Laterality: N/A;  10:00am   DIRECT LARYNGOSCOPY Left 03/16/2023   Procedure: Direct laryngoscopy with biopsy of supraglottic mass;  Surgeon: Newman Pies, MD;  Location: [facility];  Service: ENT;  Laterality: Left;   ESOPHAGOGASTRODUODENOSCOPY (EGD) WITH PROPOFOL N/A 01/05/2017   Procedure: ESOPHAGOGASTRODUODENOSCOPY (EGD) WITH PROPOFOL;  Surgeon: West Bali, MD;  Location: [facility];  Service: Endoscopy;  Laterality: N/A;   IR GASTROSTOMY TUBE MOD SED  04/28/2023   IR IMAGING GUIDED PORT INSERTION  04/28/2023   IR PATIENT EVAL TECH 0-60 MINS  05/03/2023   LAPAROSCOPIC APPENDECTOMY     MASS EXCISION Right 01/13/2017   Procedure: EXCISION LIPOMA RIGHT ARM;  Surgeon: Lucretia Roers, MD;  Location: [facility] OR[facility];  Service: General;  Laterality: Right;   Social History:  reports that he has quit smoking. His smoking use  included cigarettes. He has a 6.3 pack-year smoking history. He has never used smokeless tobacco. He reports current alcohol use of about 1.0 standard drink of alcohol per week. He reports that he does not currently use drugs after having used the following drugs: "Crack" cocaine, Cocaine, and Marijuana.  No Known Allergies  Family History  Problem Relation Age of Onset   Cancer Mother        Lung   Stomach cancer Mother    Cancer Sister        Bone cancer   Bone cancer Sister 44   Cancer  Brother        3 brothers with prostate cancer   Colon cancer Brother 93   Colon polyps Neg Hx      Prior to Admission medications   Medication Sig Start Date End Date Taking? Authorizing Provider  amoxicillin (AMOXIL) 500 MG capsule Take 2 capsules (1,000 mg total) by mouth 3 (three) times daily. 06/14/23  Yes Derwood Kaplan, MD  doxycycline (VIBRAMYCIN) 100 MG capsule Take 1 capsule (100 mg total) by mouth 2 (two) times daily. 06/14/23  Yes Derwood Kaplan, MD  acetaminophen (TYLENOL) 500 MG tablet Take 1,000 mg by mouth every 6 (six) hours as needed for mild pain (pain score 1-3) or moderate pain (pain score 4-6).    [provider]  apixaban (ELIQUIS) 5 MG TABS tablet Take 2 tablets (10 mg total) by mouth 2 (two) times daily for 7 days, THEN 1 tablet (5 mg total) 2 (two) times daily. 05/27/23 11/30/23  Vassie Loll, MD  dexamethasone (DECADRON) 4 MG tablet Take 2 tablets (8 mg) by mouth daily x 3 days starting the day after cisplatin chemotherapy. Take with food. 04/14/23   Pasam, Archie Patten, MD  famotidine (PEPCID) 40 MG tablet Take 1 tablet (40 mg total) by mouth at bedtime. 05/27/23   Vassie Loll, MD  FLUoxetine (PROZAC) 20 MG capsule Take 1 capsule (20 mg total) by mouth daily. 03/29/23   [name], MD  guaiFENesin-dextromethorphan (ROBITUSSIN DM) 100-10 MG/5ML syrup Place 15 mLs into feeding tube every 6 (six) hours. 05/27/23   Vassie Loll, MD  hydrOXYzine (ATARAX) 25 MG tablet Take 1 tablet (25 mg total) by mouth 3 (three) times daily as needed for anxiety. Patient taking differently: Take 25 mg by mouth at bedtime. 02/04/23   [name], MD  lidocaine (XYLOCAINE) 2 % solution Patient: Mix 1part 2% viscous lidocaine, 1part H20. Swallow 10mL of diluted mixture, before meals and at bedtime, up to QID 03/26/23   Lonie Peak, MD  lidocaine-prilocaine (EMLA) cream Apply to affected area once 04/14/23   Pasam, Avinash, MD  metoprolol tartrate (LOPRESSOR) 25 MG tablet  Take 0.5 tablets (12.5 mg total) by mouth 2 (two) times daily. 05/27/23   Vassie Loll, MD  Nutritional Supplements (NUTREN 1.5) LIQD 19ml/hr for 18 hours/day (start at 3pm and stop feeding at 9am).  Flush of water at 3pm, 7pm, 11pm and 9am. 05/31/23   Lonie Peak, MD  ondansetron (ZOFRAN) 8 MG tablet Take 1 tablet (8 mg total) by mouth every 8 (eight) hours as needed for nausea or vomiting. Start on the third day after cisplatin. 04/14/23   Pasam, Archie Patten, MD  oxyCODONE 10 MG TABS Take 1 tablet (10 mg total) by mouth every 6 (six) hours as needed for severe pain (pain score 7-10). 05/28/23   Pasam, Avinash, MD  pantoprazole (PROTONIX) 40 MG tablet Take 1 tablet (40 mg total) by mouth 2 (  two) times daily. 05/27/23   Vassie Loll, MD  prochlorperazine (COMPAZINE) 10 MG tablet TAKE 1 TABLET(10 MG) BY MOUTH EVERY 6 HOURS AS NEEDED FOR NAUSEA OR VOMITING 04/29/23   Pasam, Avinash, MD  Protein (FEEDING SUPPLEMENT, PROSOURCE TF20,) liquid Place 60 mLs into feeding tube daily. 05/08/23   Tobey Grim, MD  QUEtiapine (SEROQUEL) 100 MG tablet Take 1 tablet (100 mg total) by mouth at bedtime. 05/31/23   Elsie Lincoln, MD  sucralfate (CARAFATE) 1 GM/10ML suspension Place 10 mLs (1 g total) into feeding tube in the morning, at noon, and at bedtime for 20 days. 05/27/23 06/16/23  Vassie Loll, MD    Physical Exam: BP (!) 133/90   Pulse 72   Temp 99.4 F (37.4 C) (Oral)   Resp 15   SpO2 98%  General:  Alert, oriented, calm, in no acute distress, his wife and daughter are at the bedside.  He is speaking in full sentences without any respiratory distress.  No cough. Cardiovascular: RRR, no murmurs or rubs, no peripheral edema  Respiratory: clear to auscultation bilaterally, no wheezes, no crackles  Abdomen: soft, nontender, nondistended, normal bowel tones heard, PEG tube in place and site looks benign Skin: dry, no rashes  Musculoskeletal: no joint effusions, normal range of motion  Psychiatric:  appropriate affect, normal speech  Neurologic: extraocular muscles intact, clear speech, moving all extremities with intact sensorium         Labs on Admission:  Basic Metabolic Panel: Recent Labs  Lab 06/14/23 1058  NA 134*  K 4.3  CL 98  CO2 27  GLUCOSE 106*  BUN 17  CREATININE 1.14  CALCIUM 8.4*   Liver Function Tests: No results for input(s): "AST", "ALT", "ALKPHOS", "BILITOT", "PROT", "ALBUMIN" in the last 168 hours. No results for input(s): "LIPASE", "AMYLASE" in the last 168 hours. No results for input(s): "AMMONIA" in the last 168 hours. CBC: Recent Labs  Lab 06/14/23 1058  WBC 4.2  NEUTROABS 3.0  HGB 8.9*  HCT 28.0*  MCV 94.3  PLT 201   Cardiac Enzymes: No results for input(s): "CKTOTAL", "CKMB", "CKMBINDEX", "TROPONINI" in the last 168 hours. BNP (last 3 results) No results for input(s): "BNP" in the last 8760 hours.  ProBNP (last 3 results) No results for input(s): "PROBNP" in the last 8760 hours.  CBG: No results for input(s): "GLUCAP" in the last 168 hours.  Radiological Exams on Admission: DG Chest 2 View Result Date: 06/14/2023 CLINICAL DATA:  Productive cough for several days. Laryngeal carcinoma. EXAM: CHEST - 2 VIEW COMPARISON:  05/21/2023 FINDINGS: The heart size and mediastinal contours are within normal limits. Right-sided Port-A-Cath remains in place. New airspace disease is seen in the inferior right middle lobe, suspicious for pneumonia. Left lung is clear. No pleural effusion. The visualized skeletal structures are unremarkable. IMPRESSION: New right middle lobe infiltrate, suspicious for pneumonia. Recommend continued chest radiographic follow-up to confirm resolution. Electronically Signed   By: Danae Orleans M.D.   On: 06/14/2023 09:01   Assessment/Plan DELOSS AMICO is a 53 y.o. male with medical history significant for GERD, polysubstance abuse, hypertension, hyperlipidemia, squamous cell carcinoma of the supraglottis recently completed  chemotherapy and radiation and was also hospitalized at [facility] for pneumonia and sepsis now being admitted to the hospital with recurrent pneumonia.   Healthcare acquired pneumonia-with recent hospitalization until 2/27 for community-acquired pneumonia.  He now has recurrent cough, however no evidence of sepsis. -Inpatient admission -Empiric IV vancomycin and IV cefepime -MRSA  screen, can discontinue vancomycin if negative -Incentive spirometer  Squamous cell carcinoma of the supraglottis-recently completed chemotherapy and radiation.  He is essentially PEG tube dependent. -primary oncologist Dr. Campbell Riches added to inpatient treatment team -RD consultation to resume tube feeds  Right internal jugular DVT-continue Eliquis  GERD-continue famotidine    Code Status: Full Code  Consults called: None  Admission status: The appropriate patient status for this patient is INPATIENT. Inpatient status is judged to be reasonable and necessary in order to provide the required intensity of service to ensure the patient's safety. The patient's presenting symptoms, physical exam findings, and initial radiographic and laboratory data in the context of their chronic comorbidities is felt to place them at high risk for further clinical deterioration. Furthermore, it is not anticipated that the patient will be medically stable for discharge from the hospital within 2 midnights of admission.    I certify that at the point of admission it is my clinical judgment that the patient will require inpatient hospital care spanning beyond 2 midnights from the point of admission due to high intensity of service, high risk for further deterioration and high frequency of surveillance required  Time spent: 59 minutes  Ellanie Oppedisano Sharlette Dense MD [facility] Pager [phone]  If 7PM-7AM, please contact night-coverage [email or web address] Password [facility]  06/14/2023, 11:57 AM

## 2023-06-14 NOTE — Progress Notes (Signed)
 Brief Nutrition Note  Consult received for enteral/tube feeding initiation and management.  Adult Enteral Nutrition Protocol initiated.  Osmolite 1.5 @ 45mL/hr with 10mL Q4H advancements to 19mL/hr. Full assessment to follow.  Patient has a PEG tube.  Admitting Dx: HCAP (healthcare-associated pneumonia) [J18.9] Community acquired pneumonia, unspecified laterality [J18.9]  Body mass index is 22.86 kg/m.   Labs:  Recent Labs  Lab 06/14/23 1058  NA 134*  K 4.3  CL 98  CO2 27  BUN 17  CREATININE 1.14  CALCIUM 8.4*  GLUCOSE 106*    [name] RD, LDN Contact via Secure Chat.

## 2023-06-14 NOTE — Progress Notes (Signed)
 Pharmacy Antibiotic Note  [name] is a 53 y.o. male admitted on 06/14/2023 with pneumonia.  Pharmacy has been consulted for Vanco dosing.  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  - Recent admissions 2/3-2/9 and 2/21-2/27  ID: PNA - Temp 99.4, WBC 4.2,   Vanco 3/17>> Cefepime  3/17>>  3/17 RVP: neg  Plan: Cefepime 2g IV q12 hrs Vanco 1250mg  IV x 1 Vancomycin 1250 mg IV Q 24 hrs. Goal AUC 400-550. Expected AUC: 486 SCr used: 1.14 MRSA screen, can discontinue vancomycin if negative        Temp (24hrs), Avg:99.4 F (37.4 C), Min:99.4 F (37.4 C), Max:99.4 F (37.4 C)  Recent Labs  Lab 06/14/23 1058  WBC 4.2  CREATININE 1.14    Estimated Creatinine Clearance: 62 mL/min (by C-G formula based on SCr of 1.14 mg/dL).    No Known Allergies  Aikeem Lilley S. Merilynn Finland, PharmD, BCPS Clinical Staff Pharmacist  Misty Stanley Stillinger 06/14/2023 12:15 PM

## 2023-06-15 ENCOUNTER — Inpatient Hospital Stay (HOSPITAL_COMMUNITY)

## 2023-06-15 DIAGNOSIS — J189 Pneumonia, unspecified organism: Secondary | ICD-10-CM | POA: Diagnosis not present

## 2023-06-15 DIAGNOSIS — M79662 Pain in left lower leg: Secondary | ICD-10-CM | POA: Diagnosis not present

## 2023-06-15 LAB — BASIC METABOLIC PANEL
Anion gap: 10 (ref 5–15)
BUN: 20 mg/dL (ref 6–20)
CO2: 24 mmol/L (ref 22–32)
Calcium: 8.5 mg/dL — ABNORMAL LOW (ref 8.9–10.3)
Chloride: 102 mmol/L (ref 98–111)
Creatinine, Ser: 0.97 mg/dL (ref 0.61–1.24)
GFR, Estimated: >60 mL/min (ref >60)
Glucose, Bld: 120 mg/dL — ABNORMAL HIGH (ref 70–99)
Potassium: 4 mmol/L (ref 3.5–5.1)
Sodium: 136 mmol/L (ref 135–145)

## 2023-06-15 LAB — GLUCOSE, CAPILLARY
Glucose-Capillary: 104 mg/dL — ABNORMAL HIGH (ref 70–99)
Glucose-Capillary: 113 mg/dL — ABNORMAL HIGH (ref 70–99)
Glucose-Capillary: 117 mg/dL — ABNORMAL HIGH (ref 70–99)
Glucose-Capillary: 117 mg/dL — ABNORMAL HIGH (ref 70–99)
Glucose-Capillary: 119 mg/dL — ABNORMAL HIGH (ref 70–99)
Glucose-Capillary: 126 mg/dL — ABNORMAL HIGH (ref 70–99)

## 2023-06-15 LAB — CBC
HCT: 27.6 % — ABNORMAL LOW (ref 39.0–52.0)
Hemoglobin: 9.1 g/dL — ABNORMAL LOW (ref 13.0–17.0)
MCH: 30.4 pg (ref 26.0–34.0)
MCHC: 33 g/dL (ref 30.0–36.0)
MCV: 92.3 fL (ref 80.0–100.0)
Platelets: 213 10*3/uL (ref 150–400)
RBC: 2.99 MIL/uL — ABNORMAL LOW (ref 4.22–5.81)
RDW: 15.1 % (ref 11.5–15.5)
WBC: 3.2 10*3/uL — ABNORMAL LOW (ref 4.0–10.5)
nRBC: 0 % (ref 0.0–0.2)

## 2023-06-15 LAB — MAGNESIUM
Magnesium: 2.1 mg/dL (ref 1.7–2.4)
Magnesium: 1.9 mg/dL (ref 1.7–2.4)

## 2023-06-15 LAB — PHOSPHORUS
Phosphorus: 3.7 mg/dL (ref 2.5–4.6)
Phosphorus: 2.8 mg/dL (ref 2.5–4.6)

## 2023-06-15 MED ORDER — FREE WATER
175.0000 mL | Status: DC
  Administered 2023-06-15 – 2023-06-17 (×11): 175 mL

## 2023-06-15 MED ORDER — SODIUM CHLORIDE 0.9 % IV SOLN
2.0000 g | Freq: Three times a day (TID) | INTRAVENOUS | Status: DC
  Administered 2023-06-15 – 2023-06-17 (×5): 2 g via INTRAVENOUS
  Filled 2023-06-15 (×5): qty 12.5

## 2023-06-15 MED ORDER — GUAIFENESIN-DM 100-10 MG/5ML PO SYRP
5.0000 mL | ORAL_SOLUTION | ORAL | Status: DC | PRN
  Administered 2023-06-15: 5 mL via ORAL
  Filled 2023-06-15: qty 5

## 2023-06-15 MED ORDER — OSMOLITE 1.5 CAL PO LIQD
1000.0000 mL | ORAL | Status: DC
  Administered 2023-06-15 – 2023-06-17 (×3): 1000 mL
  Filled 2023-06-15 (×4): qty 1000

## 2023-06-15 NOTE — Progress Notes (Addendum)
 Initial Nutrition Assessment  DOCUMENTATION CODES:   Non-severe (moderate) malnutrition in context of chronic illness  INTERVENTION:  - TF regimen via PEG as below: Osmolite 1.5 at 65 ml/h (1560 ml per day) *Start at 7mL/hr and advance by 10mL Q12H  Prosource TF20 60 ml daily Provides 2420 kcal, 118 gm protein, 1189 ml free water daily  - + Q4H FWF ( ) to provide a total of 2254mL/day  - Monitor magnesium, potassium, and phosphorus BID for at least 3 days, MD to replete as needed, as pt is at risk for refeeding syndrome.  - Monitor weight trends.   NUTRITION DIAGNOSIS:   Moderate Malnutrition related to chronic illness, cancer and cancer related treatments as evidenced by mild fat depletion, mild muscle depletion, percent weight loss (15% in 2 months).  GOAL:   Patient will meet greater than or equal to 90% of their needs  MONITOR:   PO intake, Labs, Weight trends, TF tolerance  REASON FOR ASSESSMENT:   Consult Enteral/tube feeding initiation and management  ASSESSMENT:   53 y.o. 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  Patient reports a UBW of 156# he last weight ~1 month ago and weight loss since that time due to him not being able to eat with taste changes.  Per EMR, patient weighed at 155# in January and has since continued to drop to current weight of 131#. This is a 24# or 15% weight loss in 2 months, which is significant and severe for the time frame.  Suspect weight loss is from a combination of eating less but also not getting consistent tube feeds to meet nutritional needs given initial challenges with bolus tolerance and then frequent admissions.  Patient shares that over the past month his intake has  significantly declined as he has no taste/things taste bad so he has mostly stopped taking PO.  Still getting nutrition via his PEG. Reports the last regimen he was on at home was "whatever he is on now". However, per outpatient RD notes patient was on Nutren 1.5 (also confirmed via home meds list). He reports running this at @ 34mL/hr for 16-18 hours. This provides 1800-2025 kcals.  Of note, patient follows with the outpatient cancer center dietitians. Per their last note, patient had been tolerating 15mL/hr for 24 hours but new regimen was to increase to 53mL/hr for 18 hours.   Patient confirms this is true but reports that since he has had so much going on recently he has not been able to try increasing the rate. Has been doing cyclic hours.   Discussed that if decreasing the hours he needs to work on increasing the rate or he will not get 100% of estimated needs. Especially as he is now taking minimal PO.  Started patient on the TF protocol yesterday and TF of Osmolite 1.2 running at 49mL/hr this AM.  Will plan to keep patient on 24 hour.day feeds during admission to ensure he gets 100% of his estimated needs. Will increase slowly due to possible refeeding risk given inadequate nutrition, significant weight loss, and fat/muscle wasting. Patient agreeable to plan. Also discussed plan with MD and RN.   If admitted for several days/week can try to increase rate and try cyclic.     Medications reviewed and include: -  Labs  reviewed:  -   NUTRITION - FOCUSED PHYSICAL EXAM:  Flowsheet Row Most Recent Value  Orbital Region Mild depletion  Upper Arm Region Mild depletion  Thoracic and Lumbar Region No depletion  Buccal Region No depletion  Temple Region Mild depletion  Clavicle Bone Region Mild depletion  Clavicle and Acromion Bone Region Mild depletion  Scapular Bone Region Unable to assess  Dorsal Hand No depletion  Patellar Region Mild depletion  Anterior Thigh Region Mild depletion   Posterior Calf Region Moderate depletion  Edema (RD Assessment) None  Hair Reviewed  Eyes Reviewed  Mouth Reviewed  Skin Reviewed  Nails Reviewed       Diet Order:   Diet Order             DIET SOFT Room service appropriate? Yes; Fluid consistency: Thin  Diet effective now                   EDUCATION NEEDS:  Education needs have been addressed  Skin:  Skin Assessment: Reviewed RN Assessment  Last BM:  3/17  Height:  Ht Readings from Last 1 Encounters:  06/14/23 5\' 4"  (1.626 m)   Weight:  Wt Readings from Last 1 Encounters:  06/15/23 59.5 kg    BMI:  Body mass index is 22.52 kg/m.  Estimated Nutritional Needs:  Kcal:  2100-2400 kcals Protein:  90-120 grams Fluid:  >/= 2.1L    Shelle Iron RD, LDN Contact via Secure Chat.

## 2023-06-15 NOTE — Plan of Care (Signed)
   Problem: Nutrition: Goal: Adequate nutrition will be maintained Outcome: Progressing   Problem: Coping: Goal: Level of anxiety will decrease Outcome: Progressing   Problem: Pain Managment: Goal: General experience of comfort will improve and/or be controlled Outcome: Progressing

## 2023-06-15 NOTE — Progress Notes (Signed)
 Left lower extremity venous duplex has been completed. Preliminary results can be found in CV Proc through chart review.   06/15/23 1:05 PM Olen Cordial RVT

## 2023-06-15 NOTE — Progress Notes (Signed)
 Pharmacy Antibiotic Note  [name] is a 52 y.o. male admitted on 06/14/2023 with pneumonia.  Pharmacy has been consulted for dosing.  MRSA PCR negative ok, to DC The vancomycin. Scr has improved, CrCl is now 73 ml/min.   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  - Recent admissions 2/3-2/9 and 2/21-2/27  ID: PNA - Temp 99.4, WBC 4.2,   Vanco 3/17>> 3/18  Cefepime  3/17>>  3/17 RVP: neg  Plan: Adjust  cefepime to IV q8h     Height: 5\' 4"  (162.6 cm) Weight: 59.5 kg (131 lb 2.8 oz) IBW/kg (Calculated) : 59.2  Temp (24hrs), Avg:99.2 F (37.3 C), Min:98.3 F (36.8 C), Max:99.9 F (37.7 C)  Recent Labs  Lab 06/14/23 1058 06/15/23 0412  WBC 4.2 3.2*  CREATININE 1.14 0.97    Estimated Creatinine Clearance: 72.9 mL/min (by C-G formula based on SCr of 0.97 mg/dL).    No Known Allergies  Adalberto Cole, PharmD, BCPS 06/15/2023 10:54 AM

## 2023-06-15 NOTE — Progress Notes (Signed)
 PROGRESS NOTE    [name]  WJX:[id number] DOB: 1971-02-07 DOA: 06/14/2023 PCP: [name], MD   Brief Narrative:  This 53 y.o. 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  In the last 5 days patient has been feeling more lethargic than usual with increased cough and greenish sputum production.  Denies any fever or chills.  Yesterday cough got worse and he started vomiting so brought in the ED. He is found to have pneumonia and started on empiric antibiotics and admitted for further management.  Assessment & Plan:   Principal Problem:   HCAP (healthcare-associated pneumonia)  Healthcare acquired pneumonia: Patient presented with cough, decreased mentation than usual.  He has recent hospitalization until 2/27 for community-acquired pneumonia.   He now has recurrent cough, however no evidence of sepsis. Continue Empiric IV vancomycin and IV cefepime MRSA screen, can discontinue vancomycin if negative Continue Incentive spirometer   Squamous cell carcinoma of the supraglottis: He has recently completed chemotherapy and radiation.  He is essentially PEG tube dependent. Primary oncologist Dr. Campbell Riches added to inpatient treatment team Dietician  consultation to resume tube feeds   Right internal jugular DVT: Continue Eliquis   GERD: Continue famotidine.  Right leg pain.: Venous duplex negative for DVT.   DVT prophylaxis: Eliquis Code Status: Full code Family Communication: No family at bedside Disposition Plan:    Status is: Inpatient Remains inpatient appropriate because: Admitted for healthcare associated pneumonia    Consultants:  None  Procedures: CT chest  Antimicrobials:   Anti-infectives (From admission, onward)    Start     Dose/Rate Route Frequency Ordered Stop   06/15/23 1800  ceFEPIme (MAXIPIME) 2 g in sodium chloride 0.9 % 100 mL IVPB        2 g 200 mL/hr over 30 Minutes Intravenous Every 8 hours 06/15/23 1055     06/15/23 1200  vancomycin (VANCOREADY) IVPB 1250 mg/250 mL  Status:  Discontinued        1,250 mg 166.7 mL/hr over 90 Minutes Intravenous Every 24 hours 06/14/23 1213 06/15/23 1052   06/14/23 1045  vancomycin (VANCOREADY) IVPB 1250 mg/250 mL        1,250 mg 166.7 mL/hr over 90 Minutes Intravenous NOW 06/14/23 1038 06/14/23 1314   06/14/23 1030  cefTRIAXone (ROCEPHIN) injection 500 mg  Status:  Discontinued        500 mg Intramuscular  Once 06/14/23 1017 06/14/23 1021   06/14/23 1030  doxycycline (VIBRA-TABS) tablet 100 mg        100 mg Oral  Once 06/14/23 1017 06/14/23 1048   06/14/23 1030  cefTRIAXone (ROCEPHIN) 1 g in sodium chloride 0.9 % 100 mL IVPB  Status:  Discontinued        1 g 200 mL/hr over 30 Minutes Intravenous  Once 06/14/23 1022 06/14/23 1024   06/14/23 1030  ceFEPIme (MAXIPIME) 2 g in sodium chloride 0.9 % 100 mL IVPB  Status:  Discontinued        2 g 200 mL/hr over 30 Minutes Intravenous Every 12 hours 06/14/23 1024 06/15/23 1055   06/14/23 0000  amoxicillin (AMOXIL) 500 MG capsule        1,000 mg Oral 3 times daily 06/14/23 1017     06/14/23 0000  doxycycline (VIBRAMYCIN) 100 MG capsule  100 mg Oral 2 times daily 06/14/23 1017        Subjective: Patient was seen and examined at bedside.  Overnight events noted.   Patient reports doing better.  Denies any chest pain or shortness of breath.   Reports having pain in the right leg.  Objective: Vitals:   06/14/23 1523 06/14/23 2005 06/15/23 0446 06/15/23 1247  BP:  134/83 121/80 124/76  Pulse:  (!) 53 73 (!) 57  Resp:  17 17 16   Temp:  98.3 F (36.8 C) 99 F (37.2 C) 98.5 F (36.9 C)  TempSrc:   Oral Oral  SpO2:  97% 96% 98%  Weight: 60.4 kg  59.5 kg    Height: 5\' 4"  (1.626 m)       Intake/Output Summary (Last 24 hours) at 06/15/2023 1536 Last data filed at 06/15/2023 0700 Gross per 24 hour  Intake 815.33 ml  Output --  Net 815.33 ml   Filed Weights   06/14/23 1523 06/15/23 0446  Weight: 60.4 kg 59.5 kg    Examination:  General exam: Appears calm and comfortable, not in any acute distress. Respiratory system: CTA bilaterally . Respiratory effort normal.  RR 16 Cardiovascular system: S1 & S2 heard, RRR. No JVD, murmurs, rubs, gallops or clicks.  Gastrointestinal system: Abdomen is non distended, soft and non tender.  Normal bowel sounds heard. Central nervous system: Alert and oriented x 3. No focal neurological deficits. Extremities: Symmetric 5 x 5 power. Skin: No rashes, lesions or ulcers Psychiatry: Judgement and insight appear normal. Mood & affect appropriate.     Data Reviewed: I have personally reviewed following labs and imaging studies  CBC: Recent Labs  Lab 06/14/23 1058 06/15/23 0412  WBC 4.2 3.2*  NEUTROABS 3.0  --   HGB 8.9* 9.1*  HCT 28.0* 27.6*  MCV 94.3 92.3  PLT 201 213   Basic Metabolic Panel: Recent Labs  Lab 06/14/23 1058 06/15/23 0410 06/15/23 0412  NA 134*  --  136  K 4.3  --  4.0  CL 98  --  102  CO2 27  --  24  GLUCOSE 106*  --  120*  BUN 17  --  20  CREATININE 1.14  --  0.97  CALCIUM 8.4*  --  8.5*  MG  --  2.1  --   PHOS  --  3.7  --    GFR: Estimated Creatinine Clearance: 72.9 mL/min (by C-G formula based on SCr of 0.97 mg/dL). Liver Function Tests: No results for input(s): "AST", "ALT", "ALKPHOS", "BILITOT", "PROT", "ALBUMIN" in the last 168 hours. No results for input(s): "LIPASE", "AMYLASE" in the last 168 hours. No results for input(s): "AMMONIA" in the last 168 hours. Coagulation Profile: No results for input(s): "INR", "PROTIME" in the last 168 hours. Cardiac Enzymes: No results for input(s): "CKTOTAL", "CKMB", "CKMBINDEX", "TROPONINI" in the last 168 hours. BNP  (last 3 results) No results for input(s): "PROBNP" in the last 8760 hours. HbA1C: No results for input(s): "HGBA1C" in the last 72 hours. CBG: Recent Labs  Lab 06/14/23 2003 06/14/23 2358 06/15/23 0327 06/15/23 0748 06/15/23 1229  GLUCAP 99 86 104* 117* 126*   Lipid Profile: No results for input(s): "CHOL", "HDL", "LDLCALC", "TRIG", "CHOLHDL", "LDLDIRECT" in the last 72 hours. Thyroid Function Tests: No results for input(s): "TSH", "T4TOTAL", "FREET4", "T3FREE", "THYROIDAB" in the last 72 hours. Anemia Panel: No results for input(s): "VITAMINB12", "FOLATE", "FERRITIN", "TIBC", "IRON", "RETICCTPCT" in the last 72 hours. Sepsis Labs: No results for input(s): "PROCALCITON", "  LATICACIDVEN" in the last 168 hours.  Recent Results (from the past 240 hours)  Resp panel by RT-PCR (RSV, Flu A&B, Covid) Anterior Nasal Swab     Status: None   Collection Time: 06/14/23  6:43 AM   Specimen: Anterior Nasal Swab  Result Value Ref Range Status   SARS Coronavirus 2 by RT PCR NEGATIVE NEGATIVE Final    Comment: (NOTE) SARS-CoV-2 target nucleic acids are NOT DETECTED.  The SARS-CoV-2 RNA is generally detectable in upper respiratory specimens during the acute phase of infection. The lowest concentration of SARS-CoV-2 viral copies this assay can detect is 138 copies/mL. A negative result does not preclude SARS-Cov-2 infection and should not be used as the sole basis for treatment or other patient management decisions. A negative result may occur with  improper specimen collection/handling, submission of specimen other than nasopharyngeal swab, presence of viral mutation(s) within the areas targeted by this assay, and inadequate number of viral copies(<138 copies/mL). A negative result must be combined with clinical observations, patient history, and epidemiological information. The expected result is Negative.  Fact Sheet for Patients:  BloggerCourse.com  Fact Sheet  for Healthcare Providers:  SeriousBroker.it  This test is no t yet approved or cleared by the [place] FDA and  has been authorized for detection and/or diagnosis of SARS-CoV-2 by FDA under an Emergency Use Authorization (EUA). This EUA will remain  in effect (meaning this test can be used) for the duration of the COVID-19 declaration under Section 564(b)(1) of the Act, 21 U.S.C.section 360bbb-3(b)(1), unless the authorization is terminated  or revoked sooner.       Influenza A by PCR NEGATIVE NEGATIVE Final   Influenza B by PCR NEGATIVE NEGATIVE Final    Comment: (NOTE) The Xpert Xpress SARS-CoV-2/FLU/RSV plus assay is intended as an aid in the diagnosis of influenza from Nasopharyngeal swab specimens and should not be used as a sole basis for treatment. Nasal washings and aspirates are unacceptable for Xpert Xpress SARS-CoV-2/FLU/RSV testing.  Fact Sheet for Patients: BloggerCourse.com  Fact Sheet for Healthcare Providers: SeriousBroker.it  This test is not yet approved or cleared by the [place] FDA and has been authorized for detection and/or diagnosis of SARS-CoV-2 by FDA under an Emergency Use Authorization (EUA). This EUA will remain in effect (meaning this test can be used) for the duration of the COVID-19 declaration under Section 564(b)(1) of the Act, 21 U.S.C. section 360bbb-3(b)(1), unless the authorization is terminated or revoked.     Resp Syncytial Virus by PCR NEGATIVE NEGATIVE Final    Comment: (NOTE) Fact Sheet for Patients: BloggerCourse.com  Fact Sheet for Healthcare Providers: SeriousBroker.it  This test is not yet approved or cleared by the [place] FDA and has been authorized for detection and/or diagnosis of SARS-CoV-2 by FDA under an Emergency Use Authorization (EUA). This EUA will remain in effect (meaning  this test can be used) for the duration of the COVID-19 declaration under Section 564(b)(1) of the Act, 21 U.S.C. section 360bbb-3(b)(1), unless the authorization is terminated or revoked.  Performed at [facility], [address] [place], [place] [address]   MRSA Next Gen by PCR, Nasal     Status: None   Collection Time: 06/14/23 11:45 AM   Specimen: Nasal Mucosa; Nasal Swab  Result Value Ref Range Status   MRSA by PCR Next Gen NOT DETECTED NOT DETECTED Final    Comment: (NOTE) The GeneXpert MRSA Assay (FDA approved for NASAL specimens only), is one component of a  comprehensive MRSA colonization surveillance program. It is not intended to diagnose MRSA infection nor to guide or monitor treatment for MRSA infections. Test performance is not FDA approved in patients less than 65 years old. Performed at [facility], [address] [place], [place] [address]     Radiology Studies: VAS [place] LOWER EXTREMITY VENOUS (DVT) Result Date: 06/15/2023  Lower Venous DVT Study Patient Name:  [name]  Date of Exam:   06/15/2023 Medical Rec #: [id number]       Accession #:    [id number] Date of Birth: Jul 21, 1970      Patient Gender: M Patient Age:   33 years Exam Location:  [facility] Procedure:      VAS [place] LOWER EXTREMITY VENOUS (DVT) Referring Phys: Yarnell Arvidson KHATRI --------------------------------------------------------------------------------  Indications: Pain.  Risk Factors: Cancer. Comparison Study: No prior studies. Performing Technologist: Chanda Busing RVT  Examination Guidelines: A complete evaluation includes B-mode imaging, spectral Doppler, color Doppler, and power Doppler as needed of all accessible portions of each vessel. Bilateral testing is considered an integral part of a complete examination. Limited examinations for reoccurring indications may be performed as noted. The reflux portion of the exam is performed with the patient in  reverse Trendelenburg.  +-----+---------------+---------+-----------+----------+--------------+ RIGHTCompressibilityPhasicitySpontaneityPropertiesThrombus Aging +-----+---------------+---------+-----------+----------+--------------+ CFV  Full           Yes      Yes                                 +-----+---------------+---------+-----------+----------+--------------+   +---------+---------------+---------+-----------+----------+--------------+ LEFT     CompressibilityPhasicitySpontaneityPropertiesThrombus Aging +---------+---------------+---------+-----------+----------+--------------+ CFV      Full           Yes      Yes                                 +---------+---------------+---------+-----------+----------+--------------+ SFJ      Full                                                        +---------+---------------+---------+-----------+----------+--------------+ FV Prox  Full                                                        +---------+---------------+---------+-----------+----------+--------------+ FV Mid   Full                                                        +---------+---------------+---------+-----------+----------+--------------+ FV DistalFull                                                        +---------+---------------+---------+-----------+----------+--------------+ PFV      Full                                                        +---------+---------------+---------+-----------+----------+--------------+  POP      Full           Yes      Yes                                 +---------+---------------+---------+-----------+----------+--------------+ PTV      Full                                                        +---------+---------------+---------+-----------+----------+--------------+ PERO     Full                                                         +---------+---------------+---------+-----------+----------+--------------+     Summary: RIGHT: - No evidence of common femoral vein obstruction.   LEFT: - There is no evidence of deep vein thrombosis in the lower extremity.  - No cystic structure found in the popliteal fossa.  *See table(s) above for measurements and observations.    Preliminary    DG Chest 2 View Result Date: 06/14/2023 CLINICAL DATA:  Productive cough for several days. Laryngeal carcinoma. EXAM: CHEST - 2 VIEW COMPARISON:  05/21/2023 FINDINGS: The heart size and mediastinal contours are within normal limits. Right-sided Port-A-Cath remains in place. New airspace disease is seen in the inferior right middle lobe, suspicious for pneumonia. Left lung is clear. No pleural effusion. The visualized skeletal structures are unremarkable. IMPRESSION: New right middle lobe infiltrate, suspicious for pneumonia. Recommend continued chest radiographic follow-up to confirm resolution. Electronically Signed   By: Danae Orleans M.D.   On: 06/14/2023 09:01   Scheduled Meds:  apixaban  5 mg Oral BID   Chlorhexidine Gluconate Cloth  6 each Topical Daily   famotidine  40 mg Oral QHS   feeding supplement (PROSource TF20)  60 mL Per Tube Daily   free water  175 mL Per Tube Q4H   metoprolol tartrate  12.5 mg Oral BID   QUEtiapine  100 mg Oral QHS   sucralfate  1 g Per Tube TID WC & HS   Continuous Infusions:  ceFEPime (MAXIPIME) IV     feeding supplement (OSMOLITE 1.5 CAL)       LOS: 1 day    Time spent: 50 mins    Willeen Niece, MD [facility]   If 7PM-7AM, please contact night-coverage

## 2023-06-15 NOTE — Plan of Care (Signed)
   Problem: Health Behavior/Discharge Planning: Goal: Ability to manage health-related needs will improve Outcome: Progressing   Problem: Clinical Measurements: Goal: Ability to maintain clinical measurements within normal limits will improve Outcome: Progressing

## 2023-06-15 NOTE — Progress Notes (Signed)
   06/15/23 0911  TOC Brief Assessment  Insurance and Status Reviewed  Patient has primary care physician Yes  Home environment has been reviewed form home with family  Prior level of function: Mod independence  Prior/Current Home Services No current home services  Social Drivers of Health Review SDOH reviewed no interventions necessary  Readmission risk has been reviewed Yes  Transition of care needs transition of care needs identified, TOC will continue to follow

## 2023-06-15 NOTE — Progress Notes (Signed)
 Pt wife called to request that pt left knee pain be evaluated due to pt PMH. MD ordered vascular ultrasound.

## 2023-06-16 DIAGNOSIS — J189 Pneumonia, unspecified organism: Secondary | ICD-10-CM | POA: Diagnosis not present

## 2023-06-16 LAB — GLUCOSE, CAPILLARY
Glucose-Capillary: 118 mg/dL — ABNORMAL HIGH (ref 70–99)
Glucose-Capillary: 128 mg/dL — ABNORMAL HIGH (ref 70–99)
Glucose-Capillary: 127 mg/dL — ABNORMAL HIGH (ref 70–99)
Glucose-Capillary: 129 mg/dL — ABNORMAL HIGH (ref 70–99)
Glucose-Capillary: 130 mg/dL — ABNORMAL HIGH (ref 70–99)

## 2023-06-16 LAB — PHOSPHORUS: Phosphorus: 3.3 mg/dL (ref 2.5–4.6)

## 2023-06-16 LAB — MAGNESIUM: Magnesium: 2 mg/dL (ref 1.7–2.4)

## 2023-06-16 NOTE — Plan of Care (Signed)
  Problem: Nutrition: Goal: Adequate nutrition will be maintained Outcome: Progressing   Problem: Pain Managment: Goal: General experience of comfort will improve and/or be controlled Outcome: Progressing

## 2023-06-16 NOTE — Progress Notes (Signed)
 PROGRESS NOTE    [name]  UEA:[id number] DOB: Aug 27, 1970 DOA: 06/14/2023 PCP: [name], MD  Chief Complaint  Patient presents with   Cough    Hospital Course:  [name] is 53 y.o. 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  He is now being admitted to the hospital with recurrent pneumonia.  Over the 5 days prior to arrival patient endorses increasing lethargy, cough, and sputum production.  He was admitted and initiated on broad-spectrum antibiotics.   Subjective: No acute events overnight. On evaluation today patient reports he is feeling somewhat better. Endorses some nausea, vomiting with PO intake.  Objective: Vitals:   06/15/23 1247 06/15/23 1931 06/16/23 0500 06/16/23 0843  BP: 124/76 129/76 116/68 126/76  Pulse: (!) 57 75 (!) 58 (!) 56  Resp: 16 16 16 16   Temp: 98.5 F (36.9 C) 100 F (37.8 C) 98.2 F (36.8 C)   TempSrc: Oral Oral    SpO2: 98% 96% 99% 96%  Weight:   62.3 kg   Height:        Intake/Output Summary (Last 24 hours) at 06/16/2023 0857 Last data filed at 06/16/2023 0730 Gross per 24 hour  Intake 1806.33 ml  Output --  Net 1806.33 ml   Filed Weights   06/14/23 1523 06/15/23 0446 06/16/23 0500  Weight: 60.4 kg 59.5 kg 62.3 kg    Examination: General exam: Appears calm and comfortable, NAD  Respiratory system: No work of breathing, symmetric chest wall expansion Cardiovascular system: S1 & S2 heard, RRR.  Gastrointestinal system: Abdomen is nondistended, soft and nontender. PEG in place. Neuro: Alert and oriented. No focal neurological deficits. Extremities: Symmetric, expected ROM Skin: No rashes, lesions Psychiatry: Demonstrates appropriate judgement and insight. Mood & affect appropriate for situation.   Assessment & Plan:  Principal  Problem:   HCAP (healthcare-associated pneumonia)   Healthcare acquired pneumonia - Recent hospitalization until 2/27 for community-acquired pneumonia - Was initiated on broad-spectrum antibiotics on arrival. Currently on empiric vancomycin and cefepime - MRSA PCR: Negative.  Will discontinue vancomycin. - WBC unreliable given recent chemotherapy.  Currently 3.2. - Follow fever curve - Respiratory viral panel negative - Consider repeat chest x-ray to confirm complete resolution of right middle lobe infiltrate - Encourage incentive spirometry and flutter valve  Squamous of carcinoma of the supraglottis - Status post chemotherapy and radiation - PEG tube dependent - Primary oncologist Dr. 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  Continue  GERD - Continue Pepcid  Right leg pain - Dopplers negative for DVT  Polysubstance abuse - Has been previously counseled on cessation  Hypertension - Resume home meds    DVT prophylaxis: Eliquis   Code Status: Full Code Family Communication:  Discussed directly with patient Disposition:  Inpatient still hospitalized for IV abx, will discharge to home when stabilized. Anticipate within 48 hrs.  Consultants:  Treatment Team:  Consulting Physician: Meryl Crutch, MD  Procedures:    Antimicrobials:  Anti-infectives (From admission, onward)    Start     Dose/Rate Route Frequency Ordered Stop   06/15/23 1800  ceFEPIme (MAXIPIME) 2 g in sodium chloride 0.9 % 100 mL IVPB        2 g 200 mL/hr over 30 Minutes Intravenous Every 8 hours 06/15/23 1055  06/15/23 1200  vancomycin (VANCOREADY) IVPB 1250 mg/250 mL  Status:  Discontinued        1,250 mg 166.7 mL/hr over 90 Minutes Intravenous Every 24 hours 06/14/23 1213 06/15/23 1052   06/14/23 1045  vancomycin (VANCOREADY) IVPB 1250 mg/250 mL        1,250 mg 166.7 mL/hr over 90 Minutes Intravenous NOW 06/14/23  1038 06/14/23 1314   06/14/23 1030  cefTRIAXone (ROCEPHIN) injection 500 mg  Status:  Discontinued        500 mg Intramuscular  Once 06/14/23 1017 06/14/23 1021   06/14/23 1030  doxycycline (VIBRA-TABS) tablet 100 mg        100 mg Oral  Once 06/14/23 1017 06/14/23 1048   06/14/23 1030  cefTRIAXone (ROCEPHIN) 1 g in sodium chloride 0.9 % 100 mL IVPB  Status:  Discontinued        1 g 200 mL/hr over 30 Minutes Intravenous  Once 06/14/23 1022 06/14/23 1024   06/14/23 1030  ceFEPIme (MAXIPIME) 2 g in sodium chloride 0.9 % 100 mL IVPB  Status:  Discontinued        2 g 200 mL/hr over 30 Minutes Intravenous Every 12 hours 06/14/23 1024 06/15/23 1055   06/14/23 0000  amoxicillin (AMOXIL) 500 MG capsule        1,000 mg Oral 3 times daily 06/14/23 1017     06/14/23 0000  doxycycline (VIBRAMYCIN) 100 MG capsule        100 mg Oral 2 times daily 06/14/23 1017         Data Reviewed: I have personally reviewed following labs and imaging studies CBC: Recent Labs  Lab 06/14/23 1058 06/15/23 0412  WBC 4.2 3.2*  NEUTROABS 3.0  --   HGB 8.9* 9.1*  HCT 28.0* 27.6*  MCV 94.3 92.3  PLT 201 213   Basic Metabolic Panel: Recent Labs  Lab 06/14/23 1058 06/15/23 0410 06/15/23 0412 06/15/23 1824 06/16/23 0415  NA 134*  --  136  --   --   K 4.3  --  4.0  --   --   CL 98  --  102  --   --   CO2 27  --  24  --   --   GLUCOSE 106*  --  120*  --   --   BUN 17  --  20  --   --   CREATININE 1.14  --  0.97  --   --   CALCIUM 8.4*  --  8.5*  --   --   MG  --  2.1  --  1.9 2.0  PHOS  --  3.7  --  2.8 3.3   GFR: Estimated Creatinine Clearance: 72.9 mL/min (by C-G formula based on SCr of 0.97 mg/dL). Liver Function Tests: No results for input(s): "AST", "ALT", "ALKPHOS", "BILITOT", "PROT", "ALBUMIN" in the last 168 hours. CBG: Recent Labs  Lab 06/15/23 1646 06/15/23 1932 06/15/23 2336 06/16/23 0352 06/16/23 0730  GLUCAP 119* 113* 117* 118* 128*    Recent Results (from the past 240 hours)   Resp panel by RT-PCR (RSV, Flu A&B, Covid) Anterior Nasal Swab     Status: None   Collection Time: 06/14/23  6:43 AM   Specimen: Anterior Nasal Swab  Result Value Ref Range Status   SARS Coronavirus 2 by RT PCR NEGATIVE NEGATIVE Final    Comment: (NOTE) SARS-CoV-2 target nucleic acids are NOT DETECTED.  The SARS-CoV-2 RNA is generally detectable in upper respiratory specimens during  the acute phase of infection. The lowest concentration of SARS-CoV-2 viral copies this assay can detect is 138 copies/mL. A negative result does not preclude SARS-Cov-2 infection and should not be used as the sole basis for treatment or other patient management decisions. A negative result may occur with  improper specimen collection/handling, submission of specimen other than nasopharyngeal swab, presence of viral mutation(s) within the areas targeted by this assay, and inadequate number of viral copies(<138 copies/mL). A negative result must be combined with clinical observations, patient history, and epidemiological information. The expected result is Negative.  Fact Sheet for Patients:  BloggerCourse.com  Fact Sheet for Healthcare Providers:  SeriousBroker.it  This test is no t yet approved or cleared by the [place] FDA and  has been authorized for detection and/or diagnosis of SARS-CoV-2 by FDA under an Emergency Use Authorization (EUA). This EUA will remain  in effect (meaning this test can be used) for the duration of the COVID-19 declaration under Section 564(b)(1) of the Act, 21 U.S.C.section 360bbb-3(b)(1), unless the authorization is terminated  or revoked sooner.       Influenza A by PCR NEGATIVE NEGATIVE Final   Influenza B by PCR NEGATIVE NEGATIVE Final    Comment: (NOTE) The Xpert Xpress SARS-CoV-2/FLU/RSV plus assay is intended as an aid in the diagnosis of influenza from Nasopharyngeal swab specimens and should not be used  as a sole basis for treatment. Nasal washings and aspirates are unacceptable for Xpert Xpress SARS-CoV-2/FLU/RSV testing.  Fact Sheet for Patients: BloggerCourse.com  Fact Sheet for Healthcare Providers: SeriousBroker.it  This test is not yet approved or cleared by the [place] FDA and has been authorized for detection and/or diagnosis of SARS-CoV-2 by FDA under an Emergency Use Authorization (EUA). This EUA will remain in effect (meaning this test can be used) for the duration of the COVID-19 declaration under Section 564(b)(1) of the Act, 21 U.S.C. section 360bbb-3(b)(1), unless the authorization is terminated or revoked.     Resp Syncytial Virus by PCR NEGATIVE NEGATIVE Final    Comment: (NOTE) Fact Sheet for Patients: BloggerCourse.com  Fact Sheet for Healthcare Providers: SeriousBroker.it  This test is not yet approved or cleared by the [place] FDA and has been authorized for detection and/or diagnosis of SARS-CoV-2 by FDA under an Emergency Use Authorization (EUA). This EUA will remain in effect (meaning this test can be used) for the duration of the COVID-19 declaration under Section 564(b)(1) of the Act, 21 U.S.C. section 360bbb-3(b)(1), unless the authorization is terminated or revoked.  Performed at [facility], [address] [place], [place] [address]   MRSA Next Gen by PCR, Nasal     Status: None   Collection Time: 06/14/23 11:45 AM   Specimen: Nasal Mucosa; Nasal Swab  Result Value Ref Range Status   MRSA by PCR Next Gen NOT DETECTED NOT DETECTED Final    Comment: (NOTE) The GeneXpert MRSA Assay (FDA approved for NASAL specimens only), is one component of a comprehensive MRSA colonization surveillance program. It is not intended to diagnose MRSA infection nor to guide or monitor treatment for MRSA infections. Test performance  is not FDA approved in patients less than 47 years old. Performed at [facility], [address] [place], [place] [address]      Radiology Studies: VAS [place] LOWER EXTREMITY VENOUS (DVT) Result Date: 06/15/2023  Lower Venous DVT Study Patient Name:  ERASMUS BISTLINE  Date of Exam:   06/15/2023 Medical Rec #: [id number]  Accession #:    [id number] Date of Birth: 1970/06/25      Patient Gender: M Patient Age:   16 years Exam Location:  [facility] Procedure:      VAS [place] LOWER EXTREMITY VENOUS (DVT) Referring Phys: PARDEEP KHATRI --------------------------------------------------------------------------------  Indications: Pain.  Risk Factors: Cancer. Comparison Study: No prior studies. Performing Technologist: Chanda Busing RVT  Examination Guidelines: A complete evaluation includes B-mode imaging, spectral Doppler, color Doppler, and power Doppler as needed of all accessible portions of each vessel. Bilateral testing is considered an integral part of a complete examination. Limited examinations for reoccurring indications may be performed as noted. The reflux portion of the exam is performed with the patient in reverse Trendelenburg.  +-----+---------------+---------+-----------+----------+--------------+ RIGHTCompressibilityPhasicitySpontaneityPropertiesThrombus Aging +-----+---------------+---------+-----------+----------+--------------+ CFV  Full           Yes      Yes                                 +-----+---------------+---------+-----------+----------+--------------+   +---------+---------------+---------+-----------+----------+--------------+ LEFT     CompressibilityPhasicitySpontaneityPropertiesThrombus Aging +---------+---------------+---------+-----------+----------+--------------+ CFV      Full           Yes      Yes                                 +---------+---------------+---------+-----------+----------+--------------+ SFJ      Full                                                         +---------+---------------+---------+-----------+----------+--------------+ FV Prox  Full                                                        +---------+---------------+---------+-----------+----------+--------------+ FV Mid   Full                                                        +---------+---------------+---------+-----------+----------+--------------+ FV DistalFull                                                        +---------+---------------+---------+-----------+----------+--------------+ PFV      Full                                                        +---------+---------------+---------+-----------+----------+--------------+ POP      Full           Yes      Yes                                 +---------+---------------+---------+-----------+----------+--------------+  PTV      Full                                                        +---------+---------------+---------+-----------+----------+--------------+ PERO     Full                                                        +---------+---------------+---------+-----------+----------+--------------+    Summary: RIGHT: - No evidence of common femoral vein obstruction.   LEFT: - There is no evidence of deep vein thrombosis in the lower extremity.  - No cystic structure found in the popliteal fossa.  *See table(s) above for measurements and observations. Electronically signed by Lemar Livings MD on 06/15/2023 at 3:52:51 PM.    Final     Scheduled Meds:  apixaban  5 mg Oral BID   Chlorhexidine Gluconate Cloth  6 each Topical Daily   famotidine  40 mg Oral QHS   feeding supplement (PROSource TF20)  60 mL Per Tube Daily   free water  175 mL Per Tube Q4H   metoprolol tartrate  12.5 mg Oral BID   QUEtiapine  100 mg Oral QHS   sucralfate  1 g Per Tube TID WC & HS   Continuous Infusions:  ceFEPime (MAXIPIME) IV 2 g (06/16/23 0149)    feeding supplement (OSMOLITE 1.5 CAL) 1,000 mL (06/15/23 1500)     LOS: 2 days  MDM: Patient is high risk for one or more organ failure.  They necessitate ongoing hospitalization for continued IV therapies and subsequent lab monitoring. Total time spent interpreting labs and vitals, coordinating care amongst consultants and care team members, directly assessing and discussing care with the patient and/or family: 55 min    Debarah Crape, DO [facility]  To contact the attending physician between 7A-7P please use Epic Chat. To contact the covering physician during after hours 7P-7A, please review Amion.   06/16/2023, 8:57 AM   *This document has been created with the assistance of dictation software. Please excuse typographical errors. *

## 2023-06-16 NOTE — Plan of Care (Signed)

## 2023-06-17 ENCOUNTER — Inpatient Hospital Stay (HOSPITAL_COMMUNITY)

## 2023-06-17 DIAGNOSIS — C321 Malignant neoplasm of supraglottis: Secondary | ICD-10-CM

## 2023-06-17 DIAGNOSIS — J189 Pneumonia, unspecified organism: Secondary | ICD-10-CM | POA: Diagnosis not present

## 2023-06-17 LAB — CBC WITH DIFFERENTIAL/PLATELET
Abs Immature Granulocytes: 0 10*3/uL (ref 0.00–0.07)
Basophils Absolute: 0 10*3/uL (ref 0.0–0.1)
Basophils Relative: 0 %
Eosinophils Absolute: 0 10*3/uL (ref 0.0–0.5)
Eosinophils Relative: 0 %
HCT: 26.6 % — ABNORMAL LOW (ref 39.0–52.0)
Hemoglobin: 8.3 g/dL — ABNORMAL LOW (ref 13.0–17.0)
Lymphocytes Relative: 8 %
Lymphs Abs: 0.2 10*3/uL — ABNORMAL LOW (ref 0.7–4.0)
MCH: 29.5 pg (ref 26.0–34.0)
MCHC: 31.2 g/dL (ref 30.0–36.0)
MCV: 94.7 fL (ref 80.0–100.0)
Monocytes Absolute: 0.3 10*3/uL (ref 0.1–1.0)
Monocytes Relative: 11 %
Neutro Abs: 2.3 10*3/uL (ref 1.7–7.7)
Neutrophils Relative %: 81 %
Platelets: 256 10*3/uL (ref 150–400)
RBC: 2.81 MIL/uL — ABNORMAL LOW (ref 4.22–5.81)
RDW: 15.5 % (ref 11.5–15.5)
WBC: 2.9 10*3/uL — ABNORMAL LOW (ref 4.0–10.5)
nRBC: 0 % (ref 0.0–0.2)

## 2023-06-17 LAB — GLUCOSE, CAPILLARY
Glucose-Capillary: 94 mg/dL (ref 70–99)
Glucose-Capillary: 123 mg/dL — ABNORMAL HIGH (ref 70–99)
Glucose-Capillary: 103 mg/dL — ABNORMAL HIGH (ref 70–99)
Glucose-Capillary: 104 mg/dL — ABNORMAL HIGH (ref 70–99)

## 2023-06-17 LAB — COMPREHENSIVE METABOLIC PANEL
ALT: 8 U/L (ref 0–44)
AST: 11 U/L — ABNORMAL LOW (ref 15–41)
Albumin: 2.5 g/dL — ABNORMAL LOW (ref 3.5–5.0)
Alkaline Phosphatase: 46 U/L (ref 38–126)
Anion gap: 7 (ref 5–15)
BUN: 17 mg/dL (ref 6–20)
CO2: 25 mmol/L (ref 22–32)
Calcium: 8.2 mg/dL — ABNORMAL LOW (ref 8.9–10.3)
Chloride: 102 mmol/L (ref 98–111)
Creatinine, Ser: 0.92 mg/dL (ref 0.61–1.24)
GFR, Estimated: >60 mL/min (ref >60)
Glucose, Bld: 117 mg/dL — ABNORMAL HIGH (ref 70–99)
Potassium: 4.3 mmol/L (ref 3.5–5.1)
Sodium: 134 mmol/L — ABNORMAL LOW (ref 135–145)
Total Bilirubin: 0.3 mg/dL (ref 0.0–1.2)
Total Protein: 6.1 g/dL — ABNORMAL LOW (ref 6.5–8.1)

## 2023-06-17 LAB — MAGNESIUM: Magnesium: 2 mg/dL (ref 1.7–2.4)

## 2023-06-17 LAB — PHOSPHORUS: Phosphorus: 3.3 mg/dL (ref 2.5–4.6)

## 2023-06-17 MED ORDER — HEPARIN SOD (PORK) LOCK FLUSH 100 UNIT/ML IV SOLN
500.0000 [IU] | INTRAVENOUS | Status: AC | PRN
  Administered 2023-06-17: 500 [IU]

## 2023-06-17 MED ORDER — PANTOPRAZOLE SODIUM 40 MG PO TBEC
40.0000 mg | DELAYED_RELEASE_TABLET | Freq: Two times a day (BID) | ORAL | Status: DC
  Administered 2023-06-17: 40 mg via ORAL
  Filled 2023-06-17: qty 1

## 2023-06-17 MED ORDER — APIXABAN 5 MG PO TABS: ORAL_TABLET | ORAL | 0 refills | Status: AC

## 2023-06-17 MED ORDER — AMOXICILLIN-POT CLAVULANATE 875-125 MG PO TABS
1.0000 | ORAL_TABLET | Freq: Two times a day (BID) | ORAL | Status: DC
  Administered 2023-06-17: 1 via ORAL
  Filled 2023-06-17: qty 1

## 2023-06-17 MED ORDER — AMOXICILLIN-POT CLAVULANATE 875-125 MG PO TABS: 1.0000 | ORAL_TABLET | Freq: Two times a day (BID) | ORAL | 0 refills | Status: AC

## 2023-06-17 NOTE — Plan of Care (Signed)
  Problem: Education: Goal: Knowledge of General Education information will improve Description: Including pain rating scale, medication(s)/side effects and non-pharmacologic comfort measures Outcome: Adequate for Discharge   Problem: Health Behavior/Discharge Planning: Goal: Ability to manage health-related needs will improve Outcome: Adequate for Discharge   Problem: Clinical Measurements: Goal: Ability to maintain clinical measurements within normal limits will improve Outcome: Adequate for Discharge Goal: Will remain free from infection Outcome: Adequate for Discharge Goal: Diagnostic test results will improve Outcome: Adequate for Discharge Goal: Respiratory complications will improve Outcome: Adequate for Discharge Goal: Cardiovascular complication will be avoided Outcome: Adequate for Discharge   Problem: Activity: Goal: Risk for activity intolerance will decrease Outcome: Adequate for Discharge   Problem: Nutrition: Goal: Adequate nutrition will be maintained Outcome: Adequate for Discharge   Problem: Coping: Goal: Level of anxiety will decrease Outcome: Adequate for Discharge   Problem: Elimination: Goal: Will not experience complications related to bowel motility Outcome: Adequate for Discharge Goal: Will not experience complications related to urinary retention Outcome: Adequate for Discharge   Problem: Pain Managment: Goal: General experience of comfort will improve and/or be controlled Outcome: Adequate for Discharge

## 2023-06-17 NOTE — Progress Notes (Signed)
 Nutrition Follow-up  DOCUMENTATION CODES:   Non-severe (moderate) malnutrition in context of chronic illness  INTERVENTION:  - TF regimen during admission as below: Osmolite 1.5 at 65 ml/h (1560 ml per day) Prosource TF20 60 ml daily + Q4H FWF ( )  Provides 2420 kcal, 118 gm protein, 2239 ml free water daily  - Patient to discharge home today. Patient to try and work towards cyclic tube feeds once discharged as recommended by outpatient cancer RD.  - Goal TF regimen at home: Osmolite 1.5 @ 30mL/hr x18 hours + FWF QID  - Discussed with patient can slowly transition there as tolerated. Consider:  Osmolite 1.5 @ 60mL/hr for 22 hours  Osmolite 1.5 @ 85mL for 20 hours  Osmolite 1.5 @ 43mL/hr for 18 hours (GOAL)   NUTRITION DIAGNOSIS:   Moderate Malnutrition related to chronic illness, cancer and cancer related treatments as evidenced by mild fat depletion, mild muscle depletion, percent weight loss (15% in 2 months). *ongoing  GOAL:   Patient will meet greater than or equal to 90% of their needs *met with TF  MONITOR:   PO intake, Labs, Weight trends, TF tolerance  REASON FOR ASSESSMENT:   Consult Enteral/tube feeding initiation and management  ASSESSMENT:   53 y.o. 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  Patient reports tolerating continuous goal of 55mL/hr well with not intolerances.  He has not been eating anything due to continued taste changes.  Patient reports plan to discharge home today. Discussed trying to transition to cyclic tube feeds as previously recommended by outpatient cancer RD's following patient. Discussed can transition slowly as tolerated and provided day by day examples to increase  rate/decrease run time. Patient endorsed understanding. No questions or concerns.   Admit weight: 133# Current weight: 134# I&O's: +3.6L  Medications reviewed and include: Q4H FWF  Labs reviewed:  Na 134  Diet Order:   Diet Order             Diet general           DIET SOFT Room service appropriate? Yes; Fluid consistency: Thin  Diet effective now                   EDUCATION NEEDS:  Education needs have been addressed  Skin:  Skin Assessment: Reviewed RN Assessment  Last BM:  3/20  Height:  Ht Readings from Last 1 Encounters:  06/14/23 5\' 4"  (1.626 m)   Weight:  Wt Readings from Last 1 Encounters:  06/17/23 61.2 kg    BMI:  Body mass index is 23.16 kg/m.  Estimated Nutritional Needs:  Kcal:  2100-2400 kcals Protein:  90-120 grams Fluid:  >/= 2.1L    Shelle Iron RD, LDN Contact via Secure Chat.

## 2023-06-17 NOTE — Hospital Course (Signed)
 [name] is 53 y.o. 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 for pneumonia and sepsis.  He is now being admitted to the hospital with recurrent pneumonia.  Over the 5 days prior to arrival patient endorses increasing lethargy, cough, and sputum production.  He was admitted and initiated on broad-spectrum antibiotics. patient was recently admitted 3 weeks prior for community-acquired pneumonia and thus there was concern for MRSA pneumonia at this time.  Ultimately MRSA PCR was negative and vancomycin was able to be discontinued.  Patient did well on just cefepime which was eventually de-escalated to Augmentin.  He had no further fevers.  Respiratory viral panel was negative.  Ultimately patient will need to follow-up with primary care to confirm resolution of right middle lobe infiltrate. Given patient has been gradually increasing his p.o. intake and has new right-sided pneumonia we requested for SLP evaluation to rule out aspiration.  Patient underwent modified barium swallow which was mostly unremarkable.  He had no evidence of aspiration, and did well with all liquids.  We resumed his GERD treatment. By 3/20 patient reported feeling back to baseline and ready to discharge home.  He will need to follow-up with primary care in the clinic next week and complete his course of Augmentin outpatient.   Healthcare acquired bacterial pneumonia (unknown bacteria) - Recent hospitalization until 2/27 for community-acquired pneumonia.  Initiated on cefepime and vancomycin on arrival - MRSA PCR negative, vancomycin discontinued - Cefepime transition to Augmentin.  Doing well - WBC unreliable given recent chemotherapy.   -Clinically improved, no fever - Respiratory viral panel negative - Consider repeat chest x-ray to confirm complete  resolution of right middle lobe infiltrate - Encourage incentive spirometry and flutter valve   Squamous of carcinoma of the supraglottis PEG tube dependent - Status post chemotherapy and radiation - PEG tube dependent - Primary oncologist Dr. 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  Continue   GERD - Continue Pepcid   Right leg pain - Dopplers negative for DVT   Polysubstance abuse - Has been previously counseled on cessation   Hypertension - Resume home meds   Moderate malnutrition      -- 2/2 cancer and cancer related treatments as evidenced by mild --dietitian was consulted.  Continue tube feeds as above

## 2023-06-17 NOTE — Progress Notes (Signed)
 Pt discharge orders read and reviewed. Pt verbalized understanding. Pt escorted via wheelchair to main entrance to spouse in personal vehicle.

## 2023-06-17 NOTE — Discharge Summary (Signed)
 Physician Discharge Summary   Patient: [name] MRN: [id number] DOB: November 12, 1970  Admit date:     06/14/2023  Discharge date: 06/17/23  Discharge Physician: Debarah Crape   PCP: [name], MD   Recommendations at discharge:   Follow-up with oncology for continued cancer treatment and surveillance Follow-up with primary care physician in 1 week to repeat chest x-ray and confirm complete resolution of right middle lobe infiltrate  Discharge Diagnoses: Principal Problem:   HCAP (healthcare-associated pneumonia)  Resolved Problems:   * No resolved hospital problems. *  Hospital Course: [name] is 53 y.o. 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  He is now being admitted to the hospital with recurrent pneumonia.  Over the 5 days prior to arrival patient endorses increasing lethargy, cough, and sputum production.  He was admitted and initiated on broad-spectrum antibiotics. patient was recently admitted 3 weeks prior for community-acquired pneumonia and thus there was concern for MRSA pneumonia at this time.  Ultimately MRSA PCR was negative and vancomycin was able to be discontinued.  Patient did well on just cefepime which was eventually de-escalated to Augmentin.  He had no further fevers.  Respiratory viral panel was negative.  Ultimately patient will need to follow-up with primary care to confirm resolution of right middle lobe infiltrate. Given patient has been gradually increasing his p.o. intake and has new right-sided pneumonia we requested for SLP evaluation to rule out aspiration.  Patient underwent modified barium swallow which was mostly unremarkable.  He had no evidence of aspiration, and did well with all liquids.  We resumed his GERD treatment. By 3/20 patient reported  feeling back to baseline and ready to discharge home.  He will need to follow-up with primary care in the clinic next week and complete his course of Augmentin outpatient.   Healthcare acquired bacterial pneumonia (unknown bacteria) - Recent hospitalization until 2/27 for community-acquired pneumonia.  Initiated on cefepime and vancomycin on arrival - MRSA PCR negative, vancomycin discontinued - Cefepime transition to Augmentin.  Doing well - WBC unreliable given recent chemotherapy.   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  Continue   GERD - Continue Pepcid   Right leg pain - Dopplers negative for DVT   Polysubstance abuse - Has been previously counseled on cessation   Hypertension - Resume home meds   Moderate malnutrition      -- 2/2 cancer and cancer related treatments as evidenced by mild --dietitian was consulted.  Continue tube feeds as above       Diet recommendation:  Discharge Diet Orders (From admission, onward)     Start     Ordered   06/17/23 0000  Diet general        06/17/23 1342           Regular diet DISCHARGE MEDICATION: Allergies as of 06/17/2023   No Known Allergies      Medication List     TAKE these medications  acetaminophen 500 MG tablet Commonly known as: TYLENOL Take 1,000 mg by mouth at bedtime as needed for mild pain (pain score 1-3) or moderate pain (pain score 4-6).   amoxicillin-clavulanate 875-125 MG tablet Commonly known as: AUGMENTIN Take 1 tablet by mouth 2 (two) times daily for 5 days.   apixaban 5  MG Tabs tablet Commonly known as: ELIQUIS Take 5 mg by mouth in the morning and at bedtime What changed: See the new instructions.   dexamethasone 4 MG tablet Commonly known as: DECADRON Take 2 tablets (8 mg) by mouth daily x 3 days starting the day after cisplatin chemotherapy. Take with food.   famotidine 40 MG tablet Commonly known as: Pepcid Take 1 tablet (40 mg total) by mouth at bedtime.   feeding supplement (PROSource TF20) liquid Place 60 mLs into feeding tube daily.   FLUoxetine 20 MG capsule Commonly known as: PROZAC Take 1 capsule (20 mg total) by mouth daily.   guaiFENesin-dextromethorphan 100-10 MG/5ML syrup Commonly known as: ROBITUSSIN DM Place 15 mLs into feeding tube every 6 (six) hours. What changed: how to take this   hydrOXYzine 25 MG tablet Commonly known as: ATARAX Take 1 tablet (25 mg total) by mouth 3 (three) times daily as needed for anxiety. What changed: when to take this   lidocaine 2 % solution Commonly known as: XYLOCAINE Patient: Mix 1part 2% viscous lidocaine, 1part H20. Swallow 10mL of diluted mixture, before meals and at bedtime, up to QID   lidocaine-prilocaine cream Commonly known as: EMLA Apply to affected area once What changed:  how much to take how to take this when to take this reasons to take this additional instructions   metoprolol tartrate 25 MG tablet Commonly known as: LOPRESSOR Take 0.5 tablets (12.5 mg total) by mouth 2 (two) times daily.   Nutren 1.5 Liqd 24ml/hr for 18 hours/day (start at 3pm and stop feeding at 9am).  Flush of water at 3pm, 7pm, 11pm and 9am.   ondansetron 8 MG tablet Commonly known as: Zofran Take 1 tablet (8 mg total) by mouth every 8 (eight) hours as needed for nausea or vomiting. Start on the third day after cisplatin.   Oxycodone HCl 10 MG Tabs Take 1 tablet (10 mg total) by mouth every 6 (six) hours as needed for severe pain (pain score 7-10).   pantoprazole 40 MG  tablet Commonly known as: Protonix Take 1 tablet (40 mg total) by mouth 2 (two) times daily.   prochlorperazine 10 MG tablet Commonly known as: COMPAZINE TAKE 1 TABLET(10 MG) BY MOUTH EVERY 6 HOURS AS NEEDED FOR NAUSEA OR VOMITING   QUEtiapine 100 MG tablet Commonly known as: SEROQUEL Take 1 tablet (100 mg total) by mouth at bedtime.   sucralfate 1 GM/10ML suspension Commonly known as: CARAFATE Place 10 mLs (1 g total) into feeding tube in the morning, at noon, and at bedtime for 20 days.        Follow-up Information      Emergency Department at [facility]   Specialty: Emergency Medicine Why: If symptoms worsen Contact information: [address] [place] [address] [phone]        [name], MD In 1 week.   Specialty: Internal Medicine Contact information: [address] [place] [address] [phone]                Discharge Exam: Filed Weights   06/15/23 0446 06/16/23 0500 06/17/23 0500  Weight: 59.5 kg  62.3 kg 61.2 kg   General exam: Appears calm and comfortable, NAD  Respiratory system: No work of breathing, symmetric chest wall expansion, no cough, no wheezing, no rales Cardiovascular system: S1 & S2 heard, RRR.  Gastrointestinal system: Abdomen is nondistended, soft and nontender. PEG in place. Neuro: Alert and oriented. No focal neurological deficits. Extremities: Symmetric, expected ROM Skin: No rashes, lesions Psychiatry: Demonstrates appropriate judgement and insight. Mood & affect appropriate for situation.   Condition at discharge: stable  Discharge Instructions     Call MD for:  difficulty breathing, headache or visual disturbances   Complete by: As directed    Call MD for:  persistant dizziness or light-headedness   Complete by: As directed    Call MD for:  persistant nausea and vomiting   Complete by: As directed    Call MD for:  severe uncontrolled pain   Complete by: As  directed    Call MD for:  temperature >100.4   Complete by: As directed    Diet general   Complete by: As directed    Discharge instructions   Complete by: As directed    Follow-up with primary care in 1 week to repeat chest x-ray and ensure resolution of your pneumonia.  Began having fever, chills, difficulty breathing, return to the ED   Increase activity slowly   Complete by: As directed         The results of significant diagnostics from this hospitalization (including imaging, microbiology, ancillary and laboratory) are listed below for reference.   Imaging Studies: DG Swallowing Func-Speech Pathology Result Date: 06/17/2023 Table formatting from the original result was not included. Modified Barium Swallow Study Patient Details Name: [name] MRN: [id number] Date of Birth: 24-Mar-1971 Today's Date: 06/17/2023 HPI/PMH: HPI: FOXX KLARICH is 53 y.o. 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  Pt has had recurrent pneumonias.   He is now being admitted to the hospital with recurrent pneumonia.  CT neck lesion along the laryngeal surface of the epiglottis on the left, along the left aryepiglottic fold,  more inferiorly within the supraglottic larynx at midline and to the  left and extending inferiorly to the level of the false vocal cord.  This is consistent with a mucosal/submucosal neoplasm. The lesion  measures up to 8 mm in thickness and spans 3 cm in craniocaudal  dimension. The tumor partially effaces the left pyriform sinus.  Additionally, there is apparent infiltration of the paraglottic  space on the left. No definite involvement of the true vocal cords.  However, correlate with findings at pharyngolaryngoscopy.  Over the 5 days prior to arrival patient endorses increasing lethargy, cough,  and sputum production. CXR 06/16/23 New right middle lobe infiltrate, suspicious for pneumonia. CT chest 2/21 Findings of bronchitis with medial bilateral lower lobe tree-in-bud and ground-glass nodules, likely infectious/inflammatory.  Supraglottic (stage III (cT3, cN1, cM0)) in January 2025 who started radiation on 04/14/2023, chemotherapy with cisplatin on 04/19/2023, s/p Port-A-Cath and G-tube placement. Clinical Impression: Clinical Impression: Patient presents with functional oropharyngeal swallow ability without aspiration or frank penetration of any consistency tested.  He does have minimal difficulties orally likely due to xerostomia for which he can compensate.  He is observed to piecemeal which is compensatory for him.  Pharyngeal swallow is timely with initiation and strong without any retention.  Barium tablet taken  within readily cleared oral pharynx.  Patient does endorse reflux problems that are significant and states he takes reflux medication at home including a PPI.  Per chart review from office visit on 05/28/2023 patient was on Protonix 40 mg twice daily and famotidine 40 mg at night and he request to return to this medication protocol.  SLP will follow-up very briefly for education and encouragement to continue p.o. intake and exercises to decrease disuse muscle atrophy and impact of radiation/fibrosis.  Patient was educated to findings using teach back following MBS. Factors that may increase risk of adverse event in presence of aspiration Rubye Oaks & Clearance Coots 2021): Factors that may increase risk of adverse event in presence of aspiration Rubye Oaks & Clearance Coots 2021): Reduced saliva Recommendations/Plan: Swallowing Evaluation Recommendations Swallowing Evaluation Recommendations Recommendations: PO diet PO Diet Recommendation: Regular; Thin liquids (Level 0) Liquid Administration via: Cup; Straw Medication Administration: Whole meds with liquid Supervision: Patient able to self-feed Swallowing strategies   : -- (Start all intake with liquids) Postural changes: Stay upright 30-60 min after meals; Position pt fully upright for meals Oral care recommendations: Oral care BID (2x/day) Treatment Plan Treatment Plan Treatment recommendations: Therapy as outlined in treatment plan below Follow-up recommendations: Outpatient SLP Functional status assessment: Patient has not had a recent decline in their functional status. Treatment frequency: Min 1x/week Treatment duration: 1 week Interventions: Compensatory techniques; Patient/family education Recommendations Recommendations for follow up therapy are one component of a multi-disciplinary discharge planning process, led by the attending physician.  Recommendations may be updated based on patient status, additional functional criteria and insurance authorization. Assessment: Orofacial Exam: Orofacial Exam Oral Cavity: Oral Hygiene: Xerostomia Oral Cavity - Dentition: Adequate natural dentition Orofacial Anatomy: WFL Oral Motor/Sensory Function: WFL Anatomy: Anatomy: WFL Boluses Administered: Boluses Administered Boluses Administered: Thin liquids (Level 0); Mildly thick liquids (Level 2, nectar thick); Moderately thick liquids (Level 3, honey thick); Puree; Solid  Oral Impairment Domain: Oral Impairment Domain Lip Closure: No labial escape Tongue control during bolus hold: Cohesive bolus between tongue to palatal seal Bolus preparation/mastication: Slow prolonged chewing/mashing with complete recollection Bolus transport/lingual motion: Delayed initiation of tongue motion (oral holding) Oral residue: Residue collection on oral structures; Trace residue lining oral structures Location of oral residue : Tongue Initiation of pharyngeal swallow : Pyriform sinuses; Valleculae  Pharyngeal Impairment Domain: Pharyngeal Impairment Domain Soft palate elevation: No bolus between soft palate (SP)/pharyngeal wall (PW) Laryngeal elevation: Complete superior movement of thyroid cartilage  with complete approximation of arytenoids to epiglottic petiole Anterior hyoid excursion: Complete anterior movement Epiglottic movement: Complete inversion Laryngeal vestibule closure: Incomplete, narrow column air/contrast in laryngeal vestibule Pharyngeal stripping wave : Present - complete Pharyngeal contraction (A/P view only): N/A Pharyngoesophageal segment opening: Complete distension and complete duration, no obstruction of flow (?  Appearance of developing cervical web) Tongue base retraction: No contrast between tongue base and posterior pharyngeal wall (PPW) Pharyngeal residue: Complete pharyngeal clearance  Esophageal Impairment Domain: Esophageal Impairment Domain Esophageal clearance upright position: -- (Barium tablet appeared to help in the esophagus suspected near aortic arch without patient awareness.  Extra bolus of liquid appeared to readily facilitate clearance into the stomach.  Radiologist not present to confirm.) Pill: Pill Consistency administered: Thin liquids (Level 0) Thin liquids (Level 0): [facility] Penetration/Aspiration Scale Score: Penetration/Aspiration Scale Score 1.  Material does not enter airway: Mildly thick liquids (Level 2, nectar thick); Moderately thick liquids (Level 3, honey thick); Puree; Solid; Pill 2.  Material enters airway, remains ABOVE vocal cords then ejected out: Thin  liquids (Level 0) Compensatory Strategies: Compensatory Strategies Compensatory strategies: No   General Information: Caregiver present: No  Diet Prior to this Study: Dysphagia 3 (mechanical soft); Thin liquids (Level 0)   Temperature : Normal   Respiratory Status: WFL   Supplemental O2: None (Room air)   History of Recent Intubation: No  Behavior/Cognition: Alert; Cooperative Self-Feeding Abilities: Able to self-feed Baseline vocal quality/speech: Normal Volitional Cough: -- (Did not test due to concern for pain) Volitional Swallow: -- (Did not test due to concern for pain and xerostomia) Exam  Limitations: No limitations Goal Planning: Prognosis for improved oropharyngeal function: Good Barriers to Reach Goals: -- (Gustatory changes) No data recorded Patient/Family Stated Goal: Wants to get better Consulted and agree with results and recommendations: Patient Pain: Pain Assessment Pain Assessment: Faces Faces Pain Scale: 4 Pain Location: Left knee Pain Descriptors / Indicators: Aching; Moaning Pain Intervention(s): Limited activity within patient's tolerance End of Session: Start Time:SLP Start Time (ACUTE ONLY): 0815 Stop Time: SLP Stop Time (ACUTE ONLY): 0839 Time Calculation:SLP Time Calculation (min) (ACUTE ONLY): 24 min Charges: SLP Evaluations $ SLP Speech Visit: 1 Visit SLP Evaluations $MBS Swallow: 1 Procedure SLP visit diagnosis: SLP Visit Diagnosis: Dysphagia, oral phase (R13.11) Past Medical History: Past Medical History: Diagnosis Date  Anxiety   Bipolar 1 disorder (HCC)   EtOH dependence (HCC)   QUIT  GERD without esophagitis   Hepatitis C carrier (HCC)   Hypercholesteremia   Hypertension   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  Substance induced mood disorder (HCC) 11/05/2016  Syncope 05/21/2023 Past Surgical History: Past Surgical History: Procedure Laterality Date  ANEURYSM COILING    AGE 59 x2  APPENDECTOMY    BIOPSY  01/05/2017  Procedure: BIOPSY;  Surgeon: West Bali, MD;  Location: [facility];  Service: Endoscopy;;  Duodenal and Gastric  COLONOSCOPY WITH PROPOFOL N/A 01/05/2017  Procedure: COLONOSCOPY WITH PROPOFOL;  Surgeon: West Bali, MD;  Location: [facility];  Service: Endoscopy;  Laterality: N/A;  10:00am  DIRECT LARYNGOSCOPY Left 03/16/2023  Procedure: Direct laryngoscopy with biopsy of supraglottic mass;  Surgeon: Newman Pies, MD;   Location: [facility];  Service: ENT;  Laterality: Left;  ESOPHAGOGASTRODUODENOSCOPY (EGD) WITH PROPOFOL N/A 01/05/2017  Procedure: ESOPHAGOGASTRODUODENOSCOPY (EGD) WITH PROPOFOL;  Surgeon: West Bali, MD;  Location: [facility];  Service: Endoscopy;  Laterality: N/A;  IR GASTROSTOMY TUBE MOD SED  04/28/2023  IR IMAGING GUIDED PORT INSERTION  04/28/2023  IR PATIENT EVAL TECH 0-60 MINS  05/03/2023  LAPAROSCOPIC APPENDECTOMY    MASS EXCISION Right 01/13/2017  Procedure: EXCISION LIPOMA RIGHT ARM;  Surgeon: Lucretia Roers, MD;  Location: [facility];  Service: General;  Laterality: Right; Rolena Infante, MS [facility] SLP Acute Rehab Services Office [phone] Chales Abrahams 06/17/2023, 9:07 AM  VAS [place] LOWER EXTREMITY VENOUS (DVT) Result Date: 06/15/2023  Lower Venous DVT Study Patient Name:  ARGIL MAHL  Date of Exam:   06/15/2023 Medical Rec #: [id number]       Accession #:    [id number] Date of Birth: 1970-04-02      Patient Gender: M Patient Age:   50 years Exam Location:  [facility] Procedure:      VAS [place] LOWER EXTREMITY VENOUS (DVT)  Referring Phys: Willeen Niece --------------------------------------------------------------------------------  Indications: Pain.  Risk Factors: Cancer. Comparison Study: No prior studies. Performing Technologist: Chanda Busing RVT  Examination Guidelines: A complete evaluation includes B-mode imaging, spectral Doppler, color Doppler, and power Doppler as needed of all accessible portions of each vessel. Bilateral testing is considered an integral part of a complete examination. Limited examinations for reoccurring indications may be performed as noted. The reflux portion of the exam is performed with the patient in reverse Trendelenburg.  +-----+---------------+---------+-----------+----------+--------------+ RIGHTCompressibilityPhasicitySpontaneityPropertiesThrombus Aging +-----+---------------+---------+-----------+----------+--------------+ CFV   Full           Yes      Yes                                 +-----+---------------+---------+-----------+----------+--------------+   +---------+---------------+---------+-----------+----------+--------------+ LEFT     CompressibilityPhasicitySpontaneityPropertiesThrombus Aging +---------+---------------+---------+-----------+----------+--------------+ CFV      Full           Yes      Yes                                 +---------+---------------+---------+-----------+----------+--------------+ SFJ      Full                                                        +---------+---------------+---------+-----------+----------+--------------+ FV Prox  Full                                                        +---------+---------------+---------+-----------+----------+--------------+ FV Mid   Full                                                        +---------+---------------+---------+-----------+----------+--------------+ FV DistalFull                                                        +---------+---------------+---------+-----------+----------+--------------+ PFV      Full                                                        +---------+---------------+---------+-----------+----------+--------------+ POP      Full           Yes      Yes                                 +---------+---------------+---------+-----------+----------+--------------+ PTV      Full                                                        +---------+---------------+---------+-----------+----------+--------------+  PERO     Full                                                        +---------+---------------+---------+-----------+----------+--------------+    Summary: RIGHT: - No evidence of common femoral vein obstruction.   LEFT: - There is no evidence of deep vein thrombosis in the lower extremity.  - No cystic structure found in the popliteal fossa.  *See  table(s) above for measurements and observations. Electronically signed by Lemar Livings MD on 06/15/2023 at 3:52:51 PM.    Final    DG Chest 2 View Result Date: 06/14/2023 CLINICAL DATA:  Productive cough for several days. Laryngeal carcinoma. EXAM: CHEST - 2 VIEW COMPARISON:  05/21/2023 FINDINGS: The heart size and mediastinal contours are within normal limits. Right-sided Port-A-Cath remains in place. New airspace disease is seen in the inferior right middle lobe, suspicious for pneumonia. Left lung is clear. No pleural effusion. The visualized skeletal structures are unremarkable. IMPRESSION: New right middle lobe infiltrate, suspicious for pneumonia. Recommend continued chest radiographic follow-up to confirm resolution. Electronically Signed   By: Danae Orleans M.D.   On: 06/14/2023 09:01   ECHOCARDIOGRAM COMPLETE Result Date: 05/22/2023    ECHOCARDIOGRAM REPORT   Patient Name:   AMBERS IYENGAR Date of Exam: 05/22/2023 Medical Rec #:  [id number]      Height:       64.0 in Accession #:    [id number]     Weight:       139.0 lb Date of Birth:  26-Oct-1970     BSA:          1.676 m Patient Age:    52 years       BP:           136/85 mmHg Patient Gender: M              HR:           72 bpm. [facility] Procedure: 2D Echo, 3D Echo, Cardiac Doppler, Color Doppler and Strain Analysis            (Both Spectral and Color Flow Doppler were utilized during            procedure). Indications:    Syncope  History:        Patient has prior history of Echocardiogram examinations, most                 recent 12/24/2016. Risk Factors:Hypertension and Former Smoker.  Sonographer:    Karma Ganja Referring Phys: [id number] COURAGE EMOKPAE  Sonographer Comments: Global longitudinal strain was attempted. IMPRESSIONS  1. Left ventricular ejection fraction, by estimation, is 60 to 65%. The left ventricle has normal function. The left ventricle has no regional wall motion abnormalities. Left ventricular diastolic parameters  were normal. The average left ventricular global longitudinal strain is -18.2 %. The global longitudinal strain is normal.  2. Right ventricular systolic function is normal. The right ventricular size is normal.  3. The mitral valve is normal in structure. No evidence of mitral valve regurgitation. No evidence of mitral stenosis.  4. The aortic valve is normal in structure. Aortic valve regurgitation is not visualized. No aortic stenosis is present.  5. The inferior vena cava is normal in size with greater than 50% respiratory  variability, suggesting right atrial pressure of 3 mmHg. FINDINGS  Left Ventricle: Left ventricular ejection fraction, by estimation, is 60 to 65%. The left ventricle has normal function. The left ventricle has no regional wall motion abnormalities. The average left ventricular global longitudinal strain is -18.2 %. Strain was performed and the global longitudinal strain is normal. 3D ejection fraction reviewed and evaluated as part of the interpretation. Alternate measurement of EF is felt to be most reflective of LV function. The left ventricular internal cavity size was normal in size. There is no left ventricular hypertrophy. Left ventricular diastolic parameters were normal. Right Ventricle: The right ventricular size is normal. No increase in right ventricular wall thickness. Right ventricular systolic function is normal. Left Atrium: Left atrial size was normal in size. Right Atrium: Right atrial size was normal in size. Pericardium: There is no evidence of pericardial effusion. Mitral Valve: The mitral valve is normal in structure. No evidence of mitral valve regurgitation. No evidence of mitral valve stenosis. Tricuspid Valve: The tricuspid valve is normal in structure. Tricuspid valve regurgitation is not demonstrated. No evidence of tricuspid stenosis. Aortic Valve: The aortic valve is normal in structure. Aortic valve regurgitation is not visualized. No aortic stenosis is present.  Aortic valve mean gradient measures 6.0 mmHg. Aortic valve peak gradient measures 11.0 mmHg. Aortic valve area, by VTI measures 2.14 cm. Pulmonic Valve: The pulmonic valve was not well visualized. Aorta: The aortic root is normal in size and structure. Venous: The inferior vena cava is normal in size with greater than 50% respiratory variability, suggesting right atrial pressure of 3 mmHg. IAS/Shunts: No atrial level shunt detected by color flow Doppler. Additional Comments: 3D was performed not requiring image post processing on an independent workstation and was indeterminate.  LEFT VENTRICLE PLAX 2D LVIDd:         4.30 cm   Diastology LVIDs:         2.90 cm   LV e' medial:    9.79 cm/s LV PW:         0.80 cm   LV E/e' medial:  9.0 LV IVS:        0.90 cm   LV e' lateral:   15.80 cm/s LVOT diam:     1.90 cm   LV E/e' lateral: 5.6 LV SV:         62 LV SV Index:   37        2D Longitudinal Strain LVOT Area:     2.84 cm  2D Strain GLS Avg:     -18.2 %                           3D Volume EF:                          3D EF:        52 %                          LV EDV:       182 ml                          LV ESV:       87 ml  LV SV:        95 ml RIGHT VENTRICLE RV Basal diam:  4.10 cm RV S prime:     17.60 cm/s TAPSE (M-mode): 2.6 cm LEFT ATRIUM             Index        RIGHT ATRIUM           Index LA diam:        2.90 cm 1.73 cm/m   RA Area:     10.20 cm LA Vol (A2C):   65.5 ml 39.08 ml/m  RA Volume:   19.20 ml  11.46 ml/m LA Vol (A4C):   28.2 ml 16.82 ml/m LA Biplane Vol: 43.0 ml 25.66 ml/m  AORTIC VALVE AV Area (Vmax):    2.03 cm AV Area (Vmean):   2.10 cm AV Area (VTI):     2.14 cm AV Vmax:           166.00 cm/s AV Vmean:          106.000 cm/s AV VTI:            0.290 m AV Peak Grad:      11.0 mmHg AV Mean Grad:      6.0 mmHg LVOT Vmax:         119.00 cm/s LVOT Vmean:        78.400 cm/s LVOT VTI:          0.219 m LVOT/AV VTI ratio: 0.76  AORTA Ao Root diam: 3.10 cm MITRAL VALVE MV  Area (PHT): 4.06 cm    SHUNTS MV Decel Time: 187 msec    Systemic VTI:  0.22 m MV E velocity: 87.80 cm/s  Systemic Diam: 1.90 cm MV A velocity: 75.00 cm/s MV E/A ratio:  1.17 Mihai Croitoru MD Electronically signed by Thurmon Fair MD Signature Date/Time: 05/22/2023/12:49:20 PM    Final    US Carotid Bilateral Result Date: 05/21/2023 CLINICAL DATA:  Syncope EXAM: BILATERAL CAROTID DUPLEX ULTRASOUND TECHNIQUE: Wallace Cullens scale imaging, color Doppler and duplex ultrasound were performed of bilateral carotid and vertebral arteries in the neck. COMPARISON:  None Available. FINDINGS: Criteria: Quantification of carotid stenosis is based on velocity parameters that correlate the residual internal carotid diameter with NASCET-based stenosis levels, using the diameter of the distal internal carotid lumen as the denominator for stenosis measurement. The following velocity measurements were obtained: RIGHT ICA: 114/35 cm/sec CCA: 118/16 cm/sec SYSTOLIC ICA/CCA RATIO:  1.3 ECA:  68 cm/sec LEFT ICA: 92/29 cm/sec CCA: 106/26 cm/sec SYSTOLIC ICA/CCA RATIO:  1.0 ECA:  84 cm/sec RIGHT CAROTID ARTERY: Mild heterogeneous atherosclerotic plaque in the proximal internal carotid artery. By peak systolic velocity criteria, the estimated stenosis is less than 50%. RIGHT VERTEBRAL ARTERY:  Patent with normal antegrade flow. LEFT CAROTID ARTERY: Mild heterogeneous atherosclerotic plaque in the proximal internal carotid artery. By peak systolic velocity criteria, the estimated stenosis is less than 50%. LEFT VERTEBRAL ARTERY:  Patent with normal antegrade flow. Other: Incidental note is made of thrombus within the right internal jugular vein. The vein is noncompressible in there is no evidence of color flow. Findings are consistent with acute occlusive thrombus. IMPRESSION: 1. Incidental note is made of acute occlusive thrombus throughout the right internal jugular vein. 2. Mild (1-49%) stenosis proximal right internal carotid artery secondary  to heterogenous atherosclerotic plaque. 3. Mild (1-49%) stenosis proximal left internal carotid artery secondary to heterogenous atherosclerotic plaque. 4. Vertebral arteries are patent with normal antegrade flow. Electronically Signed   By: Vilma Prader  Archer Asa M.D.   On: 05/21/2023 13:55   CT Angio Chest Pulmonary Embolism (PE) W or WO Contrast Result Date: 05/21/2023 CLINICAL DATA:  History of laryngeal cancer with syncopal episode, shortness of breath, and weakness. Altered mental status EXAM: CT ANGIOGRAPHY CHEST WITH CONTRAST TECHNIQUE: Multidetector CT imaging of the chest was performed using the standard protocol during bolus administration of intravenous contrast. Multiplanar CT image reconstructions and MIPs were obtained to evaluate the vascular anatomy. RADIATION DOSE REDUCTION: This exam was performed according to the departmental dose-optimization program which includes automated exposure control, adjustment of the mA and/or kV according to patient size and/or use of iterative reconstruction technique. CONTRAST:  75mL OMNIPAQUE IOHEXOL 350 MG/ML SOLN COMPARISON:  Same day chest radiograph FINDINGS: Cardiovascular: Right chest wall port terminates in the right atrium. The study is high quality for the evaluation of pulmonary embolism. There are no filling defects in the central, lobar, segmental or subsegmental pulmonary artery branches to suggest acute pulmonary embolism. Great vessels are normal in course and caliber. Normal heart size. No significant pericardial fluid/thickening. Coronary artery calcifications and aortic atherosclerosis. Mediastinum/Nodes: Imaged thyroid gland without nodules meeting criteria for imaging follow-up by size. Normal esophagus. No pathologically enlarged axillary, supraclavicular, mediastinal, or hilar lymph nodes. Lungs/Pleura: The central airways are patent. Layering secretions within the trachea extending into bilateral bronchi with left lower lobe subsegmental mucous  plugging. Mild diffuse bronchial wall thickening. Minimal right apical paraseptal emphysema. Medial bilateral lower lobe tree-in-bud and ground-glass nodules. No pneumothorax. No pleural effusion. Upper abdomen: Partially imaged percutaneous gastrostomy tube in-situ. Musculoskeletal: No acute or abnormal lytic or blastic osseous lesions. Review of the MIP images confirms the above findings. IMPRESSION: 1. No evidence of pulmonary embolism. 2. Findings of bronchitis with medial bilateral lower lobe tree-in-bud and ground-glass nodules, likely infectious/inflammatory. 3. Aortic Atherosclerosis (ICD10-I70.0) and Emphysema (ICD10-J43.9). Coronary artery calcifications. Assessment for potential risk factor modification, dietary therapy or pharmacologic therapy may be warranted, if clinically indicated. Electronically Signed   By: Agustin Cree M.D.   On: 05/21/2023 12:01   US Venous Img Lower Bilateral (DVT) Result Date: 05/21/2023 CLINICAL DATA:  Pulmonary emboli (HCC) EXAM: BILATERAL LOWER EXTREMITY VENOUS DOPPLER ULTRASOUND TECHNIQUE: Gray-scale sonography with graded compression, as well as color Doppler and duplex ultrasound were performed to evaluate the lower extremity deep venous systems from the level of the common femoral vein and including the common femoral, femoral, profunda femoral, popliteal and calf veins including the posterior tibial, peroneal and gastrocnemius veins when visible. The superficial great saphenous vein was also interrogated. Spectral Doppler was utilized to evaluate flow at rest and with distal augmentation maneuvers in the common femoral, femoral and popliteal veins. COMPARISON:  None Available. FINDINGS: RIGHT LOWER EXTREMITY Common Femoral Vein: No evidence of thrombus. Normal compressibility, respiratory phasicity and response to augmentation. Saphenofemoral Junction: No evidence of thrombus. Normal compressibility and flow on color Doppler imaging. Profunda Femoral Vein: No evidence  of thrombus. Normal compressibility and flow on color Doppler imaging. Femoral Vein: No evidence of thrombus. Normal compressibility, respiratory phasicity and response to augmentation. Popliteal Vein: No evidence of thrombus. Normal compressibility, respiratory phasicity and response to augmentation. Calf Veins: No evidence of thrombus. Normal compressibility and flow on color Doppler imaging. LEFT LOWER EXTREMITY Common Femoral Vein: No evidence of thrombus. Normal compressibility, respiratory phasicity and response to augmentation. Saphenofemoral Junction: No evidence of thrombus. Normal compressibility and flow on color Doppler imaging. Profunda Femoral Vein: No evidence of thrombus. Normal compressibility and flow on color Doppler imaging.  Femoral Vein: No evidence of thrombus. Normal compressibility, respiratory phasicity and response to augmentation. Popliteal Vein: No evidence of thrombus. Normal compressibility, respiratory phasicity and response to augmentation. Calf Veins: No evidence of thrombus. Normal compressibility and flow on color Doppler imaging. IMPRESSION: No evidence of deep venous thrombosis in either lower extremity. Electronically Signed   By: Feliberto Harts M.D.   On: 05/21/2023 10:42   DG Chest Port 1 View Result Date: 05/21/2023 CLINICAL DATA:  weakness EXAM: PORTABLE CHEST 1 VIEW COMPARISON:  Chest x-ray 05/03/2023 FINDINGS: The heart and mediastinal contours are unchanged. Right chest wall accessed Port-A-Cath with tip overlying the expected region the superior caval junction. Atherosclerotic plaque. No focal consolidation. No pulmonary edema. No pleural effusion. No pneumothorax. No acute osseous abnormality. IMPRESSION: 1. No active disease. 2.  Aortic Atherosclerosis (ICD10-I70.0). Electronically Signed   By: Tish Frederickson M.D.   On: 05/21/2023 01:59    Microbiology: Results for orders placed or performed during the hospital encounter of 06/14/23  Resp panel by RT-PCR  (RSV, Flu A&B, Covid) Anterior Nasal Swab     Status: None   Collection Time: 06/14/23  6:43 AM   Specimen: Anterior Nasal Swab  Result Value Ref Range Status   SARS Coronavirus 2 by RT PCR NEGATIVE NEGATIVE Final    Comment: (NOTE) SARS-CoV-2 target nucleic acids are NOT DETECTED.  The SARS-CoV-2 RNA is generally detectable in upper respiratory specimens during the acute phase of infection. The lowest concentration of SARS-CoV-2 viral copies this assay can detect is 138 copies/mL. A negative result does not preclude SARS-Cov-2 infection and should not be used as the sole basis for treatment or other patient management decisions. A negative result may occur with  improper specimen collection/handling, submission of specimen other than nasopharyngeal swab, presence of viral mutation(s) within the areas targeted by this assay, and inadequate number of viral copies(<138 copies/mL). A negative result must be combined with clinical observations, patient history, and epidemiological information. The expected result is Negative.  Fact Sheet for Patients:  BloggerCourse.com  Fact Sheet for Healthcare Providers:  SeriousBroker.it  This test is no t yet approved or cleared by the [place] FDA and  has been authorized for detection and/or diagnosis of SARS-CoV-2 by FDA under an Emergency Use Authorization (EUA). This EUA will remain  in effect (meaning this test can be used) for the duration of the COVID-19 declaration under Section 564(b)(1) of the Act, 21 U.S.C.section 360bbb-3(b)(1), unless the authorization is terminated  or revoked sooner.       Influenza A by PCR NEGATIVE NEGATIVE Final   Influenza B by PCR NEGATIVE NEGATIVE Final    Comment: (NOTE) The Xpert Xpress SARS-CoV-2/FLU/RSV plus assay is intended as an aid in the diagnosis of influenza from Nasopharyngeal swab specimens and should not be used as a sole basis for  treatment. Nasal washings and aspirates are unacceptable for Xpert Xpress SARS-CoV-2/FLU/RSV testing.  Fact Sheet for Patients: BloggerCourse.com  Fact Sheet for Healthcare Providers: SeriousBroker.it  This test is not yet approved or cleared by the [place] FDA and has been authorized for detection and/or diagnosis of SARS-CoV-2 by FDA under an Emergency Use Authorization (EUA). This EUA will remain in effect (meaning this test can be used) for the duration of the COVID-19 declaration under Section 564(b)(1) of the Act, 21 U.S.C. section 360bbb-3(b)(1), unless the authorization is terminated or revoked.     Resp Syncytial Virus by PCR NEGATIVE NEGATIVE Final    Comment: (NOTE) Fact Sheet for  Patients: BloggerCourse.com  Fact Sheet for Healthcare Providers: SeriousBroker.it  This test is not yet approved or cleared by the [place] FDA and has been authorized for detection and/or diagnosis of SARS-CoV-2 by FDA under an Emergency Use Authorization (EUA). This EUA will remain in effect (meaning this test can be used) for the duration of the COVID-19 declaration under Section 564(b)(1) of the Act, 21 U.S.C. section 360bbb-3(b)(1), unless the authorization is terminated or revoked.  Performed at [facility], [address] [place], [place] [address]   MRSA Next Gen by PCR, Nasal     Status: None   Collection Time: 06/14/23 11:45 AM   Specimen: Nasal Mucosa; Nasal Swab  Result Value Ref Range Status   MRSA by PCR Next Gen NOT DETECTED NOT DETECTED Final    Comment: (NOTE) The GeneXpert MRSA Assay (FDA approved for NASAL specimens only), is one component of a comprehensive MRSA colonization surveillance program. It is not intended to diagnose MRSA infection nor to guide or monitor treatment for MRSA infections. Test performance is not FDA approved  in patients less than 55 years old. Performed at [facility], [address], [place], [place] [address]     Labs: CBC: Recent Labs  Lab 06/14/23 1058 06/15/23 0412 06/17/23 0417  WBC 4.2 3.2* 2.9*  NEUTROABS 3.0  --  2.3  HGB 8.9* 9.1* 8.3*  HCT 28.0* 27.6* 26.6*  MCV 94.3 92.3 94.7  PLT 201 213 256   Basic Metabolic Panel: Recent Labs  Lab 06/14/23 1058 06/15/23 0410 06/15/23 0412 06/15/23 1824 06/16/23 0415 06/17/23 0417  NA 134*  --  136  --   --  134*  K 4.3  --  4.0  --   --  4.3  CL 98  --  102  --   --  102  CO2 27  --  24  --   --  25  GLUCOSE 106*  --  120*  --   --  117*  BUN 17  --  20  --   --  17  CREATININE 1.14  --  0.97  --   --  0.92  CALCIUM 8.4*  --  8.5*  --   --  8.2*  MG  --  2.1  --  1.9 2.0 2.0  PHOS  --  3.7  --  2.8 3.3 3.3   Liver Function Tests: Recent Labs  Lab 06/17/23 0417  AST 11*  ALT 8  ALKPHOS 46  BILITOT 0.3  PROT 6.1*  ALBUMIN 2.5*   CBG: Recent Labs  Lab 06/16/23 2014 06/17/23 0058 06/17/23 0431 06/17/23 0750 06/17/23 1159  GLUCAP 130* 94 123* 103* 104*    Discharge time spent: 32 minutes.  Signed: Debarah Crape, DO [facility] 06/17/2023

## 2023-06-17 NOTE — Progress Notes (Signed)
 Modified Barium Swallow Study  Patient Details  Name: [name] MRN: [id number] Date of Birth: October 27, 1970  Today's Date: 06/17/2023  Modified Barium Swallow completed.  Full report located under Chart Review in the Imaging Section.  History of Present Illness [name] is 54 y.o. 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  Pt has had recurrent pneumonias.   He is now being admitted to the hospital with recurrent pneumonia.  CT neck lesion along the laryngeal surface of the epiglottis on the left, along the left aryepiglottic fold,  more inferiorly within the supraglottic larynx at midline and to the  left and extending inferiorly to the level of the false vocal cord.  This is consistent with a mucosal/submucosal neoplasm. The lesion  measures up to 8 mm in thickness and spans 3 cm in craniocaudal  dimension. The tumor partially effaces the left pyriform sinus.  Additionally, there is apparent infiltration of the paraglottic  space on the left. No definite involvement of the true vocal cords.  However, correlate with findings at pharyngolaryngoscopy.  Over the 5 days prior to arrival patient endorses increasing lethargy, cough, and sputum production. CXR 06/16/23 New right middle lobe infiltrate, suspicious for pneumonia. CT chest 2/21 Findings of bronchitis with medial bilateral lower lobe tree-in-bud and ground-glass nodules, likely infectious/inflammatory.  Supraglottic (stage III (cT3, cN1, cM0)) in January 2025 who started radiation on 04/14/2023, chemotherapy with cisplatin on 04/19/2023, s/p Port-A-Cath and G-tube placement.   Clinical Impression Patient presents with functional oropharyngeal swallow ability without aspiration or frank penetration of any consistency tested.  He does have minimal  difficulties orally likely due to xerostomia for which he can compensate.  He is observed to piecemeal which is compensatory for him.  Pharyngeal swallow is timely with initiation and strong without any retention.  Barium tablet taken within readily cleared oral pharynx.  Patient does endorse reflux problems that are significant and states he takes reflux medication at home including a PPI.  Per chart review from office visit on 05/28/2023 patient was on Protonix 40 mg twice daily and famotidine 40 mg at night and he request to return to this medication protocol.  SLP will follow-up very briefly for education and encouragement to continue p.o. intake and exercises to decrease disuse muscle atrophy and impact of radiation/fibrosis.  Patient was educated to findings using teach back following MBS. Factors that may increase risk of adverse event in presence of aspiration [name] & Clearance [name] 2021): Reduced saliva  Swallow Evaluation Recommendations Recommendations: PO diet PO Diet Recommendation: Regular;Thin liquids (Level 0) Liquid Administration via: Cup;Straw Medication Administration: Whole meds with liquid Supervision: Patient able to self-feed Swallowing strategies  :  (Start all intake with liquids) Postural changes: Stay upright 30-60 min after meals;Position pt fully upright for meals Oral care recommendations: Oral care BID (2x/day)    Rolena Infante, MS [facility] SLP Acute Rehab Services Office [phone]   Chales Abrahams 06/17/2023,9:07 AM

## 2023-06-17 NOTE — Consult Note (Signed)
 [facility] Liaison Consult Note    06/17/2023  [name] 1970-05-06 [id number]  Insurance: [employer]  Primary Care Provider: [name], MD with [facility], this provider is listed for the transition of care follow up appointments  and [facility] calls   [facility] Liaison screened the patient remotely at [facility].  Call attempt made to follow up on post hospital needs however no answer to bedside phone.   The patient was screened for 30 day readmission hospitalization with noted extreme risk score for unplanned readmission risk 3 hospital admissions in 6 months.  The patient was assessed for potential [facility] Coordination service needs for post hospital transition for care coordination. Review of patient's electronic medical record reveals patient is admitted with recurrent pneumonia for patient with Stage III Supraglottic carcinoma - chemotherapy and radiation therapy.   Plan: [facility] .  Referral request for community care coordination: Currently, anticipate follow up [facility] TOC team calls.   [facility] Community Care, Population Health does not replace or interfere with any arrangements made by the Inpatient Transition of Care team.   For questions contact:   Charlesetta Shanks, RN, BSN, CCM [place]  [facility], [facility] Liaison Direct Dial: [phone] or secure chat Email: [email or web address]

## 2023-06-21 DIAGNOSIS — B182 Chronic viral hepatitis C: Secondary | ICD-10-CM | POA: Diagnosis not present

## 2023-06-21 DIAGNOSIS — A419 Sepsis, unspecified organism: Secondary | ICD-10-CM | POA: Diagnosis not present

## 2023-06-21 DIAGNOSIS — D630 Anemia in neoplastic disease: Secondary | ICD-10-CM | POA: Diagnosis not present

## 2023-06-21 DIAGNOSIS — F319 Bipolar disorder, unspecified: Secondary | ICD-10-CM | POA: Diagnosis not present

## 2023-06-21 DIAGNOSIS — J189 Pneumonia, unspecified organism: Secondary | ICD-10-CM | POA: Diagnosis not present

## 2023-06-21 DIAGNOSIS — I82C11 Acute embolism and thrombosis of right internal jugular vein: Secondary | ICD-10-CM | POA: Diagnosis not present

## 2023-06-21 DIAGNOSIS — I10 Essential (primary) hypertension: Secondary | ICD-10-CM | POA: Diagnosis not present

## 2023-06-21 DIAGNOSIS — C321 Malignant neoplasm of supraglottis: Secondary | ICD-10-CM | POA: Diagnosis not present

## 2023-06-21 DIAGNOSIS — Z431 Encounter for attention to gastrostomy: Secondary | ICD-10-CM | POA: Diagnosis not present

## 2023-06-21 NOTE — Telephone Encounter (Signed)
 Spoke to cindy to approve orders.

## 2023-06-21 NOTE — Progress Notes (Incomplete)
  Mr. [name] is here for two week follow-up appointment today with Bryan Lemma PA-C.  Treatment Completion Date: 2023-06-08  Pain issues, if any: Yes, 7/10 pain in throat. Using a feeding tube?: Yes Weight changes, if any: He reports trying to gain the weight back. Swallowing issues, if any: Yes, but reports that it is better than it was. Smoking or chewing tobacco? Denies Using fluoride toothpaste daily? Yes Last ENT visit was on: December 2024 Other notable issues, if any: He wants to know if all the cancer was captured.   BP 118/83 (BP Location: Left Arm, Patient Position: Sitting)   Pulse 85   Temp 97.8 F (36.6 C) (Temporal)   Resp 18   Ht 5\' 4"  (1.626 m)   Wt 137 lb (62.1 kg)   SpO2 99%   BMI 23.52 kg/m

## 2023-06-21 NOTE — Telephone Encounter (Signed)
 Copied from CRM [id number]. Topic: Clinical - Home Health Verbal Orders >> Jun 18, 2023  2:39 PM Higinio Roger wrote: Caller/Agency: [facility] Callback Number: [phone] Service Requested: Physical Therapy, Nursing, & Speech Frequency: post hospitalization Any new concerns about the patient? No

## 2023-06-22 ENCOUNTER — Ambulatory Visit: Payer: Self-pay

## 2023-06-22 ENCOUNTER — Encounter: Payer: Self-pay | Admitting: Radiology

## 2023-06-22 ENCOUNTER — Ambulatory Visit: Admitting: Radiology

## 2023-06-22 ENCOUNTER — Ambulatory Visit
Admission: RE | Admit: 2023-06-22 | Discharge: 2023-06-22 | Disposition: A | Discharge status: Home / Self Care | Source: Ambulatory Visit | Attending: Radiology | Admitting: Radiology

## 2023-06-22 ENCOUNTER — Ambulatory Visit: Payer: Medicare HMO

## 2023-06-22 ENCOUNTER — Inpatient Hospital Stay: Admitting: Dietician

## 2023-06-22 VITALS — BP 118/83 | HR 85 | Temp 97.8°F | Resp 18 | Ht 64.0 in | Wt 137.0 lb

## 2023-06-22 DIAGNOSIS — C321 Malignant neoplasm of supraglottis: Secondary | ICD-10-CM

## 2023-06-22 NOTE — Progress Notes (Signed)
 Oncology Nurse Navigator Documentation   I met with [name] and his wife before his appointment with [facility] today. He reports that he is feeling much better but still is experiencing loss of taste causing difficulty eating. He continues to use his PEG for nutrition. He will see Dr. Arlana Pouch later this week and return to Dr. Basilio Cairo in June after completion of his post treatment PET scan for results. He and his wife know to call me for any questions or concerns at any time.   Hedda Slade RN, BSN, OCN Head & Neck Oncology Nurse Navigator [facility] Phone # [phone]  Fax # [phone]

## 2023-06-22 NOTE — Progress Notes (Signed)
 Radiation Oncology         [phone] ________________________________  Name: [name] MRN: [id number]  Date: 06/22/2023  DOB: 17-Nov-1970  Follow-Up Visit Note  CC: [name], MD  [name], MD  Diagnosis and Prior Radiotherapy:       ICD-10-CM   1. Malignant neoplasm of supraglottis (HCC)  C32.1 NM PET Image Restag (PS) Skull Base To Thigh     ==========DELIVERED PLANS==========  First Treatment Date: 2023-04-14 Last Treatment Date: 2023-06-08   Plan Name: HN Site: Larynx Technique: IMRT Mode: Photon Dose Per Fraction: 2 Gy Prescribed Dose (Delivered / Prescribed): 70 Gy / 70 Gy Prescribed Fxs (Delivered / Prescribed): 35 / 35  Cancer Staging  Malignant neoplasm of supraglottis (HCC) Staging form: Larynx - Supraglottis, AJCC 8th Edition - Clinical stage from 03/26/2023: Stage III (cT1, cN1, cM0) - Unsigned Stage prefix: Initial diagnosis  Stage III (cT1, cN1, 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 He completed treatment on 06/08/2023.   Treatment Completion Date: 2023-06-08  Pain issues, if any: Yes, 7/10 pain in throat. Takes oxycodone at night and Tylenol PRN throughout the day.  Using a feeding tube?: Yes Weight changes, if any: He reports trying to gain the weight back.  Swallowing issues, if any: Difficulty with swallowing, but he is eating softer foods.  Smoking or chewing tobacco? Denies Using fluoride toothpaste daily? Yes Last ENT visit was on: December 2024 Other notable issues: Patient was discharged from the hospital for pneumonia 5 days ago. He reports to be doing well since then.    Wt Readings from Last 3 Encounters:  06/22/23 137 lb (62.1 kg)  06/17/23 134 lb 14.7 oz (61.2 kg)  06/08/23 140 lb 12.8 oz (63.9 kg)    Narrative:  The patient returns today for  routine follow-up.                     ALLERGIES:  has no known allergies.  Meds: Current Outpatient Medications  Medication Sig Dispense Refill   acetaminophen (TYLENOL) 500 MG tablet Take 1,000 mg by mouth at bedtime as needed for mild pain (pain score 1-3) or moderate pain (pain score 4-6).     amoxicillin-clavulanate (AUGMENTIN) 875-125 MG tablet Take 1 tablet by mouth 2 (two) times daily for 5 days. 10 tablet 0   apixaban (ELIQUIS) 5 MG TABS tablet Take 5 mg by mouth in the morning and at bedtime 388 tablet 0   famotidine (PEPCID) 40 MG tablet Take 1 tablet (40 mg total) by mouth at bedtime.     FLUoxetine (PROZAC) 20 MG capsule Take 1 capsule (20 mg total) by mouth daily. 30 capsule 3   guaiFENesin-dextromethorphan (ROBITUSSIN DM) 100-10 MG/5ML syrup Place 15 mLs into feeding tube every 6 (six) hours. (Patient taking differently: Take 15 mLs by mouth every 6 (six) hours.) 118 mL 0   hydrOXYzine (ATARAX) 25 MG tablet Take 1 tablet (25 mg total) by mouth 3 (three) times daily as needed for anxiety. (Patient taking differently: Take 25 mg by mouth at bedtime.) 90 tablet 1   lidocaine (XYLOCAINE) 2 % solution Patient: Mix 1part 2% viscous lidocaine, 1part H20. Swallow 10mL of diluted mixture, before meals and at bedtime, up to QID 200 mL 3   lidocaine-prilocaine (EMLA) cream Apply to affected area once (Patient taking differently: Apply 1  Application topically as needed (port).) 30 g 3   metoprolol tartrate (LOPRESSOR) 25 MG tablet Take 0.5 tablets (12.5 mg total) by mouth 2 (two) times daily. 60 tablet 1   Nutritional Supplements (NUTREN 1.5) LIQD 18ml/hr for 18 hours/day (start at 3pm and stop feeding at 9am).  Flush of water at 3pm, 7pm, 11pm and 9am.     ondansetron (ZOFRAN) 8 MG tablet Take 1 tablet (8 mg total) by mouth every 8 (eight) hours as needed for nausea or vomiting. Start on the third day after cisplatin. 30 tablet 1   oxyCODONE 10 MG TABS Take 1 tablet (10 mg total)  by mouth every 6 (six) hours as needed for severe pain (pain score 7-10). 60 tablet 0   pantoprazole (PROTONIX) 40 MG tablet Take 1 tablet (40 mg total) by mouth 2 (two) times daily. 60 tablet 2   prochlorperazine (COMPAZINE) 10 MG tablet TAKE 1 TABLET(10 MG) BY MOUTH EVERY 6 HOURS AS NEEDED FOR NAUSEA OR VOMITING 30 tablet 1   Protein (FEEDING SUPPLEMENT, PROSOURCE TF20,) liquid Place 60 mLs into feeding tube daily. 1800 mL 0   QUEtiapine (SEROQUEL) 100 MG tablet Take 1 tablet (100 mg total) by mouth at bedtime. 30 tablet 1   dexamethasone (DECADRON) 4 MG tablet Take 2 tablets (8 mg) by mouth daily x 3 days starting the day after cisplatin chemotherapy. Take with food. (Patient not taking: Reported on 06/14/2023) 30 tablet 1   sucralfate (CARAFATE) 1 GM/10ML suspension Place 10 mLs (1 g total) into feeding tube in the morning, at noon, and at bedtime for 20 days. (Patient not taking: Reported on 06/14/2023) 600 mL 0   No current facility-administered medications for this encounter.    Physical Findings: The patient is in no acute distress. Patient is alert and oriented. Wt Readings from Last 3 Encounters:  06/22/23 137 lb (62.1 kg)  06/17/23 134 lb 14.7 oz (61.2 kg)  06/08/23 140 lb 12.8 oz (63.9 kg)    height is 5\' 4"  (1.626 m) and weight is 137 lb (62.1 kg). His temporal temperature is 97.8 F (36.6 C). His blood pressure is 118/83 and his pulse is 85. His respiration is 18 and oxygen saturation is 99%. .  General: Alert and oriented, in no acute distress HEENT: Head is normocephalic. Extraocular movements are intact. Oropharynx is notable for mucositis throughout the oral mucosa.  Neck: No palpable adenopathy  Skin: Skin is notable for hypopigmentation changes and dry desquamation within the treatment field.  Heart: Regular in rate and rhythm with no murmurs, rubs, or gallops. Chest: Clear to auscultation bilaterally, with no rhonchi, wheezes, or rales. Abdomen: Soft, nontender,  nondistended, with no rigidity or guarding. Extremities: No cyanosis or edema. Lymphatics: see Neck Exam Psychiatric: Judgment and insight are intact. Affect is appropriate.   Lab Findings: Lab Results  Component Value Date   WBC 2.9 (L) 06/17/2023   HGB 8.3 (L) 06/17/2023   HCT 26.6 (L) 06/17/2023   MCV 94.7 06/17/2023   PLT 256 06/17/2023    Lab Results  Component Value Date   TSH 1.058 04/16/2023    Radiographic Findings: DG Swallowing Func-Speech Pathology Result Date: 06/17/2023 Table formatting from the original result was not included. Modified Barium Swallow Study Patient Details Name: FINIAN HELVEY MRN: [id number] Date of Birth: 1970/08/02 Today's Date: 06/17/2023 HPI/PMH: HPI: KIMOTHY KISHIMOTO is 53 y.o. male with GERD, polysubstance abuse, HTN, hyperlipidemia, squamous cell carcinoma of the supraglottis recently completed chemotherapy and radiation, now  PEG dependent, who was hospitalized at [facility] for pneumonia and sepsis.  Pt has had recurrent pneumonias.   He is now being admitted to the hospital with recurrent pneumonia.  CT neck lesion along the laryngeal surface of the epiglottis on the left, along the left aryepiglottic fold,  more inferiorly within the supraglottic larynx at midline and to the  left and extending inferiorly to the level of the false vocal cord.  This is consistent with a mucosal/submucosal neoplasm. The lesion  measures up to 8 mm in thickness and spans 3 cm in craniocaudal  dimension. The tumor partially effaces the left pyriform sinus.  Additionally, there is apparent infiltration of the paraglottic  space on the left. No definite involvement of the true vocal cords.  However, correlate with findings at pharyngolaryngoscopy.  Over the 5 days prior to arrival patient endorses increasing lethargy, cough, and sputum production. CXR 06/16/23 New right middle lobe infiltrate, suspicious for pneumonia. CT chest 2/21 Findings of bronchitis with medial  bilateral lower lobe tree-in-bud and ground-glass nodules, likely infectious/inflammatory.  Supraglottic (stage III (cT3, cN1, cM0)) in January 2025 who started radiation on 04/14/2023, chemotherapy with cisplatin on 04/19/2023, s/p Port-A-Cath and G-tube placement. Clinical Impression: Clinical Impression: Patient presents with functional oropharyngeal swallow ability without aspiration or frank penetration of any consistency tested.  He does have minimal difficulties orally likely due to xerostomia for which he can compensate.  He is observed to piecemeal which is compensatory for him.  Pharyngeal swallow is timely with initiation and strong without any retention.  Barium tablet taken within readily cleared oral pharynx.  Patient does endorse reflux problems that are significant and states he takes reflux medication at home including a PPI.  Per chart review from office visit on 05/28/2023 patient was on Protonix 40 mg twice daily and famotidine 40 mg at night and he request to return to this medication protocol.  SLP will follow-up very briefly for education and encouragement to continue p.o. intake and exercises to decrease disuse muscle atrophy and impact of radiation/fibrosis.  Patient was educated to findings using teach back following MBS. Factors that may increase risk of adverse event in presence of aspiration Rubye Oaks & Clearance Coots 2021): Factors that may increase risk of adverse event in presence of aspiration Rubye Oaks & Clearance Coots 2021): Reduced saliva Recommendations/Plan: Swallowing Evaluation Recommendations Swallowing Evaluation Recommendations Recommendations: PO diet PO Diet Recommendation: Regular; Thin liquids (Level 0) Liquid Administration via: Cup; Straw Medication Administration: Whole meds with liquid Supervision: Patient able to self-feed Swallowing strategies  : -- (Start all intake with liquids) Postural changes: Stay upright 30-60 min after meals; Position pt fully upright for meals Oral care  recommendations: Oral care BID (2x/day) Treatment Plan Treatment Plan Treatment recommendations: Therapy as outlined in treatment plan below Follow-up recommendations: Outpatient SLP Functional status assessment: Patient has not had a recent decline in their functional status. Treatment frequency: Min 1x/week Treatment duration: 1 week Interventions: Compensatory techniques; Patient/family education Recommendations Recommendations for follow up therapy are one component of a multi-disciplinary discharge planning process, led by the attending physician.  Recommendations may be updated based on patient status, additional functional criteria and insurance authorization. Assessment: Orofacial Exam: Orofacial Exam Oral Cavity: Oral Hygiene: Xerostomia Oral Cavity - Dentition: Adequate natural dentition Orofacial Anatomy: WFL Oral Motor/Sensory Function: WFL Anatomy: Anatomy: WFL Boluses Administered: Boluses Administered Boluses Administered: Thin liquids (Level 0); Mildly thick liquids (Level 2, nectar thick); Moderately thick liquids (Level 3, honey thick); Puree; Solid  Oral Impairment Domain:  Oral Impairment Domain Lip Closure: No labial escape Tongue control during bolus hold: Cohesive bolus between tongue to palatal seal Bolus preparation/mastication: Slow prolonged chewing/mashing with complete recollection Bolus transport/lingual motion: Delayed initiation of tongue motion (oral holding) Oral residue: Residue collection on oral structures; Trace residue lining oral structures Location of oral residue : Tongue Initiation of pharyngeal swallow : Pyriform sinuses; Valleculae  Pharyngeal Impairment Domain: Pharyngeal Impairment Domain Soft palate elevation: No bolus between soft palate (SP)/pharyngeal wall (PW) Laryngeal elevation: Complete superior movement of thyroid cartilage with complete approximation of arytenoids to epiglottic petiole Anterior hyoid excursion: Complete anterior movement Epiglottic movement:  Complete inversion Laryngeal vestibule closure: Incomplete, narrow column air/contrast in laryngeal vestibule Pharyngeal stripping wave : Present - complete Pharyngeal contraction (A/P view only): N/A Pharyngoesophageal segment opening: Complete distension and complete duration, no obstruction of flow (?  Appearance of developing cervical web) Tongue base retraction: No contrast between tongue base and posterior pharyngeal wall (PPW) Pharyngeal residue: Complete pharyngeal clearance  Esophageal Impairment Domain: Esophageal Impairment Domain Esophageal clearance upright position: -- (Barium tablet appeared to help in the esophagus suspected near aortic arch without patient awareness.  Extra bolus of liquid appeared to readily facilitate clearance into the stomach.  Radiologist not present to confirm.) Pill: Pill Consistency administered: Thin liquids (Level 0) Thin liquids (Level 0): [facility] Penetration/Aspiration Scale Score: Penetration/Aspiration Scale Score 1.  Material does not enter airway: Mildly thick liquids (Level 2, nectar thick); Moderately thick liquids (Level 3, honey thick); Puree; Solid; Pill 2.  Material enters airway, remains ABOVE vocal cords then ejected out: Thin liquids (Level 0) Compensatory Strategies: Compensatory Strategies Compensatory strategies: No   General Information: Caregiver present: No  Diet Prior to this Study: Dysphagia 3 (mechanical soft); Thin liquids (Level 0)   Temperature : Normal   Respiratory Status: WFL   Supplemental O2: None (Room air)   History of Recent Intubation: No  Behavior/Cognition: Alert; Cooperative Self-Feeding Abilities: Able to self-feed Baseline vocal quality/speech: Normal Volitional Cough: -- (Did not test due to concern for pain) Volitional Swallow: -- (Did not test due to concern for pain and xerostomia) Exam Limitations: No limitations Goal Planning: Prognosis for improved oropharyngeal function: Good Barriers to Reach Goals: -- (Gustatory changes) No  data recorded Patient/Family Stated Goal: Wants to get better Consulted and agree with results and recommendations: Patient Pain: Pain Assessment Pain Assessment: Faces Faces Pain Scale: 4 Pain Location: Left knee Pain Descriptors / Indicators: Aching; Moaning Pain Intervention(s): Limited activity within patient's tolerance End of Session: Start Time:SLP Start Time (ACUTE ONLY): 0815 Stop Time: SLP Stop Time (ACUTE ONLY): 0839 Time Calculation:SLP Time Calculation (min) (ACUTE ONLY): 24 min Charges: SLP Evaluations $ SLP Speech Visit: 1 Visit SLP Evaluations $MBS Swallow: 1 Procedure SLP visit diagnosis: SLP Visit Diagnosis: Dysphagia, oral phase (R13.11) Past Medical History: Past Medical History: Diagnosis Date  Anxiety   Bipolar 1 disorder (HCC)   EtOH dependence (HCC)   QUIT  GERD without esophagitis   Hepatitis C carrier (HCC)   Hypercholesteremia   Hypertension   laryngeal ca 02/2023  Otitis media 02/04/2023  Polysubstance dependence (HCC)   COCAINE- quit  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  Substance induced mood disorder (HCC) 11/05/2016  Syncope 05/21/2023 Past Surgical History: Past Surgical History: Procedure Laterality Date  ANEURYSM COILING    AGE 69 x2  APPENDECTOMY  BIOPSY  01/05/2017  Procedure: BIOPSY;  Surgeon: West Bali, MD;  Location: [facility];  Service: Endoscopy;;  Duodenal and Gastric  COLONOSCOPY WITH PROPOFOL N/A 01/05/2017  Procedure: COLONOSCOPY WITH PROPOFOL;  Surgeon: West Bali, MD;  Location: [facility];  Service: Endoscopy;  Laterality: N/A;  10:00am  DIRECT LARYNGOSCOPY Left 03/16/2023  Procedure: Direct laryngoscopy with biopsy of supraglottic mass;  Surgeon: Newman Pies, MD;  Location: [facility];  Service: ENT;  Laterality: Left;  ESOPHAGOGASTRODUODENOSCOPY (EGD) WITH PROPOFOL N/A 01/05/2017  Procedure:  ESOPHAGOGASTRODUODENOSCOPY (EGD) WITH PROPOFOL;  Surgeon: West Bali, MD;  Location: [facility];  Service: Endoscopy;  Laterality: N/A;  IR GASTROSTOMY TUBE MOD SED  04/28/2023  IR IMAGING GUIDED PORT INSERTION  04/28/2023  IR PATIENT EVAL TECH 0-60 MINS  05/03/2023  LAPAROSCOPIC APPENDECTOMY    MASS EXCISION Right 01/13/2017  Procedure: EXCISION LIPOMA RIGHT ARM;  Surgeon: Lucretia Roers, MD;  Location: [facility];  Service: General;  Laterality: Right; Rolena Infante, MS [facility] SLP Acute Rehab Services Office [phone] Chales Abrahams 06/17/2023, 9:07 AM  VAS [place] LOWER EXTREMITY VENOUS (DVT) Result Date: 06/15/2023  Lower Venous DVT Study Patient Name:  EMIT KUENZEL  Date of Exam:   06/15/2023 Medical Rec #: [id number]       Accession #:    [id number] Date of Birth: 12-Jul-1970      Patient Gender: M Patient Age:   54 years Exam Location:  [facility] Procedure:      VAS [place] LOWER EXTREMITY VENOUS (DVT) Referring Phys: Willeen Niece --------------------------------------------------------------------------------  Indications: Pain.  Risk Factors: Cancer. Comparison Study: No prior studies. Performing Technologist: Chanda Busing RVT  Examination Guidelines: A complete evaluation includes B-mode imaging, spectral Doppler, color Doppler, and power Doppler as needed of all accessible portions of each vessel. Bilateral testing is considered an integral part of a complete examination. Limited examinations for reoccurring indications may be performed as noted. The reflux portion of the exam is performed with the patient in reverse Trendelenburg.  +-----+---------------+---------+-----------+----------+--------------+ RIGHTCompressibilityPhasicitySpontaneityPropertiesThrombus Aging +-----+---------------+---------+-----------+----------+--------------+ CFV  Full           Yes      Yes                                 +-----+---------------+---------+-----------+----------+--------------+    +---------+---------------+---------+-----------+----------+--------------+ LEFT     CompressibilityPhasicitySpontaneityPropertiesThrombus Aging +---------+---------------+---------+-----------+----------+--------------+ CFV      Full           Yes      Yes                                 +---------+---------------+---------+-----------+----------+--------------+ SFJ      Full                                                        +---------+---------------+---------+-----------+----------+--------------+ FV Prox  Full                                                        +---------+---------------+---------+-----------+----------+--------------+  FV Mid   Full                                                        +---------+---------------+---------+-----------+----------+--------------+ FV DistalFull                                                        +---------+---------------+---------+-----------+----------+--------------+ PFV      Full                                                        +---------+---------------+---------+-----------+----------+--------------+ POP      Full           Yes      Yes                                 +---------+---------------+---------+-----------+----------+--------------+ PTV      Full                                                        +---------+---------------+---------+-----------+----------+--------------+ PERO     Full                                                        +---------+---------------+---------+-----------+----------+--------------+    Summary: RIGHT: - No evidence of common femoral vein obstruction.   LEFT: - There is no evidence of deep vein thrombosis in the lower extremity.  - No cystic structure found in the popliteal fossa.  *See table(s) above for measurements and observations. Electronically signed by Lemar Livings MD on 06/15/2023 at 3:52:51 PM.    Final    DG Chest 2  View Result Date: 06/14/2023 CLINICAL DATA:  Productive cough for several days. Laryngeal carcinoma. EXAM: CHEST - 2 VIEW COMPARISON:  05/21/2023 FINDINGS: The heart size and mediastinal contours are within normal limits. Right-sided Port-A-Cath remains in place. New airspace disease is seen in the inferior right middle lobe, suspicious for pneumonia. Left lung is clear. No pleural effusion. The visualized skeletal structures are unremarkable. IMPRESSION: New right middle lobe infiltrate, suspicious for pneumonia. Recommend continued chest radiographic follow-up to confirm resolution. Electronically Signed   By: Danae Orleans M.D.   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 We discussed ways to help with dry mouth and oral secretions.   2) Nutritional Status: Stable. Seeing nutrition later today.  Wt Readings from Last 3 Encounters:  06/22/23 137 lb (62.1  kg)  06/17/23 134 lb 14.7 oz (61.2 kg)  06/08/23 140 lb 12.8 oz (63.9 kg)  PEG tube: In-place  3) Risk Factors: The patient has been educated about risk factors including alcohol and tobacco abuse; they understand that avoidance of alcohol and tobacco is important to prevent recurrences as well as other cancers  4) Swallowing: Able to tolerate softer foods. He is following up with SLP and PT.   5) Dental: Encouraged to continue regular followup with dentistry, and dental hygiene including fluoride rinses.   6) Thyroid function: Checking annually.  Lab Results  Component Value Date   TSH 1.058 04/16/2023    8) Per NCCN guidelines, we will proceed with a PET in 2.5 months with an office visit to follow. The patient was encouraged to call with any issues or questions before then.  On date of service, in total, I spent 20  minutes on this encounter. Patient was seen in person. _____________________________________    Bryan Lemma, PA-C

## 2023-06-22 NOTE — Progress Notes (Signed)
 Nutrition Follow-up:   Patient with SCC of supraglottis, stage III. Patient receiving concurrent chemotherapy and radiation. Final RT 3/11  Admissions: 3/17-3/20 with [facility] 2/21-2/27 with acute DVT of IJV, CAP, sepsis, syncope, anemia.    Met with pt and wife in office. Pt reports feeling a little better every day. His taste has not returned which makes po challenging. Odynophagia with solids continues. He is drinking water and some juices by mouth. Pt is tolerating pump feedings well. He reports 80 ml/hr x 16 hrs most days (6PM-10AM). Pt denies nausea, vomiting, diarrhea, constipation.    Medications: reviewed   Labs: 3/20 labs reviewed   Anthropometrics: Wt 137 lb today   3/11 - 140 lb 12.8 oz  3/03 - 139 lb    Estimated Energy Needs  Kcals: 2100-2450 Protein: 105-122 Fluid: >2.1 L  NUTRITION DIAGNOSIS: Predicted suboptimal intake ongoing - addressing via TF   INTERVENTION:  Pt will increase TF to 90 ml/hr x 16 hours Encourage daily po trials + completing HEP as prescribed per SLP Suggested resuming baking soda salt water rinses - specifically before po to aid with diminished taste Will continue working with pt to assess readiness to wean from tube Pt to obtain weekly wt on home scale   MONITORING, EVALUATION, GOAL: wt trends, intake, TF   NEXT VISIT: Tuesday April 8 via telephone

## 2023-06-23 ENCOUNTER — Other Ambulatory Visit: Payer: Self-pay

## 2023-06-25 ENCOUNTER — Inpatient Hospital Stay: Admitting: Oncology

## 2023-06-25 ENCOUNTER — Encounter: Payer: Self-pay | Admitting: Oncology

## 2023-06-25 ENCOUNTER — Inpatient Hospital Stay

## 2023-06-25 VITALS — BP 127/81 | HR 83 | Temp 98.4°F | Resp 18 | Ht 64.0 in | Wt 132.7 lb

## 2023-06-25 DIAGNOSIS — I700 Atherosclerosis of aorta: Secondary | ICD-10-CM | POA: Diagnosis not present

## 2023-06-25 DIAGNOSIS — Z923 Personal history of irradiation: Secondary | ICD-10-CM | POA: Diagnosis not present

## 2023-06-25 DIAGNOSIS — R110 Nausea: Secondary | ICD-10-CM | POA: Diagnosis not present

## 2023-06-25 DIAGNOSIS — Z95828 Presence of other vascular implants and grafts: Secondary | ICD-10-CM

## 2023-06-25 DIAGNOSIS — Z7901 Long term (current) use of anticoagulants: Secondary | ICD-10-CM | POA: Diagnosis not present

## 2023-06-25 DIAGNOSIS — K219 Gastro-esophageal reflux disease without esophagitis: Secondary | ICD-10-CM | POA: Diagnosis not present

## 2023-06-25 DIAGNOSIS — C321 Malignant neoplasm of supraglottis: Secondary | ICD-10-CM

## 2023-06-25 DIAGNOSIS — Z79899 Other long term (current) drug therapy: Secondary | ICD-10-CM | POA: Diagnosis not present

## 2023-06-25 DIAGNOSIS — Z9221 Personal history of antineoplastic chemotherapy: Secondary | ICD-10-CM | POA: Diagnosis not present

## 2023-06-25 DIAGNOSIS — D72819 Decreased white blood cell count, unspecified: Secondary | ICD-10-CM | POA: Diagnosis not present

## 2023-06-25 DIAGNOSIS — I82C11 Acute embolism and thrombosis of right internal jugular vein: Secondary | ICD-10-CM

## 2023-06-25 DIAGNOSIS — Z7952 Long term (current) use of systemic steroids: Secondary | ICD-10-CM | POA: Diagnosis not present

## 2023-06-25 LAB — CBC WITH DIFFERENTIAL (CANCER CENTER ONLY)
Abs Immature Granulocytes: 0.02 10*3/uL (ref 0.00–0.07)
Basophils Absolute: 0 10*3/uL (ref 0.0–0.1)
Basophils Relative: 1 %
Eosinophils Absolute: 0.1 10*3/uL (ref 0.0–0.5)
Eosinophils Relative: 1 %
HCT: 29.7 % — ABNORMAL LOW (ref 39.0–52.0)
Hemoglobin: 9.8 g/dL — ABNORMAL LOW (ref 13.0–17.0)
Immature Granulocytes: 0 %
Lymphocytes Relative: 11 %
Lymphs Abs: 0.7 10*3/uL (ref 0.7–4.0)
MCH: 29.8 pg (ref 26.0–34.0)
MCHC: 33 g/dL (ref 30.0–36.0)
MCV: 90.3 fL (ref 80.0–100.0)
Monocytes Absolute: 0.7 10*3/uL (ref 0.1–1.0)
Monocytes Relative: 12 %
Neutro Abs: 4.5 10*3/uL (ref 1.7–7.7)
Neutrophils Relative %: 75 %
Platelet Count: 349 10*3/uL (ref 150–400)
RBC: 3.29 MIL/uL — ABNORMAL LOW (ref 4.22–5.81)
RDW: 15.5 % (ref 11.5–15.5)
WBC Count: 6.1 10*3/uL (ref 4.0–10.5)
nRBC: 0 % (ref 0.0–0.2)

## 2023-06-25 LAB — CMP (CANCER CENTER ONLY)
ALT: 8 U/L (ref 0–44)
AST: 11 U/L — ABNORMAL LOW (ref 15–41)
Albumin: 3.9 g/dL (ref 3.5–5.0)
Alkaline Phosphatase: 85 U/L (ref 38–126)
Anion gap: 9 (ref 5–15)
BUN: 12 mg/dL (ref 6–20)
CO2: 27 mmol/L (ref 22–32)
Calcium: 9.4 mg/dL (ref 8.9–10.3)
Chloride: 100 mmol/L (ref 98–111)
Creatinine: 1.02 mg/dL (ref 0.61–1.24)
GFR, Estimated: >60 mL/min (ref >60)
Glucose, Bld: 95 mg/dL (ref 70–99)
Potassium: 4.2 mmol/L (ref 3.5–5.1)
Sodium: 136 mmol/L (ref 135–145)
Total Bilirubin: 0.3 mg/dL (ref 0.0–1.2)
Total Protein: 7.8 g/dL (ref 6.5–8.1)

## 2023-06-25 LAB — MAGNESIUM: Magnesium: 2.2 mg/dL (ref 1.7–2.4)

## 2023-06-25 MED ORDER — SODIUM CHLORIDE 0.9% FLUSH
10.0000 mL | Freq: Once | INTRAVENOUS | Status: AC
  Administered 2023-06-25: 10 mL

## 2023-06-25 MED ORDER — HEPARIN SOD (PORK) LOCK FLUSH 100 UNIT/ML IV SOLN
500.0000 [IU] | Freq: Once | INTRAVENOUS | Status: AC
  Administered 2023-06-25: 500 [IU]

## 2023-06-25 NOTE — Progress Notes (Signed)
 [facility]  ONCOLOGY CLINIC PROGRESS NOTE   Patient Care Team: [name], MD as PCP - General (Internal Medicine) West Bali, MD (Inactive) as Consulting Physician (Gastroenterology) Lonie Peak, MD as Attending Physician (Radiation Oncology) Malmfelt, Lise Auer, RN as Oncology Nurse Navigator Meryl Crutch, MD as Consulting Physician (Oncology) Karle Barr, MD as Referring Physician (Otolaryngology)  PATIENT NAME: [name]   MR#: [id number] DOB: [birth year]  Date of visit: 06/25/2023   ASSESSMENT & PLAN:   [name] is a 53 y.o. 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   Malignant neoplasm of supraglottis (HCC) Supraglottic cancer with one suspicious 7 mm lymph node on PET scan, classified as T3, N1, M0, stage III, low volume disease.   Discussed his case in our tumor conference on 04/14/2023.  Based on imaging studies, it is classified as cT3 lesion.  cN1.  Hence plan is to proceed with concurrent chemoradiation.  We have discussed about role of cisplatin being a radiosensitizer in the treatment of head and neck cancer.  We have discussed about the curative intent of chemoradiation for this patient.     -He began radiation treatments from 04/14/2023.  Began chemotherapy with cisplatin from 04/19/2023 and completed 7 doses as of 06/08/2023.  Starting from cycle 6, we dose reduce cisplatin to 30 mg/m because of cytopenias.  He completed radiation treatments on 06/08/2023.  He slowly recovering from chemoradiation related side effects.  Steadily making progress.  Labs today reveal normal white count and platelet count.  Hemoglobin slowly improving at 9.8.  Creatinine normal at 1.02.  No electrolyte disturbances.  Going forward we will continue surveillance as per NCCN  guidelines.  He has a PET scan scheduled in June 2025.  I will plan to see him after that with results.  Internal jugular (IJ) vein thromboembolism, acute, right (/Acute DVT of Right IJV- acute occlusive thrombus throughout the right internal jugular vein. Found during recent hospitalization.  He is currently on Eliquis 5 mg p.o. twice daily.  Plan to continue anticoagulation at least for 3 months followed by reevaluation.    I reviewed lab results and outside records for this visit and discussed relevant results with the patient. Diagnosis, plan of care and treatment options were also discussed in detail with the patient. Opportunity provided to ask questions and answers provided to his apparent satisfaction. Provided instructions to call our clinic with any problems, questions or concerns prior to return visit. I recommended to continue follow-up with PCP and sub-specialists. He verbalized understanding and agreed with the plan.   NCCN guidelines have been consulted in the planning of this patient's care.  I spent a total of 30 minutes during this encounter with the patient including review of chart and various tests results, discussions about plan of care and coordination of care plan.   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   Current Treatment: Concurrent chemoradiation with weekly cisplatin.  Radiation started from 04/14/2023.  Cisplatin  started from 04/19/2023.  INTERVAL HISTORY:    Discussed the use of AI scribe software for clinical note transcription with the patient, who gave verbal consent to proceed.   [name] is here today  for repeat clinical assessment.   He reports feeling better overall, with a noticeable improvement in his voice, which was previously affected. However, he still experiences some discomfort when swallowing and coughing, which he attributes to irritation from the treatment. He anticipates further improvement over the next month.  The patient also reports a gradual return of his sense of taste, which was previously affected. He is currently maintaining hydration and nutrition via a feeding tube, with the goal of resuming oral intake once he is able to eat and drink enough through the mouth.  I have reviewed the past medical history, past surgical history, social history and family history with the patient and they are unchanged from previous note.  HISTORY OF PRESENT ILLNESS:   ONCOLOGY HISTORY:   He initially presented to the [facility] on 10/28/22 with c/o left ear pain, popping with chewing, and pain radiating down the side of his neck and throat x 1 month. He was treated for a left ear infection with a course of prednisone.   His symptoms including left ear and throat pain however persisted and progressed and he was subsequently referred to Dr. Suszanne Conners on 03/10/23 at [facility] for further evaluation. During which time, the patient detailed that his pain first began in the ear and eventually progressed to involve the left side of his throat.  A laryngoscopy was subsequently performed at that time which revealed an ulcerative lesion on the posterior aspect of the epiglottis, superior to the vocal cords. The arytenoid mucosa was also mildly edematous and the true vocal folds were pale yellow, mobile, and without mass or lesion.       He accordingly underwent a direct laryngoscopy with biopsies of the left supraglottic tumor/mass on 03/16/23. Pathology showed findings consistent with invasive and in situ moderately differentiated squamous cell carcinoma. Per note by Dr Suszanne Conners, "Examination of the  vallecula, piriform sinuses, and the pharyngeal mucosa were all normal. A fungating mass was noted on the posterior aspect of the left epiglottis. The mass did not appear to involve the vocal cords."   Soft tissue neck CT and PET scan on 03/23/23 were done - demonstrating a left supraglottic lesion which may involve the glottis as well as a 7 mm left level 3 lymph node that is suspicious for metastatic involvement.  No distant metastatic disease.  cT3,cN1,cM0, Stage III disease.   Plan made to proceed with concurrent chemoradiation with weekly cisplatin.  Received concurrent chemoradiation starting during the week of 04/14/2023 and completed treatments on 06/08/2023.  Oncology History  Malignant neoplasm of supraglottis (HCC)  03/22/2023 Initial Diagnosis   Malignant neoplasm of supraglottis (HCC)   03/26/2023 Cancer Staging   Staging form: Larynx - Supraglottis, AJCC 8th Edition - Clinical stage from 03/26/2023: Stage III (cT3, cN1, cM0) - Signed by Lonie Peak, MD on 04/14/2023 Stage prefix: Initial diagnosis   04/19/2023 -  Chemotherapy   Patient is on Treatment Plan : HEAD/NECK Cisplatin (40) q7d         REVIEW OF SYSTEMS:   Review of Systems - Oncology  All other pertinent systems were reviewed with the patient and are negative.  ALLERGIES: He has no known allergies.  MEDICATIONS:  Current Outpatient Medications  Medication Sig Dispense Refill   acetaminophen (TYLENOL)  500 MG tablet Take 1,000 mg by mouth at bedtime as needed for mild pain (pain score 1-3) or moderate pain (pain score 4-6).     apixaban (ELIQUIS) 5 MG TABS tablet Take 5 mg by mouth in the morning and at bedtime 388 tablet 0   dexamethasone (DECADRON) 4 MG tablet Take 2 tablets (8 mg) by mouth daily x 3 days starting the day after cisplatin chemotherapy. Take with food. 30 tablet 1   famotidine (PEPCID) 40 MG tablet Take 1 tablet (40 mg total) by mouth at bedtime.     FLUoxetine (PROZAC) 20 MG capsule Take 1  capsule (20 mg total) by mouth daily. 30 capsule 3   guaiFENesin-dextromethorphan (ROBITUSSIN DM) 100-10 MG/5ML syrup Place 15 mLs into feeding tube every 6 (six) hours. (Patient taking differently: Take 15 mLs by mouth every 6 (six) hours.) 118 mL 0   hydrOXYzine (ATARAX) 25 MG tablet Take 1 tablet (25 mg total) by mouth 3 (three) times daily as needed for anxiety. (Patient taking differently: Take 25 mg by mouth at bedtime.) 90 tablet 1   lidocaine (XYLOCAINE) 2 % solution Patient: Mix 1part 2% viscous lidocaine, 1part H20. Swallow 10mL of diluted mixture, before meals and at bedtime, up to QID 200 mL 3   lidocaine-prilocaine (EMLA) cream Apply to affected area once (Patient taking differently: Apply 1 Application topically as needed (port).) 30 g 3   metoprolol tartrate (LOPRESSOR) 25 MG tablet Take 0.5 tablets (12.5 mg total) by mouth 2 (two) times daily. 60 tablet 1   Nutritional Supplements (NUTREN 1.5) LIQD 37ml/hr for 18 hours/day (start at 3pm and stop feeding at 9am).  Flush of water at 3pm, 7pm, 11pm and 9am.     ondansetron (ZOFRAN) 8 MG tablet Take 1 tablet (8 mg total) by mouth every 8 (eight) hours as needed for nausea or vomiting. Start on the third day after cisplatin. 30 tablet 1   oxyCODONE 10 MG TABS Take 1 tablet (10 mg total) by mouth every 6 (six) hours as needed for severe pain (pain score 7-10). 60 tablet 0   pantoprazole (PROTONIX) 40 MG tablet Take 1 tablet (40 mg total) by mouth 2 (two) times daily. 60 tablet 2   prochlorperazine (COMPAZINE) 10 MG tablet TAKE 1 TABLET(10 MG) BY MOUTH EVERY 6 HOURS AS NEEDED FOR NAUSEA OR VOMITING 30 tablet 1   Protein (FEEDING SUPPLEMENT, PROSOURCE TF20,) liquid Place 60 mLs into feeding tube daily. 1800 mL 0   QUEtiapine (SEROQUEL) 100 MG tablet Take 1 tablet (100 mg total) by mouth at bedtime. (Patient not taking: Reported on 06/25/2023) 30 tablet 1   sucralfate (CARAFATE) 1 GM/10ML suspension Place 10 mLs (1 g total) into  feeding tube in the morning, at noon, and at bedtime for 20 days. (Patient not taking: Reported on 06/14/2023) 600 mL 0   No current facility-administered medications for this visit.     VITALS:   Blood pressure 127/81, pulse 83, temperature 98.4 F (36.9 C), temperature source Temporal, resp. rate 18, height 5\' 4"  (1.626 m), weight 132 lb 11.2 oz (60.2 kg), SpO2 98%.  Wt Readings from Last 3 Encounters:  06/25/23 132 lb 11.2 oz (60.2 kg)  06/22/23 137 lb (62.1 kg)  06/17/23 134 lb 14.7 oz (61.2 kg)    Body mass index is 22.78 kg/m.   Onc Performance Status - 06/25/23 1452       ECOG Perf Status   ECOG Perf Status Fully active, able to carry on  all pre-disease performance without restriction      KPS SCALE   KPS % SCORE Normal activity with effort, some s/s of disease              PHYSICAL EXAM:   Physical Exam Constitutional:      General: He is not in acute distress.    Appearance: Normal appearance.  HENT:     Head: Normocephalic and atraumatic.     Mouth/Throat:     Comments: Mild erythema in the back of the throat. Eyes:     General: No scleral icterus.    Conjunctiva/sclera: Conjunctivae normal.  Neck:     Comments: Radiation associated skin changes are slowly improving. Cardiovascular:     Rate and Rhythm: Normal rate and regular rhythm.     Heart sounds: Normal heart sounds.  Pulmonary:     Effort: Pulmonary effort is normal.     Breath sounds: Normal breath sounds.  Chest:     Comments: Port-A-Cath in place without any signs of infection Abdominal:     General: There is no distension.     Comments: Feeding tube in place, bandaged.  Cellulitis has resolved.  Musculoskeletal:     Right lower leg: No edema.     Left lower leg: No edema.  Neurological:     General: No focal deficit present.     Mental Status: He is alert and oriented to person, place, and time.  Psychiatric:        Mood and Affect: Mood normal.        Behavior: Behavior normal.         Thought Content: Thought content normal.       LABORATORY DATA:   I have reviewed the data as listed.  Results for orders placed or performed in visit on 06/25/23  Magnesium  Result Value Ref Range   Magnesium 2.2 1.7 - 2.4 mg/dL  CBC with Differential (Cancer Center Only)  Result Value Ref Range   WBC Count 6.1 4.0 - 10.5 K/uL   RBC 3.29 (L) 4.22 - 5.81 MIL/uL   Hemoglobin 9.8 (L) 13.0 - 17.0 g/dL   HCT 21.3 (L) 08.6 - 57.8 %   MCV 90.3 80.0 - 100.0 fL   MCH 29.8 26.0 - 34.0 pg   MCHC 33.0 30.0 - 36.0 g/dL   RDW 46.9 62.9 - 52.8 %   Platelet Count 349 150 - 400 K/uL   nRBC 0.0 0.0 - 0.2 %   Neutrophils Relative % 75 %   Neutro Abs 4.5 1.7 - 7.7 K/uL   Lymphocytes Relative 11 %   Lymphs Abs 0.7 0.7 - 4.0 K/uL   Monocytes Relative 12 %   Monocytes Absolute 0.7 0.1 - 1.0 K/uL   Eosinophils Relative 1 %   Eosinophils Absolute 0.1 0.0 - 0.5 K/uL   Basophils Relative 1 %   Basophils Absolute 0.0 0.0 - 0.1 K/uL   Immature Granulocytes 0 %   Abs Immature Granulocytes 0.02 0.00 - 0.07 K/uL  CMP (Cancer Center only)  Result Value Ref Range   Sodium 136 135 - 145 mmol/L   Potassium 4.2 3.5 - 5.1 mmol/L   Chloride 100 98 - 111 mmol/L   CO2 27 22 - 32 mmol/L   Glucose, Bld 95 70 - 99 mg/dL   BUN 12 6 - 20 mg/dL   Creatinine 4.13 2.44 - 1.24 mg/dL   Calcium 9.4 8.9 - 01.0 mg/dL   Total Protein 7.8 6.5 - 8.1  g/dL   Albumin 3.9 3.5 - 5.0 g/dL   AST 11 (L) 15 - 41 U/L   ALT 8 0 - 44 U/L   Alkaline Phosphatase 85 38 - 126 U/L   Total Bilirubin 0.3 0.0 - 1.2 mg/dL   GFR, Estimated >98 >11 mL/min   Anion gap 9 5 - 15     RADIOGRAPHIC STUDIES:  I have personally reviewed the radiological images as listed and agree with the findings in the report.  DG Swallowing Func-Speech Pathology Result Date: 06/17/2023 Table formatting from the original result was not included. Modified Barium Swallow Study Patient Details Name: HERALD VALLIN MRN: [id number] Date of Birth:  10/16/1970 Today's Date: 06/17/2023 HPI/PMH: HPI: DABID GODOWN is 54 y.o. male with GERD, polysubstance abuse, HTN, hyperlipidemia, squamous cell carcinoma of the supraglottis recently completed chemotherapy and radiation, now PEG dependent, who was hospitalized at [facility] for pneumonia and sepsis.  Pt has had recurrent pneumonias.   He is now being admitted to the hospital with recurrent pneumonia.  CT neck lesion along the laryngeal surface of the epiglottis on the left, along the left aryepiglottic fold,  more inferiorly within the supraglottic larynx at midline and to the  left and extending inferiorly to the level of the false vocal cord.  This is consistent with a mucosal/submucosal neoplasm. The lesion  measures up to 8 mm in thickness and spans 3 cm in craniocaudal  dimension. The tumor partially effaces the left pyriform sinus.  Additionally, there is apparent infiltration of the paraglottic  space on the left. No definite involvement of the true vocal cords.  However, correlate with findings at pharyngolaryngoscopy.  Over the 5 days prior to arrival patient endorses increasing lethargy, cough, and sputum production. CXR 06/16/23 New right middle lobe infiltrate, suspicious for pneumonia. CT chest 2/21 Findings of bronchitis with medial bilateral lower lobe tree-in-bud and ground-glass nodules, likely infectious/inflammatory.  Supraglottic (stage III (cT3, cN1, cM0)) in January 2025 who started radiation on 04/14/2023, chemotherapy with cisplatin on 04/19/2023, s/p Port-A-Cath and G-tube placement. Clinical Impression: Clinical Impression: Patient presents with functional oropharyngeal swallow ability without aspiration or frank penetration of any consistency tested.  He does have minimal difficulties orally likely due to xerostomia for which he can compensate.  He is observed to piecemeal which is compensatory for him.  Pharyngeal swallow is timely with initiation and strong without any  retention.  Barium tablet taken within readily cleared oral pharynx.  Patient does endorse reflux problems that are significant and states he takes reflux medication at home including a PPI.  Per chart review from office visit on 05/28/2023 patient was on Protonix 40 mg twice daily and famotidine 40 mg at night and he request to return to this medication protocol.  SLP will follow-up very briefly for education and encouragement to continue p.o. intake and exercises to decrease disuse muscle atrophy and impact of radiation/fibrosis.  Patient was educated to findings using teach back following MBS. Factors that may increase risk of adverse event in presence of aspiration Rubye Oaks & Clearance Coots 2021): Factors that may increase risk of adverse event in presence of aspiration Rubye Oaks & Clearance Coots 2021): Reduced saliva Recommendations/Plan: Swallowing Evaluation Recommendations Swallowing Evaluation Recommendations Recommendations: PO diet PO Diet Recommendation: Regular; Thin liquids (Level 0) Liquid Administration via: Cup; Straw Medication Administration: Whole meds with liquid Supervision: Patient able to self-feed Swallowing strategies  : -- (Start all intake with liquids) Postural changes: Stay upright 30-60 min after meals; Position pt fully  upright for meals Oral care recommendations: Oral care BID (2x/day) Treatment Plan Treatment Plan Treatment recommendations: Therapy as outlined in treatment plan below Follow-up recommendations: Outpatient SLP Functional status assessment: Patient has not had a recent decline in their functional status. Treatment frequency: Min 1x/week Treatment duration: 1 week Interventions: Compensatory techniques; Patient/family education Recommendations Recommendations for follow up therapy are one component of a multi-disciplinary discharge planning process, led by the attending physician.  Recommendations may be updated based on patient status, additional functional criteria and insurance  authorization. Assessment: Orofacial Exam: Orofacial Exam Oral Cavity: Oral Hygiene: Xerostomia Oral Cavity - Dentition: Adequate natural dentition Orofacial Anatomy: WFL Oral Motor/Sensory Function: WFL Anatomy: Anatomy: WFL Boluses Administered: Boluses Administered Boluses Administered: Thin liquids (Level 0); Mildly thick liquids (Level 2, nectar thick); Moderately thick liquids (Level 3, honey thick); Puree; Solid  Oral Impairment Domain: Oral Impairment Domain Lip Closure: No labial escape Tongue control during bolus hold: Cohesive bolus between tongue to palatal seal Bolus preparation/mastication: Slow prolonged chewing/mashing with complete recollection Bolus transport/lingual motion: Delayed initiation of tongue motion (oral holding) Oral residue: Residue collection on oral structures; Trace residue lining oral structures Location of oral residue : Tongue Initiation of pharyngeal swallow : Pyriform sinuses; Valleculae  Pharyngeal Impairment Domain: Pharyngeal Impairment Domain Soft palate elevation: No bolus between soft palate (SP)/pharyngeal wall (PW) Laryngeal elevation: Complete superior movement of thyroid cartilage with complete approximation of arytenoids to epiglottic petiole Anterior hyoid excursion: Complete anterior movement Epiglottic movement: Complete inversion Laryngeal vestibule closure: Incomplete, narrow column air/contrast in laryngeal vestibule Pharyngeal stripping wave : Present - complete Pharyngeal contraction (A/P view only): N/A Pharyngoesophageal segment opening: Complete distension and complete duration, no obstruction of flow (?  Appearance of developing cervical web) Tongue base retraction: No contrast between tongue base and posterior pharyngeal wall (PPW) Pharyngeal residue: Complete pharyngeal clearance  Esophageal Impairment Domain: Esophageal Impairment Domain Esophageal clearance upright position: -- (Barium tablet appeared to help in the esophagus suspected near aortic  arch without patient awareness.  Extra bolus of liquid appeared to readily facilitate clearance into the stomach.  Radiologist not present to confirm.) Pill: Pill Consistency administered: Thin liquids (Level 0) Thin liquids (Level 0): [facility] Penetration/Aspiration Scale Score: Penetration/Aspiration Scale Score 1.  Material does not enter airway: Mildly thick liquids (Level 2, nectar thick); Moderately thick liquids (Level 3, honey thick); Puree; Solid; Pill 2.  Material enters airway, remains ABOVE vocal cords then ejected out: Thin liquids (Level 0) Compensatory Strategies: Compensatory Strategies Compensatory strategies: No   General Information: Caregiver present: No  Diet Prior to this Study: Dysphagia 3 (mechanical soft); Thin liquids (Level 0)   Temperature : Normal   Respiratory Status: WFL   Supplemental O2: None (Room air)   History of Recent Intubation: No  Behavior/Cognition: Alert; Cooperative Self-Feeding Abilities: Able to self-feed Baseline vocal quality/speech: Normal Volitional Cough: -- (Did not test due to concern for pain) Volitional Swallow: -- (Did not test due to concern for pain and xerostomia) Exam Limitations: No limitations Goal Planning: Prognosis for improved oropharyngeal function: Good Barriers to Reach Goals: -- (Gustatory changes) No data recorded Patient/Family Stated Goal: Wants to get better Consulted and agree with results and recommendations: Patient Pain: Pain Assessment Pain Assessment: Faces Faces Pain Scale: 4 Pain Location: Left knee Pain Descriptors / Indicators: Aching; Moaning Pain Intervention(s): Limited activity within patient's tolerance End of Session: Start Time:SLP Start Time (ACUTE ONLY): 0815 Stop Time: SLP Stop Time (ACUTE ONLY): 0839 Time Calculation:SLP Time Calculation (min) (ACUTE ONLY): 24  min Charges: SLP Evaluations $ SLP Speech Visit: 1 Visit SLP Evaluations $MBS Swallow: 1 Procedure SLP visit diagnosis: SLP Visit Diagnosis: Dysphagia, oral phase  (R13.11) Past Medical History: Past Medical History: Diagnosis Date  Anxiety   Bipolar 1 disorder (HCC)   EtOH dependence (HCC)   QUIT  GERD without esophagitis   Hepatitis C carrier (HCC)   Hypercholesteremia   Hypertension   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  Substance induced mood disorder (HCC) 11/05/2016  Syncope 05/21/2023 Past Surgical History: Past Surgical History: Procedure Laterality Date  ANEURYSM COILING    AGE 89 x2  APPENDECTOMY    BIOPSY  01/05/2017  Procedure: BIOPSY;  Surgeon: West Bali, MD;  Location: [facility];  Service: Endoscopy;;  Duodenal and Gastric  COLONOSCOPY WITH PROPOFOL N/A 01/05/2017  Procedure: COLONOSCOPY WITH PROPOFOL;  Surgeon: West Bali, MD;  Location: [facility];  Service: Endoscopy;  Laterality: N/A;  10:00am  DIRECT LARYNGOSCOPY Left 03/16/2023  Procedure: Direct laryngoscopy with biopsy of supraglottic mass;  Surgeon: Newman Pies, MD;  Location: [facility];  Service: ENT;  Laterality: Left;  ESOPHAGOGASTRODUODENOSCOPY (EGD) WITH PROPOFOL N/A 01/05/2017  Procedure: ESOPHAGOGASTRODUODENOSCOPY (EGD) WITH PROPOFOL;  Surgeon: West Bali, MD;  [facility]: [facility];  Service: Endoscopy;  Laterality: N/A;  IR GASTROSTOMY TUBE MOD SED  04/28/2023  IR IMAGING GUIDED PORT INSERTION  04/28/2023  IR PATIENT EVAL TECH 0-60 MINS  05/03/2023  LAPAROSCOPIC APPENDECTOMY    MASS EXCISION Right 01/13/2017  Procedure: EXCISION LIPOMA RIGHT ARM;  Surgeon: Lucretia Roers, MD;  Location: [facility];  Service: General;  Laterality: Right; Rolena Infante, MS [facility] SLP Acute Rehab Services Office [phone] Chales Abrahams 06/17/2023, 9:07 AM  VAS [place] LOWER EXTREMITY VENOUS (DVT) Result Date: 06/15/2023  Lower Venous DVT Study Patient  Name:  CLAUDIS GIOVANELLI  Date of Exam:   06/15/2023 Medical Rec #: [id number]       Accession #:    [id number] Date of Birth: 1970-05-15      Patient Gender: M Patient Age:   89 years Exam Location:  [facility] Procedure:      VAS [place] LOWER EXTREMITY VENOUS (DVT) Referring Phys: Willeen Niece --------------------------------------------------------------------------------  Indications: Pain.  Risk Factors: Cancer. Comparison Study: No prior studies. Performing Technologist: Chanda Busing RVT  Examination Guidelines: A complete evaluation includes B-mode imaging, spectral Doppler, color Doppler, and power Doppler as needed of all accessible portions of each vessel. Bilateral testing is considered an integral part of a complete examination. Limited examinations for reoccurring indications may be performed as noted. The reflux portion of the exam is performed with the patient in reverse Trendelenburg.  +-----+---------------+---------+-----------+----------+--------------+ RIGHTCompressibilityPhasicitySpontaneityPropertiesThrombus Aging +-----+---------------+---------+-----------+----------+--------------+ CFV  Full           Yes      Yes                                 +-----+---------------+---------+-----------+----------+--------------+   +---------+---------------+---------+-----------+----------+--------------+ LEFT     CompressibilityPhasicitySpontaneityPropertiesThrombus Aging +---------+---------------+---------+-----------+----------+--------------+ CFV      Full           Yes      Yes                                 +---------+---------------+---------+-----------+----------+--------------+  SFJ      Full                                                        +---------+---------------+---------+-----------+----------+--------------+ FV Prox  Full                                                         +---------+---------------+---------+-----------+----------+--------------+ FV Mid   Full                                                        +---------+---------------+---------+-----------+----------+--------------+ FV DistalFull                                                        +---------+---------------+---------+-----------+----------+--------------+ PFV      Full                                                        +---------+---------------+---------+-----------+----------+--------------+ POP      Full           Yes      Yes                                 +---------+---------------+---------+-----------+----------+--------------+ PTV      Full                                                        +---------+---------------+---------+-----------+----------+--------------+ PERO     Full                                                        +---------+---------------+---------+-----------+----------+--------------+    Summary: RIGHT: - No evidence of common femoral vein obstruction.   LEFT: - There is no evidence of deep vein thrombosis in the lower extremity.  - No cystic structure found in the popliteal fossa.  *See table(s) above for measurements and observations. Electronically signed by Lemar Livings MD on 06/15/2023 at 3:52:51 PM.    Final    DG Chest 2 View Result Date: 06/14/2023 CLINICAL DATA:  Productive cough for several days. Laryngeal carcinoma. EXAM: CHEST - 2 VIEW COMPARISON:  05/21/2023 FINDINGS: The heart size and mediastinal contours are within normal limits. Right-sided Port-A-Cath remains in place. New airspace disease is seen in  the inferior right middle lobe, suspicious for pneumonia. Left lung is clear. No pleural effusion. The visualized skeletal structures are unremarkable. IMPRESSION: New right middle lobe infiltrate, suspicious for pneumonia. Recommend continued chest radiographic follow-up to confirm resolution. Electronically  Signed   By: Danae Orleans M.D.   On: 06/14/2023 09:01     CODE STATUS:  Code Status History     Date Active Date Inactive Code Status Order ID Comments User Context   06/23/2017 1540 06/28/2017 2102 Full Code [id number]  Laveda Abbe, NP Inpatient   06/23/2017 0327 06/23/2017 1502 Full Code [id number]  [facility]   01/13/2017 0858 01/13/2017 1329 Full Code [id number]  Lucretia Roers, MD Inpatient   11/05/2016 0018 11/10/2016 1937 Full Code [id number]  Kerry Hough, PA-C Inpatient       No orders of the defined types were placed in this encounter.    Future Appointments  Date Time Provider Department Center  07/06/2023  9:45 AM Noreene Larsson, RD [facility] None  08/04/2023  1:40 PM [name], MD [facility]  08/06/2023 12:15 PM [facility] FLUSH [facility] None  09/07/2023  2:20 PM Lonie Peak, MD [facility] None  09/17/2023 12:15 PM [facility] FLUSH [facility] None  09/29/2023 11:15 AM [facility] FLUSH [facility] None  09/29/2023 11:45 AM Ra Pfiester, Archie Patten, MD [facility] None  10/29/2023 12:15 PM [facility] FLUSH [facility] None      This document was completed utilizing speech recognition software. Grammatical errors, random word insertions, pronoun errors, and incomplete sentences are an occasional consequence of this system due to software limitations, ambient noise, and hardware issues. Any formal questions or concerns about the content, text or information contained within the body of this dictation should be directly addressed to the provider for clarification.

## 2023-06-25 NOTE — Assessment & Plan Note (Signed)
 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   Discussed his case in our tumor conference on 04/14/2023.  Based on imaging studies, it is classified as cT3 lesion.  cN1.  Hence plan is to proceed with concurrent chemoradiation.  We have discussed about role of cisplatin being a radiosensitizer in the treatment of head and neck cancer.  We have discussed about the curative intent of chemoradiation for this patient.     -He began radiation treatments from 04/14/2023.  Began chemotherapy with cisplatin from 04/19/2023 and completed 7 doses as of 06/08/2023.  Starting from cycle 6, we dose reduce cisplatin to 30 mg/m because of cytopenias.  He completed radiation treatments on 06/08/2023.  He slowly recovering from chemoradiation related side effects.  Steadily making progress.  Labs today reveal normal white count and platelet count.  Hemoglobin slowly improving at 9.8.  Creatinine normal at 1.02.  No electrolyte disturbances.  Going forward we will continue surveillance as per NCCN guidelines.  He has a PET scan scheduled in June 2025.  I will plan to see him after that with results.

## 2023-06-25 NOTE — Assessment & Plan Note (Signed)
 Found during recent hospitalization.  He is currently on Eliquis 5 mg p.o. twice daily.  Plan to continue anticoagulation at least for 3 months followed by reevaluation.

## 2023-06-26 ENCOUNTER — Other Ambulatory Visit: Payer: Self-pay

## 2023-07-04 DIAGNOSIS — E43 Unspecified severe protein-calorie malnutrition: Secondary | ICD-10-CM | POA: Diagnosis not present

## 2023-07-04 DIAGNOSIS — R1312 Dysphagia, oropharyngeal phase: Secondary | ICD-10-CM | POA: Diagnosis not present

## 2023-07-04 DIAGNOSIS — C321 Malignant neoplasm of supraglottis: Secondary | ICD-10-CM | POA: Diagnosis not present

## 2023-07-04 DIAGNOSIS — K219 Gastro-esophageal reflux disease without esophagitis: Secondary | ICD-10-CM | POA: Diagnosis not present

## 2023-07-06 ENCOUNTER — Inpatient Hospital Stay: Attending: Oncology | Admitting: Dietician

## 2023-07-06 NOTE — Progress Notes (Signed)
 Nutrition Follow-up:  Patient with SCC of supraglottis, stage III. Patient receiving concurrent chemotherapy and radiation. Final RT 3/11   Admissions: 3/17-3/20 with HCAP 2/21-2/27 with acute DVT of IJV, CAP, sepsis, syncope, anemia.   Spoke with patient via telephone. He reports doing good. Patient eating a little bit more by mouth (Bologna sandwich, veinna sausage, half a burger from [facility]  Eating ice cream, pudding, eggs). Taste is "light" but improving. Saliva has not been as thick. Patient continues with overnight feeds via pump. He is tolerating Osmolite 1.5 @ 95 ml/hr (8PM-4:30AM). Patient often will give an additional carton around 7AM. He denies nausea, vomiting, diarrhea, constipation.    Medications: reviewed   Labs: 3/28 labs reviewed   Anthropometrics: Wt 132 lb 11.2 oz on 3/28 decreased ~6% in 2.5 weeks - severe (pt reports feeling he has gained wt)  3/11 - 140 lb 12.8 oz   Estimated Energy Needs  Kcals: 2100-2450 Protein: 105-122 Fluid: >2.1 L  NUTRITION DIAGNOSIS: Predicted suboptimal intake - slowly improving    INTERVENTION:  Continue po trials of soft moist foods, encourage high calorie high protein to promote wt gain Continue overnight feeds - Osmolite 1.5 ~95 ml/hr 8PM-4:30AM - suggested adding additional carton vs giving at 7AM Will continue working with pt to wean from tube     MONITORING, EVALUATION, GOAL: wt trends, intake, TF   NEXT VISIT: Friday May 9 after port flush (pt aware)

## 2023-07-12 DIAGNOSIS — C321 Malignant neoplasm of supraglottis: Secondary | ICD-10-CM | POA: Diagnosis not present

## 2023-07-12 DIAGNOSIS — E43 Unspecified severe protein-calorie malnutrition: Secondary | ICD-10-CM | POA: Diagnosis not present

## 2023-07-12 DIAGNOSIS — K219 Gastro-esophageal reflux disease without esophagitis: Secondary | ICD-10-CM | POA: Diagnosis not present

## 2023-07-12 DIAGNOSIS — R1312 Dysphagia, oropharyngeal phase: Secondary | ICD-10-CM | POA: Diagnosis not present

## 2023-08-03 DIAGNOSIS — R1312 Dysphagia, oropharyngeal phase: Secondary | ICD-10-CM | POA: Diagnosis not present

## 2023-08-03 DIAGNOSIS — E43 Unspecified severe protein-calorie malnutrition: Secondary | ICD-10-CM | POA: Diagnosis not present

## 2023-08-03 DIAGNOSIS — K219 Gastro-esophageal reflux disease without esophagitis: Secondary | ICD-10-CM | POA: Diagnosis not present

## 2023-08-03 DIAGNOSIS — C321 Malignant neoplasm of supraglottis: Secondary | ICD-10-CM | POA: Diagnosis not present

## 2023-08-04 ENCOUNTER — Ambulatory Visit (INDEPENDENT_AMBULATORY_CARE_PROVIDER_SITE_OTHER): Payer: Medicare Other | Admitting: Internal Medicine

## 2023-08-04 ENCOUNTER — Encounter: Payer: Self-pay | Admitting: Internal Medicine

## 2023-08-04 VITALS — BP 146/84 | HR 78 | Ht 64.0 in | Wt 132.0 lb

## 2023-08-04 DIAGNOSIS — F411 Generalized anxiety disorder: Secondary | ICD-10-CM | POA: Diagnosis not present

## 2023-08-04 DIAGNOSIS — F333 Major depressive disorder, recurrent, severe with psychotic symptoms: Secondary | ICD-10-CM | POA: Diagnosis not present

## 2023-08-04 DIAGNOSIS — I10 Essential (primary) hypertension: Secondary | ICD-10-CM | POA: Diagnosis not present

## 2023-08-04 DIAGNOSIS — I82C11 Acute embolism and thrombosis of right internal jugular vein: Secondary | ICD-10-CM

## 2023-08-04 DIAGNOSIS — F4001 Agoraphobia with panic disorder: Secondary | ICD-10-CM

## 2023-08-04 DIAGNOSIS — C321 Malignant neoplasm of supraglottis: Secondary | ICD-10-CM

## 2023-08-04 DIAGNOSIS — Z931 Gastrostomy status: Secondary | ICD-10-CM | POA: Diagnosis not present

## 2023-08-04 MED ORDER — FLUOXETINE HCL 20 MG PO CAPS: 20.0000 mg | ORAL_CAPSULE | Freq: Every day | ORAL | 3 refills | Status: DC

## 2023-08-04 MED ORDER — AMLODIPINE BESYLATE 10 MG PO TABS: 10.0000 mg | ORAL_TABLET | Freq: Every day | ORAL | 3 refills | Status: DC

## 2023-08-04 MED ORDER — QUETIAPINE FUMARATE 100 MG PO TABS: 100.0000 mg | ORAL_TABLET | Freq: Every day | ORAL | 3 refills | Status: DC

## 2023-08-04 NOTE — Assessment & Plan Note (Signed)
 Due to chronic dysphagia and malnutrition Followed by nutritionist and GI

## 2023-08-04 NOTE — Assessment & Plan Note (Signed)
 Completed chemoradiation Followed by ENT specialist, Heme/Onc. and radiation oncology

## 2023-08-04 NOTE — Patient Instructions (Addendum)
 Please schedule Medicare Annual Wellness visit.  Please start taking Amlodipine  as prescribed.  Please continue to take medications as prescribed.  Please continue to follow low salt diet and perform moderate exercise/walking at least 150 mins/week.

## 2023-08-04 NOTE — Assessment & Plan Note (Signed)
 On Eliquis  5 mg BID Followed by Heme/Onc.

## 2023-08-04 NOTE — Assessment & Plan Note (Signed)
 Restart Prozac  and Seroquel  Hydroxyzine  as needed for anxiety/panic episode

## 2023-08-04 NOTE — Assessment & Plan Note (Addendum)
 Uncontrolled due to noncompliance Had started Prozac  20 mg once daily, advised to stay compliant Refilled Seroquel  100 mg nightly as he was responding well to it, has lost follow-up with psychiatry Used to be followed by Psychiatry - Dr. Cathyann Cobia, referred to [facility] clinic Check CMP, TSH - wnl

## 2023-08-04 NOTE — Progress Notes (Signed)
 Established Patient Office Visit  Subjective:  Patient ID: [name], male    DOB: 03/28/1971  Age: 53 y.o. MRN: [id number]  CC:  Chief Complaint  Patient presents with   Medical Management of Chronic Issues    Follow up    HPI [name] is a 54 y.o. male with past medical history of HTN, hep C (treated), GERD, MDD, GAD and substance abuse who presents for f/u of his chronic medical conditions.  HTN: His blood pressure is elevated today.  He has been taking metoprolol  12.5 mg BID. Azor  was discontinued during recent hospitalization due to hypotension. Denies any chest pain, dyspnea or palpitations.  Chronic hepatitis C: Chart review suggests that he had hepatitis C in 2018, was followed by GI and was given Harvoni .  He has not followed up since then.  He used to drink alcohol daily and has cocaine abuse history, but has quit them for about 2 years.  Denies any chronic nausea or vomiting.  MDD and GAD: He used to see psychiatry in [name] psychiatry.  He was responding well to Prozac  20 mg and Seroquel  100 mg qHS.  He experiences worsening of anxiety recently since running out of medicines.  He was evaluated by Dr. Cathyann Sandoval, and recommended to continue Prozac  for now. Patient also reports forgetfulness.  His MoCA was 25/30 today. Denies any episode of wandering.  Denies any SI or HI currently.  He has been diagnosed with squamous cell carcinoma of epiglottis. He has completed chemoradiation treatment. Denies dyspnea or wheezing currently.  He had dysphagia and poor p.o. intake after radiation treatment.  He had PEG tube placement for proper nutrition, and is followed by GI and nutritionist currently.    Past Medical History:  Diagnosis Date   Anxiety    Bipolar 1 disorder (HCC)    EtOH dependence (HCC)    QUIT   GERD without esophagitis    Hepatitis C carrier (HCC)    Hypercholesteremia    Hypertension    laryngeal ca 02/2023   Otitis media 02/04/2023    Polysubstance dependence (HCC)    COCAINE- quit   RSV infection 05/03/2023   Sepsis due to pneumonia (HCC) 05/22/2023   Skin infection at gastrostomy tube site [facility]) 05/03/2023   Stroke [facility])    has some ST memory deficits from stroke.   Substance induced mood disorder (HCC) 11/05/2016   Syncope 05/21/2023    Past Surgical History:  Procedure Laterality Date   ANEURYSM COILING     AGE 29 x2   APPENDECTOMY     BIOPSY  01/05/2017   Procedure: BIOPSY;  Surgeon: Alyce Jubilee, MD;  [facility];  Service: Endoscopy;;  Duodenal and Gastric   COLONOSCOPY WITH PROPOFOL  N/A 01/05/2017   Procedure: COLONOSCOPY WITH PROPOFOL ;  Surgeon: Alyce Jubilee, MD;  Location: [facility];  Service: Endoscopy;  Laterality: N/A;  10:00am   DIRECT LARYNGOSCOPY Left 03/16/2023   Procedure: Direct laryngoscopy with biopsy of supraglottic mass;  Surgeon: Reynold Caves, MD;  Location: [facility];  Service: ENT;  Laterality: Left;   ESOPHAGOGASTRODUODENOSCOPY (EGD) WITH PROPOFOL  N/A 01/05/2017   Procedure: ESOPHAGOGASTRODUODENOSCOPY (EGD) WITH PROPOFOL ;  Surgeon: Alyce Jubilee, MD;  [facility];  Service: Endoscopy;  Laterality: N/A;   IR GASTROSTOMY TUBE MOD SED  04/28/2023   IR IMAGING GUIDED PORT INSERTION  04/28/2023   IR PATIENT EVAL TECH 0-60 MINS  05/03/2023   LAPAROSCOPIC APPENDECTOMY  MASS EXCISION Right 01/13/2017   Procedure: EXCISION LIPOMA RIGHT ARM;  Surgeon: Awilda Bogus, MD;  Location: [facility];  Service: General;  Laterality: Right;    Family History  Problem Relation Age of Onset   Cancer Mother        Lung   Stomach cancer Mother    Cancer Sister        Bone cancer   Bone cancer Sister 50   Cancer Brother        3 brothers with prostate cancer   Colon cancer Brother 29   Colon polyps Neg Hx     Social History   Socioeconomic History   Marital status: Married    Spouse name: Not on file   Number of children: Not on file   Years of  education: Not on file   Highest education level: Not on file  Occupational History   Not on file  Tobacco Use   Smoking status: Former    Current packs/day: 0.25    Average packs/day: 0.3 packs/day for 25.0 years (6.3 ttl pk-yrs)    Types: Cigarettes   Smokeless tobacco: Never   Tobacco comments:    More than 2 packs/day as of 03/18/2023 but quit around the new year  Vaping Use   Vaping status: Every Day   Substances: Nicotine   Substance and Sexual Activity   Alcohol use: Yes    Alcohol/week: 1.0 standard drink of alcohol    Types: 1 Cans of beer per week    Comment: 1 beer at a time when consuming but unclear how frequent.   Drug use: Not Currently    Types: "Crack" cocaine, Cocaine, Marijuana    Comment: last positive in 2020.  Ssta used last 2 months ago.   Sexual activity: Yes    Birth control/protection: Condom  Other Topics Concern   Not on file  Social History Narrative   NOW ON SHORT TERM DISABILITY. 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   MARRIED-3 WITH WIFE AND 3 PREVIOUS.   Social Drivers of Corporate investment banker Strain: Not on file  Food Insecurity: No Food Insecurity (06/14/2023)   Hunger Vital Sign    Worried About Running Out of Food in the Last Year: Never true    Ran Out of Food in the Last Year: Never true  Transportation Needs: No Transportation Needs (06/14/2023)   PRAPARE - Administrator, Civil Service (Medical): No    Lack of Transportation (Non-Medical): No  Physical Activity: Not on file  Stress: Not on file  Social Connections: Socially Integrated (06/14/2023)   Social Connection and Isolation Panel [NHANES]    Frequency of Communication with Friends and Family: More than three times a week    Frequency of Social Gatherings with Friends and Family: Once a week    Attends Religious Services: More than 4 times per year    Active Member of [employer] or Organizations: Yes    Attends Banker Meetings: 1 to 4  times per year    Marital Status: Married  Catering manager Violence: Not At Risk (06/14/2023)   Humiliation, Afraid, Rape, and Kick questionnaire    Fear of Current or Ex-Partner: No    Emotionally Abused: No    Physically Abused: No    Sexually Abused: No    ROS Review of Systems  Constitutional:  Negative for chills and fever.  HENT:  Positive for trouble swallowing and voice change. Negative for  congestion and sore throat.   Eyes:  Negative for pain and discharge.  Respiratory:  Negative for cough and shortness of breath.   Cardiovascular:  Negative for chest pain and palpitations.  Gastrointestinal:  Negative for diarrhea, nausea and vomiting.  Endocrine: Negative for polydipsia and polyuria.  Genitourinary:  Negative for dysuria and hematuria.  Musculoskeletal:  Negative for neck pain and neck stiffness.  Skin:  Negative for rash.  Neurological:  Negative for dizziness, weakness, numbness and headaches.  Psychiatric/Behavioral:  Positive for dysphoric mood and sleep disturbance. Negative for agitation and behavioral problems. The patient is nervous/anxious.     Objective:   Today's Vitals: BP (!) 146/84 (BP Location: Left Arm)   Pulse 78   Ht 5\' 4"  (1.626 m)   Wt 132 lb (59.9 kg)   SpO2 98%   BMI 22.66 kg/m   Physical Exam Vitals reviewed.  Constitutional:      General: He is not in acute distress.    Appearance: He is not diaphoretic.  HENT:     Head: Normocephalic and atraumatic.     Nose: Nose normal.     Mouth/Throat:     Mouth: Mucous membranes are moist.  Eyes:     General: No scleral icterus.    Extraocular Movements: Extraocular movements intact.  Cardiovascular:     Rate and Rhythm: Normal rate and regular rhythm.     Heart sounds: Normal heart sounds. No murmur heard. Pulmonary:     Breath sounds: Normal breath sounds. No wheezing or rales.  Abdominal:     Palpations: Abdomen is soft.     Tenderness: There is no abdominal tenderness.   Musculoskeletal:     Cervical back: Neck supple. No tenderness.     Right lower leg: No edema.     Left lower leg: No edema.  Skin:    General: Skin is warm.     Findings: No rash.  Neurological:     General: No focal deficit present.     Mental Status: He is alert and oriented to person, place, and time.  Psychiatric:        Mood and Affect: Mood is depressed.        Behavior: Behavior is cooperative.     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 and radiation oncology        Other   Major depressive disorder, recurrent episode, severe, with psychosis (HCC) (Chronic)   Uncontrolled due to noncompliance Had started Prozac  20 mg once daily, advised to stay compliant  Refilled Seroquel  100 mg nightly as he was responding well to it, has lost follow-up with psychiatry Used to be followed by Psychiatry - Dr. Cathyann Sandoval, referred to [facility] clinic Check CMP, TSH - wnl      Relevant Medications   FLUoxetine  (PROZAC ) 20 MG capsule    QUEtiapine  (SEROQUEL ) 100 MG tablet   Other Relevant Orders   Ambulatory referral to Psychiatry   Panic disorder with agoraphobia (Chronic)   Restart Prozac  and Seroquel  Hydroxyzine  as needed for anxiety/panic episode      Relevant Medications   FLUoxetine  (PROZAC ) 20 MG capsule   QUEtiapine  (SEROQUEL ) 100 MG tablet   Other Relevant Orders   Ambulatory referral to Psychiatry   GAD (generalized anxiety disorder)   Relevant Medications   FLUoxetine  (PROZAC ) 20 MG capsule   QUEtiapine  (SEROQUEL ) 100 MG tablet   PEG (percutaneous endoscopic gastrostomy) status (HCC)   Due to chronic dysphagia and malnutrition Followed by nutritionist and GI         Outpatient Encounter Medications as of 08/04/2023  Medication Sig   acetaminophen  (TYLENOL ) 500 MG tablet Take 1,000 mg by mouth at bedtime as needed for mild pain (pain score 1-3) or moderate pain (pain score 4-6).   amLODipine  (NORVASC ) 10 MG tablet Take 1 tablet (10 mg total) by mouth daily.   apixaban  (ELIQUIS ) 5 MG TABS tablet Take 5 mg by mouth in the morning and at bedtime   dexamethasone  (DECADRON ) 4 MG tablet Take 2 tablets (8 mg) by mouth daily x 3 days starting the day after cisplatin  chemotherapy. Take with food.   famotidine  (PEPCID ) 40 MG tablet Take 1 tablet (40 mg total) by mouth at bedtime.   guaiFENesin -dextromethorphan  (ROBITUSSIN DM) 100-10 MG/5ML syrup Place 15 mLs into feeding tube every 6 (six) hours. (Patient taking differently: Take 15 mLs by mouth every 6 (six) hours.)   hydrOXYzine  (ATARAX ) 25 MG tablet Take 1 tablet (25 mg total) by mouth 3 (three) times daily as needed for anxiety. (Patient taking differently: Take 25 mg by mouth at bedtime.)   lidocaine  (XYLOCAINE ) 2 % solution Patient: Mix 1part 2% viscous lidocaine , 1part H20. Swallow 10mL of diluted mixture, before meals and at bedtime, up to QID   lidocaine -prilocaine  (EMLA ) cream Apply to affected area once (Patient taking differently: Apply 1  Application topically as needed (port).)   metoprolol  tartrate (LOPRESSOR ) 25 MG tablet Take 0.5 tablets (12.5 mg total) by mouth 2 (two) times daily.   Nutritional Supplements (NUTREN 1.5) LIQD 95ml/hr for 18 hours/day (start at 3pm and stop feeding at 9am).  Flush of water  at 3pm, 7pm, 11pm and 9am.   ondansetron  (ZOFRAN ) 8 MG tablet Take 1 tablet (8 mg total) by mouth every 8 (eight) hours as needed for nausea or vomiting. Start on the third day after cisplatin .   oxyCODONE  10 MG TABS Take 1 tablet (10 mg total) by mouth every 6 (six) hours as needed for severe pain (pain score 7-10).   pantoprazole  (PROTONIX ) 40 MG tablet Take 1 tablet (40 mg total) by mouth 2 (two) times daily.   prochlorperazine  (COMPAZINE ) 10 MG tablet TAKE 1 TABLET(10 MG) BY MOUTH EVERY 6 HOURS AS NEEDED FOR NAUSEA OR VOMITING   Protein (FEEDING SUPPLEMENT, PROSOURCE TF20,) liquid Place 60 mLs into feeding tube daily.   [DISCONTINUED] FLUoxetine  (PROZAC ) 20 MG capsule Take 1 capsule (20 mg total) by mouth daily.   [DISCONTINUED] QUEtiapine  (SEROQUEL ) 100 MG tablet Take 1 tablet (100 mg total) by mouth at bedtime.  FLUoxetine  (PROZAC ) 20 MG capsule Take 1 capsule (20 mg total) by mouth daily.   QUEtiapine  (SEROQUEL ) 100 MG tablet Take 1 tablet (100 mg total) by mouth at bedtime.   sucralfate  (CARAFATE ) 1 GM/10ML suspension Place 10 mLs (1 g total) into feeding tube in the morning, at noon, and at bedtime for 20 days. (Patient not taking: Reported on 06/14/2023)   No facility-administered encounter medications on file as of 08/04/2023.    Follow-up: Return in about 3 months (around 11/04/2023) for HTN.   Meldon Sport, MD

## 2023-08-04 NOTE — Assessment & Plan Note (Addendum)
 BP Readings from Last 1 Encounters:  08/04/23 (!) 146/84   Was well-controlled with Azor  10-20 mg QD and metoprolol  25 mg once daily, Azor  was discontinued during recent hospitalization Uncontrolled with metoprolol  12.5 mg BID currently Added amlodipine  10 mg QD for now Counseled for compliance with the medications Advised DASH diet and moderate exercise/walking, at least 150 mins/week

## 2023-08-05 ENCOUNTER — Other Ambulatory Visit: Payer: Self-pay

## 2023-08-06 ENCOUNTER — Inpatient Hospital Stay: Attending: Radiation Oncology

## 2023-08-06 ENCOUNTER — Other Ambulatory Visit: Payer: Self-pay | Admitting: Oncology

## 2023-08-06 ENCOUNTER — Inpatient Hospital Stay: Admitting: Dietician

## 2023-08-06 DIAGNOSIS — Z95828 Presence of other vascular implants and grafts: Secondary | ICD-10-CM

## 2023-08-06 DIAGNOSIS — C321 Malignant neoplasm of supraglottis: Secondary | ICD-10-CM | POA: Insufficient documentation

## 2023-08-06 DIAGNOSIS — Z452 Encounter for adjustment and management of vascular access device: Secondary | ICD-10-CM | POA: Insufficient documentation

## 2023-08-06 MED ORDER — SODIUM CHLORIDE 0.9% FLUSH
10.0000 mL | Freq: Once | INTRAVENOUS | Status: AC
Start: 2023-08-06 — End: 2023-08-06
  Administered 2023-08-06: 10 mL

## 2023-08-06 MED ORDER — HEPARIN SOD (PORK) LOCK FLUSH 100 UNIT/ML IV SOLN
250.0000 [IU] | Freq: Once | INTRAVENOUS | Status: AC
  Administered 2023-08-06: 250 [IU]

## 2023-08-06 MED ORDER — LIDOCAINE-PRILOCAINE 2.5-2.5 % EX CREA: 1.0000 | TOPICAL_CREAM | CUTANEOUS | 1 refills | Status: DC | PRN

## 2023-08-06 NOTE — Progress Notes (Signed)
 Nutrition Follow-up:  Patient with SCC of supraglottis, stage III. Patient receiving concurrent chemotherapy and radiation. Final RT 3/11   Admissions: 3/17-3/20 with HCAP 2/21-2/27 with acute DVT of IJV, CAP, sepsis, syncope, anemia  Met with patient in waiting area. He reports doing okay. Tolerating regular textures, however taste continues to be a challenge. Yesterday had egg/cheese biscuit for breakfast. Recalls half t-bone steak and salad with [place] dressing. He has tried ranch but this does not taste good. Snacks on canteloupe and watermelon. Patient drinking a lot of water . Patient is relying on overnight feeds. Tolerating 6 cartons Osmolite 1.5 @ 57ml/hr. He denies nausea, vomiting, diarrhea, constipation. Patient reports walking ~2 miles most every day.    Medications: reviewed   Labs: new labs for review   Anthropometrics: Pt reports stable 134 lb on home scale  5/7 - 132 lb  4/8 - 132 lb 11.2 oz   Estimated Energy Needs  Kcals: 2100-2450 Protein: 105-122 Fluid: >2.1 L  NUTRITION DIAGNOSIS: Predicted suboptimal intake ongoing - addressing with TF   INTERVENTION:  Continue po trials, encourage high calorie high protein foods Encourage baking soda salt water  rinses prior to po Continue overnight feeds Will continue working with pt to wean from tube    MONITORING, EVALUATION, GOAL: wt trends, intake, TF   NEXT VISIT: Tuesday June 10

## 2023-08-06 NOTE — Progress Notes (Signed)
 Patient here for port flush.  Request Rx Lidocaine  cream called to [facility], [place], [place].  Secure chatted Dr. Wende Halo and Eileen/RN.  Dr. Wende Halo sent Rx to pharmacy per secure chat

## 2023-08-12 ENCOUNTER — Telehealth: Payer: Self-pay | Admitting: *Deleted

## 2023-08-12 NOTE — Telephone Encounter (Signed)
 CALLED PATIENT TO INFORM OF PET SCAN FOR 09-03-23- ARRIVAL TIME- 9 AM @ [facility], WATER  ONLY-6 HRS. PRIOR TO SCAN, PATIENT TO RECEIVE RESULTS FROM DR. SQUIRE ON 09/07/23 @ 2:20 PM, SPOKE WITH PATIENT AND HE IS AWARE OF THESE APPTS. AND THE INSTRUCTIONS

## 2023-09-03 ENCOUNTER — Encounter (HOSPITAL_COMMUNITY)
Admission: RE | Admit: 2023-09-03 | Discharge: 2023-09-03 | Disposition: A | Discharge status: Home / Self Care | Source: Ambulatory Visit | Attending: Radiology | Admitting: Radiology

## 2023-09-03 DIAGNOSIS — C321 Malignant neoplasm of supraglottis: Secondary | ICD-10-CM | POA: Diagnosis not present

## 2023-09-03 DIAGNOSIS — R1312 Dysphagia, oropharyngeal phase: Secondary | ICD-10-CM | POA: Diagnosis not present

## 2023-09-03 DIAGNOSIS — C760 Malignant neoplasm of head, face and neck: Secondary | ICD-10-CM | POA: Diagnosis not present

## 2023-09-03 DIAGNOSIS — K219 Gastro-esophageal reflux disease without esophagitis: Secondary | ICD-10-CM | POA: Diagnosis not present

## 2023-09-03 DIAGNOSIS — E43 Unspecified severe protein-calorie malnutrition: Secondary | ICD-10-CM | POA: Diagnosis not present

## 2023-09-03 LAB — GLUCOSE, CAPILLARY: Glucose-Capillary: 103 mg/dL — ABNORMAL HIGH (ref 70–99)

## 2023-09-03 MED ORDER — FLUDEOXYGLUCOSE F - 18 (FDG) INJECTION
6.5500 | Freq: Once | INTRAVENOUS | Status: AC
Start: 2023-09-03 — End: 2023-09-03
  Administered 2023-09-03: 6.55 via INTRAVENOUS

## 2023-09-03 NOTE — Progress Notes (Signed)
 [name] presents today in the clinic for a follow up and is here to review PET scan results. 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 Using a feeding tube?: Yes, patient has feeding tube. Patient is eating a lot of fruits and vegetables, eats minimal meat. Weight changes, if any: Patient's weight has stayed stable. Swallowing issues, if any: No issues swallowing Smoking or chewing tobacco? Patient does smoke, is trying to quit Using fluoride toothpaste daily? Yes Last ENT visit was on: December 2024 Other notable issues, if any: Patient is anxious about test results.  Wt Readings from Last 3 Encounters:  09/07/23 128 lb 2 oz (58.1 kg)  08/04/23 132 lb (59.9 kg)  06/25/23 132 lb 11.2 oz (60.2 kg)   BP (!) 177/112 (BP Location: Left Arm, Patient Position: Sitting)   Pulse 75   Temp 97.8 F (36.6 C) (Temporal)   Resp 18   Ht 5\' 4"  (1.626 m)   Wt 128 lb 2 oz (58.1 kg)   SpO2 100%   BMI 21.99 kg/m

## 2023-09-07 ENCOUNTER — Ambulatory Visit
Admission: RE | Admit: 2023-09-07 | Discharge: 2023-09-07 | Disposition: A | Discharge status: Home / Self Care | Payer: Self-pay | Source: Ambulatory Visit | Attending: Radiation Oncology | Admitting: Radiation Oncology

## 2023-09-07 ENCOUNTER — Inpatient Hospital Stay: Attending: Radiation Oncology | Admitting: Dietician

## 2023-09-07 VITALS — BP 177/112 | HR 75 | Temp 97.8°F | Resp 18 | Ht 64.0 in | Wt 128.1 lb

## 2023-09-07 DIAGNOSIS — Z79899 Other long term (current) drug therapy: Secondary | ICD-10-CM | POA: Insufficient documentation

## 2023-09-07 DIAGNOSIS — C321 Malignant neoplasm of supraglottis: Secondary | ICD-10-CM | POA: Diagnosis not present

## 2023-09-07 DIAGNOSIS — Z923 Personal history of irradiation: Secondary | ICD-10-CM | POA: Insufficient documentation

## 2023-09-07 DIAGNOSIS — Z7952 Long term (current) use of systemic steroids: Secondary | ICD-10-CM | POA: Diagnosis not present

## 2023-09-07 DIAGNOSIS — Z9221 Personal history of antineoplastic chemotherapy: Secondary | ICD-10-CM | POA: Insufficient documentation

## 2023-09-07 DIAGNOSIS — Z452 Encounter for adjustment and management of vascular access device: Secondary | ICD-10-CM | POA: Insufficient documentation

## 2023-09-07 DIAGNOSIS — F17210 Nicotine dependence, cigarettes, uncomplicated: Secondary | ICD-10-CM | POA: Insufficient documentation

## 2023-09-07 DIAGNOSIS — Z7901 Long term (current) use of anticoagulants: Secondary | ICD-10-CM | POA: Diagnosis not present

## 2023-09-07 MED ORDER — LIDOCAINE-PRILOCAINE 2.5-2.5 % EX CREA: 1.0000 | TOPICAL_CREAM | CUTANEOUS | 1 refills | Status: DC | PRN

## 2023-09-07 NOTE — Progress Notes (Signed)
 Nutrition Follow-up:  Patient with SCC of supraglottis, stage III. Patient receiving concurrent chemotherapy and radiation. Final RT 3/11   Admissions: 3/17-3/20 with [facility] 2/21-2/27 with acute DVT of IJV, CAP, sepsis, syncope, anemia  Met with patient and wife in infusion. He reports eating and drinking by mouth. Says he is a vegan now as he is unable to eat meat due to no taste. Patient can taste the first couple of bites, but "it goes away." Eats mostly fruits and vegetables. Does well with cereal/whole milk, eggs, beans, fish. He is drinking 2 Ensure Complete as well as lemon tea and Gatorade. He is not drinking much water , but will work on this. Patient has not used tube "in weeks." He continues daily FWF. Patient reports feeling nauseous with episodes of vomiting after hooking up to pump. He denies constipation or diarrhea.    Medications: reviewed   Labs: no new labs  Anthropometrics: Wt 128.2 lb today   5/7 - 132 lb  3/28 - 132 lb 11.2 oz    Estimated Energy Needs  Kcals: 2100-2450 Protein: 105-122 Fluid: >2.1 L  NUTRITION DIAGNOSIS: Predicted sub-optimal intake improved   INTERVENTION:  Discussed plant-based proteins, recommend protein foods at every meal Suggested soft moist meats, adding gravy/sauce for ease of intake Pt agreeable to oral intake 3 times daily. Glass of whole milk with meals Recommend increasing Ensure to promote wt gain - 3/day (samples + coupons) Pt will work to increase intake of water  Given weights have been stable with oral intake, recommend d/c of feeding tube     MONITORING, EVALUATION, GOAL: wt trends, intake, TF   NEXT VISIT: To be scheduled as needed

## 2023-09-07 NOTE — Progress Notes (Addendum)
 Radiation Oncology         [phone] ________________________________  Name: [name] MRN: [id number]  Date: 09/07/2023  DOB: January 20, 1971  Follow-Up Visit Note  CC: Meldon Sport, MD  Meldon Sport, MD  Diagnosis and Prior Radiotherapy:       ICD-10-CM   1. Malignant neoplasm of supraglottis (HCC)  C32.1 lidocaine -prilocaine  (EMLA ) cream     ==========DELIVERED PLANS==========  First Treatment Date: 2023-04-14 Last Treatment Date: 2023-06-08   Plan Name: HN Site: Larynx Technique: IMRT Mode: Photon Dose Per Fraction: 2 Gy Prescribed Dose (Delivered / Prescribed): 70 Gy / 70 Gy Prescribed Fxs (Delivered / Prescribed): 35 / 35  Cancer Staging  Malignant neoplasm of supraglottis (HCC) Staging form: Larynx - Supraglottis, AJCC 8th Edition - Clinical stage from 03/26/2023: Stage III (cT1, cN1, cM0) - Unsigned Stage prefix: Initial diagnosis  Stage III (cT1, cN1, 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 He completed treatment on 06/08/2023.   Narrative:  The patient returns today for routine follow-up and to review the results of his most recent PET scan.  PET on 09/03/2023 demonstrated no evidence of recurrent laryngeal carcinoma; metastatic adenopathy in the neck; or distant metastatic disease.  Pain issues, if any: Patient is having some throat soreness and feels like his throat is raw. Using a feeding tube?: Yes, patient has feeding tube. Patient is eating a lot of fruits and vegetables, eats minimal meat. Weight changes, if any: Patient's weight has stayed stable. Swallowing issues, if any: No issues swallowing Smoking or chewing tobacco? Patient does smoke, is trying to quit Using fluoride toothpaste daily? Yes Last ENT visit was on: December 2024 Other notable issues,  if any: Patient is anxious about test results.  ALLERGIES:  has no known allergies.  Meds: Current Outpatient Medications  Medication Sig Dispense Refill   acetaminophen  (TYLENOL ) 500 MG tablet Take 1,000 mg by mouth at bedtime as needed for mild pain (pain score 1-3) or moderate pain (pain score 4-6).     amLODipine  (NORVASC ) 10 MG tablet Take 1 tablet (10 mg total) by mouth daily. 30 tablet 3   apixaban  (ELIQUIS ) 5 MG TABS tablet Take 5 mg by mouth in the morning and at bedtime 388 tablet 0   dexamethasone  (DECADRON ) 4 MG tablet Take 2 tablets (8 mg) by mouth daily x 3 days starting the day after cisplatin  chemotherapy. Take with food. 30 tablet 1   famotidine  (PEPCID ) 40 MG tablet Take 1 tablet (40 mg total) by mouth at bedtime.     FLUoxetine  (PROZAC ) 20 MG capsule Take 1 capsule (20 mg total) by mouth daily. 30 capsule 3   guaiFENesin -dextromethorphan  (ROBITUSSIN DM) 100-10 MG/5ML syrup Place 15 mLs into feeding tube every 6 (six) hours. (Patient taking differently: Take 15 mLs by mouth every 6 (six) hours.) 118 mL 0   hydrOXYzine  (ATARAX ) 25 MG tablet Take 1 tablet (25 mg total) by mouth 3 (three) times daily as needed for anxiety. (Patient taking differently: Take 25 mg by mouth at bedtime.) 90 tablet 1   lidocaine  (XYLOCAINE ) 2 % solution Patient: Mix 1part 2% viscous lidocaine , 1part H20. Swallow 10mL of diluted mixture, before meals and at bedtime, up to QID 200 mL 3   lidocaine -prilocaine  (EMLA ) cream Apply 1 Application topically as needed (port). 5 g 1   metoprolol  tartrate (LOPRESSOR )  25 MG tablet Take 0.5 tablets (12.5 mg total) by mouth 2 (two) times daily. 60 tablet 1   Nutritional Supplements (NUTREN 1.5) LIQD 95ml/hr for 18 hours/day (start at 3pm and stop feeding at 9am).  Flush of water  at 3pm, 7pm, 11pm and 9am.     ondansetron  (ZOFRAN ) 8 MG tablet Take 1 tablet (8 mg total) by mouth every 8 (eight) hours as needed for nausea or vomiting. Start on the third day  after cisplatin . 30 tablet 1   oxyCODONE  10 MG TABS Take 1 tablet (10 mg total) by mouth every 6 (six) hours as needed for severe pain (pain score 7-10). 60 tablet 0   pantoprazole  (PROTONIX ) 40 MG tablet Take 1 tablet (40 mg total) by mouth 2 (two) times daily. 60 tablet 2   prochlorperazine  (COMPAZINE ) 10 MG tablet TAKE 1 TABLET(10 MG) BY MOUTH EVERY 6 HOURS AS NEEDED FOR NAUSEA OR VOMITING 30 tablet 1   Protein (FEEDING SUPPLEMENT, PROSOURCE TF20,) liquid Place 60 mLs into feeding tube daily. 1800 mL 0   QUEtiapine  (SEROQUEL ) 100 MG tablet Take 1 tablet (100 mg total) by mouth at bedtime. 30 tablet 3   sucralfate  (CARAFATE ) 1 GM/10ML suspension Place 10 mLs (1 g total) into feeding tube in the morning, at noon, and at bedtime for 20 days. (Patient not taking: Reported on 06/14/2023) 600 mL 0   No current facility-administered medications for this encounter.    Physical Findings:  The patient is in no acute distress. Patient is alert and oriented. Wt Readings from Last 3 Encounters:  09/07/23 128 lb 2 oz (58.1 kg)  08/04/23 132 lb (59.9 kg)  06/25/23 132 lb 11.2 oz (60.2 kg)    height is 5\' 4"  (1.626 m) and weight is 128 lb 2 oz (58.1 kg). His temporal temperature is 97.8 F (36.6 C). His blood pressure is 177/112 (abnormal) and his pulse is 75. His respiration is 18 and oxygen saturation is 100%. .  General: Alert and oriented, in no acute distress HEENT: Head is normocephalic. Extraocular movements are intact. Oropharynx is notable for mucositis throughout the oral mucosa. No concerning lesions.  Neck: No palpable adenopathy  Skin: Skin is notable for satisfactory healing within the treatment field.  Heart: Regular in rate and rhythm with no murmurs, rubs, or gallops. Chest: Clear to auscultation bilaterally, with no rhonchi, wheezes, or rales. Abdomen: Soft, nontender, nondistended, with no rigidity or guarding. Extremities: No cyanosis or edema. Lymphatics: see Neck  Exam Psychiatric: Judgment and insight are intact. Affect is appropriate.  PROCEDURE NOTE: After obtaining consent and spraying nasal cavity with topical lidocaine  and oxymetazoline , the flexible endoscope was coated with lidocaine  gel and introduced and passed through the nasal cavity.  The nasopharynx, oropharynx, hypopharynx, and larynx were then examined. No lesions appreciated in the mucosal axis, excessive saliva visualized.  The true cords were symmetrically mobile.  The patient tolerated the procedure well.   Lab Findings: Lab Results  Component Value Date   WBC 6.1 06/25/2023   HGB 9.8 (L) 06/25/2023   HCT 29.7 (L) 06/25/2023   MCV 90.3 06/25/2023   PLT 349 06/25/2023    Lab Results  Component Value Date   TSH 1.058 04/16/2023    Radiographic Findings: NM PET Image Restag (PS) Skull Base To Thigh Result Date: 09/07/2023 CLINICAL DATA:  Subsequent treatment strategy for head neck carcinoma. Pharyngeal carcinoma. EXAM: NUCLEAR MEDICINE PET SKULL BASE TO THIGH TECHNIQUE: 6.6 mCi F-18 FDG was injected intravenously. Full-ring PET imaging  was performed from the skull base to thigh after the radiotracer. CT data was obtained and used for attenuation correction and anatomic localization. Fasting blood glucose: 103 mg/dl COMPARISON:  None Available. FINDINGS: NECK: No abnormal metabolic activity in the oropharynx or hypopharynx. No abnormal activity in the larynx. No hypermetabolic cervical lymph nodes or supraclavicular nodes. Incidental CT findings: None. CHEST: No hypermetabolic mediastinal or hilar nodes. No suspicious pulmonary nodules on the CT scan. Incidental CT findings: None. ABDOMEN/PELVIS: No abnormal hypermetabolic activity within the liver, pancreas, adrenal glands, or spleen. No hypermetabolic lymph nodes in the abdomen or pelvis. Incidental CT findings: Peg tube in stomach. SKELETON: No focal hypermetabolic activity to suggest skeletal metastasis. Incidental CT findings: None.  IMPRESSION: 1. No evidence of recurrent laryngeal carcinoma. 2. No evidence of metastatic adenopathy in the neck. 3. No evidence distant metastatic disease. Electronically Signed   By: Deboraha Fallow M.D.   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 PET and laryngoscope demonstrate a positive response to treatment.   2) Nutritional Status: Stable, patient is down 10 lbs since beginning treatment. Patient is able to tolerate all foods, but his lack of taste has caused a low appetite. Encouraged him to continue to push oral intake to allow for adequate healing.  Wt Readings from Last 3 Encounters:  09/07/23 128 lb 2 oz (58.1 kg)  08/04/23 132 lb (59.9 kg)  06/25/23 132 lb 11.2 oz (60.2 kg)  PEG tube: In-place. Patient has not used this for a couple weeks.   3) Risk Factors: The patient has been educated about risk factors including alcohol and tobacco abuse; they understand that avoidance of alcohol and tobacco is important to prevent recurrences as well as other cancers. Patient is smoking approximately 1/2 ppd. He is interested in quitting, but unwilling to pick a quit date at this time. Reiterated nicotine  patch use.   4) Swallowing: Able to tolerate a wide variety of foods.   5) Dental: Encouraged to continue regular followup with dentistry, and dental hygiene including fluoride rinses. He was given a handout of our community dentists today.   6) Thyroid  function: Checking annually under the care of Dr. Randye Buttner.  Lab Results  Component Value Date   TSH 1.058 04/16/2023    8) Scheduled to see Dr. Randye Buttner on 09/29/2023 and ENT in 3 months. We will see the patient in 11 months with a CT of the neck and chest with contrast beforehand to review. The  patient was encouraged to call with any issues or questions before then.   On date of service, in total, we spent 30 minutes on this encounter.  Patient was seen in person. _____________________________________    Julio Ohm, PA-C   Colie Dawes, MD    [facility]  Radiation Oncology Direct Dial: [phone]  Fax: [phone] [email or web address]

## 2023-09-08 ENCOUNTER — Other Ambulatory Visit: Payer: Self-pay

## 2023-09-08 DIAGNOSIS — C321 Malignant neoplasm of supraglottis: Secondary | ICD-10-CM

## 2023-09-08 NOTE — Progress Notes (Signed)
 Oncology Nurse Navigator Documentation   I met with Mr. Lagace and his wife during his follow up appointment with Dr. Lurena Sally and Juda Norse to receive his post treatment PET results. 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 I provided him with a list of local dentists who take his insurance to get set up with. He will have repeat imaging in May 2026 and see Ellie/Dr. Lurena Sally for results. I have sent a message to Dr. Pearson Bounds office for him to be seen for post treatment follow up in 3 months and he will follow up with Dr. Randye Buttner on 09/29/23. He and his wife have my contact number and know to call me if I can help them in anyway.   Lynetta Saran RN, BSN, OCN Head & Neck Oncology Nurse Navigator [facility] Phone # [phone]  Fax # [phone]

## 2023-09-13 ENCOUNTER — Encounter (INDEPENDENT_AMBULATORY_CARE_PROVIDER_SITE_OTHER): Payer: Self-pay

## 2023-09-13 ENCOUNTER — Telehealth (INDEPENDENT_AMBULATORY_CARE_PROVIDER_SITE_OTHER): Payer: Self-pay | Admitting: Otolaryngology

## 2023-09-13 NOTE — Telephone Encounter (Signed)
 Received Staff Msg from [facility], RN in Oncology to schedule patient for a Follow Up appt with Dr. Darlin Ehrlich.  Patient is a Head & Neck patient that has completed treatment and had good post treatment scan.  Called patient to schedule appt and was Unable to LVM because  Mailbox full.  Sent MyChart msg

## 2023-09-17 ENCOUNTER — Inpatient Hospital Stay

## 2023-09-17 DIAGNOSIS — Z452 Encounter for adjustment and management of vascular access device: Secondary | ICD-10-CM | POA: Diagnosis not present

## 2023-09-17 DIAGNOSIS — Z7952 Long term (current) use of systemic steroids: Secondary | ICD-10-CM | POA: Diagnosis not present

## 2023-09-17 DIAGNOSIS — Z9221 Personal history of antineoplastic chemotherapy: Secondary | ICD-10-CM | POA: Diagnosis not present

## 2023-09-17 DIAGNOSIS — Z95828 Presence of other vascular implants and grafts: Secondary | ICD-10-CM

## 2023-09-17 DIAGNOSIS — C321 Malignant neoplasm of supraglottis: Secondary | ICD-10-CM | POA: Diagnosis not present

## 2023-09-17 DIAGNOSIS — Z79899 Other long term (current) drug therapy: Secondary | ICD-10-CM | POA: Diagnosis not present

## 2023-09-17 DIAGNOSIS — Z923 Personal history of irradiation: Secondary | ICD-10-CM | POA: Diagnosis not present

## 2023-09-17 DIAGNOSIS — Z7901 Long term (current) use of anticoagulants: Secondary | ICD-10-CM | POA: Diagnosis not present

## 2023-09-17 MED ORDER — HEPARIN SOD (PORK) LOCK FLUSH 100 UNIT/ML IV SOLN
500.0000 [IU] | Freq: Once | INTRAVENOUS | Status: AC
  Administered 2023-09-17: 500 [IU]

## 2023-09-17 MED ORDER — SODIUM CHLORIDE 0.9% FLUSH
10.0000 mL | Freq: Once | INTRAVENOUS | Status: AC
  Administered 2023-09-17: 10 mL

## 2023-09-29 ENCOUNTER — Inpatient Hospital Stay

## 2023-09-29 ENCOUNTER — Other Ambulatory Visit: Payer: Self-pay

## 2023-09-29 ENCOUNTER — Inpatient Hospital Stay (HOSPITAL_BASED_OUTPATIENT_CLINIC_OR_DEPARTMENT_OTHER): Admitting: Oncology

## 2023-09-29 ENCOUNTER — Inpatient Hospital Stay: Attending: Radiation Oncology

## 2023-09-29 ENCOUNTER — Other Ambulatory Visit: Payer: Self-pay | Admitting: Oncology

## 2023-09-29 ENCOUNTER — Encounter: Payer: Self-pay | Admitting: Oncology

## 2023-09-29 VITALS — BP 173/97 | HR 75 | Temp 98.7°F | Resp 20 | Ht 64.0 in | Wt 124.4 lb

## 2023-09-29 DIAGNOSIS — Z95828 Presence of other vascular implants and grafts: Secondary | ICD-10-CM

## 2023-09-29 DIAGNOSIS — R7989 Other specified abnormal findings of blood chemistry: Secondary | ICD-10-CM | POA: Diagnosis not present

## 2023-09-29 DIAGNOSIS — I82C11 Acute embolism and thrombosis of right internal jugular vein: Secondary | ICD-10-CM

## 2023-09-29 DIAGNOSIS — R110 Nausea: Secondary | ICD-10-CM | POA: Insufficient documentation

## 2023-09-29 DIAGNOSIS — R635 Abnormal weight gain: Secondary | ICD-10-CM | POA: Diagnosis not present

## 2023-09-29 DIAGNOSIS — K219 Gastro-esophageal reflux disease without esophagitis: Secondary | ICD-10-CM | POA: Diagnosis not present

## 2023-09-29 DIAGNOSIS — C321 Malignant neoplasm of supraglottis: Secondary | ICD-10-CM

## 2023-09-29 DIAGNOSIS — R634 Abnormal weight loss: Secondary | ICD-10-CM | POA: Diagnosis not present

## 2023-09-29 DIAGNOSIS — Z9221 Personal history of antineoplastic chemotherapy: Secondary | ICD-10-CM | POA: Diagnosis not present

## 2023-09-29 DIAGNOSIS — Z7952 Long term (current) use of systemic steroids: Secondary | ICD-10-CM | POA: Diagnosis not present

## 2023-09-29 DIAGNOSIS — Z7901 Long term (current) use of anticoagulants: Secondary | ICD-10-CM | POA: Insufficient documentation

## 2023-09-29 DIAGNOSIS — F17210 Nicotine dependence, cigarettes, uncomplicated: Secondary | ICD-10-CM | POA: Insufficient documentation

## 2023-09-29 DIAGNOSIS — Z452 Encounter for adjustment and management of vascular access device: Secondary | ICD-10-CM | POA: Diagnosis present

## 2023-09-29 DIAGNOSIS — Z923 Personal history of irradiation: Secondary | ICD-10-CM | POA: Insufficient documentation

## 2023-09-29 DIAGNOSIS — Z79899 Other long term (current) drug therapy: Secondary | ICD-10-CM | POA: Diagnosis not present

## 2023-09-29 LAB — CBC WITH DIFFERENTIAL (CANCER CENTER ONLY)
Abs Immature Granulocytes: 0.01 10*3/uL (ref 0.00–0.07)
Basophils Absolute: 0 10*3/uL (ref 0.0–0.1)
Basophils Relative: 1 %
Eosinophils Absolute: 0.2 10*3/uL (ref 0.0–0.5)
Eosinophils Relative: 6 %
HCT: 42.4 % (ref 39.0–52.0)
Hemoglobin: 14.6 g/dL (ref 13.0–17.0)
Immature Granulocytes: 0 %
Lymphocytes Relative: 15 %
Lymphs Abs: 0.5 10*3/uL — ABNORMAL LOW (ref 0.7–4.0)
MCH: 30.7 pg (ref 26.0–34.0)
MCHC: 34.4 g/dL (ref 30.0–36.0)
MCV: 89.1 fL (ref 80.0–100.0)
Monocytes Absolute: 0.6 10*3/uL (ref 0.1–1.0)
Monocytes Relative: 17 %
Neutro Abs: 2.1 10*3/uL (ref 1.7–7.7)
Neutrophils Relative %: 61 %
Platelet Count: 177 10*3/uL (ref 150–400)
RBC: 4.76 MIL/uL (ref 4.22–5.81)
RDW: 15.4 % (ref 11.5–15.5)
WBC Count: 3.5 10*3/uL — ABNORMAL LOW (ref 4.0–10.5)
nRBC: 0 % (ref 0.0–0.2)

## 2023-09-29 LAB — CMP (CANCER CENTER ONLY)
ALT: 21 U/L (ref 0–44)
AST: 46 U/L — ABNORMAL HIGH (ref 15–41)
Albumin: 4.1 g/dL (ref 3.5–5.0)
Alkaline Phosphatase: 87 U/L (ref 38–126)
Anion gap: 11 (ref 5–15)
BUN: 5 mg/dL — ABNORMAL LOW (ref 6–20)
CO2: 25 mmol/L (ref 22–32)
Calcium: 9.4 mg/dL (ref 8.9–10.3)
Chloride: 107 mmol/L (ref 98–111)
Creatinine: 1.06 mg/dL (ref 0.61–1.24)
GFR, Estimated: >60 mL/min (ref >60)
Glucose, Bld: 74 mg/dL (ref 70–99)
Potassium: 4.8 mmol/L (ref 3.5–5.1)
Sodium: 143 mmol/L (ref 135–145)
Total Bilirubin: 0.6 mg/dL (ref 0.0–1.2)
Total Protein: 7.8 g/dL (ref 6.5–8.1)

## 2023-09-29 LAB — TSH: TSH: 1.13 u[IU]/mL (ref 0.350–4.500)

## 2023-09-29 LAB — MAGNESIUM: Magnesium: 2.1 mg/dL (ref 1.7–2.4)

## 2023-09-29 LAB — D-DIMER, QUANTITATIVE: D-Dimer, Quant: 1.37 ug{FEU}/mL — ABNORMAL HIGH (ref 0.00–0.50)

## 2023-09-29 MED ORDER — HEPARIN SOD (PORK) LOCK FLUSH 100 UNIT/ML IV SOLN: 500.0000 [IU] | Freq: Once | INTRAVENOUS | Status: DC

## 2023-09-29 MED ORDER — SODIUM CHLORIDE 0.9% FLUSH
10.0000 mL | Freq: Once | INTRAVENOUS | Status: DC
Start: 2023-09-29 — End: 2023-09-29

## 2023-09-29 NOTE — Assessment & Plan Note (Addendum)
 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  He is currently on Eliquis  5 mg p.o. twice daily.   D-dimer remains mildly elevated at 1.37 today.  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  If inconclusive, we may have to obtain a CT of the neck.  Depending on the results, we will determine if it is safe to discontinue Eliquis  and transition to aspirin 81 mg daily.

## 2023-09-29 NOTE — Progress Notes (Signed)
 D-dimer ordered with labs today, which must be drawn peripherally. Unable to draw from port. Lab appt made for pt.

## 2023-09-29 NOTE — Progress Notes (Signed)
 [facility]  ONCOLOGY CLINIC PROGRESS NOTE   Patient Care Team: Tobie Suzzane POUR, MD as PCP - General (Internal Medicine) [name], MD (Inactive) as Consulting Physician (Gastroenterology) Izell Domino, MD as Attending Physician (Radiation Oncology) Malmfelt, Delon CROME, RN as Oncology Nurse Navigator Autumn Millman, MD as Consulting Physician (Oncology) Karis Daniel Motts, MD as Referring Physician (Otolaryngology)  PATIENT NAME: [name]   MR#: [id number] DOB: 04-19-1970  Date of visit: 09/29/2023   ASSESSMENT & PLAN:   [name] is a 53 y.o. 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   Malignant neoplasm of supraglottis (HCC) Supraglottic cancer with one suspicious 7 mm lymph node on PET scan, classified as T3, N1, M0, stage III, low volume disease.   Discussed his case in our tumor conference on 04/14/2023.  Based on imaging studies, it is classified as cT3 lesion.  cN1.  Hence plan made to proceed with concurrent chemoradiation.  We discussed about role of cisplatin  being a radiosensitizer in the treatment of head and neck cancer.  We have discussed about the curative intent of chemoradiation for this patient.     -He began radiation treatments from 04/14/2023.  Began chemotherapy with cisplatin  from 04/19/2023 and completed 7 doses as of 06/08/2023.  Starting from cycle 6, we dose reduce cisplatin  to 30 mg/m because of cytopenias. He completed radiation treatments on 06/08/2023.  On 09/03/2023, restaging PET scan showed no evidence of disease.  Overall excellent response.  He is slowly recovering from chemoradiation related side effects.  Steadily making progress.  Labs today reveal white count of 3500 with normal differential.  Hemoglobin is now normal at 14.6.  Platelet count normal at  177,000.  Creatinine normal at 1.06.  No electrolyte disturbances.  We will submit request for removal of feeding tube and Port-A-Cath.  Going forward we will continue surveillance as per NCCN guidelines.  RTC in 6 months for follow-up with repeat labs and physical exam.  Internal jugular (IJ) vein thromboembolism, acute, right (/Acute DVT of Right IJV- acute occlusive thrombus throughout the right internal jugular vein. 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  He is currently on Eliquis  5 mg p.o. twice daily.   D-dimer remains mildly elevated at 1.37 today.  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  If inconclusive, we may have to obtain a CT of the neck.  Depending on the results, we will determine if it is safe to discontinue Eliquis  and transition to aspirin 81 mg daily.   I reviewed lab results and outside records for this visit and discussed relevant results with the patient. Diagnosis, plan of care and treatment options were also discussed in detail with the patient. Opportunity provided to ask questions and answers provided to his apparent satisfaction. Provided instructions to call our clinic with any problems, questions or concerns prior to return visit. I recommended to continue follow-up with PCP and sub-specialists. He verbalized understanding and agreed with the plan.   NCCN guidelines have been consulted in the planning of this patient's care.  I spent a total of 30 minutes during this encounter with the patient including review of chart and various tests results, discussions about plan of care and coordination of care plan.   Millman Autumn, MD  09/29/2023 2:06 PM  [facility] - A DEPT OF [facility] 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   Current Treatment: Concurrent chemoradiation with weekly cisplatin .  Radiation started from 04/14/2023.  Cisplatin  started from 04/19/2023.  Completed all treatments on 06/08/2023.  INTERVAL HISTORY:    Discussed the use of AI scribe software for clinical note transcription with the patient, who gave verbal consent to proceed.  History of Present Illness [name] is a 53 year old male who presents with difficulty gaining weight and decreased appetite.  He has experienced significant weight loss, dropping from his usual weight of 148 pounds to a current weight of 124-128 pounds. This weight loss is primarily attributed to a decreased appetite and a lack of taste, as he states 'I don't have all of my taste buds back.' He is working with a nutritionist named Elvie and prefers to eat orally despite a reduced appetite, having discontinued tube feeds.  The feeding tube often caused gastrointestinal discomfort, as he states 'it just goes straight through me anyway' and sometimes made him feel nauseous.  He is currently on Eliquis , a blood thinner, following the incidental discovery of a blood clot on an ultrasound three months ago.  He underwent a PET scan last month, which showed no evidence of residual cancer. Recent blood work revealed normal hemoglobin and platelet levels, with a slightly low white blood cell count.  He reports slight difficulty swallowing but is able to swallow. No significant appetite and notes a lack of taste.   I have reviewed the past medical history, past surgical history, social history and family history with the patient and they are unchanged from previous note.  HISTORY OF PRESENT ILLNESS:   ONCOLOGY HISTORY:   He initially presented to the [facility] on 10/28/22 with c/o left ear pain, popping with chewing, and pain radiating down the side of his neck and  throat x 1 month. He was treated for a left ear infection with a course of prednisone .   His symptoms including left ear and throat pain however persisted and progressed and he was subsequently referred to Dr. Karis on 03/10/23 at [facility] for further evaluation. During which time, the patient detailed that his pain first began in the ear and eventually progressed to involve the left side of his throat.  A laryngoscopy was subsequently performed at that time which revealed an ulcerative lesion on the posterior aspect of the epiglottis, superior to the vocal cords. The arytenoid mucosa was also mildly edematous and the true vocal folds were pale yellow, mobile, and without mass or lesion.       He accordingly underwent a direct laryngoscopy with biopsies of the left supraglottic tumor/mass on 03/16/23. Pathology showed findings consistent with invasive and in situ moderately differentiated squamous cell carcinoma. Per note by Dr Karis, Examination of the vallecula, piriform sinuses, and the pharyngeal mucosa were all normal. A fungating mass was noted on the posterior aspect of the left epiglottis. The mass did not appear to involve the vocal cords.   Soft tissue neck CT and PET scan on 03/23/23 were done - demonstrating a left supraglottic lesion which may involve the glottis as well as a 7 mm left level 3 lymph node  that is suspicious for metastatic involvement.  No distant metastatic disease.  cT3,cN1,cM0, Stage III disease.   Plan made to proceed with concurrent chemoradiation with weekly cisplatin .  Received concurrent chemoradiation starting during the week of 04/14/2023 and completed treatments on 06/08/2023.  On 09/03/2023, restaging PET scan showed no evidence of disease.  Overall excellent response.  Oncology History  Malignant neoplasm of supraglottis (HCC)  03/22/2023 Initial Diagnosis   Malignant neoplasm of supraglottis (HCC)   03/26/2023 Cancer Staging   Staging form: Larynx -  Supraglottis, AJCC 8th Edition - Clinical stage from 03/26/2023: Stage III (cT3, cN1, cM0) - Signed by Izell Domino, MD on 04/14/2023 Stage prefix: Initial diagnosis   04/19/2023 -  Chemotherapy   Patient is on Treatment Plan : HEAD/NECK Cisplatin  (40) q7d         REVIEW OF SYSTEMS:   Review of Systems - Oncology  All other pertinent systems were reviewed with the patient and are negative.  ALLERGIES: He has no known allergies.  MEDICATIONS:  Current Outpatient Medications  Medication Sig Dispense Refill   acetaminophen  (TYLENOL ) 500 MG tablet Take 1,000 mg by mouth at bedtime as needed for mild pain (pain score 1-3) or moderate pain (pain score 4-6).     amLODipine  (NORVASC ) 10 MG tablet Take 1 tablet (10 mg total) by mouth daily. 30 tablet 3   apixaban  (ELIQUIS ) 5 MG TABS tablet Take 5 mg by mouth in the morning and at bedtime 388 tablet 0   dexamethasone  (DECADRON ) 4 MG tablet Take 2 tablets (8 mg) by mouth daily x 3 days starting the day after cisplatin  chemotherapy. Take with food. 30 tablet 1   famotidine  (PEPCID ) 40 MG tablet Take 1 tablet (40 mg total) by mouth at bedtime.     FLUoxetine  (PROZAC ) 20 MG capsule Take 1 capsule (20 mg total) by mouth daily. 30 capsule 3   guaiFENesin -dextromethorphan  (ROBITUSSIN DM) 100-10 MG/5ML syrup Place 15 mLs into feeding tube every 6 (six) hours. (Patient taking differently: Take 15 mLs by mouth every 6 (six) hours.) 118 mL 0   hydrOXYzine  (ATARAX ) 25 MG tablet Take 1 tablet (25 mg total) by mouth 3 (three) times daily as needed for anxiety. (Patient taking differently: Take 25 mg by mouth at bedtime.) 90 tablet 1   lidocaine  (XYLOCAINE ) 2 % solution Patient: Mix 1part 2% viscous lidocaine , 1part H20. Swallow 10mL of diluted mixture, before meals and at bedtime, up to QID 200 mL 3   lidocaine -prilocaine  (EMLA ) cream Apply 1 Application topically as needed (port). 5 g 1   metoprolol  tartrate (LOPRESSOR ) 25 MG tablet Take 0.5 tablets  (12.5 mg total) by mouth 2 (two) times daily. 60 tablet 1   Nutritional Supplements (NUTREN 1.5) LIQD 95ml/hr for 18 hours/day (start at 3pm and stop feeding at 9am).  Flush of water  at 3pm, 7pm, 11pm and 9am.     ondansetron  (ZOFRAN ) 8 MG tablet Take 1 tablet (8 mg total) by mouth every 8 (eight) hours as needed for nausea or vomiting. Start on the third day after cisplatin . 30 tablet 1   oxyCODONE  10 MG TABS Take 1 tablet (10 mg total) by mouth every 6 (six) hours as needed for severe pain (pain score 7-10). 60 tablet 0   pantoprazole  (PROTONIX ) 40 MG tablet Take 1 tablet (40 mg total) by mouth 2 (two) times daily. 60 tablet 2   prochlorperazine  (COMPAZINE ) 10 MG tablet TAKE 1 TABLET(10 MG) BY MOUTH EVERY 6 HOURS AS NEEDED FOR NAUSEA  OR VOMITING 30 tablet 1   Protein (FEEDING SUPPLEMENT, PROSOURCE TF20,) liquid Place 60 mLs into feeding tube daily. 1800 mL 0   QUEtiapine  (SEROQUEL ) 100 MG tablet Take 1 tablet (100 mg total) by mouth at bedtime. 30 tablet 3   sucralfate  (CARAFATE ) 1 GM/10ML suspension Place 10 mLs (1 g total) into feeding tube in the morning, at noon, and at bedtime for 20 days. (Patient not taking: Reported on 09/29/2023) 600 mL 0   No current facility-administered medications for this visit.     VITALS:   Blood pressure (!) 173/97, pulse 75, temperature 98.7 F (37.1 C), temperature source Temporal, resp. rate 20, height 5' 4 (1.626 m), weight 124 lb 6.4 oz (56.4 kg), SpO2 98%.  Wt Readings from Last 3 Encounters:  09/29/23 124 lb 6.4 oz (56.4 kg)  09/07/23 128 lb 2 oz (58.1 kg)  08/04/23 132 lb (59.9 kg)    Body mass index is 21.35 kg/m.   Onc Performance Status - 09/29/23 1218       ECOG Perf Status   ECOG Perf Status Fully active, able to carry on all pre-disease performance without restriction      KPS SCALE   KPS % SCORE Able to carry on normal activity, minor s/s of disease            PHYSICAL EXAM:   Physical Exam Constitutional:       General: He is not in acute distress.    Appearance: Normal appearance.  HENT:     Head: Normocephalic and atraumatic.     Mouth/Throat:     Comments: No evidence of erythema, ulcers or other abnormal lesions. Eyes:     General: No scleral icterus.    Conjunctiva/sclera: Conjunctivae normal.  Neck:     Comments: Radiation associated skin changes have resolved Cardiovascular:     Rate and Rhythm: Normal rate and regular rhythm.     Heart sounds: Normal heart sounds.  Pulmonary:     Effort: Pulmonary effort is normal.     Breath sounds: Normal breath sounds.  Chest:     Comments: Port-A-Cath in place without any signs of infection Abdominal:     General: There is no distension.     Comments: Feeding tube in place, bandaged.  Musculoskeletal:     Right lower leg: No edema.     Left lower leg: No edema.  Neurological:     General: No focal deficit present.     Mental Status: He is alert and oriented to person, place, and time.  Psychiatric:        Mood and Affect: Mood normal.        Behavior: Behavior normal.        Thought Content: Thought content normal.       LABORATORY DATA:   I have reviewed the data as listed.  Results for orders placed or performed in visit on 09/29/23  D-dimer, quantitative  Result Value Ref Range   D-Dimer, Quant 1.37 (H) 0.00 - 0.50 ug/mL-FEU  Magnesium   Result Value Ref Range   Magnesium  2.1 1.7 - 2.4 mg/dL  CMP (Cancer Center only)  Result Value Ref Range   Sodium 143 135 - 145 mmol/L   Potassium 4.8 3.5 - 5.1 mmol/L   Chloride 107 98 - 111 mmol/L   CO2 25 22 - 32 mmol/L   Glucose, Bld 74 70 - 99 mg/dL   BUN 5 (L) 6 - 20 mg/dL   Creatinine 8.93 9.38 -  1.24 mg/dL   Calcium 9.4 8.9 - 89.6 mg/dL   Total Protein 7.8 6.5 - 8.1 g/dL   Albumin 4.1 3.5 - 5.0 g/dL   AST 46 (H) 15 - 41 U/L   ALT 21 0 - 44 U/L   Alkaline Phosphatase 87 38 - 126 U/L   Total Bilirubin 0.6 0.0 - 1.2 mg/dL   GFR, Estimated >39 >39 mL/min   Anion gap 11 5 - 15   CBC with Differential (Cancer Center Only)  Result Value Ref Range   WBC Count 3.5 (L) 4.0 - 10.5 K/uL   RBC 4.76 4.22 - 5.81 MIL/uL   Hemoglobin 14.6 13.0 - 17.0 g/dL   HCT 57.5 60.9 - 47.9 %   MCV 89.1 80.0 - 100.0 fL   MCH 30.7 26.0 - 34.0 pg   MCHC 34.4 30.0 - 36.0 g/dL   RDW 84.5 88.4 - 84.4 %   Platelet Count 177 150 - 400 K/uL   nRBC 0.0 0.0 - 0.2 %   Neutrophils Relative % 61 %   Neutro Abs 2.1 1.7 - 7.7 K/uL   Lymphocytes Relative 15 %   Lymphs Abs 0.5 (L) 0.7 - 4.0 K/uL   Monocytes Relative 17 %   Monocytes Absolute 0.6 0.1 - 1.0 K/uL   Eosinophils Relative 6 %   Eosinophils Absolute 0.2 0.0 - 0.5 K/uL   Basophils Relative 1 %   Basophils Absolute 0.0 0.0 - 0.1 K/uL   Immature Granulocytes 0 %   Abs Immature Granulocytes 0.01 0.00 - 0.07 K/uL     RADIOGRAPHIC STUDIES:  I have personally reviewed the radiological images as listed and agree with the findings in the report.  NM PET Image Restag (PS) Skull Base To Thigh Result Date: 09/07/2023 CLINICAL DATA:  Subsequent treatment strategy for head neck carcinoma. Pharyngeal carcinoma. EXAM: NUCLEAR MEDICINE PET SKULL BASE TO THIGH TECHNIQUE: 6.6 mCi F-18 FDG was injected intravenously. Full-ring PET imaging was performed from the skull base to thigh after the radiotracer. CT data was obtained and used for attenuation correction and anatomic localization. Fasting blood glucose: 103 mg/dl COMPARISON:  None Available. FINDINGS: NECK: No abnormal metabolic activity in the oropharynx or hypopharynx. No abnormal activity in the larynx. No hypermetabolic cervical lymph nodes or supraclavicular nodes. Incidental CT findings: None. CHEST: No hypermetabolic mediastinal or hilar nodes. No suspicious pulmonary nodules on the CT scan. Incidental CT findings: None. ABDOMEN/PELVIS: No abnormal hypermetabolic activity within the liver, pancreas, adrenal glands, or spleen. No hypermetabolic lymph nodes in the abdomen or pelvis. Incidental CT  findings: Peg tube in stomach. SKELETON: No focal hypermetabolic activity to suggest skeletal metastasis. Incidental CT findings: None. IMPRESSION: 1. No evidence of recurrent laryngeal carcinoma. 2. No evidence of metastatic adenopathy in the neck. 3. No evidence distant metastatic disease. Electronically Signed   By: Jackquline Boxer M.D.   On: 09/07/2023 12:26     CODE STATUS:  Code Status History     Date Active Date Inactive Code Status Order ID Comments User Context   06/23/2017 1540 06/28/2017 2102 Full Code [id number]  Janifer Mitzie Retort, NP Inpatient   06/23/2017 0327 06/23/2017 1502 Full Code [id number]  Merilee Harlene [facility]ATHEE [facility]   01/13/2017 0858 01/13/2017 1329 Full Code [id number]  [name], MD Inpatient   11/05/2016 0018 11/10/2016 1937 Full Code [id number]  Ray Jacques BRAVO, PA-C Inpatient       Orders Placed This Encounter  Procedures   US  SOFT TISSUE HEAD & NECK (NON-THYROID )  Standing Status:   Future    Expected Date:   10/04/2023    Expiration Date:   09/28/2024    Reason for Exam (SYMPTOM  OR DIAGNOSIS REQUIRED):   Hx of right IJ thrombus found on US  of carotids in Feb 2025. Please evaluate for any persistent clot.    Preferred imaging location?:   [facility]     Future Appointments  Date Time Provider Department Center  10/29/2023 12:15 PM [facility] FLUSH [facility] None  11/11/2023  1:40 PM Tobie Suzzane POUR, MD [facility] RPC  02/28/2024 10:00 AM Rankin, Shuvon B, NP [facility] None  04/07/2024 10:00 AM [facility] LAB [facility] None  04/07/2024 10:30 AM Meira Wahba, Chinita, MD [facility] None  04/12/2024 12:30 PM [facility]-ANNUAL WELLNESS VISIT [facility] RPC  08/08/2024 11:00 AM Wyatt Leeroy HERO, PA-C [facility] None      This document was completed utilizing speech recognition software. Grammatical errors, random word insertions, pronoun errors, and incomplete sentences are an occasional consequence of this system due to software limitations, ambient noise, and  hardware issues. Any formal questions or concerns about the content, text or information contained within the body of this dictation should be directly addressed to the provider for clarification.

## 2023-09-29 NOTE — Assessment & Plan Note (Addendum)
 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   Discussed his case in our tumor conference on 04/14/2023.  Based on imaging studies, it is classified as cT3 lesion.  cN1.  Hence plan made to proceed with concurrent chemoradiation.  We discussed about role of cisplatin  being a radiosensitizer in the treatment of head and neck cancer.  We have discussed about the curative intent of chemoradiation for this patient.     -He began radiation treatments from 04/14/2023.  Began chemotherapy with cisplatin  from 04/19/2023 and completed 7 doses as of 06/08/2023.  Starting from cycle 6, we dose reduce cisplatin  to 30 mg/m because of cytopenias. He completed radiation treatments on 06/08/2023.  On 09/03/2023, restaging PET scan showed no evidence of disease.  Overall excellent response.  He is slowly recovering from chemoradiation related side effects.  Steadily making progress.  Labs today reveal white count of 3500 with normal differential.  Hemoglobin is now normal at 14.6.  Platelet count normal at 177,000.  Creatinine normal at 1.06.  No electrolyte disturbances.  We will submit request for removal of feeding tube and Port-A-Cath.  Going forward we will continue surveillance as per NCCN guidelines.  RTC in 6 months for follow-up with repeat labs and physical exam.

## 2023-10-05 ENCOUNTER — Other Ambulatory Visit: Payer: Self-pay

## 2023-10-05 DIAGNOSIS — I82C11 Acute embolism and thrombosis of right internal jugular vein: Secondary | ICD-10-CM

## 2023-10-05 NOTE — Progress Notes (Signed)
 US  order placed for Internal jugular (IJ) vein thromboembolism, acute, right (/Acute DVT of Right IJV- acute occlusive thrombus throughout the right internal jugular vein. Dar.Crutch.C11 (ICD-10-CM)]  at [facility] per Dr. Autumn.

## 2023-10-07 ENCOUNTER — Ambulatory Visit (HOSPITAL_COMMUNITY): Admission: RE | Admit: 2023-10-07 | Source: Ambulatory Visit

## 2023-10-12 ENCOUNTER — Ambulatory Visit

## 2023-10-12 DIAGNOSIS — I82C11 Acute embolism and thrombosis of right internal jugular vein: Secondary | ICD-10-CM | POA: Diagnosis not present

## 2023-10-13 ENCOUNTER — Inpatient Hospital Stay (HOSPITAL_COMMUNITY): Admission: RE | Admit: 2023-10-13 | Source: Ambulatory Visit

## 2023-10-14 ENCOUNTER — Ambulatory Visit (HOSPITAL_COMMUNITY)
Admission: RE | Admit: 2023-10-14 | Discharge: 2023-10-14 | Disposition: A | Discharge status: Home / Self Care | Source: Ambulatory Visit | Attending: Oncology | Admitting: Oncology

## 2023-10-14 DIAGNOSIS — C321 Malignant neoplasm of supraglottis: Secondary | ICD-10-CM | POA: Insufficient documentation

## 2023-10-14 DIAGNOSIS — Z452 Encounter for adjustment and management of vascular access device: Secondary | ICD-10-CM | POA: Diagnosis not present

## 2023-10-14 HISTORY — PX: IR GASTROSTOMY TUBE REMOVAL: IMG5492

## 2023-10-14 HISTORY — PX: IR REMOVAL TUN ACCESS W/ PORT W/O FL MOD SED: IMG2290

## 2023-10-14 MED ORDER — LIDOCAINE-EPINEPHRINE 1 %-1:100000 IJ SOLN
20.0000 mL | Freq: Once | INTRAMUSCULAR | Status: AC
  Administered 2023-10-14: 10 mL via INTRADERMAL

## 2023-10-14 MED ORDER — LIDOCAINE VISCOUS HCL 2 % MT SOLN
15.0000 mL | Freq: Once | OROMUCOSAL | Status: AC
  Administered 2023-10-14: 5 mL via OROMUCOSAL

## 2023-10-14 MED ORDER — LIDOCAINE-EPINEPHRINE 1 %-1:100000 IJ SOLN
INTRAMUSCULAR | Status: AC
  Filled 2023-10-14: qty 1

## 2023-10-14 MED ORDER — LIDOCAINE VISCOUS HCL 2 % MT SOLN
OROMUCOSAL | Status: AC
  Filled 2023-10-14: qty 15

## 2023-10-14 NOTE — Procedures (Signed)
 Interventional Radiology Procedure:   Indications: Malignant neoplasm of supraglottis.  No longer needs port and gastrostomy tube  Procedure: Removal of right chest port and gastrostomy tube  Findings: Successful explant of right chest port.  Successful removal of gastrostomy tube with traction.   Complications: None     EBL: Minimal  Plan: Keep port incision dry and covered for 24 hours.   Henry Demeritt R. Philip, MD  Pager: [phone]

## 2023-10-26 ENCOUNTER — Emergency Department (HOSPITAL_COMMUNITY)

## 2023-10-26 ENCOUNTER — Encounter (HOSPITAL_COMMUNITY): Payer: Self-pay

## 2023-10-26 ENCOUNTER — Emergency Department (HOSPITAL_COMMUNITY): Admission: EM | Admit: 2023-10-26 | Discharge: 2023-10-27 | Disposition: A | Discharge status: Home / Self Care

## 2023-10-26 ENCOUNTER — Other Ambulatory Visit: Payer: Self-pay

## 2023-10-26 DIAGNOSIS — Z8673 Personal history of transient ischemic attack (TIA), and cerebral infarction without residual deficits: Secondary | ICD-10-CM | POA: Diagnosis not present

## 2023-10-26 DIAGNOSIS — R000 Tachycardia, unspecified: Secondary | ICD-10-CM | POA: Diagnosis not present

## 2023-10-26 DIAGNOSIS — S80919A Unspecified superficial injury of unspecified knee, initial encounter: Secondary | ICD-10-CM | POA: Diagnosis not present

## 2023-10-26 DIAGNOSIS — R109 Unspecified abdominal pain: Secondary | ICD-10-CM | POA: Diagnosis not present

## 2023-10-26 DIAGNOSIS — S50811A Abrasion of right forearm, initial encounter: Secondary | ICD-10-CM | POA: Insufficient documentation

## 2023-10-26 DIAGNOSIS — Z7901 Long term (current) use of anticoagulants: Secondary | ICD-10-CM | POA: Diagnosis not present

## 2023-10-26 DIAGNOSIS — R918 Other nonspecific abnormal finding of lung field: Secondary | ICD-10-CM | POA: Diagnosis not present

## 2023-10-26 DIAGNOSIS — Z8521 Personal history of malignant neoplasm of larynx: Secondary | ICD-10-CM | POA: Insufficient documentation

## 2023-10-26 DIAGNOSIS — M25561 Pain in right knee: Secondary | ICD-10-CM | POA: Diagnosis not present

## 2023-10-26 DIAGNOSIS — S3991XA Unspecified injury of abdomen, initial encounter: Secondary | ICD-10-CM | POA: Diagnosis not present

## 2023-10-26 DIAGNOSIS — S299XXA Unspecified injury of thorax, initial encounter: Secondary | ICD-10-CM | POA: Diagnosis not present

## 2023-10-26 DIAGNOSIS — F1010 Alcohol abuse, uncomplicated: Secondary | ICD-10-CM | POA: Diagnosis not present

## 2023-10-26 DIAGNOSIS — S8992XA Unspecified injury of left lower leg, initial encounter: Secondary | ICD-10-CM | POA: Diagnosis not present

## 2023-10-26 DIAGNOSIS — S8991XA Unspecified injury of right lower leg, initial encounter: Secondary | ICD-10-CM | POA: Diagnosis not present

## 2023-10-26 DIAGNOSIS — M25562 Pain in left knee: Secondary | ICD-10-CM | POA: Insufficient documentation

## 2023-10-26 DIAGNOSIS — S3993XA Unspecified injury of pelvis, initial encounter: Secondary | ICD-10-CM | POA: Diagnosis not present

## 2023-10-26 DIAGNOSIS — Y906 Blood alcohol level of 120-199 mg/100 ml: Secondary | ICD-10-CM | POA: Insufficient documentation

## 2023-10-26 DIAGNOSIS — Y92410 Unspecified street and highway as the place of occurrence of the external cause: Secondary | ICD-10-CM | POA: Diagnosis not present

## 2023-10-26 DIAGNOSIS — E872 Acidosis, unspecified: Secondary | ICD-10-CM | POA: Diagnosis not present

## 2023-10-26 DIAGNOSIS — S199XXA Unspecified injury of neck, initial encounter: Secondary | ICD-10-CM | POA: Diagnosis not present

## 2023-10-26 DIAGNOSIS — S0990XA Unspecified injury of head, initial encounter: Secondary | ICD-10-CM | POA: Diagnosis not present

## 2023-10-26 DIAGNOSIS — R079 Chest pain, unspecified: Secondary | ICD-10-CM | POA: Diagnosis not present

## 2023-10-26 DIAGNOSIS — R1084 Generalized abdominal pain: Secondary | ICD-10-CM | POA: Diagnosis not present

## 2023-10-26 LAB — I-STAT CHEM 8, ED
BUN: 18 mg/dL (ref 6–20)
Calcium, Ion: 1.08 mmol/L — ABNORMAL LOW (ref 1.15–1.40)
Chloride: 108 mmol/L (ref 98–111)
Creatinine, Ser: 1.3 mg/dL — ABNORMAL HIGH (ref 0.61–1.24)
Glucose, Bld: 80 mg/dL (ref 70–99)
HCT: 47 % (ref 39.0–52.0)
Hemoglobin: 16 g/dL (ref 13.0–17.0)
Potassium: 4.1 mmol/L (ref 3.5–5.1)
Sodium: 141 mmol/L (ref 135–145)
TCO2: 20 mmol/L — ABNORMAL LOW (ref 22–32)

## 2023-10-26 LAB — CBC
HCT: 43.6 % (ref 39.0–52.0)
Hemoglobin: 14.3 g/dL (ref 13.0–17.0)
MCH: 30.8 pg (ref 26.0–34.0)
MCHC: 32.8 g/dL (ref 30.0–36.0)
MCV: 93.8 fL (ref 80.0–100.0)
Platelets: 240 K/uL (ref 150–400)
RBC: 4.65 MIL/uL (ref 4.22–5.81)
RDW: 15.8 % — ABNORMAL HIGH (ref 11.5–15.5)
WBC: 7.1 K/uL (ref 4.0–10.5)
nRBC: 0 % (ref 0.0–0.2)

## 2023-10-26 LAB — COMPREHENSIVE METABOLIC PANEL WITH GFR
ALT: 20 U/L (ref 0–44)
AST: 28 U/L (ref 15–41)
Albumin: 3.7 g/dL (ref 3.5–5.0)
Alkaline Phosphatase: 58 U/L (ref 38–126)
Anion gap: 17 — ABNORMAL HIGH (ref 5–15)
BUN: 15 mg/dL (ref 6–20)
CO2: 16 mmol/L — ABNORMAL LOW (ref 22–32)
Calcium: 8.8 mg/dL — ABNORMAL LOW (ref 8.9–10.3)
Chloride: 105 mmol/L (ref 98–111)
Creatinine, Ser: 1.16 mg/dL (ref 0.61–1.24)
GFR, Estimated: >60 mL/min (ref >60)
Glucose, Bld: 74 mg/dL (ref 70–99)
Potassium: 4.2 mmol/L (ref 3.5–5.1)
Sodium: 138 mmol/L (ref 135–145)
Total Bilirubin: 0.7 mg/dL (ref 0.0–1.2)
Total Protein: 7.2 g/dL (ref 6.5–8.1)

## 2023-10-26 LAB — I-STAT CG4 LACTIC ACID, ED
Lactic Acid, Venous: 3.9 mmol/L (ref 0.5–1.9)
Lactic Acid, Venous: 3.6 mmol/L (ref 0.5–1.9)
Lactic Acid, Venous: 4.4 mmol/L (ref 0.5–1.9)

## 2023-10-26 LAB — SAMPLE TO BLOOD BANK

## 2023-10-26 LAB — PROTIME-INR
INR: 1 (ref 0.8–1.2)
Prothrombin Time: 14.1 s (ref 11.4–15.2)

## 2023-10-26 LAB — ETHANOL: Alcohol, Ethyl (B): 192 mg/dL — ABNORMAL HIGH (ref <15)

## 2023-10-26 MED ORDER — SODIUM CHLORIDE 0.9 % IV BOLUS
1000.0000 mL | Freq: Once | INTRAVENOUS | Status: AC
  Administered 2023-10-26: 1000 mL via INTRAVENOUS

## 2023-10-26 MED ORDER — FENTANYL CITRATE PF 50 MCG/ML IJ SOSY
50.0000 ug | PREFILLED_SYRINGE | Freq: Once | INTRAMUSCULAR | Status: AC
  Administered 2023-10-26: 50 ug via INTRAVENOUS
  Filled 2023-10-26: qty 1

## 2023-10-26 MED ORDER — LACTATED RINGERS IV BOLUS
1000.0000 mL | Freq: Once | INTRAVENOUS | Status: AC
  Administered 2023-10-26: 1000 mL via INTRAVENOUS

## 2023-10-26 MED ORDER — IOHEXOL 350 MG/ML SOLN
75.0000 mL | Freq: Once | INTRAVENOUS | Status: AC | PRN
  Administered 2023-10-26: 75 mL via INTRAVENOUS

## 2023-10-26 NOTE — Discharge Instructions (Addendum)
 Please follow-up with your primary doctors.  Need to take over-the-counter medications such as Tylenol  for pain.  Return if develop fevers, chills, sudden onset headache, chest pain, shortness of breath or any new or worsening symptoms that are concerning to you
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
## 2023-10-26 NOTE — ED Notes (Signed)
Transported patient to CT.

## 2023-10-26 NOTE — ED Notes (Signed)
 Returned from CT, attached patient to all cardiac monitors, will continue to monitor appropriately.

## 2023-10-27 LAB — LACTIC ACID, PLASMA: Lactic Acid, Venous: 3.8 mmol/L (ref 0.5–1.9)

## 2023-10-27 LAB — CK: Total CK: 90 U/L (ref 49–397)

## 2023-10-29 ENCOUNTER — Inpatient Hospital Stay

## 2023-11-11 ENCOUNTER — Encounter: Payer: Self-pay | Admitting: Internal Medicine

## 2023-11-11 ENCOUNTER — Telehealth (INDEPENDENT_AMBULATORY_CARE_PROVIDER_SITE_OTHER): Payer: Self-pay | Admitting: Otolaryngology

## 2023-11-11 ENCOUNTER — Ambulatory Visit (INDEPENDENT_AMBULATORY_CARE_PROVIDER_SITE_OTHER): Admitting: Internal Medicine

## 2023-11-11 VITALS — BP 136/80 | HR 94 | Ht 64.0 in | Wt 122.6 lb

## 2023-11-11 DIAGNOSIS — K219 Gastro-esophageal reflux disease without esophagitis: Secondary | ICD-10-CM | POA: Diagnosis not present

## 2023-11-11 DIAGNOSIS — C321 Malignant neoplasm of supraglottis: Secondary | ICD-10-CM

## 2023-11-11 DIAGNOSIS — F4001 Agoraphobia with panic disorder: Secondary | ICD-10-CM | POA: Diagnosis not present

## 2023-11-11 DIAGNOSIS — I10 Essential (primary) hypertension: Secondary | ICD-10-CM | POA: Diagnosis not present

## 2023-11-11 DIAGNOSIS — Z59819 Housing instability, housed unspecified: Secondary | ICD-10-CM | POA: Insufficient documentation

## 2023-11-11 DIAGNOSIS — I82C11 Acute embolism and thrombosis of right internal jugular vein: Secondary | ICD-10-CM | POA: Diagnosis not present

## 2023-11-11 DIAGNOSIS — F333 Major depressive disorder, recurrent, severe with psychotic symptoms: Secondary | ICD-10-CM | POA: Diagnosis not present

## 2023-11-11 DIAGNOSIS — F411 Generalized anxiety disorder: Secondary | ICD-10-CM | POA: Diagnosis not present

## 2023-11-11 MED ORDER — FAMOTIDINE 40 MG PO TABS: 40.0000 mg | ORAL_TABLET | Freq: Every day | ORAL | 3 refills | Status: AC

## 2023-11-11 MED ORDER — AMLODIPINE BESYLATE 10 MG PO TABS: 10.0000 mg | ORAL_TABLET | Freq: Every day | ORAL | 1 refills | Status: AC

## 2023-11-11 MED ORDER — METOPROLOL TARTRATE 25 MG PO TABS: 12.5000 mg | ORAL_TABLET | Freq: Two times a day (BID) | ORAL | 1 refills | Status: AC

## 2023-11-11 MED ORDER — QUETIAPINE FUMARATE 100 MG PO TABS: 100.0000 mg | ORAL_TABLET | Freq: Every day | ORAL | 1 refills | Status: AC

## 2023-11-11 MED ORDER — FLUOXETINE HCL 20 MG PO CAPS: 20.0000 mg | ORAL_CAPSULE | Freq: Every day | ORAL | 1 refills | Status: AC

## 2023-11-11 NOTE — Assessment & Plan Note (Signed)
 Uncontrolled due to noncompliance Had started Prozac  20 mg once daily, advised to stay compliant Refilled Seroquel  100 mg nightly as he was responding well to it, has lost follow-up with psychiatry Used to be followed by Psychiatry - Dr. Cathyann Cobia, referred to [facility] clinic Check CMP, TSH - wnl

## 2023-11-11 NOTE — Assessment & Plan Note (Signed)
 BP Readings from Last 1 Encounters:  11/11/23 136/80   Usually well-controlled with amlodipine  10 mg QD and metoprolol  12.5 mg BID - refilled Counseled for compliance with the medications Advised DASH diet and moderate exercise/walking, at least 150 mins/week

## 2023-11-11 NOTE — Assessment & Plan Note (Signed)
 Uncontrolled as he has run out of his medications Restart Prozac 20 mg QD Refilled hydroxyzine 25 mg 3 times daily as needed Referred to psychiatry - [facility] clinic in [place]

## 2023-11-11 NOTE — Assessment & Plan Note (Signed)
 Completed chemoradiation Followed by ENT specialist, Heme/Onc. and radiation oncology

## 2023-11-11 NOTE — Assessment & Plan Note (Signed)
 Usually well-controlled Continue Pepcid  40 mg QD

## 2023-11-11 NOTE — Patient Instructions (Signed)
 Please start taking medications as prescribed.  Please contact social security department or law enforcement to get access to your medications and personal belongings.

## 2023-11-11 NOTE — Telephone Encounter (Signed)
 Called patient to schedule appointment for patient to see Dr. Karis in September for follow-up after treatment.  Patient stated he cannot schedule at this time due to transportation and personal issues.  Will call back when he feels he can.
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
## 2023-11-11 NOTE — Assessment & Plan Note (Addendum)
 Recently worse due to being homeless and not having his medicines Restart Prozac  and Seroquel  - new prescription sent Hydroxyzine  as needed for anxiety/panic episode  Referred to [facility] for medication compliance and social worker consultation

## 2023-11-11 NOTE — Assessment & Plan Note (Addendum)
 Was found in 02/25 On Eliquis  5 mg BID Followed by Heme/Onc.

## 2023-11-15 ENCOUNTER — Telehealth: Payer: Self-pay

## 2023-11-15 NOTE — Progress Notes (Signed)
 Complex Care Management Note  Care Guide Note 11/15/2023 Name: [name] MRN: [id number] DOB: 07/22/1970  [name] is a 53 y.o. year old male who sees Tobie Suzzane POUR, MD for primary care. I reached out to [name] by phone today to offer complex care management services.  [name] was given information about Complex Care Management services today including:   The Complex Care Management services include support from the care team which includes your Nurse Care Manager, Clinical Social Worker, or Pharmacist.  The Complex Care Management team is here to help remove barriers to the health concerns and goals most important to you. Complex Care Management services are voluntary, and the patient may decline or stop services at any time by request to their care team member.   Complex Care Management Consent Status: Patient agreed to services and verbal consent obtained.   Follow up plan:  Telephone appointment with complex care management team member scheduled for:  [facility] 11/25/2023 LCSW 11/22/2023  Encounter Outcome:  Patient Scheduled  [name] , RMA     [place]  [facility] Guide  Direct Dial: [phone]  Website: [email or web address]

## 2023-11-22 ENCOUNTER — Other Ambulatory Visit: Payer: Self-pay | Admitting: Licensed Clinical Social Worker

## 2023-11-22 DIAGNOSIS — F411 Generalized anxiety disorder: Secondary | ICD-10-CM

## 2023-11-22 DIAGNOSIS — F4001 Agoraphobia with panic disorder: Secondary | ICD-10-CM

## 2023-11-22 DIAGNOSIS — F333 Major depressive disorder, recurrent, severe with psychotic symptoms: Secondary | ICD-10-CM

## 2023-11-22 DIAGNOSIS — R413 Other amnesia: Secondary | ICD-10-CM

## 2023-11-22 DIAGNOSIS — Z59819 Housing instability, housed unspecified: Secondary | ICD-10-CM

## 2023-11-22 NOTE — Patient Instructions (Signed)
 Visit Information  Thank you for taking time to visit with me today. Please don't hesitate to contact me if I can be of assistance to you before our next scheduled appointment.  Our next appointment is by telephone on 12/07/23 at 130 Please call the care guide team at [phone] if you need to cancel or reschedule your appointment.   Following is a copy of your care plan:   Goals Addressed             This Visit's Progress    LCSW [facility] Social Work Care Plan       Current barriers:   SDOH Barriers Need for housing resources Need for dental resources Need for Financial Assistance and connection to available community resources Loss and depressive symptoms due to recent separation from spouse of 30 years   Clinical Goals: Patient will work with agencies/resources/PCP/ and [facility] SW team to address needs related to gaining housing resources to help promote the safety and health of patient.  Clinical Interventions:  Inter-disciplinary care team collaboration (see longitudinal plan of care) Patient provided patient history.  Assessment of needs, progress, barriers completed. Advised patient to answer all calls from care providers and to keep phone nearby. Advised parents to contact 911 or [phone] if a crisis occurs.  Clinical interventions provided:Solution-Focused Strategies, Active listening / Reflection utilized , Problem Solving /Task Center ,  Patient's main support network includes niece where he is temporarily residing at  [facility] team updated on 8/25 Therapy referral placed on 8/25 with pt's permission  Patient Goals/Self-Care Activities: Call provider office for new concerns or questions Consider bumping up self-care  Call [phone] in case of an emergency        Please call the Suicide and Crisis Lifeline: 988 go to [facility] [address] [place] [phone]) call the [facility] Crisis Line: [phone] if you are experiencing a  Mental Health or Behavioral Health Crisis or need someone to talk to.  Patient verbalizes understanding of instructions and care plan provided today and agrees to view in MyChart. Active MyChart status and patient understanding of how to access instructions and care plan via MyChart confirmed with patient.     Lyle Rung, BSW, MSW, LCSW Licensed Clinical Social Worker [employer]   [facility] [email or web address] Direct Dial: [phone]

## 2023-11-22 NOTE — Patient Outreach (Addendum)
 Complex Care Management   Visit Note  11/22/2023  Name:  [name] MRN: [id number] DOB: 1971/02/19  Situation: Referral received for Complex Care Management related to SDOH Barriers:  Housing (recent loss of housing), Substance Abuse, Food insecurity and Depression. I obtained verbal consent from Patient.  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   Substance induced mood disorder (HCC) 11/05/2016   Syncope 05/21/2023    Assessment: Patient Reported Symptoms:  Cognitive Cognitive Status: Able to follow simple commands, Normal speech and language skills, Struggling with memory recall Cognitive/Intellectual Conditions Management [RPT]: Other Other: Memory concerns noted in pt's chart and dx list   Health Maintenance Behaviors: Annual physical exam Healing Pattern: Average Health Facilitated by: Rest, Stress management  Neurological Neurological Review of Symptoms: Weakness, Other: [place] Neurological Symptoms/Conditions [RPT]: Stress is causing adverse affects Neurological Management Strategies: Adequate rest, Coping strategies, Routine screening Neurological Self-Management Outcome: 3 (uncertain)  HEENT HEENT Symptoms Reported: No symptoms reported      Cardiovascular Cardiovascular Symptoms Reported: No symptoms reported Does patient have uncontrolled Hypertension?: Yes    Respiratory Respiratory Symptoms Reported: No symptoms reported    Endocrine       Gastrointestinal Gastrointestinal Symptoms Reported: Reflux/heartburn Additional Gastrointestinal Details: GERD      Genitourinary Genitourinary Symptoms Reported: No symptoms reported    Integumentary Integumentary Symptoms Reported: No symptoms reported    Psychosocial Psychosocial Symptoms Reported: Sadness - if selected complete PHQ 2-9, Anxiety - if selected complete GAD, Depression - if selected complete PHQ 2-9 Behavioral Management Strategies: Medication therapy, Coping strategies, Adequate rest Behavioral Health Self-Management Outcome: 2 (bad) Major Change/Loss/Stressor/Fears (CP): Relationship concerns, Medical condition, self Techniques to Cope with Loss/Stress/Change: Medication Quality of Family Relationships: disruptive, stressful, involved Do you feel physically threatened by others?: No    11/22/2023    PHQ2-9 Depression Screening   Little interest or pleasure in doing things Several days  Feeling down, depressed, or hopeless More than half the days  PHQ-2 - Total Score 3  Trouble falling or staying asleep, or sleeping too much Several days  Feeling tired or having little energy More than half the days  Poor appetite or overeating  Not at all  Feeling bad about yourself - or that you are a failure or have let yourself or your family down Several days  Trouble concentrating on things, such as reading the newspaper or watching television Not at all  Moving or speaking so slowly that other people could have noticed.  Or the opposite - being so fidgety or restless that you have been moving around a lot more than usual Not at all  Thoughts that you would be better off dead, or hurting yourself in some way Not at all  PHQ2-9 Total Score 7  If you checked off any problems, how difficult have these problems made it for you to do  your work, take care of things at home, or get along with other people Very difficult  Depression Interventions/Treatment Medication, Community  Resources Provided    There were no vitals filed for this visit.  Medications Reviewed Today     Reviewed by Merlynn Lyle CROME, LCSW (Social Worker) on 11/22/23 at [phone]  Med List Status: <None>   Medication Order Taking? Sig Documenting Provider Last Dose Status Informant  acetaminophen  (TYLENOL ) 500 MG tablet [id number]  Take 1,000 mg by mouth at bedtime as needed for mild pain (pain score 1-3) or moderate pain (pain score 4-6).  Patient not taking: Reported on 11/11/2023   [provider]  Active Self, Pharmacy Records, Spouse/Significant Other  amLODipine  (NORVASC ) 10 MG tablet [id number]  Take 1 tablet (10 mg total) by mouth daily. Tobie Suzzane POUR, MD  Active   apixaban  (ELIQUIS ) 5 MG TABS tablet [id number]  Take 5 mg by mouth in the morning and at bedtime  Patient not taking: Reported on 11/11/2023   Dezii, Alexandra, DO  Active   dexamethasone  (DECADRON ) 4 MG tablet [id number]  Take 2 tablets (8 mg) by mouth daily x 3 days starting the day after cisplatin  chemotherapy. Take with food.  Patient not taking: Reported on 11/11/2023   Autumn Millman, MD  Active Self, Pharmacy Records, Spouse/Significant Other           Med Note MARISA, NATHANEL LOISE Kitchens May 03, 2023  8:40 PM)    famotidine  (PEPCID ) 40 MG tablet [id number]  Take 1 tablet (40 mg total) by mouth daily. Tobie Suzzane POUR, MD  Active   FLUoxetine  (PROZAC ) 20 MG capsule [id number]  Take 1 capsule (20 mg total) by mouth daily. Tobie Suzzane POUR, MD  Active   hydrOXYzine  (ATARAX ) 25 MG tablet [id number]  Take 1 tablet (25 mg total) by mouth 3 (three) times daily as needed for anxiety.  Patient not taking: Reported on 11/11/2023   Tobie Suzzane POUR, MD  Active Self, Pharmacy Records, Spouse/Significant Other  lidocaine -prilocaine  (EMLA ) cream [id number]  Apply 1 Application topically as needed (port).  Patient not taking: Reported on 11/11/2023   Wyatt Leeroy HERO, PA-C  Active   metoprolol  tartrate (LOPRESSOR ) 25 MG tablet [id number]  Take 0.5  tablets (12.5 mg total) by mouth 2 (two) times daily. Tobie Suzzane POUR, MD  Active   Nutritional Supplements (NUTREN 1.5) BERNICE [id number]  49ml/hr for 18 hours/day (start at 3pm and stop feeding at 9am).  Flush of water  at 3pm, 7pm, 11pm and 9am.  Patient not taking: Reported on 11/11/2023   Izell Domino, MD  Active Self, Spouse/Significant Other, Pharmacy Records  oxyCODONE  10 MG TABS [id number]  Take 1 tablet (10 mg total) by mouth every 6 (six) hours as needed for severe pain (pain score 7-10).  Patient not taking: Reported on 11/11/2023   Autumn Millman, MD  Active Self, Spouse/Significant Other, Pharmacy Records  Protein (FEEDING SUPPLEMENT, PROSOURCE TF20,) liquid [id number]  Place 60 mLs into feeding tube daily.  Patient not taking: Reported on 11/11/2023   Elpidio Reyes DEL, MD  Active Self, Spouse/Significant Other, Pharmacy Records  QUEtiapine  (SEROQUEL ) 100 MG tablet [id number]  Take 1 tablet (100 mg total) by mouth at bedtime. Tobie Suzzane POUR, MD  Active            SDOH Screenings   Food Insecurity: Food Insecurity Present (11/22/2023)  Housing: High Risk (11/22/2023)  Transportation Needs: No Transportation Needs (06/14/2023)  Utilities: Not At Risk (06/14/2023)  Alcohol Screen:  Medium Risk (11/22/2023)  Depression (PHQ2-9): Medium Risk (11/22/2023)  Financial Resource Strain: High Risk (11/22/2023)  Social Connections: Socially Integrated (06/14/2023)  Stress: Stress Concern Present (11/22/2023)  Tobacco Use: Medium Risk (11/11/2023)   Recommendation:   PCP Follow-up Specialty provider follow-up therapy referral placed Continue Current Plan of Care  Follow Up Plan:   Telephone follow-up in 1 month  Lyle Rung, BSW, MSW, LCSW Licensed Clinical Social Worker [employer]   [email or web address] Direct Dial: [phone]

## 2023-11-25 ENCOUNTER — Other Ambulatory Visit: Payer: Self-pay | Admitting: Internal Medicine

## 2023-11-25 ENCOUNTER — Other Ambulatory Visit: Payer: Self-pay | Admitting: *Deleted

## 2023-11-25 DIAGNOSIS — I10 Essential (primary) hypertension: Secondary | ICD-10-CM

## 2023-11-25 MED ORDER — MISC. DEVICES MISC
0 refills | Status: AC
Start: 2023-11-25 — End: ?
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
## 2023-11-25 NOTE — Patient Instructions (Signed)
 Visit Information  Thank you for taking time to visit with me today. Please don't hesitate to contact me if I can be of assistance to you before our next scheduled appointment.  Our next appointment is by telephone on 12-08-2023 at 3:00 pm Please call the care guide team at [phone] if you need to cancel or reschedule your appointment.   Following is a copy of your care plan:   Goals Addressed             This Visit's Progress    VBCI RN Care Plan: HTN       Problems:  Chronic Disease Management support and education needs related to HTN  Goal: Over the next 90 days the Patient will attend all scheduled medical appointments: with primary care provider and specialist as evidenced by keeping all scheduled appointments        continue to work with RN Care Manager and/or Social Worker to address care management and care coordination needs related to HTN as evidenced by adherence to care management team scheduled appointments     take all medications exactly as prescribed and will call provider for medication related questions as evidenced by compliance with all medications    verbalize basic understanding of HTN disease process and self health management plan as evidenced by verbal explanation, recognizing/monitoring symptoms  Interventions:   Hypertension Interventions: Last practice recorded BP readings:  BP Readings from Last 3 Encounters:  11/11/23 136/80  10/27/23 (!) 158/88  09/29/23 (!) 173/97   Most recent eGFR/CrCl:  Lab Results  Component Value Date   EGFR 102 02/04/2023    No components found for: CRCL  Evaluation of current treatment plan related to hypertension self management and patient's adherence to plan as established by provider Provided education to patient re: stroke prevention, s/s of heart attack and stroke Reviewed medications with patient and discussed importance of compliance Counseled on adverse effects of illicit drug and excessive alcohol use  in patients with high blood pressure  Provided assistance with obtaining home blood pressure monitor -  request sent to PCP office. Counseled on the importance of exercise goals with target of 150 minutes per week Discussed plans with patient for ongoing care management follow up and provided patient with direct contact information for care management team Advised patient, providing education and rationale, to monitor blood pressure daily and record, calling PCP for findings outside established parameters Reviewed scheduled/upcoming provider appointments including: 12-14-2023 with PCP Advised patient to discuss acute changes / elevated BP readings with provider Provided education on prescribed diet DASH Discussed complications of poorly controlled blood pressure such as heart disease, stroke, circulatory complications, vision complications, kidney impairment, sexual dysfunction Screening for signs and symptoms of depression related to chronic disease state.  Follow up scheduled with LCSW 12-07-2023 Patient reports overall weakness and vocal hoarseness. RNCM sent in basket message to provider to request referral for outpatient PT and SLP for vocal hoarseness s/p completion of radiation/chemo treatments r/t throat cancer.  Patient Self-Care Activities:  Attend all scheduled provider appointments Call pharmacy for medication refills 3-7 days in advance of running out of medications Call provider office for new concerns or questions  Perform all self care activities independently  Perform IADL's (shopping, preparing meals, housekeeping, managing finances) independently Take medications as prescribed   Work with the social worker to address care coordination needs and will continue to work with the clinical team to address health care and disease management related needs check blood pressure daily call  doctor for signs and symptoms of high blood pressure keep all doctor appointments take medications  for blood pressure exactly as prescribed begin an exercise program report new symptoms to your doctor eat more whole grains, fruits and vegetables, lean meats and healthy fats  Plan:  Telephone follow up appointment with care management team member scheduled for:  12-08-2023 at 3:00 pm             Please call the Suicide and Crisis Lifeline: 988 call the [place]  National Suicide Prevention Lifeline: 3314316019 or TTY: 3520323859 TTY 430-770-0669) to talk to a trained counselor call 1-800-273-TALK (toll free, 24 hour hotline) call the [facility]: [phone] call 911 if you are experiencing a Mental Health or Behavioral Health Crisis or need someone to talk to.  Patient verbalizes understanding of instructions and care plan provided today and agrees to view in MyChart. Active MyChart status and patient understanding of how to access instructions and care plan via MyChart confirmed with patient.     Rosina Forte, BSN RN [facility], [facility] Health RN Care Manager Direct Dial: [phone]  Fax: [phone]

## 2023-11-26 ENCOUNTER — Other Ambulatory Visit: Payer: Self-pay

## 2023-11-26 NOTE — Patient Instructions (Signed)
 Visit Information  Thank you for taking time to visit with me today. Please don't hesitate to contact me if I can be of assistance to you before our next scheduled appointment.  Your next care management appointment is by telephone on 12/07/23 at 11am  Please call the care guide team at [phone] if you need to cancel, schedule, or reschedule an appointment.   Please call 911 if you are experiencing a Mental Health or Behavioral Health Crisis or need someone to talk to.  Tillman Gardener, BSW [place]  [facility], [facility] Social Worker Direct Dial: [phone]  Fax: [phone] Website: [email or web address]

## 2023-11-26 NOTE — Patient Outreach (Signed)
 Complex Care Management   Visit Note  11/26/2023  Name:  [name] MRN: [id number] DOB: Mar 19, 1971  Situation: Referral received for Complex Care Management related to SDOH Barriers:  Housing homeless Food insecurity I obtained verbal consent from Patient.  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   Substance induced mood disorder (HCC) 11/05/2016   Syncope 05/21/2023    Assessment:  Patient reports he has been homeless for 2-3 weeks. Patient has applied for a few income based housing and will apply for a house that is within his budget. Family is available to help. Living with niece with no specific deadline on moving out. SW reviewed budget with [phone] SSA and patient can afford $600 rent. Patients daughter picked up Rx and personal items from his home and other items are in storage. SW will email a list of additional food bank, although patient has received assistance from a local food bank. SW will email foodstamp online link.   SDOH Interventions    Flowsheet Row Patient Outreach Telephone from 11/26/2023 in [place] POPULATION HEALTH DEPARTMENT Patient Outreach Telephone from 11/25/2023 in [place] POPULATION HEALTH DEPARTMENT Patient Outreach Telephone from 11/22/2023 in [place] POPULATION HEALTH DEPARTMENT Nutrition from 09/07/2023 in [facility] Med Onc - A Dept Of [place]. [facility] ED to Hosp-Admission (Discharged) from 05/03/2023 in [facility] 3  [place] [facility] IMRT TREATMENT from 04/30/2023 in [facility] Radiation Oncology  SDOH Interventions        Food Insecurity Interventions Intervention Not Indicated  [Assisted at food banks] Community Resources Provided [employer] Referral, AMB Referral, Community Resources Provided Other (Comment)  [Ensure] Intervention Not Indicated, Inpatient TOC --  Housing Interventions -- Other (Comment)  [Working with SW] AMB Referral, Programmer, applications Provided, [employer] Referral -- Intervention Not Indicated, Inpatient [employer] --  Transportation Interventions -- Patient Resources Dietitian) -- -- Intervention Not Indicated, Inpatient TOC Taxi Voucher Given, Other (Comment), Contracted Lobbyist does not have resources to get to and from appt on his own through payor benefits or personal means, thus setup through us  via [employer]]  Utilities Interventions -- Intervention Not Indicated -- -- Intervention Not Indicated, Inpatient TOC --  Depression Interventions/Treatment  -- Medication Medication, Programmer, applications Provided -- -- --  Surveyor, quantity Strain Interventions -- -- [employer] Provided -- -- --  Stress Interventions -- -- [employer] Provided, Provide Counseling -- -- --      Recommendation:   None  Follow Up Plan:   Telephone follow up appointment date/time:  12/07/23 at 11am  Tillman Gardener, BSW [place]  [facility], [facility] Social Worker Direct Dial: [phone]  Fax: [phone] Website: [email or web address]

## 2023-12-07 ENCOUNTER — Telehealth: Admitting: Licensed Clinical Social Worker

## 2023-12-07 ENCOUNTER — Other Ambulatory Visit: Payer: Self-pay

## 2023-12-07 NOTE — Patient Instructions (Signed)

## 2023-12-07 NOTE — Patient Outreach (Signed)
 Complex Care Management   Visit Note  12/07/2023  Name:  [name] MRN: [id number] DOB: [birth year]  Situation: Referral received for Complex Care Management related to SDOH Barriers:  Housing homeless Food insecurity I obtained verbal consent from Patient.  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   Substance induced mood disorder (HCC) 11/05/2016   Syncope 05/21/2023    Assessment:  Patient has been assisted weekly at a local food bank. Things are working well with living with his niece and patient will wait for housing. Patient will continue to look for housing.    SDOH Interventions    Flowsheet Row Patient Outreach Telephone from 11/26/2023 in [place] POPULATION HEALTH DEPARTMENT Patient Outreach Telephone from 11/25/2023 in [place] POPULATION HEALTH DEPARTMENT Patient Outreach Telephone from 11/22/2023 in [place] POPULATION HEALTH DEPARTMENT Nutrition from 09/07/2023 in [facility] WL Med Onc - A Dept Of [place]. [facility] ED to Hosp-Admission (Discharged) from 05/03/2023 in [facility] 3 [place] [facility] IMRT TREATMENT from 04/30/2023 in [facility] Radiation Oncology  SDOH Interventions        Food Insecurity Interventions Intervention Not Indicated  [Assisted at food banks] Community Resources Provided [employer] Referral, AMB Referral, Community Resources Provided Other (Comment)  [Ensure] Intervention Not  Indicated, Inpatient TOC --  Housing Interventions -- Other (Comment)  [Working with SW] AMB Referral, Programmer, applications Provided, [employer] Referral -- Intervention Not Indicated, Inpatient [employer] --  Transportation Interventions -- Patient Resources Dietitian) -- -- Intervention Not Indicated, Inpatient TOC Taxi Voucher Given, Other (Comment), Contracted Lobbyist does not have resources to get to and from appt on his own through payor benefits or personal means, thus setup through us  via [employer]]  Utilities Interventions -- Intervention Not Indicated -- -- Intervention Not Indicated, Inpatient TOC --  Depression Interventions/Treatment  -- Medication Medication, Programmer, applications Provided -- -- --  Surveyor, quantity Strain Interventions -- -- [employer] Provided -- -- --  Stress Interventions -- -- [employer] Provided, Provide Counseling -- -- --    Recommendation:   none  Follow Up Plan:   Patient has met all care management goals. Care Management case will be closed. Patient has been provided contact information should new needs arise.   Tillman Gardener, BSW [place]  [facility], [facility] Social Worker Direct Dial: [phone]  Fax: [phone] Website: [email or web address]

## 2023-12-08 ENCOUNTER — Other Ambulatory Visit: Payer: Self-pay | Admitting: *Deleted

## 2023-12-08 NOTE — Patient Outreach (Signed)
 Complex Care Management   Visit Note  12/08/2023  Name:  [name] MRN: [id number] DOB: 07-01-1970  Situation: Referral received for Complex Care Management related to HTN I obtained verbal consent from Patient.  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   Substance induced mood disorder (HCC) 11/05/2016   Syncope 05/21/2023    Assessment: Patient Reported Symptoms:  Cognitive Cognitive Status: No symptoms reported   Health Maintenance Behaviors: Annual physical exam Healing Pattern: Average Health Facilitated by: Rest  Neurological Neurological Review of Symptoms: No symptoms reported    HEENT HEENT Symptoms Reported: Sore throat      Cardiovascular Cardiovascular Symptoms Reported: No symptoms reported Does patient have uncontrolled Hypertension?: No Cardiovascular Management Strategies: Medication therapy, Routine screening Cardiovascular Self-Management Outcome: 4 (good)  Respiratory Respiratory Symptoms Reported: Chest tightness    Endocrine Endocrine Symptoms Reported: No symptoms reported Is patient diabetic?: No    Gastrointestinal Gastrointestinal Symptoms Reported: No symptoms reported      Genitourinary Genitourinary Symptoms Reported: No symptoms reported    Integumentary Integumentary Symptoms Reported: No symptoms reported    Musculoskeletal Musculoskelatal Symptoms Reviewed: Weakness   Falls in the past year?: Yes Number of falls in  past year: 2 or more Was there an injury with Fall?: No Fall Risk Category Calculator: 2 Patient Fall Risk Level: Moderate Fall Risk Patient at Risk for Falls Due to: History of fall(s), Impaired balance/gait  Psychosocial Psychosocial Symptoms Reported: Anxiety - if selected complete GAD, Depression - if selected complete PHQ 2-9, Sadness - if selected complete PHQ 2-9 Behavioral Health Self-Management Outcome: 3 (uncertain) Major Change/Loss/Stressor/Fears (CP): Medical condition, self Techniques to Cope with Loss/Stress/Change: Medication Quality of Family Relationships: stressful Do you feel physically threatened by others?: Yes    12/08/2023    PHQ2-9 Depression Screening   Little interest or pleasure in doing things Nearly every day  Feeling down, depressed, or hopeless Nearly every day  PHQ-2 - Total Score 6  Trouble falling or staying asleep, or sleeping too much Nearly every day  Feeling tired or having little energy Nearly every day  Poor appetite or overeating  Nearly every day  Feeling bad about yourself - or that you are a failure or have let yourself or your family down Several days  Trouble concentrating on things, such as reading the newspaper or watching television Not at all  Moving or speaking so slowly that other people could have noticed.  Or the opposite - being so fidgety or restless that you have been moving around a lot more than usual Not at all  Thoughts that you would be better off dead, or hurting yourself in some way Not at all  PHQ2-9 Total Score 16  If you checked off any problems, how difficult have these problems made it for you to do your work, take care of things at home,  or get along with other people Somewhat difficult  Depression Interventions/Treatment Medication    There were no vitals filed for this visit.  Medications Reviewed Today     Reviewed by Bertrum Rosina HERO, RN (Registered Nurse) on 12/08/23 at 1511  Med List Status: <None>    Medication Order Taking? Sig Documenting Provider Last Dose Status Informant  acetaminophen  (TYLENOL ) 500 MG tablet [id number]  Take 1,000 mg by mouth at bedtime as needed for mild pain (pain score 1-3) or moderate pain (pain score 4-6). [provider]  Active Self, Pharmacy Records, Spouse/Significant Other  amLODipine  (NORVASC ) 10 MG tablet [id number]  Take 1 tablet (10 mg total) by mouth daily. Tobie Suzzane POUR, MD  Active   apixaban  (ELIQUIS ) 5 MG TABS tablet [id number]  Take 5 mg by mouth in the morning and at bedtime Dezii, Alexandra, DO  Active   dexamethasone  (DECADRON ) 4 MG tablet [id number]  Take 2 tablets (8 mg) by mouth daily x 3 days starting the day after cisplatin  chemotherapy. Take with food.  Patient not taking: Reported on 11/25/2023   Autumn Millman, MD  Active Self, Pharmacy Records, Spouse/Significant Other           Med Note MARISA, NATHANEL LOISE Kitchens May 03, 2023  8:40 PM)    famotidine  (PEPCID ) 40 MG tablet [id number]  Take 1 tablet (40 mg total) by mouth daily. Tobie Suzzane POUR, MD  Active   FLUoxetine  (PROZAC ) 20 MG capsule [id number]  Take 1 capsule (20 mg total) by mouth daily. Tobie Suzzane POUR, MD  Active   hydrOXYzine  (ATARAX ) 25 MG tablet [id number]  Take 1 tablet (25 mg total) by mouth 3 (three) times daily as needed for anxiety.  Patient not taking: Reported on 11/25/2023   Tobie Suzzane POUR, MD  Active Self, Pharmacy Records, Spouse/Significant Other  lidocaine -prilocaine  (EMLA ) cream [id number]  Apply 1 Application topically as needed (port).  Patient not taking: Reported on 11/25/2023   Wyatt Leeroy HERO, PA-C  Active   metoprolol  tartrate (LOPRESSOR ) 25 MG tablet [id number]  Take 0.5 tablets (12.5 mg total) by mouth 2 (two) times daily. Tobie Suzzane POUR, MD  Active   Misc. Devices MISC [id number]  Blood pressure cuff/device - 1. ICD10: I10 Tobie Suzzane POUR, MD  Active   Nutritional Supplements (NUTREN 1.5) BERNICE [id number]  24ml/hr for 18 hours/day (start at 3pm and stop feeding  at 9am).  Flush of water  at 3pm, 7pm, 11pm and 9am.  Patient not taking: Reported on 11/25/2023   Izell Domino, MD  Active Self, Spouse/Significant Other, Pharmacy Records  oxyCODONE  10 MG TABS [id number]  Take 1 tablet (10 mg total) by mouth every 6 (six) hours as needed for severe pain (pain score 7-10).  Patient not taking: Reported on 11/25/2023   Autumn Millman, MD  Active Self, Spouse/Significant Other, Pharmacy Records  Protein (FEEDING SUPPLEMENT, PROSOURCE TF20,) liquid [id number]  Place 60 mLs into feeding tube daily.  Patient not taking: Reported on 11/25/2023   Elpidio Reyes DEL, MD  Active Self, Spouse/Significant Other, Pharmacy Records  QUEtiapine  (SEROQUEL ) 100 MG tablet [id number]  Take 1 tablet (100 mg total) by mouth at bedtime. Tobie Suzzane POUR, MD  Active             Recommendation:   Continue Current Plan of Care  Follow Up Plan:   Telephone follow-up in 1 month  Rosina Bertrum, BSN RN [facility], [facility] RN Care Manager Direct Dial: [phone]  Fax: [phone]

## 2023-12-08 NOTE — Patient Instructions (Signed)
 Visit Information  Thank you for taking time to visit with me today. Please don't hesitate to contact me if I can be of assistance to you before our next scheduled appointment.  Your next care management appointment is by telephone on 01-07-2024 at 2:30 pm  Telephone follow-up in 1 month  Please call the care guide team at [phone] if you need to cancel, schedule, or reschedule an appointment.   Please call the Suicide and Crisis Lifeline: 988 call the [place]  National Suicide Prevention Lifeline: 818-136-5973 or TTY: (805)217-5416 TTY (804)317-1651) to talk to a trained counselor call 1-800-273-TALK (toll free, 24 hour hotline) if you are experiencing a Mental Health or Behavioral Health Crisis or need someone to talk to.  Rosina Forte, BSN RN [facility], [facility] RN Care Manager Direct Dial: [phone]  Fax: [phone]

## 2023-12-13 ENCOUNTER — Other Ambulatory Visit: Payer: Self-pay | Admitting: Licensed Clinical Social Worker

## 2023-12-13 NOTE — Patient Outreach (Signed)
 Complex Care Management   Visit Note  12/13/2023  Name:  [name] MRN: [id number] DOB: 02/15/1971  Situation: Referral received for Complex Care Management related to Mental/Behavioral Health diagnosis GAD and Substance Abuse/Misuse . I obtained verbal consent from Patient.  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   Substance induced mood disorder (HCC) 11/05/2016   Syncope 05/21/2023    Assessment: Patient Reported Symptoms:  Cognitive Cognitive Status: No symptoms reported Cognitive/Intellectual Conditions Management [RPT]: Other Other: Memory Deficits   Health Maintenance Behaviors: Annual physical exam Healing Pattern: Average Health Facilitated by: Rest  Neurological Neurological Review of Symptoms: No symptoms reported Neurological Management Strategies: Coping strategies, Routine screening Neurological Self-Management Outcome: 3 (uncertain)  Psychosocial Psychosocial Symptoms Reported: Anxiety - if selected complete GAD Additional Psychological Details: Therapy referral pending but patient does have a difficult time talking Behavioral Management Strategies: Medication therapy, Coping strategies Behavioral Health Self-Management Outcome: 3 (uncertain) Major Change/Loss/Stressor/Fears (CP): Medical condition, self Techniques to Cope with Loss/Stress/Change: Medication Quality of Family Relationships: stressful      12/13/2023    3:00 PM 12/08/2023     3:17 PM 11/25/2023    4:05 PM 11/22/2023    9:10 AM  GAD 7 : Generalized Anxiety Score  Nervous, Anxious, on Edge 2 3 3 1   Control/stop worrying 1 1 1 1   Worry too much - different things 3 3 3 1   Trouble relaxing 1 3 3  0  Restless 0 0 0 0  Easily annoyed or irritable 0 0 0 0  Afraid - awful might happen 1 1 1 1   Total GAD 7 Score 8 11 11 4   Anxiety Difficulty Very difficult Very difficult Very difficult Very difficult   There were no vitals filed for this visit.  Medications Reviewed Today     Reviewed by Merlynn Lyle LITTIE, LCSW (Social Worker) on 12/13/23 at 1459  Med List Status: <None>   Medication Order Taking? Sig Documenting Provider Last Dose Status Informant  acetaminophen  (TYLENOL ) 500 MG tablet [id number]  Take 1,000 mg by mouth at bedtime as needed for mild pain (pain score 1-3) or moderate pain (pain score 4-6). [provider]  Active Self, Pharmacy Records, Spouse/Significant Other  amLODipine  (NORVASC ) 10 MG tablet [id number]  Take 1 tablet (10 mg total) by mouth daily. Tobie Suzzane POUR, MD  Active   apixaban  (ELIQUIS ) 5 MG TABS tablet [id number]  Take 5 mg by mouth in the morning and at bedtime Dezii, Alexandra, DO  Active   dexamethasone  (DECADRON ) 4 MG tablet [id number]  Take 2 tablets (8 mg) by mouth daily x 3 days starting the day after cisplatin  chemotherapy. Take with food.  Patient not taking: Reported on 11/25/2023   Autumn Millman, MD  Active Self, Pharmacy Records, Spouse/Significant Other           Med Note [place], NATHANEL SAILOR  Mon May 03, 2023  8:40 PM)    famotidine  (PEPCID ) 40 MG tablet [id number]  Take 1 tablet (40 mg total) by mouth daily. Tobie Suzzane POUR, MD  Active   FLUoxetine  (PROZAC ) 20 MG capsule [id number]  Take 1 capsule (20 mg total) by mouth daily. Tobie Suzzane POUR, MD  Active   hydrOXYzine  (ATARAX ) 25 MG tablet [id number]  Take 1 tablet (25 mg total) by mouth 3 (three) times daily as needed for anxiety.  Patient not taking: Reported on 11/25/2023    Tobie Suzzane POUR, MD  Active Self, Pharmacy Records, Spouse/Significant Other  lidocaine -prilocaine  (EMLA ) cream [id number]  Apply 1 Application topically as needed (port).  Patient not taking: Reported on 11/25/2023   Wyatt Leeroy HERO, PA-C  Active   metoprolol  tartrate (LOPRESSOR ) 25 MG tablet [id number]  Take 0.5 tablets (12.5 mg total) by mouth 2 (two) times daily. Tobie Suzzane POUR, MD  Active   Misc. Devices MISC [id number]  Blood pressure cuff/device - 1. ICD10: I10 Tobie Suzzane POUR, MD  Active   Nutritional Supplements (NUTREN 1.5) BERNICE [id number]  42ml/hr for 18 hours/day (start at 3pm and stop feeding at 9am).  Flush of water  at 3pm, 7pm, 11pm and 9am.  Patient not taking: Reported on 11/25/2023   Izell Domino, MD  Active Self, Spouse/Significant Other, Pharmacy Records  oxyCODONE  10 MG TABS [id number]  Take 1 tablet (10 mg total) by mouth every 6 (six) hours as needed for severe pain (pain score 7-10).  Patient not taking: Reported on 11/25/2023   Autumn Millman, MD  Active Self, Spouse/Significant Other, Pharmacy Records  Protein (FEEDING SUPPLEMENT, PROSOURCE TF20,) liquid [id number]  Place 60 mLs into feeding tube daily.  Patient not taking: Reported on 11/25/2023   Elpidio Reyes DEL, MD  Active Self, Spouse/Significant Other, Pharmacy Records  QUEtiapine  (SEROQUEL ) 100 MG tablet [id number]  Take 1 tablet (100 mg total) by mouth at bedtime. Tobie Suzzane POUR, MD  Active             Recommendation:   PCP Follow-up Continue Current Plan of Care  Follow Up Plan:   Telephone follow-up in 1 month  Lyle Rung, BSW, MSW, LCSW Licensed Clinical Social Worker [employer]   [email or web address] Direct Dial: [phone]

## 2023-12-13 NOTE — Patient Instructions (Signed)
 Visit Information  Thank you for taking time to visit with me today. Please don't hesitate to contact me if I can be of assistance to you before our next scheduled appointment.  Our next appointment is by telephone on 01/03/14 at 130 Please call the care guide team at [phone] if you need to cancel or reschedule your appointment.   Following is a copy of your care plan:   Goals Addressed             This Visit's Progress    LCSW VBCI Social Work Care Plan       Current barriers:   SDOH Barriers Alcohol and Substance Abuse Need for housing resources Need for dental resources Need for Financial Assistance and connection to available community resources Loss and depressive symptoms due to recent separation from spouse of 30 years   Clinical Goals: Patient will work with agencies/resources/PCP/ and VBCI SW team to address needs related to gaining housing resources to help promote the safety and health of patient.  Clinical Interventions:  Inter-disciplinary care team collaboration (see longitudinal plan of care) Patient provided patient history.  Relapse prevention education provided to patient. AA support group encouraged. Assessment of needs, progress, barriers completed. Advised patient to answer all calls from care providers and to keep phone nearby. Advised parents to contact 911 or 988 if a crisis occurs.  Clinical interventions provided:Solution-Focused Strategies, Active listening / Reflection utilized , Problem Solving /Task Center ,  Patient's main support network includes niece where he is temporarily residing at  [facility] referral placed on 8/25 with pt's permission  Patient was reminded of PCP appointment tomorrow at 11:20 am. He reports that he forgot about this appointment but will be able to attend. He reports that his family will transport him but he will need transportation arrangements for ENT appointment next month. [facility]'s number provided [phone]. VBCI  BSW follow up appointment confirmed for 12/15/23.  Patient Goals/Self-Care Activities: Call provider office for new concerns or questions Consider bumping up self-care  Call 988 in case of an emergency        Please call the Suicide and Crisis Lifeline: 988 call the [place]  National Suicide Prevention Lifeline: 715-838-4405 or TTY: 254 329 9304 TTY 314 225 7516) to talk to a trained counselor call 1-800-273-TALK (toll free, 24 hour hotline) go to [facility] [address], [place] [phone]) call the [facility] Crisis Line: (213)650-8280 call 911 if you are experiencing a Mental Health or Behavioral Health Crisis or need someone to talk to.  Patient verbalizes understanding of instructions and care plan provided today and agrees to view in MyChart. Active MyChart status and patient understanding of how to access instructions and care plan via MyChart confirmed with patient.     Lyle Rung, BSW, MSW, LCSW Licensed Clinical Social Worker [employer]   [facility] [email or web address] Direct Dial: [phone]

## 2023-12-14 ENCOUNTER — Encounter: Payer: Self-pay | Admitting: Internal Medicine

## 2023-12-14 ENCOUNTER — Ambulatory Visit (INDEPENDENT_AMBULATORY_CARE_PROVIDER_SITE_OTHER): Admitting: Internal Medicine

## 2023-12-14 VITALS — BP 116/68 | HR 96 | Ht 64.0 in

## 2023-12-14 DIAGNOSIS — E559 Vitamin D deficiency, unspecified: Secondary | ICD-10-CM | POA: Diagnosis not present

## 2023-12-14 DIAGNOSIS — R7303 Prediabetes: Secondary | ICD-10-CM | POA: Diagnosis not present

## 2023-12-14 DIAGNOSIS — C321 Malignant neoplasm of supraglottis: Secondary | ICD-10-CM

## 2023-12-14 DIAGNOSIS — Z125 Encounter for screening for malignant neoplasm of prostate: Secondary | ICD-10-CM

## 2023-12-14 DIAGNOSIS — F333 Major depressive disorder, recurrent, severe with psychotic symptoms: Secondary | ICD-10-CM

## 2023-12-14 DIAGNOSIS — I10 Essential (primary) hypertension: Secondary | ICD-10-CM

## 2023-12-14 DIAGNOSIS — F411 Generalized anxiety disorder: Secondary | ICD-10-CM | POA: Diagnosis not present

## 2023-12-14 DIAGNOSIS — J209 Acute bronchitis, unspecified: Secondary | ICD-10-CM | POA: Diagnosis not present

## 2023-12-14 DIAGNOSIS — E782 Mixed hyperlipidemia: Secondary | ICD-10-CM | POA: Diagnosis not present

## 2023-12-14 MED ORDER — AZITHROMYCIN 250 MG PO TABS: ORAL_TABLET | ORAL | 0 refills | Status: AC

## 2023-12-14 MED ORDER — METHYLPREDNISOLONE 4 MG PO TBPK: ORAL_TABLET | ORAL | 0 refills | Status: AC

## 2023-12-14 MED ORDER — BENZONATATE 200 MG PO CAPS: 200.0000 mg | ORAL_CAPSULE | Freq: Two times a day (BID) | ORAL | 0 refills | Status: AC | PRN

## 2023-12-14 NOTE — Assessment & Plan Note (Signed)
 BP Readings from Last 1 Encounters:  12/14/23 116/68   Usually well-controlled with amlodipine  10 mg QD and metoprolol  12.5 mg BID - refilled Counseled for compliance with the medications Advised DASH diet and moderate exercise/walking, at least 150 mins/week

## 2023-12-14 NOTE — Patient Instructions (Addendum)
 Please start taking Azithromycin  as prescribed.  Please start taking Prednisone  as prescribed - tapering dose.  Please take Tessalon  as needed for cough.  Please continue to take medications as prescribed.  Please continue to follow low salt diet and perform moderate exercise/walking as tolerated.

## 2023-12-14 NOTE — Assessment & Plan Note (Signed)
 Completed chemoradiation Followed by ENT specialist, Heme/Onc. and radiation oncology
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
## 2023-12-15 ENCOUNTER — Other Ambulatory Visit: Payer: Self-pay

## 2023-12-15 NOTE — Patient Outreach (Signed)
 Complex Care Management   Visit Note  12/15/2023  Name:  [name] MRN: [id number] DOB: 09-03-1970  Situation: Referral received for Complex Care Management related to SDOH Barriers:  Transportation I obtained verbal consent from Patient.  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   Substance induced mood disorder (HCC) 11/05/2016   Syncope 05/21/2023    Assessment:  Patient has an appointment in [facility] 12/29/23.  Patients insurance does not provide transportation.  Patient has exhausted his SSA benefits and can not pay for transportation service or ride share like [place].  [facility] does not provide out of county transportation.  Patient has family that helps and will speak to his sister to arrange transportation.  SDOH Interventions    Flowsheet Row Patient Outreach Telephone from 12/13/2023 in [place] POPULATION HEALTH DEPARTMENT Patient Outreach Telephone from 12/08/2023 in [place] POPULATION HEALTH DEPARTMENT Patient Outreach Telephone from 11/26/2023 in [place] POPULATION HEALTH DEPARTMENT Patient Outreach Telephone from 11/25/2023 in [place] POPULATION HEALTH DEPARTMENT Patient Outreach Telephone from 11/22/2023 in [place] POPULATION HEALTH DEPARTMENT Nutrition from 09/07/2023 in [facility] - [facility] Dept Of [place]. [facility]  SDOH Interventions        Food Insecurity Interventions -- -- Intervention Not Indicated  [Assisted at food  banks] Community Resources Provided [employer] Referral, AMB Referral, Community Resources Provided Other (Comment)  [Ensure]  Housing Interventions -- -- -- Other (Comment)  [Working with SW] AMB Referral, Programmer, applications Provided, [employer] Referral --  Transportation Interventions -- -- -- Patient Resources Dietitian) -- --  Utilities Interventions -- -- -- Intervention Not Indicated -- --  Depression Interventions/Treatment  -- Medication -- Medication Medication, Programmer, applications Provided --  Financial Strain Interventions -- -- -- -- Programmer, applications Provided --  Stress Interventions [employer] Provided -- -- -- [employer] Provided, Provide Counseling --      Recommendation:   None  Follow Up Plan:   Telephone follow up appointment date/time:  12/20/23 at 1pm  Tillman Gardener, BSW [place]  [facility], [facility] Social Worker Direct Dial: [phone]  Fax: [phone] Website: [email or web address]

## 2023-12-15 NOTE — Patient Instructions (Signed)
 Visit Information  Thank you for taking time to visit with me today. Please don't hesitate to contact me if I can be of assistance to you before our next scheduled appointment.  Your next care management appointment is by telephone on 12/20/23 at 1pm   Please call the care guide team at [phone] if you need to cancel, schedule, or reschedule an appointment.   Please call 911 if you are experiencing a Mental Health or Behavioral Health Crisis or need someone to talk to.  Tillman Gardener, BSW [place]  [facility], [facility] Social Worker Direct Dial: [phone]  Fax: [phone] Website: [email or web address]

## 2023-12-16 DIAGNOSIS — Z125 Encounter for screening for malignant neoplasm of prostate: Secondary | ICD-10-CM | POA: Insufficient documentation

## 2023-12-16 DIAGNOSIS — R7303 Prediabetes: Secondary | ICD-10-CM | POA: Insufficient documentation

## 2023-12-16 DIAGNOSIS — J209 Acute bronchitis, unspecified: Secondary | ICD-10-CM | POA: Insufficient documentation

## 2023-12-16 NOTE — Assessment & Plan Note (Signed)
 Due to recent worsening of cough and dyspnea, started Medrol  Dosepak Started empiric azithromycin  Advised to contact ENT specialist as he has worsening of hoarseness of voice Advised to perform warm water  gargling

## 2023-12-16 NOTE — Assessment & Plan Note (Signed)
 Uncontrolled due to noncompliance, but better compared to previous visit Had started Prozac  20 mg once daily, advised to stay compliant Refilled Seroquel  100 mg nightly as he was responding well to it, has lost follow-up with psychiatry Used to be followed by Psychiatry - Dr. Barbra, [facility] Check CMP, TSH - wnl

## 2023-12-16 NOTE — Assessment & Plan Note (Signed)
 Better controlled since restarting his medications On Prozac  20 mg QD Refilled hydroxyzine  25 mg 3 times daily as needed Referred to psychiatry - BH clinic in [place]

## 2023-12-16 NOTE — Assessment & Plan Note (Signed)
 Lab Results  Component Value Date   HGBA1C 5.8 (H) 02/04/2023   Advised to follow DASH diet

## 2023-12-16 NOTE — Assessment & Plan Note (Signed)
 Ordered PSA after discussing its limitations for prostate cancer screening, including false positive results leading to additional investigations.

## 2023-12-20 ENCOUNTER — Other Ambulatory Visit: Payer: Self-pay

## 2023-12-20 NOTE — Patient Instructions (Signed)

## 2023-12-20 NOTE — Patient Outreach (Signed)
 Complex Care Management   Visit Note  12/20/2023  Name:  [name] MRN: [id number] DOB: 07/11/1970  Situation: Referral received for Complex Care Management related to SDOH Barriers:  Transportation I obtained verbal consent from Patient.  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   Substance induced mood disorder (HCC) 11/05/2016   Syncope 05/21/2023    Assessment:  Patient was able to arrange transportation to [place] to see the ENT specialist and will arrange any follow up with sister to provide transportation. Case close goal completed.  SDOH Interventions    Flowsheet Row Patient Outreach Telephone from 12/13/2023 in [place] POPULATION HEALTH DEPARTMENT Patient Outreach Telephone from 12/08/2023 in [place] POPULATION HEALTH DEPARTMENT Patient Outreach Telephone from 11/26/2023 in [place] POPULATION HEALTH DEPARTMENT Patient Outreach Telephone from 11/25/2023 in [place] POPULATION HEALTH DEPARTMENT Patient Outreach Telephone from 11/22/2023 in [place] POPULATION HEALTH DEPARTMENT Nutrition from 09/07/2023 in [facility] - A Dept Of [place]. [facility]  SDOH Interventions        Food Insecurity Interventions -- -- Intervention Not Indicated  [Assisted at food banks] Community Resources Provided [employer] Referral, AMB Referral, Community Resources Provided Other (Comment)  [Ensure]  Housing Interventions -- -- --  Other (Comment)  [Working with SW] AMB Referral, Programmer, applications Provided, [employer] Referral --  Transportation Interventions -- -- -- Patient Resources Dietitian) -- --  Utilities Interventions -- -- -- Intervention Not Indicated -- --  Depression Interventions/Treatment  -- Medication -- Medication Medication, Programmer, applications Provided --  Corporate treasurer Interventions -- -- -- -- Programmer, applications Provided --  Stress Interventions [employer] Provided -- -- -- [employer] Provided, Provide Counseling --    Recommendation:   None  Follow Up Plan:   Patient has met all care management goals. Care Management case will be closed. Patient has been provided contact information should new needs arise.   Tillman Gardener, BSW [place]  [facility], [facility] Social Worker Direct Dial: [phone]  Fax: [phone] Website: [email or web address]

## 2023-12-29 ENCOUNTER — Ambulatory Visit (INDEPENDENT_AMBULATORY_CARE_PROVIDER_SITE_OTHER): Admitting: Otolaryngology

## 2024-01-04 ENCOUNTER — Telehealth: Admitting: Licensed Clinical Social Worker

## 2024-01-07 ENCOUNTER — Other Ambulatory Visit: Payer: Self-pay | Admitting: *Deleted

## 2024-01-07 NOTE — Patient Instructions (Signed)
 Visit Information  Thank you for taking time to visit with me today. Please don't hesitate to contact me if I can be of assistance to you before our next scheduled appointment.  Your next care management appointment is by telephone on 02-07-2024 at 2:00 pm  Telephone follow-up in 1 month  Please call the care guide team at [phone] if you need to cancel, schedule, or reschedule an appointment.   Please call the Suicide and Crisis Lifeline: 988 call the [place]  National Suicide Prevention Lifeline: 337-556-6999 or TTY: 671-765-1228 TTY (606) 301-0570) to talk to a trained counselor call 1-800-273-TALK (toll free, 24 hour hotline) if you are experiencing a Mental Health or Behavioral Health Crisis or need someone to talk to.  Rosina Forte, BSN RN [facility], [facility] Health RN Care Manager Direct Dial: [phone]  Fax: [phone]

## 2024-01-07 NOTE — Patient Outreach (Signed)
 Complex Care Management   Visit Note  01/07/2024  Name:  [name] MRN: [id number] DOB: May 08, 1970  Situation: Referral received for Complex Care Management related to HTN I obtained verbal consent from Patient.  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   Substance induced mood disorder (HCC) 11/05/2016   Syncope 05/21/2023    Assessment: Patient Reported Symptoms:  Cognitive Cognitive Status: No symptoms reported   Health Maintenance Behaviors: Annual physical exam Healing Pattern: Average Health Facilitated by: Rest  Neurological Neurological Review of Symptoms: No symptoms reported Neurological Self-Management Outcome: 4 (good)  HEENT HEENT Symptoms Reported: Sore throat HEENT Management Strategies: Routine screening HEENT Self-Management Outcome: 4 (good)    Cardiovascular Cardiovascular Symptoms Reported: No symptoms reported Does patient have uncontrolled Hypertension?: No Cardiovascular Self-Management Outcome: 4 (good)  Respiratory Respiratory Symptoms Reported: No symptoms reported Respiratory Management Strategies: Routine screening Respiratory Self-Management Outcome: 4 (good)  Endocrine Endocrine Symptoms Reported: No symptoms reported Is patient diabetic?: No Endocrine Self-Management Outcome: 4 (good)  Gastrointestinal Gastrointestinal Symptoms Reported: No symptoms reported Gastrointestinal Self-Management Outcome: 4 (good)     Genitourinary Genitourinary Symptoms Reported: No symptoms reported    Integumentary Integumentary Symptoms Reported: No symptoms reported    Musculoskeletal Musculoskelatal Symptoms Reviewed: Weakness Musculoskeletal Management Strategies: Routine screening Musculoskeletal Self-Management Outcome: 3 (uncertain) Falls in the past year?: Yes Number of falls in past year: 2 or more Was there an injury with Fall?: No Fall Risk Category Calculator: 2 Patient Fall Risk Level: Moderate Fall Risk Patient at Risk for Falls Due to: History of fall(s), Impaired balance/gait Fall risk Follow up: Falls evaluation completed, Education provided  Psychosocial Psychosocial Symptoms Reported: No symptoms reported Behavioral Management Strategies: Support system Behavioral Health Self-Management Outcome: 4 (good)   Do you feel physically threatened by others?: No    01/07/2024    PHQ2-9 Depression Screening   Little interest or pleasure in doing things Not at all  Feeling down, depressed, or hopeless Not at all  PHQ-2 - Total Score 0  Trouble falling or staying asleep, or sleeping too much    Feeling tired or having little energy    Poor appetite or overeating     Feeling bad about yourself - or that you are a failure or have let yourself or your family down    Trouble concentrating on things, such as reading the newspaper or watching television    Moving or speaking so slowly that other people could have noticed.  Or the opposite - being so fidgety or restless that you have been moving around a lot more than usual    Thoughts that you would be better off dead, or hurting yourself in some way    PHQ2-9 Total Score    If you checked off any problems, how difficult have these problems made it for you to do your work, take care  of things at home, or get along with other people    Depression Interventions/Treatment      There were no vitals filed for this visit.  Medications Reviewed Today      Reviewed by Bertrum Rosina HERO, RN (Registered Nurse) on 01/07/24 at 1446  Med List Status: <None>   Medication Order Taking? Sig Documenting Provider Last Dose Status Informant  acetaminophen  (TYLENOL ) 500 MG tablet [id number] Yes Take 1,000 mg by mouth at bedtime as needed for mild pain (pain score 1-3) or moderate pain (pain score 4-6). [provider]  Active Self, Pharmacy Records, Spouse/Significant Other  amLODipine  (NORVASC ) 10 MG tablet [id number] Yes Take 1 tablet (10 mg total) by mouth daily. Tobie Suzzane POUR, MD  Active   apixaban  (ELIQUIS ) 5 MG TABS tablet [id number] Yes Take 5 mg by mouth in the morning and at bedtime Dezii, Alexandra, DO  Active   benzonatate  (TESSALON ) 200 MG capsule [id number] Yes Take 1 capsule (200 mg total) by mouth 2 (two) times daily as needed for cough. Tobie Suzzane POUR, MD  Active   famotidine  (PEPCID ) 40 MG tablet [id number] Yes Take 1 tablet (40 mg total) by mouth daily. Tobie Suzzane POUR, MD  Active   FLUoxetine  (PROZAC ) 20 MG capsule [id number] Yes Take 1 capsule (20 mg total) by mouth daily. Tobie Suzzane POUR, MD  Active   methylPREDNISolone  (MEDROL  DOSEPAK) 4 MG TBPK tablet [id number] Yes Take as package instructions. Tobie Suzzane POUR, MD  Active   metoprolol  tartrate (LOPRESSOR ) 25 MG tablet [id number] Yes Take 0.5 tablets (12.5 mg total) by mouth 2 (two) times daily. Tobie Suzzane POUR, MD  Active   Misc. Devices MISC [id number] Yes Blood pressure cuff/device - 1. ICD10: I10 Tobie Suzzane POUR, MD  Active   QUEtiapine  (SEROQUEL ) 100 MG tablet [id number] Yes Take 1 tablet (100 mg total) by mouth at bedtime. Tobie Suzzane POUR, MD  Active             Recommendation:   Continue Current Plan of Care  Follow Up Plan:   Telephone follow-up in 1 month  Rosina Bertrum, BSN RN [facility], [facility] RN Care Manager Direct Dial: [phone]  Fax: [phone]

## 2024-01-09 ENCOUNTER — Encounter: Payer: Self-pay | Admitting: Oncology

## 2024-01-11 ENCOUNTER — Ambulatory Visit (HOSPITAL_COMMUNITY): Admitting: Clinical

## 2024-01-11 ENCOUNTER — Encounter (HOSPITAL_COMMUNITY): Payer: Self-pay

## 2024-01-11 DIAGNOSIS — F332 Major depressive disorder, recurrent severe without psychotic features: Secondary | ICD-10-CM | POA: Diagnosis not present

## 2024-01-11 DIAGNOSIS — F411 Generalized anxiety disorder: Secondary | ICD-10-CM | POA: Diagnosis not present

## 2024-01-11 NOTE — Progress Notes (Signed)
 Virtual Visit via Video Note  I connected with [name] on 01/11/24 at  8:00 AM EDT by a video enabled telemedicine application and verified that I am speaking with the correct person using two identifiers.  Location: Patient: home  Provider: office   I discussed the limitations of evaluation and management by telemedicine and the availability of in person appointments. The patient expressed understanding and agreed to proceed.   Comprehensive Clinical Assessment (CCA) Note  01/11/2024 [name] [id number]  Chief Complaint: Depression and Anxiety  Visit Diagnosis: Severe episode of MDD without psychosis / GAD     CCA Screening, Triage and Referral (STR)  Patient Reported Information How did you hear about us ? No data recorded Referral name: No data recorded Referral phone number: No data recorded  Whom do you see for routine medical problems? No data recorded Practice/Facility Name: No data recorded Practice/Facility Phone Number: No data recorded Name of Contact: No data recorded Contact Number: No data recorded Contact Fax Number: No data recorded Prescriber Name: No data recorded Prescriber Address (if known): No data recorded  What Is the Reason for Your Visit/Call Today? No data recorded How Long Has This Been Causing You Problems? No data recorded What Do You Feel Would Help You the Most Today? No data recorded  Have You Recently Been in Any Inpatient Treatment (Hospital/Detox/Crisis Center/28-Day Program)? No data recorded Name/Location of Program/Hospital:No data recorded How Long Were You There? No data recorded When Were You Discharged? No data recorded  Have You Ever Received Services From [facility] Before? No data recorded Who Do You See at [facility]? No data recorded  Have You Recently Had Any Thoughts About Hurting Yourself? No data recorded Are You Planning to Commit Suicide/Harm Yourself At This time? No data recorded  Have you  Recently Had Thoughts About Hurting Someone [place]? No data recorded Explanation: No data recorded  Have You Used Any Alcohol or Drugs in the Past 24 Hours? No data recorded How Long Ago Did You Use Drugs or Alcohol? No data recorded What Did You Use and How Much? No data recorded  Do You Currently Have a Therapist/Psychiatrist? No data recorded Name of Therapist/Psychiatrist: No data recorded  Have You Been Recently Discharged From Any Office Practice or Programs? No data recorded Explanation of Discharge From Practice/Program: No data recorded    CCA Screening Triage Referral Assessment Type of Contact: No data recorded Is this Initial or Reassessment? No data recorded Date Telepsych consult ordered in [facility]:  No data recorded Time Telepsych consult ordered in [facility]:  No data recorded  Patient Reported Information Reviewed? No data recorded Patient Left Without Being Seen? No data recorded Reason for Not Completing Assessment: No data recorded  Collateral Involvement: No data recorded  Does Patient Have a Court Appointed Legal Guardian? No data recorded Name and Contact of Legal Guardian: No data recorded If Minor and Not Living with Parent(s), Who has Custody? No data recorded Is CPS involved or ever been involved? No data recorded Is APS involved or ever been involved? No data recorded  Patient Determined To Be At Risk for Harm To Self or Others Based on Review of Patient Reported Information or Presenting Complaint? No data recorded Method: No data recorded Availability of Means: No data recorded Intent: No data recorded Notification Required: No data recorded Additional Information for Danger to Others Potential: No data recorded Additional Comments for Danger to Others Potential: No data recorded Are There Guns or Other Weapons  in Your Home? No data recorded Types of Guns/Weapons: No data recorded Are These Weapons Safely Secured?                            No data  recorded Who Could Verify You Are Able To Have These Secured: No data recorded Do You Have any Outstanding Charges, Pending Court Dates, Parole/Probation? No data recorded Contacted To Inform of Risk of Harm To Self or Others: No data recorded  Location of Assessment: No data recorded  Does Patient Present under Involuntary Commitment? No data recorded IVC Papers Initial File Date: No data recorded  [place] of Residence: No data recorded  Patient Currently Receiving the Following Services: No data recorded  Determination of Need: No data recorded  Options For Referral: No data recorded    CCA Biopsychosocial Intake/Chief Complaint:  The patient was referred by his PCP Dr. Tobie for further evaluation for [facility] treatment services with difficulty indication dx of Depression and Anxiety  Current Symptoms/Problems: Difficulty with low mood/ mood management and Anxiety, tension , and worrying.   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 Individual Therapy   Initial Clinical Notes/Concerns: The patient notes not having any counseling involvement over the course of the past year. The patient is prescribed medication through his PCP for Depression and Anxiety. The patient notes no recent hospitalizations for MH . No current S/I or H/I   Mental Health Symptoms Depression:  Change in energy/activity; Difficulty Concentrating; Fatigue; Increase/decrease in appetite; Irritability; Weight gain/loss; Hopelessness   Duration of Depressive symptoms: Greater than two weeks   Mania:  None   Anxiety:   Tension;  Restlessness; Irritability; Fatigue; Difficulty concentrating; Worrying   Psychosis:  None   Duration of Psychotic symptoms: None  Trauma:  None   Obsessions:  None  Compulsions:  None   Inattention:  None   Hyperactivity/Impulsivity:  None   Oppositional/Defiant Behaviors:  None   Emotional Irregularity:  None   Other Mood/Personality Symptoms:  Difficulty controlling low mood episodes    Mental Status Exam Appearance and self-care  Stature:  Average   Weight:  Average weight   Clothing:  Normal/ Casual  Grooming:  Normal   Cosmetic use:  None   Posture/gait:  Normal   Motor activity:  Not Remarkable   Sensorium  Attention:  Normal   Concentration:  Anxiety interferes   Orientation:  X5   Recall/memory:  Defective in Short-term   Affect and Mood  Affect:  Appropriate   Mood:  Anxious; Depressed   Relating  Eye contact:  Normal   Facial expression:  Anxious   Attitude toward examiner:  Cooperative   Thought and Language  Speech flow: Pressured   Thought content:  Appropriate to Mood and Circumstances   Preoccupation:  None   Hallucinations:  None   Organization:  Public librarian of Knowledge:  Good   Intelligence:  Average   Abstraction:  Normal   Judgement:  Good   Reality Testing:  Realistic   Insight:  Good   Decision Making:  Normal  Social Functioning  Social Maturity:  Isolates   Social Judgement:  Normal   Stress  Stressors:  Family conflict; Housing; Illness; Relationship; Transitions (The patient notes just recovering from Chemotherapy and Radition for Cancer and removal of a feeding tube.)   Coping Ability:  Normal   Skill Deficits:  None   Supports:  Family     Religion: Religion/Spirituality Are You A Religious Person?: Yes What is Your Religious Affiliation?: Baptist How Might This Affect Treatment?: Protective Factor  Leisure/Recreation: Leisure / Recreation Do You Have Hobbies?:  No  Exercise/Diet: Exercise/Diet Do You Exercise?: No Have You Gained or Lost A Significant Amount of Weight in the Past Six Months?: Yes-Lost Number of Pounds Lost?: 50 (Patient has been going through Cancer treatment) Do You Follow a Special Diet?: No Do You Have Any Trouble Sleeping?: No   CCA Employment/Education Employment/Work Situation: Employment / Work Situation Employment Situation: On disability Why is Patient on Disability: The patient on disability for physical and mental health How Long has Patient Been on Disability: The patient has been on disability for past 2 yeats Patient's Job has Been Impacted by Current Illness: Yes What is the Longest Time Patient has Held a Job?: see above  Where was the Patient Employed at that Time?: see above.  Has Patient ever Been in the Military?: No  Education: Education Is Patient Currently Attending School?: No Last Grade Completed: 10 Name of High School: [employer] in [place] Did [place] Graduate From [employer]?: No Did Theme park manager?: No Did Designer, television/film set?: No Did You Have An Individualized Education Program (IIEP): No Did You Have Any Difficulty At School?: No Patient's Education Has Been Impacted by Current Illness: No   CCA Family/Childhood History Family and Relationship History: Family history Marital status: Separated Separated, when?: The patient notes formally being separated for 2 months but prior to that he and his spouse stayed on different sides of the house for the past year. What types of issues is patient dealing with in the relationship?: Currently going through a separation Additional relationship information: NA Are you sexually active?: Yes What is your sexual orientation?: heterosexual Has your sexual activity been affected by drugs, alcohol, medication, or emotional stress?: no  Does patient have children?: Yes How many children?: 6 How is patient's relationship with  their children?: The patient notes having a good relationship with his 6 kids.  Childhood History:  Childhood History By whom was/is the patient raised?: Both parents Additional childhood history information: I had the best childhood I could ever ask for. Mother and father were married and are still married today Description of patient's relationship with caregiver when they were a child: Good Patient's description of current relationship with people who raised him/her: The patient notes both parents have passed How were you disciplined when you got in trouble as a child/adolescent?: Grounding Does patient have siblings?: Yes Number of Siblings: 12 Description of patient's current relationship with siblings: 9 boys and 3 girls . The patient notes he gets along well with the siblings he has that are still living. Did patient suffer any verbal/emotional/physical/sexual abuse as a child?: No Did patient suffer from severe childhood neglect?: No Has patient ever been sexually abused/assaulted/raped as an adolescent or adult?: No Was the patient ever a victim of a crime or a disaster?: No Witnessed domestic violence?: No Has patient been affected by domestic violence as an adult?: No  Child/Adolescent Assessment:     CCA Substance  Use Alcohol/Drug Use: Alcohol / Drug Use Pain Medications: See MAR Prescriptions: See MAR Over the Counter: None History of alcohol / drug use?: Yes Longest period of sobriety (when/how long): 4 years Negative Consequences of Use: Personal relationships                         ASAM's:  Six Dimensions of Multidimensional Assessment  Dimension 1:  Acute Intoxication and/or Withdrawal Potential:      Dimension 2:  Biomedical Conditions and Complications:      Dimension 3:  Emotional, Behavioral, or Cognitive Conditions and Complications:     Dimension 4:  Readiness to Change:     Dimension 5:  Relapse, Continued use, or Continued Problem  Potential:     Dimension 6:  Recovery/Living Environment:     ASAM Severity Score:    ASAM Recommended Level of Treatment:     Substance use Disorder (SUD)    Recommendations for Services/Supports/Treatments: Recommendations for Services/Supports/Treatments Recommendations For Services/Supports/Treatments: Medication Management, Individual Therapy  DSM5 Diagnoses: Patient Active Problem List   Diagnosis Date Noted   Acute bronchitis 12/16/2023   Prostate cancer screening 12/16/2023   Prediabetes 12/16/2023   Housing insecurity 11/11/2023   PEG (percutaneous endoscopic gastrostomy) status (HCC) 08/04/2023   HCAP (healthcare-associated pneumonia) 06/14/2023   Internal jugular (IJ) vein thromboembolism, acute, right (/Acute DVT of Right IJV- acute occlusive thrombus throughout the right internal jugular vein. 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   Left ear pain 03/10/2023   Oropharyngeal dysphagia 03/10/2023   Essential hypertension 02/04/2023   GAD (generalized anxiety disorder) 02/04/2023   Cocaine use disorder, severe, in sustained remission (HCC)    Major depressive disorder, recurrent episode, severe, 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   Chronic hepatitis C virus infection (HCC) G1a 12/28/2016   GERD (gastroesophageal reflux disease) 12/10/2016   Encounter for antineoplastic chemotherapy 12/10/2016   Alcohol use disorder, severe, dependence in unclear remission status     Patient Centered Plan: Patient is on the following Treatment Plan(s):  Severe Episode of MDD without psychosis/ GAD    Referrals to  Alternative Service(s): Referred to Alternative Service(s):   Place:   Date:   Time:    Referred to Alternative Service(s):   Place:   Date:   Time:    Referred to Alternative Service(s):   Place:   Date:   Time:    Referred to Alternative Service(s):   Place:   Date:   Time:      Collaboration of Care: No additional Collaboration for this session.  Patient/Guardian was advised Release of Information must be obtained prior to any record release in order to collaborate their care with an outside provider. Patient/Guardian was advised if they have not already done so to contact the registration department to sign all necessary forms in order for us  to release information regarding their care.   Consent: Patient/Guardian gives verbal consent for treatment and assignment of benefits for services provided during this visit. Patient/Guardian expressed understanding and agreed to proceed.     I discussed the assessment and treatment plan with the patient. The patient was provided an opportunity to  ask questions and all were answered. The patient agreed with the plan and demonstrated an understanding of the instructions.   The patient was advised to call back or seek an in-person evaluation if the symptoms worsen or if the condition fails to improve as anticipated.  I provided 45 minutes of non-face-to-face time during this encounter.  Jerel ONEIDA Pepper, LCSW  01/11/2024

## 2024-01-12 ENCOUNTER — Other Ambulatory Visit: Payer: Self-pay | Admitting: Licensed Clinical Social Worker

## 2024-01-13 NOTE — Patient Instructions (Signed)
 Visit Information  Thank you for taking time to visit with me today. Please don't hesitate to contact me if I can be of assistance to you before our next scheduled appointment.  Your next care management appointment is by telephone on 02/03/24 at 1:15 pm  Telephone follow-up in 1 month  Please call the care guide team at [phone] if you need to cancel, schedule, or reschedule an appointment.   Please call the Suicide and Crisis Lifeline: 988 call the [place]  National Suicide Prevention Lifeline: 539 653 5229 or TTY: (936)342-7211 TTY 720-497-4629) to talk to a trained counselor call 1-800-273-TALK (toll free, 24 hour hotline) go to [facility] [address] [place] [phone]) call the [facility] Crisis Line: 762-657-3021 call 911 if you are experiencing a Mental Health or Behavioral Health Crisis or need someone to talk to.  Lyle Rung, BSW, MSW, LCSW Licensed Clinical Social Worker [employer]   [facility] [email or web address] Direct Dial: [phone]

## 2024-01-13 NOTE — Patient Outreach (Addendum)
 Complex Care Management   Visit Note  01/12/2024  Name:  [name] MRN: [id number] DOB: [birth year]  Situation: Referral received for Complex Care Management related to Mental/Behavioral Health diagnosis depression. I obtained verbal consent from Patient.  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   Substance induced mood disorder (HCC) 11/05/2016   Syncope 05/21/2023    Assessment: Patient Reported Symptoms:  Cognitive Cognitive Status: No symptoms reported Cognitive/Intellectual Conditions Management [RPT]: Other Other: Memory Deficits   Health Maintenance Behaviors: Annual physical exam Healing Pattern: Average Health Facilitated by: Rest  Neurological Neurological Review of Symptoms: No symptoms reported Neurological Management Strategies: Counseling, Coping strategies, Routine screening Neurological Self-Management Outcome: 4 (good)  HEENT HEENT Symptoms Reported: Sore throat HEENT Management Strategies: Routine screening HEENT Self-Management Outcome: 3 (uncertain)    Cardiovascular Cardiovascular Symptoms Reported: No symptoms reported Does patient have uncontrolled Hypertension?: No Cardiovascular Management Strategies: Medication therapy, Routine screening Cardiovascular Self-Management Outcome: 4 (good)  Respiratory Respiratory Symptoms Reported: No symptoms reported Respiratory Management Strategies: Routine screening Respiratory Self-Management  Outcome: 4 (good)  Endocrine Endocrine Symptoms Reported: No symptoms reported Is patient diabetic?: No Endocrine Self-Management Outcome: 4 (good)  Musculoskeletal Musculoskelatal Symptoms Reviewed: Weakness Musculoskeletal Management Strategies: Routine screening Musculoskeletal Self-Management Outcome: 3 (uncertain)      Psychosocial Psychosocial Symptoms Reported: Sadness - if selected complete PHQ 2-9 Additional Psychological Details: Therapy started yesterday on 01/11/24 Behavioral Management Strategies: Counseling, [employer], Personnel officer Health Self-Management Outcome: 4 (good) Major Change/Loss/Stressor/Fears (CP): Medical condition, self Techniques to [employer] with Loss/Stress/Change: Medication Quality of Family Relationships: stressful Do you feel physically threatened by others?: No    01/13/2024    PHQ2-9 Depression Screening   Little interest or pleasure in doing things More than half the days  Feeling down, depressed, or hopeless More than half the days  PHQ-2 - Total Score 4  Trouble falling or staying asleep, or sleeping too much Not at all  Feeling tired or having little energy Several days  Poor appetite or overeating  Several days  Feeling bad about yourself - or that you are a failure or have let yourself or your family down Several days  Trouble concentrating on things, such as reading the newspaper or watching television Several days  Moving or speaking so slowly that other people could have noticed.  Or the opposite - being so fidgety or restless that you have been moving around a lot more than usual Not at all  Thoughts that you would be better off dead, or hurting yourself in some way Not at all  PHQ2-9 Total Score 8  If you checked off any problems, how difficult have these problems made it for you to do your work, take care of things at home, or get along with other people Somewhat difficult  Depression Interventions/Treatment  Medication, Counseling    There were no  vitals filed for this visit.  Medications Reviewed Today     Reviewed by Merlynn Lyle CROME, LCSW (Social Worker) on 01/13/24 at 1219  Med List Status: <None>   Medication Order Taking? Sig Documenting Provider Last Dose Status Informant  acetaminophen  (TYLENOL ) 500 MG tablet [id number]  Take 1,000 mg by mouth at bedtime as needed for mild pain (pain score 1-3) or moderate pain (pain score 4-6). [provider]  Active Self, Pharmacy Records, Spouse/Significant Other  amLODipine  (NORVASC ) 10 MG tablet [id number]  Take 1 tablet (10 mg total) by mouth daily. Tobie Suzzane POUR, MD  Active   apixaban  (ELIQUIS ) 5 MG TABS tablet [id number]  Take 5 mg by mouth in the morning and at bedtime Dezii, Alexandra, DO  Active   benzonatate  (TESSALON ) 200 MG capsule [id number]  Take 1 capsule (200 mg total) by mouth 2 (two) times daily as needed for cough. Tobie Suzzane POUR, MD  Active   famotidine  (PEPCID ) 40 MG tablet [id number]  Take 1 tablet (40 mg total) by mouth daily. Tobie Suzzane POUR, MD  Active   FLUoxetine  (PROZAC ) 20 MG capsule [id number]  Take 1 capsule (20 mg total) by mouth daily. Tobie Suzzane POUR, MD  Active   methylPREDNISolone  (MEDROL  DOSEPAK) 4 MG TBPK tablet [id number]  Take as package instructions. Tobie Suzzane POUR, MD  Active   metoprolol  tartrate (LOPRESSOR ) 25 MG tablet [id number]  Take 0.5 tablets (12.5 mg total) by mouth 2 (two) times daily. Tobie Suzzane POUR, MD  Active   Misc. Devices MISC [id number]  Blood pressure cuff/device - 1. ICD10: I10 Tobie Suzzane POUR, MD  Active   QUEtiapine  (SEROQUEL ) 100 MG tablet [id number]  Take 1 tablet (100 mg total) by mouth at bedtime. Tobie Suzzane POUR, MD  Active            SDOH Interventions    Flowsheet Row Patient Outreach Telephone from 01/12/2024 in [place] POPULATION HEALTH DEPARTMENT Patient Outreach Telephone from 12/13/2023 in [place] POPULATION HEALTH DEPARTMENT Patient Outreach Telephone from  12/08/2023 in [place] POPULATION HEALTH DEPARTMENT Patient Outreach Telephone from 11/26/2023 in [place] POPULATION HEALTH DEPARTMENT Patient Outreach Telephone from 11/25/2023 in [place] POPULATION HEALTH DEPARTMENT Patient Outreach Telephone from 11/22/2023 in [place] POPULATION HEALTH DEPARTMENT  SDOH Interventions        Food Insecurity Interventions -- -- -- Intervention Not Indicated  [Assisted at food banks] Community Resources Provided [employer] Referral, AMB Referral, [employer] Provided  Housing Interventions -- -- -- -- Other (Comment)  [Working with SW] AMB Referral, Programmer, applications Provided, IT sales professional Interventions -- -- -- -- Patient Resources Dietitian) --  Utilities Interventions -- -- -- -- Intervention Not Indicated --  Depression Interventions/Treatment  Medication, Counseling -- Medication -- Medication Medication, Radiation protection practitioner Strain Interventions -- -- -- -- -- Programmer, applications Provided  Stress Interventions -- Programmer, applications Provided -- -- -- Programmer, applications Provided, Provide Counseling   Recommendation:   PCP Follow-up Continue Current Plan of Care  Follow Up Plan:   Telephone follow-up in 1 month  Lyle Merlynn, BSW, MSW, [employer] Licensed Visual merchandiser [employer]   [email or web address] Direct Dial: [phone]

## 2024-01-28 ENCOUNTER — Telehealth: Payer: Self-pay | Admitting: Licensed Clinical Social Worker

## 2024-01-28 ENCOUNTER — Encounter: Payer: Self-pay | Admitting: Licensed Clinical Social Worker

## 2024-01-31 NOTE — Patient Instructions (Signed)
 [name] CROME Ernsberger - I am sorry I was unable to reach you today for our scheduled appointment. I work with Tobie, Suzzane POUR, MD and am calling to support your healthcare needs. Please contact me at [phone] at your earliest convenience. I look forward to speaking with you soon.   Thank you,  Lyle Rung, BSW, MSW, LCSW Licensed Clinical Social Worker [employer]   [facility] [email or web address] Direct Dial: [phone]

## 2024-02-03 ENCOUNTER — Encounter: Payer: Self-pay | Admitting: Licensed Clinical Social Worker

## 2024-02-03 ENCOUNTER — Telehealth: Payer: Self-pay | Admitting: Licensed Clinical Social Worker

## 2024-02-03 NOTE — Patient Instructions (Signed)
 [name] CROME [place]w - I am sorry I was unable to reach you today for our scheduled appointment. I work with Tobie, Suzzane POUR, MD and am calling to support your healthcare needs. Please contact me at [phone] at your earliest convenience. I look forward to speaking with you soon.   Thank you,  Lyle Rung, BSW, MSW, LCSW Licensed Clinical Social Worker [employer]   [facility] [email or web address] Direct Dial: [phone]

## 2024-02-07 ENCOUNTER — Other Ambulatory Visit: Payer: Self-pay | Admitting: *Deleted

## 2024-02-07 NOTE — Patient Instructions (Signed)
 Visit Information  Thank you for taking time to visit with me today. Please don't hesitate to contact me if I can be of assistance to you before our next scheduled appointment.  Your next care management appointment is by telephone on 03-08-2024 at 1:00 pm  Telephone follow-up in 1 month  Please call the care guide team at [phone] if you need to cancel, schedule, or reschedule an appointment.   Please call the Suicide and Crisis Lifeline: 988 call the [place]  National Suicide Prevention Lifeline: 931-282-8045 or TTY: 601-791-9306 TTY 763-875-6333) to talk to a trained counselor call 1-800-273-TALK (toll free, 24 hour hotline) if you are experiencing a Mental Health or Behavioral Health Crisis or need someone to talk to.  Rosina Forte, BSN RN [facility], [facility] RN Care Manager Direct Dial: [phone]  Fax: [phone]

## 2024-02-07 NOTE — Patient Outreach (Signed)
 Complex Care Management   Visit Note  02/07/2024  Name:  [name] MRN: [id number] DOB: 29-Jul-1970  Situation: Referral received for Complex Care Management related to HTN and Care coordination I obtained verbal consent from Patient.  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   Substance induced mood disorder (HCC) 11/05/2016   Syncope 05/21/2023    Assessment: Patient Reported Symptoms:  Cognitive Cognitive Status: No symptoms reported Cognitive/Intellectual Conditions Management [RPT]: None reported or documented in medical history or problem list   Health Maintenance Behaviors: Annual physical exam Healing Pattern: Average Health Facilitated by: Rest  Neurological Neurological Review of Symptoms: No symptoms reported Neurological Self-Management Outcome: 4 (good)  HEENT HEENT Symptoms Reported: Sore throat HEENT Management Strategies: Routine screening HEENT Self-Management Outcome: 3 (uncertain) HEENT Comment: feels like throat is raw - difficulty talking. ENT appointment rescheduled for 03/02/24 at 330 with Dr. Karis    Cardiovascular Cardiovascular Symptoms Reported: No symptoms reported Cardiovascular Management Strategies: Routine screening Cardiovascular Self-Management Outcome: 4 (good)  Respiratory Respiratory Symptoms Reported: Dry cough Respiratory Management Strategies: Routine screening Respiratory Self-Management Outcome: 3 (uncertain)   Endocrine Endocrine Symptoms Reported: No symptoms reported Is patient diabetic?: No Endocrine Self-Management Outcome: 4 (good)  Gastrointestinal Gastrointestinal Symptoms Reported: No symptoms reported Gastrointestinal Self-Management Outcome: 4 (good)    Genitourinary Genitourinary Symptoms Reported: No symptoms reported Genitourinary Self-Management Outcome: 4 (good)  Integumentary Integumentary Symptoms Reported: No symptoms reported Skin Self-Management Outcome: 4 (good)  Musculoskeletal Musculoskelatal Symptoms Reviewed: Weakness Musculoskeletal Management Strategies: Routine screening Musculoskeletal Self-Management Outcome: 4 (good) Falls in the past year?: Yes Number of falls in past year: 2 or more Was there an injury with Fall?: No Fall Risk Category Calculator: 2 Patient Fall Risk Level: Moderate Fall Risk Patient at Risk for Falls Due to: History of fall(s), Impaired balance/gait Fall risk Follow up: Falls evaluation completed, Education provided  Psychosocial Psychosocial Symptoms Reported: No symptoms reported Behavioral Management Strategies: Coping strategies Behavioral Health Self-Management Outcome: 4 (good) Major Change/Loss/Stressor/Fears (CP): Medical condition, self Techniques to Cope with Loss/Stress/Change: Medication      02/07/2024    PHQ2-9 Depression Screening   Little interest or pleasure in doing things Not at all  Feeling down, depressed, or hopeless Not at all  PHQ-2 - Total Score 0  Trouble falling or staying asleep, or sleeping too much    Feeling tired or having little energy    Poor appetite or overeating     Feeling bad about yourself - or that you are a failure or have let yourself or your family down    Trouble concentrating on things, such as reading the newspaper or watching television    Moving or speaking so slowly that other people could have noticed.  Or the opposite - being so fidgety or restless that you have been moving around  a lot more than usual  Thoughts that you would be better off dead, or hurting yourself in some way    PHQ2-9 Total Score    If you checked off any problems, how difficult have these problems made it for you to do your work, take care of things at home, or get along with other people    Depression Interventions/Treatment      There were no vitals filed for this visit.  Medications Reviewed Today     Reviewed by Bertrum Rosina HERO, RN (Registered Nurse) on 02/07/24 at 1338  Med List Status: <None>   Medication Order Taking? Sig Documenting Provider Last Dose Status Informant  acetaminophen  (TYLENOL ) 500 MG tablet [id number] Yes Take 1,000 mg by mouth at bedtime as needed for mild pain (pain score 1-3) or moderate pain (pain score 4-6). [provider]  Active Self, Pharmacy Records, Spouse/Significant Other  amLODipine  (NORVASC ) 10 MG tablet [id number] Yes Take 1 tablet (10 mg total) by mouth daily. Tobie Suzzane POUR, MD  Active   apixaban  (ELIQUIS ) 5 MG TABS tablet [id number] Yes Take 5 mg by mouth in the morning and at bedtime Dezii, Alexandra, DO  Active   benzonatate  (TESSALON ) 200 MG capsule [id number] Yes Take 1 capsule (200 mg total) by mouth 2 (two) times daily as needed for cough. Tobie Suzzane POUR, MD  Active   famotidine  (PEPCID ) 40 MG tablet [id number] Yes Take 1 tablet (40 mg total) by mouth daily. Tobie Suzzane POUR, MD  Active   FLUoxetine  (PROZAC ) 20 MG capsule [id number] Yes Take 1 capsule (20 mg total) by mouth daily. Tobie Suzzane POUR, MD  Active   methylPREDNISolone  (MEDROL  DOSEPAK) 4 MG TBPK tablet [id number]  Take as package instructions.  Patient not taking: Reported on 02/07/2024   Tobie Suzzane POUR, MD  Consider Medication Status and Discontinue   metoprolol  tartrate (LOPRESSOR ) 25 MG tablet [id number] Yes Take 0.5 tablets (12.5 mg total) by mouth 2 (two) times daily. Tobie Suzzane POUR, MD  Active   Misc. Devices MISC [id number]  Blood pressure cuff/device - 1. ICD10: I10  Patient  not taking: Reported on 02/07/2024   Tobie Suzzane POUR, MD  Active   QUEtiapine  (SEROQUEL ) 100 MG tablet [id number] Yes Take 1 tablet (100 mg total) by mouth at bedtime. Tobie Suzzane POUR, MD  Active             Recommendation:   Continue Current Plan of Care Keep scheduled appointment with Dr. Karis, ENT scheduled for 03-02-2024 at 3:30 pm. Ensure transportation is arranged with family in advance. Obtain BP cuff from local pharmacy - Use U Card if necessary.  Follow Up Plan:   Telephone follow-up in 1 month  Rosina Bertrum, BSN RN [facility], [facility] Health RN Care Manager Direct Dial: [phone]  Fax: [phone]

## 2024-02-08 ENCOUNTER — Ambulatory Visit (HOSPITAL_COMMUNITY): Admitting: Clinical

## 2024-02-08 ENCOUNTER — Encounter (HOSPITAL_COMMUNITY): Payer: Self-pay

## 2024-02-15 ENCOUNTER — Telehealth: Payer: Self-pay | Admitting: Internal Medicine

## 2024-02-15 NOTE — Telephone Encounter (Signed)
 Copied from CRM [id number]. Topic: General - Other >> Feb 15, 2024  4:20 PM Victoria B wrote: Reason for CRM:  lilly [facility] to find out if pt hs dx of diabetes ,cardiovascular  issues, or heart failure,   memer id [phone] touse when callling back

## 2024-02-16 ENCOUNTER — Telehealth: Payer: Self-pay | Admitting: Internal Medicine

## 2024-02-16 NOTE — Telephone Encounter (Signed)
 Copied from CRM [id number]. Topic: General - Other >> Feb 15, 2024  4:20 PM Victoria B wrote: Reason for CRM:  lilly called in [facility] to find out if pt hs dx of diabetes ,cardiovascular  issues, or heart failure,   memer id [id number] touse when callling back >> Feb 16, 2024  9:01 AM Emylou G wrote: Sharolyn w/[facility] .SABRA They are calling back to check on diagnosis.SABRA [phone] ref [phone]

## 2024-02-22 ENCOUNTER — Other Ambulatory Visit: Payer: Self-pay | Admitting: Licensed Clinical Social Worker

## 2024-02-22 ENCOUNTER — Encounter: Payer: Self-pay | Admitting: Licensed Clinical Social Worker

## 2024-02-22 NOTE — Patient Instructions (Signed)
 [name] CROME [place]w - I am sorry I was unable to reach you today for our scheduled appointment. I work with Tobie, Suzzane POUR, MD and am calling to support your healthcare needs. Please contact me at [phone] at your earliest convenience. I look forward to speaking with you soon.   Thank you,  Lyle Rung, BSW, MSW, LCSW Licensed Clinical Social Worker [employer]   [facility] [email or web address] Direct Dial: [phone]

## 2024-02-28 ENCOUNTER — Ambulatory Visit (HOSPITAL_COMMUNITY): Admitting: Registered Nurse

## 2024-03-02 ENCOUNTER — Encounter (INDEPENDENT_AMBULATORY_CARE_PROVIDER_SITE_OTHER): Payer: Self-pay | Admitting: Otolaryngology

## 2024-03-02 ENCOUNTER — Ambulatory Visit (INDEPENDENT_AMBULATORY_CARE_PROVIDER_SITE_OTHER): Admitting: Otolaryngology

## 2024-03-02 VITALS — BP 147/84 | HR 69 | Temp 98.4°F | Ht 64.0 in | Wt 120.0 lb

## 2024-03-02 DIAGNOSIS — C321 Malignant neoplasm of supraglottis: Secondary | ICD-10-CM

## 2024-03-02 DIAGNOSIS — Z8521 Personal history of malignant neoplasm of larynx: Secondary | ICD-10-CM | POA: Diagnosis not present

## 2024-03-02 DIAGNOSIS — H9202 Otalgia, left ear: Secondary | ICD-10-CM | POA: Diagnosis not present

## 2024-03-02 NOTE — Progress Notes (Unsigned)
 Patient ID: [name], male   DOB: 03-21-1971, 53 y.o.   MRN: [id number]  Follow up: Left otalgia, laryngeal SCCA  History of Present Illness [name] is a 53 year old male who presents for a follow-up of his left otalgia and throat cancer.  The patient has a history of a left supraglottic squamous cell carcinoma.  He completed chemoradiation therapy in March 2025 and his posttreatment PET scan in June 2025 showed no residual tumor.  He is able to eat and drink without difficulty. He is still experiencing some left ear pain, although it is not as severe as when he had cancer. Initially, he thought the ear pain was due to an ear infection, but it was related to his throat cancer.  He is scheduled to see his oncology doctor later this month.  Exam: General: Communicates without difficulty, well nourished, no acute distress. Head: Normocephalic, no evidence injury, no tenderness, facial buttresses intact without stepoff. Face/sinus: No tenderness to palpation and percussion. Facial movement is normal and symmetric. Eyes: PERRL, EOMI. No scleral icterus, conjunctivae clear. Neuro: CN II exam reveals vision grossly intact.  No nystagmus at any point of gaze. Ears: Auricles well formed without lesions.  Ear canals are intact without mass or lesion.  No erythema or edema is appreciated.  The TMs are intact without fluid. Nose: External evaluation reveals normal support and skin without lesions.  Dorsum is intact.  Anterior rhinoscopy reveals congested mucosa over anterior aspect of inferior turbinates and intact septum.  No purulence noted. Oral:  Oral cavity and oropharynx are intact, symmetric, without erythema or edema.  Mucosa is moist without lesions. Neck: Full range of motion without pain. Thyroid  bed within normal limits to palpation.  Parotid glands and submandibular glands equal bilaterally without mass.  Trachea is midline. Neuro:  CN 2-12 grossly intact.   Procedure:  Flexible  Fiberoptic Laryngoscopy Anesthesia: None Description: Risks, benefits, and alternatives of flexible endoscopy were explained to the patient. Specific mention was made of the risk of throat numbness with difficulty swallowing, possible bleeding from the nose and mouth, and pain from the procedure.  The patient gave oral consent to proceed.  The flexible scope was inserted into the right nasal cavity and advanced towards the nasopharynx.  Visualized mucosa over the turbinates and septum were normal.  The nasopharynx was clear.  Oropharyngeal walls were symmetric and mobile without lesion, mass, or edema.  Hypopharynx was also without  lesion or edema.  Arytenoid mucosa was mildly edematous.  True vocal folds were pale yellow, mobile, and without mass or lesion.     Assessment & Plan History of left supraglottic laryngeal squamous cell carcinoma, post-treatment surveillance Completed chemoradiation for laryngeal cancer in March 2025, with clearance in June 2025. No evidence of recurrence on examination.  - Continue surveillance with throat examination every six months for five years, then annually.  Radiation-induced changes of the throat Radiation-induced changes may cause delayed effects such as scarring and hoarseness. No current evidence of tumor or growth. - Continue to monitor for delayed radiation effects during routine follow-up visits.  Referred left otalgia Mild left ear pain persists but is less severe than during cancer treatment.  - Continue to monitor ear pain during routine follow-up visits.

## 2024-03-03 ENCOUNTER — Other Ambulatory Visit: Payer: Self-pay

## 2024-03-14 ENCOUNTER — Ambulatory Visit: Admitting: Internal Medicine

## 2024-03-16 ENCOUNTER — Encounter: Payer: Self-pay | Admitting: Oncology

## 2024-03-16 LAB — CBC WITH DIFFERENTIAL/PLATELET
Basophils Absolute: 0 x10E3/uL (ref 0.0–0.2)
Basos: 1 %
EOS (ABSOLUTE): 0.2 x10E3/uL (ref 0.0–0.4)
Eos: 4 %
Hematocrit: 40.4 % (ref 37.5–51.0)
Hemoglobin: 13.3 g/dL (ref 13.0–17.7)
Immature Grans (Abs): 0 x10E3/uL (ref 0.0–0.1)
Immature Granulocytes: 0 %
Lymphocytes Absolute: 1.1 x10E3/uL (ref 0.7–3.1)
Lymphs: 26 %
MCH: 30.8 pg (ref 26.6–33.0)
MCHC: 32.9 g/dL (ref 31.5–35.7)
MCV: 94 fL (ref 79–97)
Monocytes Absolute: 0.7 x10E3/uL (ref 0.1–0.9)
Monocytes: 16 %
Neutrophils Absolute: 2.2 x10E3/uL (ref 1.4–7.0)
Neutrophils: 53 %
Platelets: 207 x10E3/uL (ref 150–450)
RBC: 4.32 x10E6/uL (ref 4.14–5.80)
RDW: 13.5 % (ref 11.6–15.4)
WBC: 4.2 x10E3/uL (ref 3.4–10.8)

## 2024-03-16 LAB — CMP14+EGFR
ALT: 10 IU/L (ref 0–44)
AST: 17 IU/L (ref 0–40)
Albumin: 4.1 g/dL (ref 3.8–4.9)
Alkaline Phosphatase: 69 IU/L (ref 47–123)
BUN/Creatinine Ratio: 12 (ref 9–20)
BUN: 13 mg/dL (ref 6–24)
Bilirubin Total: 0.3 mg/dL (ref 0.0–1.2)
CO2: 22 mmol/L (ref 20–29)
Calcium: 9.3 mg/dL (ref 8.7–10.2)
Chloride: 104 mmol/L (ref 96–106)
Creatinine, Ser: 1.08 mg/dL (ref 0.76–1.27)
Globulin, Total: 2.9 g/dL (ref 1.5–4.5)
Glucose: 85 mg/dL (ref 70–99)
Potassium: 4.2 mmol/L (ref 3.5–5.2)
Sodium: 140 mmol/L (ref 134–144)
Total Protein: 7 g/dL (ref 6.0–8.5)
eGFR: 81 mL/min/1.73 (ref >59)

## 2024-03-16 LAB — LIPID PANEL
Chol/HDL Ratio: 3.4 ratio (ref 0.0–5.0)
Cholesterol, Total: 149 mg/dL (ref 100–199)
HDL: 44 mg/dL (ref >39)
LDL Chol Calc (NIH): 66 mg/dL (ref 0–99)
Triglycerides: 241 mg/dL — ABNORMAL HIGH (ref 0–149)
VLDL Cholesterol Cal: 39 mg/dL (ref 5–40)

## 2024-03-16 LAB — TSH: TSH: 1.74 u[IU]/mL (ref 0.450–4.500)

## 2024-03-16 LAB — HEMOGLOBIN A1C
Est. average glucose Bld gHb Est-mCnc: 105 mg/dL
Hgb A1c MFr Bld: 5.3 % (ref 4.8–5.6)

## 2024-03-16 LAB — PSA: Prostate Specific Ag, Serum: 0.3 ng/mL (ref 0.0–4.0)

## 2024-03-16 LAB — VITAMIN D 25 HYDROXY (VIT D DEFICIENCY, FRACTURES): Vit D, 25-Hydroxy: 14.8 ng/mL — ABNORMAL LOW (ref 30.0–100.0)

## 2024-03-17 ENCOUNTER — Ambulatory Visit: Admitting: Internal Medicine

## 2024-03-17 ENCOUNTER — Encounter: Payer: Self-pay | Admitting: Internal Medicine

## 2024-03-17 ENCOUNTER — Encounter: Payer: Self-pay | Admitting: Oncology

## 2024-03-17 VITALS — BP 136/85 | HR 78 | Ht 64.0 in | Wt 117.8 lb

## 2024-03-17 DIAGNOSIS — E559 Vitamin D deficiency, unspecified: Secondary | ICD-10-CM | POA: Diagnosis not present

## 2024-03-17 DIAGNOSIS — I10 Essential (primary) hypertension: Secondary | ICD-10-CM

## 2024-03-17 DIAGNOSIS — Z23 Encounter for immunization: Secondary | ICD-10-CM

## 2024-03-17 DIAGNOSIS — F333 Major depressive disorder, recurrent, severe with psychotic symptoms: Secondary | ICD-10-CM

## 2024-03-17 DIAGNOSIS — R630 Anorexia: Secondary | ICD-10-CM | POA: Insufficient documentation

## 2024-03-17 DIAGNOSIS — F411 Generalized anxiety disorder: Secondary | ICD-10-CM | POA: Diagnosis not present

## 2024-03-17 DIAGNOSIS — I82C11 Acute embolism and thrombosis of right internal jugular vein: Secondary | ICD-10-CM | POA: Diagnosis not present

## 2024-03-17 DIAGNOSIS — K219 Gastro-esophageal reflux disease without esophagitis: Secondary | ICD-10-CM | POA: Diagnosis not present

## 2024-03-17 MED ORDER — MIRTAZAPINE 7.5 MG PO TABS: 7.5000 mg | ORAL_TABLET | Freq: Every day | ORAL | 1 refills | Status: AC

## 2024-03-17 MED ORDER — VITAMIN D (ERGOCALCIFEROL) 1.25 MG (50000 UNIT) PO CAPS: 50000.0000 [IU] | ORAL_CAPSULE | ORAL | 1 refills | Status: AC

## 2024-03-17 NOTE — Assessment & Plan Note (Signed)
 BP Readings from Last 1 Encounters:  03/17/24 136/85   Usually well-controlled with amlodipine  10 mg QD and metoprolol  12.5 mg BID - refilled Counseled for compliance with the medications Advised DASH diet and moderate exercise/walking, at least 150 mins/week

## 2024-03-17 NOTE — Patient Instructions (Addendum)
 Schedule your Medicare Annual Wellness Visit at checkout.  Please start taking Mirtazepine at bedtime to improve appetite and sleep.  Please start taking Vitamin D  50,000 IU once weekly.  Please continue to take medications as prescribed.  Please continue to follow low salt diet and ambulate as tolerated.  Please call Radiology to schedule your Ultrasound of neck.

## 2024-03-17 NOTE — Assessment & Plan Note (Signed)
 Usually well-controlled Continue Pepcid  40 mg QD

## 2024-03-17 NOTE — Assessment & Plan Note (Signed)
 Since chemoradiation treatment MDD also contributing to lack of appetite Added Remeron  to improve appetite Advised to eat at regular intervals and take protein supplements

## 2024-03-17 NOTE — Assessment & Plan Note (Signed)
 Last vitamin D  Lab Results  Component Value Date   VD25OH 14.8 (L) 03/15/2024   Started vitamin D  50,000 IU QW
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
## 2024-03-17 NOTE — Assessment & Plan Note (Addendum)
 Better controlled since restarting his medications On Prozac  20 mg QD Refilled hydroxyzine  25 mg 3 times daily as needed Referred to psychiatry [facility]

## 2024-03-17 NOTE — Assessment & Plan Note (Addendum)
 Better controlled now Had started Prozac  20 mg once daily, advised to stay compliant Refilled Seroquel  100 mg nightly as he was responding well to it Added Remeron  7.5 mg qHS for insomnia and to improve appetite Follow-up with [facility] Check CMP, TSH - wnl

## 2024-03-17 NOTE — Assessment & Plan Note (Signed)
 Was found in 02/25 On Eliquis  5 mg BID Followed by Heme/Onc. Needs to get US  of neck done to reevaluate IJ vein thrombosis

## 2024-04-01 ENCOUNTER — Encounter: Payer: Self-pay | Admitting: Oncology

## 2024-04-06 ENCOUNTER — Other Ambulatory Visit: Payer: Self-pay

## 2024-04-06 DIAGNOSIS — C321 Malignant neoplasm of supraglottis: Secondary | ICD-10-CM

## 2024-04-06 DIAGNOSIS — D6959 Other secondary thrombocytopenia: Secondary | ICD-10-CM

## 2024-04-06 DIAGNOSIS — Z5111 Encounter for antineoplastic chemotherapy: Secondary | ICD-10-CM

## 2024-04-06 DIAGNOSIS — I8290 Acute embolism and thrombosis of unspecified vein: Secondary | ICD-10-CM

## 2024-04-06 DIAGNOSIS — I82C11 Acute embolism and thrombosis of right internal jugular vein: Secondary | ICD-10-CM

## 2024-04-07 ENCOUNTER — Inpatient Hospital Stay: Payer: Medicare (Managed Care) | Attending: Oncology

## 2024-04-07 ENCOUNTER — Inpatient Hospital Stay: Payer: Medicare (Managed Care) | Admitting: Oncology

## 2024-04-07 VITALS — BP 134/74 | HR 52 | Temp 98.3°F | Resp 17 | Wt 119.0 lb

## 2024-04-07 DIAGNOSIS — R630 Anorexia: Secondary | ICD-10-CM | POA: Diagnosis not present

## 2024-04-07 DIAGNOSIS — C321 Malignant neoplasm of supraglottis: Secondary | ICD-10-CM

## 2024-04-07 DIAGNOSIS — I82C11 Acute embolism and thrombosis of right internal jugular vein: Secondary | ICD-10-CM | POA: Diagnosis not present

## 2024-04-07 DIAGNOSIS — D6959 Other secondary thrombocytopenia: Secondary | ICD-10-CM

## 2024-04-07 DIAGNOSIS — Z9221 Personal history of antineoplastic chemotherapy: Secondary | ICD-10-CM | POA: Diagnosis not present

## 2024-04-07 DIAGNOSIS — Z923 Personal history of irradiation: Secondary | ICD-10-CM | POA: Diagnosis not present

## 2024-04-07 DIAGNOSIS — I8290 Acute embolism and thrombosis of unspecified vein: Secondary | ICD-10-CM

## 2024-04-07 DIAGNOSIS — Z5111 Encounter for antineoplastic chemotherapy: Secondary | ICD-10-CM

## 2024-04-07 DIAGNOSIS — Z87891 Personal history of nicotine dependence: Secondary | ICD-10-CM | POA: Diagnosis not present

## 2024-04-07 DIAGNOSIS — Z7901 Long term (current) use of anticoagulants: Secondary | ICD-10-CM | POA: Insufficient documentation

## 2024-04-07 LAB — CMP (CANCER CENTER ONLY)
ALT: 8 U/L (ref 0–44)
AST: 18 U/L (ref 15–41)
Albumin: 4 g/dL (ref 3.5–5.0)
Alkaline Phosphatase: 60 U/L (ref 38–126)
Anion gap: 10 (ref 5–15)
BUN: 9 mg/dL (ref 6–20)
CO2: 26 mmol/L (ref 22–32)
Calcium: 9.2 mg/dL (ref 8.9–10.3)
Chloride: 105 mmol/L (ref 98–111)
Creatinine: 0.95 mg/dL (ref 0.61–1.24)
GFR, Estimated: >60 mL/min
Glucose, Bld: 89 mg/dL (ref 70–99)
Potassium: 4.2 mmol/L (ref 3.5–5.1)
Sodium: 141 mmol/L (ref 135–145)
Total Bilirubin: 0.7 mg/dL (ref 0.0–1.2)
Total Protein: 7.4 g/dL (ref 6.5–8.1)

## 2024-04-07 LAB — CBC WITH DIFFERENTIAL (CANCER CENTER ONLY)
Abs Immature Granulocytes: 0 K/uL (ref 0.00–0.07)
Basophils Absolute: 0 K/uL (ref 0.0–0.1)
Basophils Relative: 1 %
Eosinophils Absolute: 0.1 K/uL (ref 0.0–0.5)
Eosinophils Relative: 3 %
HCT: 39 % (ref 39.0–52.0)
Hemoglobin: 13.3 g/dL (ref 13.0–17.0)
Immature Granulocytes: 0 %
Lymphocytes Relative: 28 %
Lymphs Abs: 1.1 K/uL (ref 0.7–4.0)
MCH: 30.4 pg (ref 26.0–34.0)
MCHC: 34.1 g/dL (ref 30.0–36.0)
MCV: 89.2 fL (ref 80.0–100.0)
Monocytes Absolute: 0.8 K/uL (ref 0.1–1.0)
Monocytes Relative: 19 %
Neutro Abs: 2 K/uL (ref 1.7–7.7)
Neutrophils Relative %: 49 %
Platelet Count: 322 K/uL (ref 150–400)
RBC: 4.37 MIL/uL (ref 4.22–5.81)
RDW: 14.5 % (ref 11.5–15.5)
WBC Count: 4.1 K/uL (ref 4.0–10.5)
nRBC: 0 % (ref 0.0–0.2)

## 2024-04-07 LAB — TSH: TSH: 1.53 u[IU]/mL (ref 0.350–4.500)

## 2024-04-07 NOTE — Assessment & Plan Note (Signed)
 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  He is currently on Eliquis  5 mg p.o. twice daily.   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  If inconclusive, we may have to obtain a CT of the neck.  Depending on the results, we will determine if it is safe to discontinue Eliquis  and transition to aspirin 81 mg daily.

## 2024-04-07 NOTE — Progress Notes (Unsigned)
 "  [facility]  ONCOLOGY CLINIC PROGRESS NOTE   Patient Care Team: Tobie Suzzane POUR, MD as PCP - General (Internal Medicine) [name], MD (Inactive) as Consulting Physician (Gastroenterology) Izell Domino, MD as Attending Physician (Radiation Oncology) Malmfelt, Delon CROME, RN as Oncology Nurse Navigator Autumn Millman, MD as Consulting Physician (Oncology) Pa, Su Dois Moccasin Md as Referring Physician (Otolaryngology)  PATIENT NAME: [name]   MR#: [id number] DOB: 08/05/1970  Date of visit: 04/07/2024   ASSESSMENT & PLAN:   ENGLISH [name] is a 54 y.o. 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   Malignant neoplasm of supraglottis (HCC) Supraglottic cancer with one suspicious 7 mm lymph node on PET scan, classified as T3, N1, M0, stage III, low volume disease.   Discussed his case in our tumor conference on 04/14/2023.  Based on imaging studies, it is classified as cT3 lesion.  cN1.  Hence plan made to proceed with concurrent chemoradiation.  We discussed about role of cisplatin  being a radiosensitizer in the treatment of head and neck cancer.  We have discussed about the curative intent of chemoradiation for this patient.     -He began radiation treatments from 04/14/2023.  Began chemotherapy with cisplatin  from 04/19/2023 and completed 7 doses as of 06/08/2023.  Starting from cycle 6, we dose reduce cisplatin  to 30 mg/m because of cytopenias. He completed radiation treatments on 06/08/2023.  On 09/03/2023, restaging PET scan showed no evidence of disease.  Overall excellent response.  He is slowly recovering from chemoradiation related side effects.  Steadily making progress.  Labs today reveal white count of 3500 with normal differential.  Hemoglobin is now normal at 14.6.  Platelet count normal at  177,000.  Creatinine normal at 1.06.  No electrolyte disturbances.  We will submit request for removal of feeding tube and Port-A-Cath.  Going forward we will continue surveillance as per NCCN guidelines.  RTC in 6 months for follow-up with repeat labs and physical exam.  Internal jugular (IJ) vein thromboembolism, acute, right (/Acute DVT of Right IJV- acute occlusive thrombus throughout the right internal jugular vein. 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  He is currently on Eliquis  5 mg p.o. twice daily.   D-dimer remains mildly elevated at 1.37 today.  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  If inconclusive, we may have to obtain a CT of the neck.  Depending on the results, we will determine if it is safe to discontinue Eliquis  and transition to aspirin 81 mg daily.   Assessment and Plan Assessment & Plan     I reviewed lab results and outside records for this visit and discussed relevant results with the patient. Diagnosis, plan of care and treatment options were also discussed in detail with the patient. Opportunity provided to ask questions and answers provided to his apparent satisfaction. Provided instructions to call our clinic with any problems, questions or concerns prior to return visit. I recommended to continue follow-up with PCP and sub-specialists. He verbalized understanding and agreed with the plan.   NCCN guidelines have been consulted in the planning of this patients care.  I spent a total of 30 minutes during this encounter with the patient including review of chart and various tests results, discussions about plan of care and coordination  of care plan.   Chinita Patten, MD  04/07/2024 10:56 AM  [facility] - A DEPT OF [facility] 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   Current Treatment: Concurrent chemoradiation with weekly cisplatin .  Radiation started from 04/14/2023.  Cisplatin  started from 04/19/2023.  Completed all treatments on 06/08/2023.  INTERVAL HISTORY:    Discussed the use of AI scribe software for clinical note transcription with the patient, who gave verbal consent to proceed.  History of Present Illness [name] is a 54 year old male who presents with difficulty gaining weight and decreased appetite.  He has experienced significant weight loss, dropping from his usual weight of 148 pounds to a current weight of 124-128 pounds. This weight loss is primarily attributed to a decreased appetite and a lack of taste, as he states 'I don't have all of my taste buds back.' He is working with a nutritionist named Elvie and prefers to eat orally despite a reduced appetite, having discontinued tube feeds.  The feeding tube often caused gastrointestinal discomfort, as he states 'it just goes straight through me anyway' and sometimes made him feel nauseous.  He is currently on Eliquis , a blood thinner, following the incidental discovery of a blood clot on an ultrasound three months ago.  He underwent a PET scan last month, which showed no evidence of residual cancer. Recent blood work revealed normal hemoglobin and platelet levels, with a slightly low white blood cell count.  He reports slight difficulty swallowing but is able to swallow. No significant appetite and notes a lack of taste.   I have reviewed the past medical history, past surgical history, social history and family history with the patient and they are unchanged from previous note.  HISTORY OF PRESENT ILLNESS:   ONCOLOGY HISTORY:   He initially presented to the [facility] on 10/28/22 with c/o left ear pain, popping with chewing,  and pain radiating down the side of his neck and throat x 1 month. He was treated for a left ear infection with a course of prednisone .   His symptoms including left ear and throat pain however persisted and progressed and he was subsequently referred to Dr. Karis on 03/10/23 at [facility] for further evaluation. During which time, the patient detailed that his pain first began in the ear and eventually progressed to involve the left side of his throat.  A laryngoscopy was subsequently performed at that time which revealed an ulcerative lesion on the posterior aspect of the epiglottis, superior to the vocal cords. The arytenoid mucosa was also mildly edematous and the true vocal folds were pale yellow, mobile, and without mass or lesion.       He accordingly underwent a direct laryngoscopy with biopsies of the left supraglottic tumor/mass on 03/16/23. Pathology showed findings consistent with invasive and in situ moderately differentiated squamous cell carcinoma. Per note by Dr Karis, Examination of the vallecula, piriform sinuses, and the pharyngeal mucosa were all normal. A fungating mass was noted on the posterior aspect of the left epiglottis. The mass did not appear to involve the vocal cords.   Soft tissue neck CT and PET scan on 03/23/23 were done - demonstrating a left supraglottic lesion which may involve  the glottis as well as a 7 mm left level 3 lymph node that is suspicious for metastatic involvement.  No distant metastatic disease.  cT3,cN1,cM0, Stage III disease.   Plan made to proceed with concurrent chemoradiation with weekly cisplatin .  Received concurrent chemoradiation starting during the week of 04/14/2023 and completed treatments on 06/08/2023.  On 09/03/2023, restaging PET scan showed no evidence of disease.  Overall excellent response.  Oncology History  Malignant neoplasm of supraglottis (HCC)  03/22/2023 Initial Diagnosis   Malignant neoplasm of supraglottis (HCC)   03/26/2023  Cancer Staging   Staging form: Larynx - Supraglottis, AJCC 8th Edition - Clinical stage from 03/26/2023: Stage III (cT3, cN1, cM0) - Signed by Izell Domino, MD on 04/14/2023 Stage prefix: Initial diagnosis   04/19/2023 -  Chemotherapy   Patient is on Treatment Plan : HEAD/NECK Cisplatin  (40) q7d         REVIEW OF SYSTEMS:   Review of Systems - Oncology  All other pertinent systems were reviewed with the patient and are negative.  ALLERGIES: He has no known allergies.  MEDICATIONS:  Current Outpatient Medications  Medication Sig Dispense Refill   acetaminophen  (TYLENOL ) 500 MG tablet Take 1,000 mg by mouth at bedtime as needed for mild pain (pain score 1-3) or moderate pain (pain score 4-6).     amLODipine  (NORVASC ) 10 MG tablet Take 1 tablet (10 mg total) by mouth daily. 90 tablet 1   apixaban  (ELIQUIS ) 5 MG TABS tablet Take 5 mg by mouth in the morning and at bedtime 388 tablet 0   famotidine  (PEPCID ) 40 MG tablet Take 1 tablet (40 mg total) by mouth daily. 90 tablet 3   FLUoxetine  (PROZAC ) 20 MG capsule Take 1 capsule (20 mg total) by mouth daily. 90 capsule 1   metoprolol  tartrate (LOPRESSOR ) 25 MG tablet Take 0.5 tablets (12.5 mg total) by mouth 2 (two) times daily. 90 tablet 1   mirtazapine  (REMERON ) 7.5 MG tablet Take 1 tablet (7.5 mg total) by mouth at bedtime. 90 tablet 1   Misc. Devices MISC Blood pressure cuff/device - 1. ICD10: I10 1 each 0   QUEtiapine  (SEROQUEL ) 100 MG tablet Take 1 tablet (100 mg total) by mouth at bedtime. 90 tablet 1   Vitamin D , Ergocalciferol , (DRISDOL ) 1.25 MG (50000 UNIT) CAPS capsule Take 1 capsule (50,000 Units total) by mouth every 7 (seven) days. 12 capsule 1   No current facility-administered medications for this visit.     VITALS:   Blood pressure 134/74, pulse (!) 52, temperature 98.3 F (36.8 C), resp. rate 17, weight 119 lb (54 kg), SpO2 100%.  Wt Readings from Last 3 Encounters:  04/07/24 119 lb (54 kg)  03/17/24 117 lb 12.8 oz  (53.4 kg)  03/02/24 120 lb (54.4 kg)    Body mass index is 20.43 kg/m.   Onc Performance Status - 04/07/24 1019       ECOG Perf Status   ECOG Perf Status Fully active, able to carry on all pre-disease performance without restriction      KPS SCALE   KPS % SCORE Able to carry on normal activity, minor s/s of disease            PHYSICAL EXAM:   Physical Exam Constitutional:      General: He is not in acute distress.    Appearance: Normal appearance.  HENT:     Head: Normocephalic and atraumatic.     Mouth/Throat:     Comments: No evidence of erythema, ulcers  or other abnormal lesions. Eyes:     General: No scleral icterus.    Conjunctiva/sclera: Conjunctivae normal.  Neck:     Comments: Radiation associated skin changes have resolved Cardiovascular:     Rate and Rhythm: Normal rate and regular rhythm.     Heart sounds: Normal heart sounds.  Pulmonary:     Effort: Pulmonary effort is normal.     Breath sounds: Normal breath sounds.  Chest:     Comments: Port-A-Cath in place without any signs of infection Abdominal:     General: There is no distension.     Comments: Feeding tube in place, bandaged.  Musculoskeletal:     Right lower leg: No edema.     Left lower leg: No edema.  Neurological:     General: No focal deficit present.     Mental Status: He is alert and oriented to person, place, and time.  Psychiatric:        Mood and Affect: Mood normal.        Behavior: Behavior normal.        Thought Content: Thought content normal.       LABORATORY DATA:   I have reviewed the data as listed.  Results for orders placed or performed in visit on 04/07/24  CMP (Cancer Center only)  Result Value Ref Range   Sodium 141 135 - 145 mmol/L   Potassium 4.2 3.5 - 5.1 mmol/L   Chloride 105 98 - 111 mmol/L   CO2 26 22 - 32 mmol/L   Glucose, Bld 89 70 - 99 mg/dL   BUN 9 6 - 20 mg/dL   Creatinine 9.04 9.38 - 1.24 mg/dL   Calcium 9.2 8.9 - 89.6 mg/dL   Total  Protein 7.4 6.5 - 8.1 g/dL   Albumin 4.0 3.5 - 5.0 g/dL   AST 18 15 - 41 U/L   ALT 8 0 - 44 U/L   Alkaline Phosphatase 60 38 - 126 U/L   Total Bilirubin 0.7 0.0 - 1.2 mg/dL   GFR, Estimated >39 >39 mL/min   Anion gap 10 5 - 15  CBC with Differential (Cancer Center Only)  Result Value Ref Range   WBC Count 4.1 4.0 - 10.5 K/uL   RBC 4.37 4.22 - 5.81 MIL/uL   Hemoglobin 13.3 13.0 - 17.0 g/dL   HCT 60.9 60.9 - 47.9 %   MCV 89.2 80.0 - 100.0 fL   MCH 30.4 26.0 - 34.0 pg   MCHC 34.1 30.0 - 36.0 g/dL   RDW 85.4 88.4 - 84.4 %   Platelet Count 322 150 - 400 K/uL   nRBC 0.0 0.0 - 0.2 %   Neutrophils Relative % 49 %   Neutro Abs 2.0 1.7 - 7.7 K/uL   Lymphocytes Relative 28 %   Lymphs Abs 1.1 0.7 - 4.0 K/uL   Monocytes Relative 19 %   Monocytes Absolute 0.8 0.1 - 1.0 K/uL   Eosinophils Relative 3 %   Eosinophils Absolute 0.1 0.0 - 0.5 K/uL   Basophils Relative 1 %   Basophils Absolute 0.0 0.0 - 0.1 K/uL   Immature Granulocytes 0 %   Abs Immature Granulocytes 0.00 0.00 - 0.07 K/uL     RADIOGRAPHIC STUDIES:  No recent pertinent imaging available to review.   CODE STATUS:  Code Status History     Date Active Date Inactive Code Status Order ID Comments User Context   06/23/2017 1540 06/28/2017 2102 Full Code [id number]  Janifer Mitzie Retort, NP Inpatient  06/23/2017 0327 06/23/2017 1502 Full Code [id number]  Merilee Harlene KATHEE [facility]   01/13/2017 0858 01/13/2017 1329 Full Code [id number]  [name], MD Inpatient   11/05/2016 0018 11/10/2016 1937 Full Code [id number]  Ray Jacques BRAVO, PA-C Inpatient       No orders of the defined types were placed in this encounter.    Future Appointments  Date Time Provider Department Center  04/12/2024 12:30 PM [facility]-ANNUAL WELLNESS VISIT [facility] [address]  08/08/2024 11:00 AM Wyatt Leeroy HERO, PA-C [facility] None  08/31/2024  4:00 PM [name], MD [facility] None    This document was completed utilizing speech recognition software.  Grammatical errors, random word insertions, pronoun errors, and incomplete sentences are an occasional consequence of this system due to software limitations, ambient noise, and hardware issues. Any formal questions or concerns about the content, text or information contained within the body of this dictation should be directly addressed to the provider for clarification.   "

## 2024-04-07 NOTE — Assessment & Plan Note (Signed)
 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   Discussed his case in our tumor conference on 04/14/2023.  Based on imaging studies, it is classified as cT3 lesion.  cN1.  Hence plan made to proceed with concurrent chemoradiation.  We discussed about role of cisplatin  being a radiosensitizer in the treatment of head and neck cancer.  We have discussed about the curative intent of chemoradiation for this patient.     -He began radiation treatments from 04/14/2023.  Began chemotherapy with cisplatin  from 04/19/2023 and completed 7 doses as of 06/08/2023.  Starting from cycle 6, we dose reduce cisplatin  to 30 mg/m because of cytopenias. He completed radiation treatments on 06/08/2023.  On 09/03/2023, restaging PET scan showed no evidence of disease.  Overall excellent response.  He is slowly recovering from chemoradiation related side effects.  Steadily making progress.  Labs today reveal white count of 3500 with normal differential.  Hemoglobin is now normal at 14.6.  Platelet count normal at 177,000.  Creatinine normal at 1.06.  No electrolyte disturbances.  We will submit request for removal of feeding tube and Port-A-Cath.  Going forward we will continue surveillance as per NCCN guidelines.  RTC in 6 months for follow-up with repeat labs and physical exam.

## 2024-04-08 ENCOUNTER — Other Ambulatory Visit: Payer: Self-pay

## 2024-04-10 ENCOUNTER — Encounter: Payer: Self-pay | Admitting: Oncology

## 2024-04-12 ENCOUNTER — Ambulatory Visit: Payer: Self-pay

## 2024-04-24 ENCOUNTER — Ambulatory Visit: Payer: Medicare (Managed Care)

## 2024-05-02 ENCOUNTER — Ambulatory Visit

## 2024-05-02 ENCOUNTER — Telehealth: Payer: Self-pay | Admitting: Internal Medicine

## 2024-05-03 ENCOUNTER — Ambulatory Visit: Payer: Medicare (Managed Care)

## 2024-05-03 VITALS — BP 116/70 | HR 77 | Resp 14 | Ht 64.0 in | Wt 125.1 lb

## 2024-05-03 DIAGNOSIS — Z0000 Encounter for general adult medical examination without abnormal findings: Secondary | ICD-10-CM | POA: Diagnosis not present

## 2024-05-03 DIAGNOSIS — Z23 Encounter for immunization: Secondary | ICD-10-CM

## 2024-05-03 NOTE — Patient Instructions (Signed)
 Mr. Maceachern,  Thank you for taking the time for your Medicare Wellness Visit. I appreciate your continued commitment to your health goals. Please review the care plan we discussed, and feel free to reach out if I can assist you further.  Please note that Annual Wellness Visits do not include a physical exam. Some assessments may be limited, especially if the visit was conducted virtually. If needed, we may recommend an in-person follow-up with your provider.  Ongoing Care Seeing your primary care provider every 3 to 6 months helps us  monitor your health and provide consistent, personalized care.   Referrals If a referral was made during today's visit and you haven't received any updates within two weeks, please contact the referred provider directly to check on the status.  Recommended Screenings:  Health Maintenance  Topic Date Due   Medicare Annual Wellness Visit  Never done   COVID-19 Vaccine (1) Never done   Pneumococcal Vaccine for age over 87 (1 of 2 - PCV) Never done   Zoster (Shingles) Vaccine (1 of 2) Never done   Hepatitis B Vaccine (2 of 3 - 19+ 3-dose series) 09/22/2019   Colon Cancer Screening  01/06/2027   DTaP/Tdap/Td vaccine (2 - Td or Tdap) 08/20/2029   Flu Shot  Completed   HPV Vaccine (No Doses Required) Completed   Hepatitis C Screening  Completed   HIV Screening  Completed   Meningitis B Vaccine  Aged Out       04/07/2024   10:19 AM  Advanced Directives  Does Patient Have a Medical Advance Directive? No  Would patient like information on creating a medical advance directive? No - Patient declined    Vision: Annual vision screenings are recommended for early detection of glaucoma, cataracts, and diabetic retinopathy. These exams can also reveal signs of chronic conditions such as diabetes and high blood pressure.  Dental: Annual dental screenings help detect early signs of oral cancer, gum disease, and other conditions linked to overall health, including heart  disease and diabetes.  Please see the attached documents for additional preventive care recommendations.  There are several Eye Doctors in your area. Here are a few that usually accept all insurance types:  What's the difference between an ophthalmologist and an optometrist?    An ophthalmologist has a medical degree (MD or DO) and complete a four year residency in ophthalmology. They can diagnose/treat a wide range of eye conditions and can perform surgical interventions such as cataract surgery and glaucoma surgery  Optometrists have a Doctor of Optometry (OD) and complete a four year optometry residency.They focus on primary eye care such as vision exams, contact lens fittings, and treatment of common eye diseases.   [facility]  (OPHTHALMOLOGY) [address] [place], [place] [address] Phone: [phone]  [facility]  (OPHTHALMOLOGY) [address] [place] [address] [place] [address] Phone: [phone]  [facility] OFFICE)   (2 LOCATIONS LISTED BELOW)  [address] [place], [place] [address] Phone: [phone]  OR [address] [place], [place] [address] Phone: [phone]  [facility] OFFICE) (3 LOCATIONS LISTED BELOW)  [address] [place], [place] [address] Phone: [phone]  [address] [place] [facility] [place], [place] [address] Phone: [phone]  [address] [place], [place] [address] Phone: [phone]

## 2024-05-03 NOTE — Progress Notes (Signed)
 "  Chief Complaint  Patient presents with   Medicare Wellness     Subjective:   [name] is a 54 y.o. male who presents for a Medicare Annual Wellness Visit.  Visit info / Clinical Intake: Medicare Wellness Visit Type:: Initial Annual Wellness Visit Persons participating in visit and providing information:: patient Medicare Wellness Visit Mode:: In-person (required for WTM) Interpreter Needed?: No Pre-visit prep was completed: yes AWV questionnaire completed by patient prior to visit?: yes Date:: 05/02/24 Living arrangements:: (Patient-Rptd) with family/others Patient's Overall Health Status Rating: (!) (Patient-Rptd) poor Typical amount of pain: (Patient-Rptd) some Does pain affect daily life?: (!) (Patient-Rptd) yes Are you currently prescribed opioids?: no  Dietary Habits and Nutritional Risks How many meals a day?: (Patient-Rptd) 2 Eats fruit and vegetables daily?: (!) (Patient-Rptd) no Most meals are obtained by: (Patient-Rptd) eating out; having others provide food In the last 2 weeks, have you had any of the following?: (!) nausea, vomiting, diarrhea (due to cancer treatment) Diabetic:: no  Functional Status Activities of Daily Living (to include ambulation/medication): Independent Ambulation: Independent Medication Administration: Independent Home Management (perform basic housework or laundry): (Patient-Rptd) Independent Manage your own finances?: (Patient-Rptd) yes Primary transportation is: (Patient-Rptd) family / friends Concerns about vision?: (!) (Patient-Rptd) yes Concerns about hearing?: (Patient-Rptd) no  Fall Screening Falls in the past year?: (Patient-Rptd) 0 Number of falls in past year: 0 Was there an injury with Fall?: 0 Fall Risk Category Calculator: 0 Patient Fall Risk Level: Low Fall Risk  Fall Risk Patient at Risk for Falls Due to: No Fall Risks Fall risk Follow up: Falls evaluation completed; Education provided; Falls prevention  discussed  Home and Transportation Safety: All rugs have non-skid backing?: (!) (Patient-Rptd) no All stairs or steps have railings?: (Patient-Rptd) N/A, no stairs Grab bars in the bathtub or shower?: (!) (Patient-Rptd) no Have non-skid surface in bathtub or shower?: (!) (Patient-Rptd) no Good home lighting?: (Patient-Rptd) yes Regular seat belt use?: (Patient-Rptd) yes Hospital stays in the last year:: (!) (Patient-Rptd) yes How many hospital stays:: (Patient-Rptd) 2  Cognitive Assessment Difficulty concentrating, remembering, or making decisions? : (Patient-Rptd) no Will 6CIT or Mini Cog be Completed: yes What year is it?: 0 points What month is it?: 0 points Give patient an address phrase to remember (5 components): [address] [place] About what time is it?: 0 points Count backwards from 20 to 1: 0 points Say the months of the year in reverse: 0 points Repeat the address phrase from earlier: 0 points 6 CIT Score: 0 points  Advance Directives (For Healthcare) Does Patient Have a Medical Advance Directive?: No Would patient like information on creating a medical advance directive?: Yes (MAU/Ambulatory/Procedural Areas - Information given)  Reviewed/Updated  Reviewed/Updated: Reviewed All (Medical, Surgical, Family, Medications, Allergies, Care Teams, Patient Goals)    Allergies (verified) Patient has no known allergies.   Current Medications (verified) Outpatient Encounter Medications as of 05/03/2024  Medication Sig   acetaminophen  (TYLENOL ) 500 MG tablet Take 1,000 mg by mouth at bedtime as needed for mild pain (pain score 1-3) or moderate pain (pain score 4-6).   amLODipine  (NORVASC ) 10 MG tablet Take 1 tablet (10 mg total) by mouth daily.   apixaban  (ELIQUIS ) 5 MG TABS tablet Take 5 mg by mouth in the morning and at bedtime   famotidine  (PEPCID ) 40 MG tablet Take 1 tablet (40 mg total) by mouth daily.   FLUoxetine  (PROZAC ) 20 MG capsule Take 1 capsule (20 mg total)  by mouth daily.  metoprolol  tartrate (LOPRESSOR ) 25 MG tablet Take 0.5 tablets (12.5 mg total) by mouth 2 (two) times daily.   mirtazapine  (REMERON ) 7.5 MG tablet Take 1 tablet (7.5 mg total) by mouth at bedtime.   Misc. Devices MISC Blood pressure cuff/device - 1. ICD10: I10   QUEtiapine  (SEROQUEL ) 100 MG tablet Take 1 tablet (100 mg total) by mouth at bedtime.   Vitamin D , Ergocalciferol , (DRISDOL ) 1.25 MG (50000 UNIT) CAPS capsule Take 1 capsule (50,000 Units total) by mouth every 7 (seven) days.   No facility-administered encounter medications on file as of 05/03/2024.    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   Substance induced mood disorder (HCC) 11/05/2016   Syncope 05/21/2023   Past Surgical History:  Procedure Laterality Date   ANEURYSM COILING     AGE 32 x2   APPENDECTOMY     BIOPSY  01/05/2017   Procedure: BIOPSY;  Surgeon: [name], MD;  [facility];  Service: Endoscopy;;  Duodenal and Gastric   COLONOSCOPY WITH PROPOFOL  N/A 01/05/2017   Procedure: COLONOSCOPY WITH PROPOFOL ;  Surgeon: [name], MD;  Location: [facility];  Service: Endoscopy;  Laterality: N/A;  10:00am   DIRECT LARYNGOSCOPY Left 03/16/2023   Procedure: Direct laryngoscopy with biopsy of supraglottic mass;  Surgeon: [name], MD;  Location: [facility];  Service: ENT;  Laterality: Left;   ESOPHAGOGASTRODUODENOSCOPY (EGD) WITH PROPOFOL  N/A 01/05/2017   Procedure: ESOPHAGOGASTRODUODENOSCOPY (EGD) WITH  PROPOFOL ;  Surgeon: [name], MD;  Location: [facility];  Service: Endoscopy;  Laterality: N/A;   IR GASTROSTOMY TUBE MOD SED  04/28/2023   IR GASTROSTOMY TUBE REMOVAL  10/14/2023   IR IMAGING GUIDED PORT INSERTION  04/28/2023   IR PATIENT EVAL TECH 0-60 MINS  05/03/2023   IR REMOVAL TUN ACCESS W/ PORT W/O FL MOD SED  10/14/2023   LAPAROSCOPIC APPENDECTOMY     MASS EXCISION Right 01/13/2017   Procedure: EXCISION LIPOMA RIGHT ARM;  Surgeon: [name], MD;  Location: [facility];  Service: General;  Laterality: Right;   Family History  Problem Relation Age of Onset   Cancer Mother        Lung   Stomach cancer Mother    Cancer Sister        Bone cancer   Bone cancer Sister 49   Cancer Brother        3 brothers with prostate cancer   Colon cancer Brother 38   Colon polyps Neg Hx    Social History   Occupational History   Not on file  Tobacco Use   Smoking status: Former    Current packs/day: 0.25    Average packs/day: 0.3 packs/day for 25.0 years (6.3 ttl pk-yrs)    Types: Cigarettes   Smokeless tobacco: Never   Tobacco comments:    More than 2 packs/day as of 03/18/2023 but quit around the new year  Vaping Use   Vaping status: Every Day   Substances: Nicotine   Substance and Sexual Activity   Alcohol use: Yes    Alcohol/week: 1.0 standard drink of alcohol    Types: 1 Cans of beer per week    Comment: 1 beer at a time when consuming but unclear how frequent.   Drug use: Not Currently    Types: Crack cocaine, Cocaine, Marijuana    Comment: last positive in 2020.  Ssta used last 2 months ago.   Sexual activity: Yes    Birth control/protection: Condom   Tobacco Counseling Counseling given: Yes Tobacco comments: More than 2 packs/day as of 03/18/2023 but quit around the new year  SDOH Screenings   Food Insecurity: No Food Insecurity (05/03/2024)  Housing: Low Risk (05/03/2024)  Transportation Needs: No Transportation Needs (05/03/2024)  Utilities: Not At Risk  (05/03/2024)  Alcohol Screen: Medium Risk (11/22/2023)  Depression (PHQ2-9): Medium Risk (05/03/2024)  Financial Resource Strain: High Risk (11/22/2023)  Physical Activity: Inactive (05/03/2024)  Social Connections: Moderately Isolated (05/03/2024)  Stress: No Stress Concern Present (05/03/2024)  Tobacco Use: Medium Risk (05/03/2024)  Health Literacy: Adequate Health Literacy (05/03/2024)   See flowsheets for full screening details  Depression Screen PHQ 2 & 9 Depression Scale- Over the past 2 weeks, how often have you been bothered by any of the following problems? Little interest or pleasure in doing things: 0 Feeling down, depressed, or hopeless (PHQ Adolescent also includes...irritable): 3 PHQ-2 Total Score: 3 Trouble falling or staying asleep, or sleeping too much: 0 Feeling tired or having little energy: 2 Poor appetite or overeating (PHQ Adolescent also includes...weight loss): 3 (due to side effects of chemo and radiation) Feeling bad about yourself - or that you are a failure or have let yourself or your family down: 1 Trouble concentrating on things, such as reading the newspaper or watching television (PHQ Adolescent also includes...like school work): 0 Moving or speaking so slowly that other people could have noticed. Or the opposite - being so fidgety or restless that you have been moving around a lot more than usual: 0 Thoughts that you would be better off dead, or of hurting yourself in some way: 0 PHQ-9 Total Score: 9 If you checked off any problems, how difficult have these problems made it for you to do your work, take care of things at home, or get along with other people?: Very difficult  Depression Treatment Depression Interventions/Treatment : Currently on Treatment     Goals Addressed               This Visit's Progress     I want to gain the weight I lost chemotherapy and radiation (pt-stated)        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 Has been told  he is in remission             Objective:    Today's Vitals   05/03/24 1309 05/03/24 1311  BP: (!) 145/88 116/70  Pulse: 77   Resp: 14   SpO2: 97%   Weight: 125 lb 1.9 oz (56.8 kg)   Height: 5' 4 (1.626 m)    Body mass index is 21.48 kg/m.  Hearing/Vision screen Hearing Screening - Comments:: Patient denies any hearing difficulties.   Vision Screening - Comments:: Patient does not have an eye doctor. A list of eye doctors has been provided to the patient.   Immunizations and Health Maintenance Health Maintenance  Topic Date Due   Medicare  Annual Wellness (AWV)  Never done   COVID-19 Vaccine (1) Never done   Pneumococcal Vaccine: 50+ Years (1 of 2 - PCV) Never done   Zoster Vaccines- Shingrix (1 of 2) Never done   Hepatitis B Vaccines 19-59 Average Risk (2 of 3 - 19+ 3-dose series) 09/22/2019   Colonoscopy  01/06/2027   DTaP/Tdap/Td (2 - Td or Tdap) 08/20/2029   Influenza Vaccine  Completed   HPV VACCINES (No Doses Required) Completed   Hepatitis C Screening  Completed   HIV Screening  Completed   Meningococcal B Vaccine  Aged Out        Assessment/Plan:  This is a routine wellness examination for Ashkan.  Patient Care Team: Tobie Suzzane POUR, MD as PCP - General (Internal Medicine) Izell Domino, MD as Attending Physician (Radiation Oncology) Malmfelt, Delon CROME, RN as Oncology Nurse Navigator Autumn Millman, MD as Consulting Physician (Oncology) Pa, Su Dois Moccasin Md as Referring Physician (Otolaryngology) [facility] (Psychiatry)  I have personally reviewed and noted the following in the patients chart:   Medical and social history Use of alcohol, tobacco or illicit drugs  Current medications and supplements including opioid prescriptions. Functional ability and status Nutritional status Physical activity Advanced directives List of other physicians Hospitalizations, surgeries, and ER visits in previous 12  months Vitals Screenings to include cognitive, depression, and falls Referrals and appointments  No orders of the defined types were placed in this encounter.  In addition, I have reviewed and discussed with patient certain preventive protocols, quality metrics, and best practice recommendations. A written personalized care plan for preventive services as well as general preventive health recommendations were provided to patient.   Fletcher Rathbun, CMA   05/03/2024   Return May 04, 2025 at 11:20 am, for In office Medicare Well Visit w  Wellness Nurse.  After Visit Summary: (In Person-Printed) AVS printed and given to the patient   "

## 2024-05-04 ENCOUNTER — Other Ambulatory Visit: Payer: Self-pay

## 2024-05-05 NOTE — Telephone Encounter (Signed)
 AWV completed on May 03, 2024.SABRASABRASABRAAbby W

## 2024-05-10 ENCOUNTER — Ambulatory Visit: Payer: Medicare (Managed Care)

## 2024-08-08 ENCOUNTER — Ambulatory Visit: Admitting: Radiology

## 2024-08-31 ENCOUNTER — Ambulatory Visit (INDEPENDENT_AMBULATORY_CARE_PROVIDER_SITE_OTHER): Admitting: Otolaryngology

## 2024-10-05 ENCOUNTER — Inpatient Hospital Stay: Payer: Medicare (Managed Care) | Admitting: Oncology

## 2024-10-05 ENCOUNTER — Inpatient Hospital Stay: Payer: Medicare (Managed Care)

## 2025-05-04 ENCOUNTER — Ambulatory Visit: Payer: Self-pay
